# Patient Record
Sex: Male | Born: 1945 | Race: Black or African American | Hispanic: No | Marital: Single | Smoking: Never smoker
Health system: Southern US, Community
[De-identification: ages and names within clinical notes are randomized; demographics above are authoritative.]

## PROBLEM LIST (undated history)

## (undated) DIAGNOSIS — A0472 Enterocolitis due to Clostridium difficile, not specified as recurrent: Principal | ICD-10-CM

## (undated) DIAGNOSIS — Z992 Dependence on renal dialysis: Secondary | ICD-10-CM

## (undated) DIAGNOSIS — I441 Atrioventricular block, second degree: Secondary | ICD-10-CM

## (undated) DIAGNOSIS — I2510 Atherosclerotic heart disease of native coronary artery without angina pectoris: Secondary | ICD-10-CM

## (undated) DIAGNOSIS — M79606 Pain in leg, unspecified: Secondary | ICD-10-CM

## (undated) DIAGNOSIS — E11319 Type 2 diabetes mellitus with unspecified diabetic retinopathy without macular edema: Secondary | ICD-10-CM

## (undated) DIAGNOSIS — I442 Atrioventricular block, complete: Secondary | ICD-10-CM

## (undated) DIAGNOSIS — G8929 Other chronic pain: Secondary | ICD-10-CM

## (undated) DIAGNOSIS — I219 Acute myocardial infarction, unspecified: Secondary | ICD-10-CM

## (undated) DIAGNOSIS — M549 Dorsalgia, unspecified: Secondary | ICD-10-CM

## (undated) DIAGNOSIS — D696 Thrombocytopenia, unspecified: Secondary | ICD-10-CM

## (undated) DIAGNOSIS — K529 Noninfective gastroenteritis and colitis, unspecified: Secondary | ICD-10-CM

## (undated) DIAGNOSIS — I255 Ischemic cardiomyopathy: Secondary | ICD-10-CM

## (undated) DIAGNOSIS — E669 Obesity, unspecified: Secondary | ICD-10-CM

## (undated) DIAGNOSIS — I739 Peripheral vascular disease, unspecified: Secondary | ICD-10-CM

## (undated) DIAGNOSIS — N186 End stage renal disease: Secondary | ICD-10-CM

## (undated) DIAGNOSIS — I96 Gangrene, not elsewhere classified: Secondary | ICD-10-CM

## (undated) DIAGNOSIS — I10 Essential (primary) hypertension: Secondary | ICD-10-CM

## (undated) DIAGNOSIS — D649 Anemia, unspecified: Secondary | ICD-10-CM

## (undated) HISTORY — PX: ARTERIOVENOUS GRAFT PLACEMENT: SUR1029

## (undated) HISTORY — PX: EYE SURGERY: SHX253

## (undated) HISTORY — PX: PR VEIN BYPASS GRAFT,AORTO-FEM-POP: 35551

## (undated) HISTORY — DX: Obesity, unspecified: E66.9

## (undated) HISTORY — PX: AV FISTULA PLACEMENT: SHX1204

## (undated) HISTORY — DX: Enterocolitis due to Clostridium difficile, not specified as recurrent: A04.72

## (undated) HISTORY — DX: Peripheral vascular disease, unspecified: I73.9

## (undated) HISTORY — DX: Type 2 diabetes mellitus with unspecified diabetic retinopathy without macular edema: E11.319

## (undated) HISTORY — PX: ABOVE KNEE LEG AMPUTATION: SUR20

## (undated) HISTORY — PX: CATARACT EXTRACTION: SUR2

## (undated) HISTORY — DX: Essential (primary) hypertension: I10

---

## 2001-05-12 ENCOUNTER — Encounter: Payer: Self-pay | Admitting: Vascular Surgery

## 2001-05-14 ENCOUNTER — Encounter: Payer: Self-pay | Admitting: Vascular Surgery

## 2001-05-14 ENCOUNTER — Inpatient Hospital Stay (HOSPITAL_COMMUNITY): Admission: AD | Admit: 2001-05-14 | Discharge: 2001-06-08 | Payer: Self-pay | Admitting: Vascular Surgery

## 2001-05-14 ENCOUNTER — Encounter (INDEPENDENT_AMBULATORY_CARE_PROVIDER_SITE_OTHER): Payer: Self-pay | Admitting: *Deleted

## 2001-05-16 ENCOUNTER — Encounter: Payer: Self-pay | Admitting: Vascular Surgery

## 2003-05-29 ENCOUNTER — Encounter: Admission: RE | Admit: 2003-05-29 | Discharge: 2003-05-29 | Payer: Self-pay | Admitting: Vascular Surgery

## 2003-06-11 ENCOUNTER — Inpatient Hospital Stay (HOSPITAL_COMMUNITY): Admission: RE | Admit: 2003-06-11 | Discharge: 2003-06-17 | Payer: Self-pay | Admitting: Vascular Surgery

## 2003-06-13 ENCOUNTER — Encounter (INDEPENDENT_AMBULATORY_CARE_PROVIDER_SITE_OTHER): Payer: Self-pay | Admitting: Specialist

## 2005-03-31 ENCOUNTER — Ambulatory Visit: Payer: Self-pay | Admitting: Ophthalmology

## 2005-04-07 ENCOUNTER — Ambulatory Visit: Payer: Self-pay | Admitting: Ophthalmology

## 2006-03-23 ENCOUNTER — Ambulatory Visit: Payer: Self-pay | Admitting: Gastroenterology

## 2006-04-09 HISTORY — PX: COLONOSCOPY: SHX174

## 2006-05-05 ENCOUNTER — Ambulatory Visit: Payer: Self-pay | Admitting: Internal Medicine

## 2006-05-05 ENCOUNTER — Encounter (INDEPENDENT_AMBULATORY_CARE_PROVIDER_SITE_OTHER): Payer: Self-pay | Admitting: Specialist

## 2006-05-05 ENCOUNTER — Ambulatory Visit (HOSPITAL_COMMUNITY): Admission: RE | Admit: 2006-05-05 | Discharge: 2006-05-05 | Payer: Self-pay | Admitting: Internal Medicine

## 2006-10-05 ENCOUNTER — Encounter (HOSPITAL_COMMUNITY): Admission: RE | Admit: 2006-10-05 | Discharge: 2006-11-04 | Payer: Self-pay | Admitting: Nephrology

## 2006-10-05 ENCOUNTER — Ambulatory Visit (HOSPITAL_COMMUNITY): Payer: Self-pay | Admitting: Nephrology

## 2006-11-09 ENCOUNTER — Ambulatory Visit (HOSPITAL_COMMUNITY): Admission: RE | Admit: 2006-11-09 | Discharge: 2006-11-09 | Payer: Self-pay | Admitting: Vascular Surgery

## 2006-11-09 ENCOUNTER — Ambulatory Visit: Payer: Self-pay | Admitting: Vascular Surgery

## 2006-11-10 ENCOUNTER — Emergency Department (HOSPITAL_COMMUNITY): Admission: EM | Admit: 2006-11-10 | Discharge: 2006-11-10 | Payer: Self-pay | Admitting: Emergency Medicine

## 2006-12-31 ENCOUNTER — Emergency Department (HOSPITAL_COMMUNITY): Admission: EM | Admit: 2006-12-31 | Discharge: 2006-12-31 | Payer: Self-pay | Admitting: Emergency Medicine

## 2007-01-10 ENCOUNTER — Ambulatory Visit: Payer: Self-pay | Admitting: Vascular Surgery

## 2007-01-18 ENCOUNTER — Ambulatory Visit: Payer: Self-pay | Admitting: Vascular Surgery

## 2007-01-18 ENCOUNTER — Ambulatory Visit (HOSPITAL_COMMUNITY): Admission: RE | Admit: 2007-01-18 | Discharge: 2007-01-18 | Payer: Self-pay | Admitting: Vascular Surgery

## 2007-03-06 ENCOUNTER — Inpatient Hospital Stay (HOSPITAL_COMMUNITY): Admission: EM | Admit: 2007-03-06 | Discharge: 2007-03-10 | Payer: Self-pay | Admitting: Emergency Medicine

## 2007-03-09 ENCOUNTER — Other Ambulatory Visit: Payer: Self-pay | Admitting: Surgery

## 2007-03-09 ENCOUNTER — Ambulatory Visit: Payer: Self-pay | Admitting: Surgery

## 2007-03-21 ENCOUNTER — Ambulatory Visit (HOSPITAL_COMMUNITY): Admission: RE | Admit: 2007-03-21 | Discharge: 2007-03-21 | Payer: Self-pay | Admitting: Ophthalmology

## 2007-04-18 ENCOUNTER — Ambulatory Visit (HOSPITAL_COMMUNITY): Admission: RE | Admit: 2007-04-18 | Discharge: 2007-04-18 | Payer: Self-pay | Admitting: Ophthalmology

## 2007-05-15 ENCOUNTER — Emergency Department (HOSPITAL_COMMUNITY): Admission: EM | Admit: 2007-05-15 | Discharge: 2007-05-15 | Payer: Self-pay | Admitting: Emergency Medicine

## 2007-05-22 ENCOUNTER — Inpatient Hospital Stay (HOSPITAL_COMMUNITY): Admission: EM | Admit: 2007-05-22 | Discharge: 2007-05-30 | Payer: Self-pay | Admitting: Emergency Medicine

## 2007-05-25 ENCOUNTER — Ambulatory Visit: Payer: Self-pay | Admitting: Cardiology

## 2007-05-26 ENCOUNTER — Encounter (INDEPENDENT_AMBULATORY_CARE_PROVIDER_SITE_OTHER): Payer: Self-pay | Admitting: General Surgery

## 2007-05-30 ENCOUNTER — Inpatient Hospital Stay (HOSPITAL_COMMUNITY)
Admission: RE | Admit: 2007-05-30 | Discharge: 2007-06-09 | Payer: Self-pay | Admitting: Physical Medicine & Rehabilitation

## 2007-05-30 ENCOUNTER — Ambulatory Visit: Payer: Self-pay | Admitting: Physical Medicine & Rehabilitation

## 2007-06-30 ENCOUNTER — Emergency Department (HOSPITAL_COMMUNITY): Admission: EM | Admit: 2007-06-30 | Discharge: 2007-06-30 | Payer: Self-pay | Admitting: Emergency Medicine

## 2007-07-11 ENCOUNTER — Encounter
Admission: RE | Admit: 2007-07-11 | Discharge: 2007-07-12 | Payer: Self-pay | Admitting: Physical Medicine & Rehabilitation

## 2007-07-11 ENCOUNTER — Ambulatory Visit: Payer: Self-pay | Admitting: Physical Medicine & Rehabilitation

## 2007-07-12 ENCOUNTER — Encounter
Admission: RE | Admit: 2007-07-12 | Discharge: 2007-07-13 | Payer: Self-pay | Admitting: Physical Medicine & Rehabilitation

## 2007-07-19 ENCOUNTER — Ambulatory Visit (HOSPITAL_COMMUNITY): Admission: RE | Admit: 2007-07-19 | Discharge: 2007-07-19 | Payer: Self-pay | Admitting: Nephrology

## 2007-10-04 ENCOUNTER — Ambulatory Visit (HOSPITAL_COMMUNITY): Admission: RE | Admit: 2007-10-04 | Discharge: 2007-10-04 | Payer: Self-pay

## 2007-10-09 ENCOUNTER — Ambulatory Visit (HOSPITAL_COMMUNITY): Admission: RE | Admit: 2007-10-09 | Discharge: 2007-10-09 | Payer: Self-pay | Admitting: Nephrology

## 2007-11-17 ENCOUNTER — Encounter (HOSPITAL_COMMUNITY): Admission: RE | Admit: 2007-11-17 | Discharge: 2007-12-17 | Payer: Self-pay | Admitting: Internal Medicine

## 2007-12-20 ENCOUNTER — Encounter (HOSPITAL_COMMUNITY): Admission: RE | Admit: 2007-12-20 | Discharge: 2008-01-05 | Payer: Self-pay | Admitting: Internal Medicine

## 2008-07-01 ENCOUNTER — Emergency Department (HOSPITAL_COMMUNITY): Admission: EM | Admit: 2008-07-01 | Discharge: 2008-07-02 | Payer: Self-pay | Admitting: Emergency Medicine

## 2008-08-18 ENCOUNTER — Emergency Department (HOSPITAL_COMMUNITY): Admission: EM | Admit: 2008-08-18 | Discharge: 2008-08-18 | Payer: Self-pay | Admitting: Emergency Medicine

## 2008-08-23 ENCOUNTER — Ambulatory Visit (HOSPITAL_COMMUNITY): Admission: RE | Admit: 2008-08-23 | Discharge: 2008-08-23 | Payer: Self-pay | Admitting: Internal Medicine

## 2008-09-03 ENCOUNTER — Emergency Department (HOSPITAL_COMMUNITY): Admission: EM | Admit: 2008-09-03 | Discharge: 2008-09-03 | Payer: Self-pay | Admitting: Emergency Medicine

## 2008-10-09 ENCOUNTER — Encounter: Admission: RE | Admit: 2008-10-09 | Discharge: 2009-01-01 | Payer: Self-pay | Admitting: Internal Medicine

## 2009-11-17 ENCOUNTER — Encounter: Admission: RE | Admit: 2009-11-17 | Discharge: 2010-02-02 | Payer: Self-pay | Admitting: Internal Medicine

## 2010-02-08 ENCOUNTER — Emergency Department (HOSPITAL_COMMUNITY): Admission: EM | Admit: 2010-02-08 | Discharge: 2010-02-08 | Payer: Self-pay | Admitting: Emergency Medicine

## 2010-03-19 ENCOUNTER — Ambulatory Visit
Admission: RE | Admit: 2010-03-19 | Discharge: 2010-03-19 | Payer: Self-pay | Source: Home / Self Care | Admitting: Internal Medicine

## 2010-04-21 ENCOUNTER — Emergency Department (HOSPITAL_COMMUNITY)
Admission: EM | Admit: 2010-04-21 | Discharge: 2010-04-21 | Payer: Self-pay | Source: Home / Self Care | Admitting: Emergency Medicine

## 2010-07-20 LAB — BASIC METABOLIC PANEL
Calcium: 10 mg/dL (ref 8.4–10.5)
Creatinine, Ser: 7.49 mg/dL — ABNORMAL HIGH (ref 0.4–1.5)

## 2010-07-23 LAB — DIFFERENTIAL
Eosinophils Absolute: 0.1 10*3/uL (ref 0.0–0.7)
Lymphocytes Relative: 15 % (ref 12–46)
Lymphs Abs: 1.4 10*3/uL (ref 0.7–4.0)
Monocytes Relative: 7 % (ref 3–12)
Neutrophils Relative %: 77 % (ref 43–77)

## 2010-07-23 LAB — COMPREHENSIVE METABOLIC PANEL
Albumin: 3.3 g/dL — ABNORMAL LOW (ref 3.5–5.2)
Alkaline Phosphatase: 88 U/L (ref 39–117)
BUN: 45 mg/dL — ABNORMAL HIGH (ref 6–23)
Potassium: 5.2 mEq/L — ABNORMAL HIGH (ref 3.5–5.1)
Sodium: 135 mEq/L (ref 135–145)
Total Protein: 7.4 g/dL (ref 6.0–8.3)

## 2010-07-23 LAB — URINE CULTURE
Culture  Setup Time: 201110022030
Culture: NO GROWTH

## 2010-07-23 LAB — URINALYSIS, ROUTINE W REFLEX MICROSCOPIC
Bilirubin Urine: NEGATIVE
Glucose, UA: 250 mg/dL — AB
Protein, ur: 100 mg/dL — AB
Urobilinogen, UA: 0.2 mg/dL (ref 0.0–1.0)

## 2010-07-23 LAB — CBC
MCHC: 33.2 g/dL (ref 30.0–36.0)
RDW: 14.7 % (ref 11.5–15.5)

## 2010-07-23 LAB — GLUCOSE, CAPILLARY: Glucose-Capillary: 206 mg/dL — ABNORMAL HIGH (ref 70–99)

## 2010-07-23 LAB — URINE MICROSCOPIC-ADD ON

## 2010-08-19 LAB — CBC
HCT: 32.7 % — ABNORMAL LOW (ref 39.0–52.0)
Hemoglobin: 10.9 g/dL — ABNORMAL LOW (ref 13.0–17.0)
MCHC: 33.5 g/dL (ref 30.0–36.0)
Platelets: 117 10*3/uL — ABNORMAL LOW (ref 150–400)
RDW: 14.7 % (ref 11.5–15.5)

## 2010-08-19 LAB — COMPREHENSIVE METABOLIC PANEL
Albumin: 3.3 g/dL — ABNORMAL LOW (ref 3.5–5.2)
BUN: 48 mg/dL — ABNORMAL HIGH (ref 6–23)
Calcium: 9.5 mg/dL (ref 8.4–10.5)
Glucose, Bld: 136 mg/dL — ABNORMAL HIGH (ref 70–99)
Potassium: 4.4 mEq/L (ref 3.5–5.1)
Sodium: 142 mEq/L (ref 135–145)
Total Protein: 6.7 g/dL (ref 6.0–8.3)

## 2010-08-19 LAB — DIFFERENTIAL
Lymphocytes Relative: 12 % (ref 12–46)
Lymphs Abs: 1.1 10*3/uL (ref 0.7–4.0)
Monocytes Absolute: 0.4 10*3/uL (ref 0.1–1.0)
Monocytes Relative: 4 % (ref 3–12)
Neutro Abs: 8 10*3/uL — ABNORMAL HIGH (ref 1.7–7.7)
Neutrophils Relative %: 83 % — ABNORMAL HIGH (ref 43–77)

## 2010-08-19 LAB — URINALYSIS, ROUTINE W REFLEX MICROSCOPIC
Bilirubin Urine: NEGATIVE
Glucose, UA: 100 mg/dL — AB
Specific Gravity, Urine: 1.01 (ref 1.005–1.030)
Urobilinogen, UA: 0.2 mg/dL (ref 0.0–1.0)
pH: 6.5 (ref 5.0–8.0)

## 2010-08-19 LAB — URINE MICROSCOPIC-ADD ON

## 2010-09-22 NOTE — Op Note (Signed)
NAMESANDFORD, DIOP NO.:  [id number]   MEDICAL RECORD NO.:  [id number]          PATIENT TYPE:  INP   LOCATION:  A332                          FACILITY:  [facility]   PHYSICIAN:  Dalia Heading, M.D.  DATE OF BIRTH:  06-18-1945   DATE OF PROCEDURE:  05/26/2007  DATE OF DISCHARGE:                               OPERATIVE REPORT   PREOPERATIVE DIAGNOSIS:  Gangrene, right foot.   POSTOPERATIVE DIAGNOSIS:  Gangrene, right foot.   PROCEDURE:  Right above-the-knee amputation.   SURGEON:  Dalia Heading, MD   ANESTHESIA:  Spinal.   INDICATIONS:  The patient is a 65 year old black male with multiple  medical problems including peripheral vascular disease and end-stage  renal disease as well as diabetes mellitus, who presents with a  gangrenous right foot.  He has been on a long course of vancomycin in  the past.  He is blood culture-positive for MRSA.  The patient now comes  to the operating room for a right above-the-knee amputation.  The risks  and benefits of the procedure including bleeding, infection,  cardiopulmonary difficulties, and the possible blood transfusion, were  fully explained to the patient, who gave informed consent.   PROCEDURE NOTE:  The patient was placed in the supine position after  spinal anesthesia was administered.  The right lower extremity was  prepped and draped using the usual sterile technique with Betadine.  Surgical site confirmation was performed.   A fishmouth incision was made above the right knee.  This was taken down  to the femur.  The femur was divided using a power saw.  The deep  femoral artery and vein were identified and individually ligated using 0  silk suture ligatures.  The sciatic nerve was also found and was ligated  using an 0 silk ligature.  The posterior soft tissue was then divided  using Bovie electrocautery and the right lower extremity was removed  from the operative field and sent to pathology for further  examination.  Any bleeding was controlled using 2-0 Vicryl suture ligatures as well as  Bovie electrocautery.  The distal femur was rasped and then bone wax was  applied.  The soft tissue was placed over the distal femur using a  figure-of-eight 0 silk suture.  The anterior and posterior fascial edges  were reapproximated using 2-0 Vicryl interrupted sutures.  The  subcutaneous layer was irrigated with normal saline and the skin was  closed using staples.  Betadine ointment and a dry sterile dressing were  applied.   All tape and needle counts were correct at the end of the procedure.  The patient was transferred to PACU in stable condition.   COMPLICATIONS:  None.   SPECIMEN:  Right lower extremity.   BLOOD LOSS:  110 mL      Dalia Heading, M.D.  Electronically Signed     MAJ/MEDQ  D:  05/26/2007  T:  05/26/2007  Job:  [id number]   cc:   Tesfaye D. Felecia Shelling, MD  Fax: [phone]   Jorja Loa, M.D.  Fax: [phone]

## 2010-09-22 NOTE — Consult Note (Signed)
NAMEREGIS, HINTON NO.:  [id number]   MEDICAL RECORD NO.:  [id number]          PATIENT TYPE:  IPS   LOCATION:  4008                         FACILITY:  [facility]   PHYSICIAN:  Cecille Aver, M.D.DATE OF BIRTH:  February 08, 1946   DATE OF CONSULTATION:  DATE OF DISCHARGE:                                 CONSULTATION   REFERRING PHYSICIAN:  Ellwood Dense, MD   REASON FOR CONSULTATION:  Management of dialysis and other related  medical issues.   HISTORY OF PRESENT ILLNESS:  [name] is a 65 year old black male  with past medical history significant for longstanding diabetes  mellitus, as well as hypertension.  He has now progressed to end-stage  renal failure on dialysis, Tuesday, Thursday, Saturday, followed by Dr.  Kristian Covey.  The patient was initially admitted to [facility]  with nausea, vomiting and fever.  Blood cultures later grew out Staph  aureus, which was attributed to a foot ulcer.  The patient eventually  required AKA for the nonhealing foot ulcer, and is currently on  Nafcillin for an MSSA bacteremia.  He continued to undergo his chronic  hemodialysis on Tuesday, Thursday, Saturday; the last being today, which  is Tuesday, May 30, 2007.  He is transferred to [facility] for  rehabilitation after AKA.  He is essentially without complaints and  denies any difficulties with his dialysis of late.   PAST MEDICAL HISTORY:  1. End-stage renal disease on dialysis Tuesday, Thursday, Saturday via      an AV graft.  He reports he has been on dialysis for about 5-1/2      months.  There are no really any issues.  2. Diabetes mellitus which is longstanding and poorly controlled.  3. Hypertension which is also longstanding, but appears to be under      good control now.  4. Status post amputation as noted above for nonhealing foot ulcer.  5. Anemia.  6. Secondary hyperparathyroidism.   CURRENT MEDICATIONS:  1. Diltiazem 360 mg a day.  2.  Hydralazine 50 mg t.i.d.  3. Lantus 10 units at night.  4. NovoLog sliding scale.  5. Nafcillin 2 g q.6 h.  6. Sodium bicarb 650 b.i.d.  7. P.r.n. pain medications.   ALLERGIES:  NO KNOWN DRUG ALLERGIES.   SOCIAL HISTORY:  The patient previously lived in [place] by himself.  He denies any alcohol, tobacco, or drug use.  It is unclear if he will  be able to return home after this amputation.   FAMILY HISTORY:  Negative for renal disease.   REVIEW OF SYSTEMS:  Only positive for some weakness and some right stump  pain.  He denies any fevers, chills, night sweats, headache, shortness  of breath, dyspnea on exertion, lower extremity edema, nausea and  vomiting, diarrhea, constipation, skin lesions.  All other review of  systems is negative.   PHYSICAL EXAMINATION:  Temperature is 98.  Pulse of 89.  Blood pressure  114/69.  Oxygen saturation is 98% on room air.  In general, this is a well-developed black male who is alert and in no  acute distress.  HEENT:  The patient has poor dentition and possibly some kind of speech  impediment.  Mucous membranes are moist.  NECK:  There is no jugular venous distension or lymphadenopathy.  LUNGS:  Clear to auscultation bilaterally.  CARDIOVASCULAR:  Regular rate and rhythm.  ABDOMEN:  Positive bowel sounds, soft, nontender.  EXTREMITIES:  Reveal no clubbing or cyanosis, or edema on the left.  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  There is an AV graft in the  right upper arm with a good thrill and bruit.   There have been no recent labs.  Last BMP was done on May 27, 2007,  revealed a potassium level of 4.5 and CBC with a hemoglobin of 8.9.   ASSESSMENT:  A 65 year old black male with end-stage renal disease who  is status post recent above-knee amputation, who transfers here for  rehabilitation.   1. Status post above-knee amputation, transferred here for      rehabilitation.  This will be per the rehabilitation physicians.   2. End-stage renal disease on dialysis.  The patient will continue on      his Tuesday, Thursday, Saturday schedule.  Usually dialysis is      undertaken in the afternoon for the rehab patients so they do not      miss out on any of their exercises.  We will continue with the same      dry weight and orders, and change them as needed according to the      patient's needs.  3. Diabetes mellitus:  This will be managed by the rehabilitation      physicians.  Will check some frequent blood sugars and titrate      insulin accordingly.  4. Hypertension:  The patient does not have any current hypertension.      In dialysis today his blood pressure was higher.  We will titrate      dry weight and blood pressure medication as required.  5. Anemia:  The patient is only on 8800 of Epogen with dialysis.  His      hemoglobin has dropped since he has been in the hospital, likely      surgically related.  Will increase Epogen.  6. Secondary hyperparathyroidism.  Will continue Zemplar.  7. The patient is not currently on binders.  Will check phosphorus for      binder need and will restart Nephrovite.           ______________________________  Cecille Aver, M.D.     KAG/MEDQ  D:  05/30/2007  T:  05/30/2007  Job:  [id number]

## 2010-09-22 NOTE — Op Note (Signed)
Julian [name], Julian [name]             ACCOUNT NO.:  [id number]   MEDICAL RECORD NO.:  [id number]          PATIENT TYPE:  AMB   LOCATION:  SDS                          FACILITY:  [facility]   PHYSICIAN:  Charles E. Fields, MD  DATE OF BIRTH:  09/21/1945   DATE OF PROCEDURE:  11/09/2006  DATE OF DISCHARGE:  11/09/2006                               OPERATIVE REPORT   PROCEDURE:  1. Ultrasound of neck.  2. Right internal jugular vein Diatek catheter.   PREOPERATIVE DIAGNOSIS:  Renal failure.   POSTOPERATIVE DIAGNOSIS:  Renal failure.   ANESTHESIA:  Local with IV sedation.   OPERATIVE FINDINGS:  A 23-cm Diatek catheter, right internal jugular  vein.   OPERATIVE DETAILS:  After obtaining informed consent, the patient was  taken to the operating room.  The patient was placed in the supine  position on the operating table.  After adequate sedation, the patient's  neck was inspected with an ultrasound.  The right internal jugular vein  was found to have normal compressibility and respiratory variation.  Next, the patient's entire neck and chest were prepped and draped in  usual sterile fashion.  Local anesthesia was infiltrated over the right  internal jugular vein.  A small finder needle was used to localize the  right internal jugular vein.  A larger introducer needle was used to  cannulate the right internal jugular vein, and a 0.035 J-tip guide wire  threaded through the right internal jugular vein, down into the inferior  vena cava under fluoroscopic guidance.  Next, sequential 12, 14, 16-  [place] dilators with peel-away sheath were placed over the guidewire  into the right atrium.  The guidewire and dilator were removed, and a 23-  cm Diatek catheter was placed through the peel-away sheath down in the  right atrium.  Peel-away sheath was then removed.  The catheter was  tunneled subcutaneously, cut to length and the hub attached.  The  catheter was noted to flush and draw easily.   Catheter was inspected  under fluoroscopy and found to have no kinks throughout its course and  the tip in the right atrium.  The catheter was sutured to skin with  nylon sutures.  The neck insertion site was closed with a Vicryl stitch.  The patient tolerated the procedure well, and there were no  complications.  Instrument, sponge, and needle counts were correct at  end of the case.  The patient was taken to the recovery room in stable  condition.      Janetta Hora. Fields, MD  Electronically Signed     CEF/MEDQ  D:  11/09/2006  T:  11/09/2006  Job:  [id number]

## 2010-09-22 NOTE — Assessment & Plan Note (Signed)
HISTORY:  Mr. Giovanelli returns to the clinic today for follow-up  evaluation.  He is a 65 year old adult male with a history of end-stage  renal disease, secondary to diabetes mellitus, along with peripheral  vascular disease.  He has had a history of a right heel ulcer with  cellulitis and osteomyelitis.   The patient was admitted to [facility] on May 22, 2007,  with gangrenous changes of his right heel with __________ coccus.  He  was treated with IV vancomycin  for an attempted limb salvage.  There  was no improvement and he subsequently underwent a right below-the-knee  amputation on May 26, 2007, by Dr. Dalia Heading.  The patient was  on the rehabilitation unit from May 30, 2007, through June 09, 2007.   The patient was seen in the office today for the consideration of a  prosthesis.  Since being discharged from the hospital, the patient  apparently has had a revision to an above-the-knee amputation on the  right side.  I looked in the computer and found no medical records to  document that revision.  In any event, he has an above-the-knee  amputation on the right side at this time.  He lives in a single-level  home by himself.  He ambulates or hops on his left leg with a walker.  He is independent for bathing and dressing.  He does have hemodialysis  every Tuesday, Thursday and Saturday.   MEDICATIONS:  1. Calcium acetate 667 mg, two tab three times daily.  2. Fosrenol 500 mg, two tab three times daily.  3. Cardizem CD 360 mg daily.  4. Sensipar 30 mg daily.  5. Hydralazine 50 mg three times daily.  6. Sodium bicarbonate 650 mg twice daily.  7. Rena-Vite one tab daily.  8. Insulin, unknown dose daily.   REVIEW OF SYSTEMS:  Noncontributory.   PHYSICAL EXAMINATION:  GENERAL:  An ill-appearing, elderly adult male  seated in a manual wheelchair.  VITAL SIGNS:  Blood pressure 149/83, pulse 84, respirations 18, O2  saturation 98% on room air.   NEUROLOGIC:  He is able to stand with a  regular walker and hops on his left lower extremity.  He has good  balance overall.  He has slight hip flexion contracture on the right  lower extremity of 10 degrees at the hip.  He has strength of 5/5  throughout the bilateral upper and lower extremities.  Examination of  his above-the-knee amputation on the right side shows excellent healing,  with only two areas of slight indentation measuring approximately 1 mm x  1 mm each.  The patient apparently had some retained staples at one of  the sites, and that was removed recently.   IMPRESSION:  1. Status post right above-the-knee amputation, secondary to      peripheral vascular disease/diabetes mellitus.  2. End-stage renal disease.  3. Hypertension.  4. Diabetic retinopathy.  5. Type 2 diabetes mellitus.   PLAN:  In the office today we did set the patient up to get shrinkers  today at [employer].  He will be using the shrinkers daily for the next two  months and then see Kathlene November at [facility] for eventual casting of his above-  the-knee prosthesis.  Once he has the prosthesis in place, we will try  to set up either home health therapies for him up in [place] or  outpatient therapies if he has the transportation.  Unfortunately that  is a bit of an  issue at this point.  They are already taking him in for  dialysis three times per week.  Will try to set up whatever therapy we  can at that point.   FOLLOWUP:  I will plan to see him in followup on an as-needed basis.           ______________________________  Ellwood Dense, M.D.     DC/MedQ  D:  07/12/2007 11:27:20  T:  07/12/2007 12:24:00  Job #:  [id number]

## 2010-09-22 NOTE — Procedures (Signed)
NAMECAISON, HEARN NO.:  [id number]   MEDICAL RECORD NO.:  [id number]          PATIENT TYPE:  INP   LOCATION:  A332                          FACILITY:  [facility]   PHYSICIAN:  Gerrit Friends. Dietrich Pates, MD, FACCDATE OF BIRTH:  04-28-1946   DATE OF PROCEDURE:  05/25/2007  DATE OF DISCHARGE:  05/30/2007                                ECHOCARDIOGRAM   REFERRING PHYSICIAN:  Tesfaye D. Felecia Shelling, M.D.   CLINICAL DATA:  A 65 year old gentleman with sepsis.   M-mode:  Aorta 3.1, left atrium 4.4, septum 1.6, posterior wall 1.6, LV  diastole 3.9, LV systole 2.8.   1. Technically somewhat suboptimal but adequate echocardiographic      study.  2. Normal left atrium, right atrium, and right ventricle.  3. Normal aortic, tricuspid, mitral, and pulmonic valves.  4. Mild enlargement of the proximal pulmonary artery.  5. Normal left ventricular size; moderate concentric hypertrophy;      normal regional and global function.  6. Normal IVC.  7. Physiologic pericardial effusion.      Gerrit Friends. Dietrich Pates, MD, [facility]  Electronically Signed     RMR/MEDQ  D:  06/06/2007  T:  06/06/2007  Job:  [id number]

## 2010-09-22 NOTE — Op Note (Signed)
Julian [name], RION NO.:  [id number]   MEDICAL RECORD NO.:  [id number]          PATIENT TYPE:  INP   LOCATION:  A215                          FACILITY:  [facility]   PHYSICIAN:  Tilford Pillar, MD      DATE OF BIRTH:  1945/09/19   DATE OF PROCEDURE:  03/07/2007  DATE OF DISCHARGE:                               OPERATIVE REPORT   PREOPERATIVE DIAGNOSIS:  Suspected infected PermCath in right internal  jugular vein.   POSTOPERATIVE DIAGNOSIS:  Suspected infected PermCath in right internal  jugular vein.   PROCEDURE:  Removal of tunneled central venous catheter at bedside.   SURGEON:  Tilford Pillar, M.D.   ANESTHESIA:  1% lidocaine.   ESTIMATED BLOOD LOSS:  Minimal.   INDICATIONS FOR PROCEDURE:  The patient is a 65 year old male with a  history of end-stage renal disease.  He had a previously placed tunneled  PermCath into his right internal jugular vein.  This was tunneled over  the clavicle.  There was a palpable cuff within the deep subcuticular  tissue.  The patient had presented to [facility] with  recurrent fevers and chills with the suspicion of line infection.  He  had prior dialysis through a right AV fistula which he tolerated well.  At this point, it was discussed with the patient removal of the PermCath  at the bedside.  Questions and concerns were addressed, consented, and  the patient was prepared for the patient.   DESCRIPTION OF PROCEDURE:  The patient was transferred back to his bed.  He was placed in a supine position at which time the initial sutures  holding the PermCath were removed.  At this point, his right neck and  shoulder were prepped with Betadine solution.  The local anesthetic was  injected.  It was noted during the removal of the sutures, he did have  what appeared to be a keloid formation at the insertion site of the  catheter at the level of the skin.  This had a buried suture within it  and needed to be divided  in order to remove the suture.  Additionally,  at this point the drapes were brought to the field and using a Hemostat  to bluntly dissect along the tract of the catheter up to the cuff with  general retraction onto the catheter and holding pressure at the venous  insertion site in the neck, the catheter was gently pulled until the  cuff was freed from the subcutaneous tissue thus allowing the catheter  to be pulled with ease out of the tunnel.  The tips of the catheter were  clipped and placed into a sterile specimen container.  These were sent for cultures to the lab.  Additionally at this point  pressure was continued to be held along the internal jugular insertion  site for 10 minutes by myself at which point the hemostasis was noted to  be good.  The sterile dressing was placed.  The patient tolerated the  procedure well.      Tilford Pillar, MD  Electronically Signed     BZ/MEDQ  D:  03/08/2007  T:  03/08/2007  Job:  [id number]   cc:   Mila Homer. Sudie Bailey, M.D.  Fax: [phone]

## 2010-09-22 NOTE — Consult Note (Signed)
NAMECEPHUS, TUPY             ACCOUNT NO.:  [id number]   MEDICAL RECORD NO.:  [id number]          PATIENT TYPE:  INP   LOCATION:  A332                          FACILITY:  [facility]   PHYSICIAN:  Jorja Loa, M.D.DATE OF BIRTH:  January 07, 1946   DATE OF CONSULTATION:  DATE OF DISCHARGE:                                 CONSULTATION   REASON FOR CONSULTATION:  End-stage renal disease for hemodialysis.   HISTORY OF PRESENT ILLNESS:  This is a 65 year old African American with  past medical history of hypertension, diabetes, severe peripheral  vascular disease.  Presently admitted because of fever, chills, sweating  and osteomyelitis.  Presently he is feeling better.  He does not have  any nausea or vomiting but he complains of some weakness.  Since the  patient is due for dialysis today a consult is called.   PAST MEDICAL HISTORY:  As stated above along with history of diabetes,  history of hypertension, history of severe peripheral vascular disease,  history of CHF, progressive history of diabetic retinopathy, status post  laser treatment, history of end-stage renal disease on maintenance  hemodialysis Tuesday, Thursday and Saturday, history of recurrent  osteomyelitis.  He has multiple toe removals and he has been on  antibiotics without significant improvement.   CURRENT MEDICATIONS:  His medication at this moment consist of diltiazem  360 mg p.o. daily, hydralazine 50 mg p.o. t.i.d., Novolin insulin, Zosyn  2.5 mg every 12 hours and he is also on Zofran and has received  vancomycin 1 gram IV.   ALLERGIES:  NO KNOWN ALLERGY.   SOCIAL HISTORY:  No history of alcohol abuse.  No history of smoking.   REVIEW OF SYSTEMS:  GENERAL:  When he came here has some nausea or  vomiting.  Presently he denies.  Denies any shortness of breath,  dizziness or lightheadedness.  His appetite is __________ .  He has some  weakness.  Otherwise at this moment, no orthopnea, paroxysmal nocturnal  dyspnea and also no chills.  However, when he came, he was having fever  and chills.  VITAL SIGNS:  His temperature today is 100.2.  Pulse of 97, respiratory  rate is 20, blood pressure is 115/40.  CHEST:  Chest is clear to auscultation.  No rales or rhonchi.  No  egophony.  HEART:  His heart exam revealed regular rate and rhythm.  No murmur.  ABDOMEN:  Soft, positive bowel sounds.  EXTREMITIES:  He does not have any edema.   LABORATORY DATA:  His white blood cell count is 17.1, hemoglobin 10.5,  hematocrit 32.2.  Sodium 126, potassium 4.7, BUN 76, creatinine 7.7 and  albumin of 1.6.  Blood culture at this moment is pending.   ASSESSMENT:  1. End-stage status post hemodialysis.  On __________ BUN and      creatinine within acceptable range __________ .  The patient is due      for dialysis today.  2. Diabetes.  He is on insulin.  3. History of hypertension.  He is on antihypertensive medication.      Pressure seems to be controlled very well.  4.  History of cellulitis, osteomyelitis, nonhealing ulcer.  He has      been seen by a podiatrist as an outpatient.  Has also completed      previous antibiotics.  His wound has been dressed and changed.      However at this moment seems to be nonhealing ulcer.  Dr. Lovell Sheehan      __________ is at this moment on the case.  5. History of anemia.  He is on Epogen.  Hemoglobin and hematocrit      stable.  6. History of congestive heart failure.  No sign of fluid overload.   RECOMMENDATIONS:  We will make arrangement for him to get dialysis  today.  I will give him another dose of vancomycin post dialysis and  will follow the patient.      Jorja Loa, M.D.  Electronically Signed     BB/MEDQ  D:  05/23/2007  T:  05/23/2007  Job:  [id number]

## 2010-09-22 NOTE — Op Note (Signed)
NAMEHORACE, Julian [name] NO.:  [id number]   MEDICAL RECORD NO.:  [id number]          PATIENT TYPE:  AMB   LOCATION:  SDS                          FACILITY:  [facility]   PHYSICIAN:  Di Kindle. [name], M.D.DATE OF BIRTH:  [birth year]   DATE OF PROCEDURE:  01/18/2007  DATE OF DISCHARGE:  01/18/2007                               OPERATIVE REPORT   PREOPERATIVE DIAGNOSIS:  Chronic renal failure.   POSTOPERATIVE DIAGNOSIS:  Chronic renal failure.   PROCEDURE:  Placement of new right upper arm arteriovenous fistula.   SURGEON:  Di Kindle. [name], M.D.   ASSISTANT:  Emilio Aspen, RNFA   ANESTHESIA:  Local with sedation.   TECHNIQUE:  The patient was taken to the operating sedated by  anesthesia.  The right upper extremity was prepped and draped in usual  sterile fashion.  After the skin was infiltrated with 1% lidocaine, the  transverse incision was made just above the antecubital level.  Here the  cephalic vein was dissected free.  It was marginally sized.  I ligated  it distally and it did irrigate up reasonably well with heparinized  saline.  The brachial artery was dissected free beneath the fascia.  The  patient was then heparinized.  There was some calcium in the artery.  The artery was little bit small.  The brachial artery was then clamped  proximally and distally, a longitudinal arteriotomy was made.  The vein  was mobilized over and sewn end-to-side to the brachial artery using  continuous 6-0 Prolene suture.  At completion was a good thrill in the  fistula.  Hemostasis was obtained in the wound.  The wound was closed  with deep layer of 3-0 Vicryl.  The skin closed with 4-0 Vicryl.  Sterile dressing was applied.  The patient tolerated the procedure well,  was transferred to recovery room in satisfactory condition.  All needle  and sponge counts were correct.      Di Kindle. [name], M.D.  Electronically Signed     CSD/MEDQ  D:   01/18/2007  T:  01/19/2007  Job:  [id number]

## 2010-09-22 NOTE — H&P (Signed)
Julian [name], SEVEY NO.:  [id number]   MEDICAL RECORD NO.:  [id number]          PATIENT TYPE:  IPS   LOCATION:  4008                         FACILITY:  [facility]   PHYSICIAN:  Ranelle Oyster, M.D.DATE OF BIRTH:  Jun 24, 1945   DATE OF ADMISSION:  05/30/2007  DATE OF DISCHARGE:                              HISTORY & PHYSICAL   CHIEF COMPLAINT:  Left leg pain.   HISTORY OF PRESENT ILLNESS:  This is a pleasant 65 year old black male  with end-stage renal disease and severe peripheral vascular disease of  the right leg.  The patient had progressive cellulitis and  osteomyelitis.  He was admitted to [facility] on 1/12 with  gangrene in the right heel and fever.  Cultures were positive for  Staphylococcus aureus.  He was treated with IV vancomycin.  Ultimately,  the limb could not be salvaged, and he underwent a right above-knee  amputation on 1/16 by Dr. Franky Macho.  He has been switched over the  nafcillin for treatment of his staph bacteremia.  Lantus was added for  better blood sugar control.  The patient continues to struggle with  mobility and self-care and thus was admitted to the inpatient  rehabilitation unit today.   REVIEW OF SYSTEMS:  Notable for wound issues.  He has had some pain  still at the right thigh and wound site.  He denies fever or chills,  shortness of breath.  He has had an occasional cough.  He has been  sleeping fairly well.  Appetite has been fair.  His bowels have been  somewhat regular.  Full review of systems is in the written H&P.   PAST MEDICAL HISTORY:  Positive for:  1. Hypertension.  2. Diabetes.  3. End-stage renal disease secondary to diabetes.  4. History of AV graft to right upper extremity.  5. Peripheral vascular disease.  6. Diabetic retinopathy status post laser treatment within the last 4-      6 weeks.  7. History of recurrent osteomyelitis with prior amputations in the      left foot.  8. He has had  revascularization procedures also in the left leg.  9. Anemia of chronic disease.   FAMILY HISTORY:  Noncontributory.   SOCIAL HISTORY:  The patient lives alone.  He has a sister who lives  nearby as well as a niece who can help him, apparently.   FUNCTIONAL HISTORY:  The patient was independent with a cane prior to  arrival.  Currently, he is requiring moderate assistance for basic  mobility and self-care.   ALLERGIES:  None.   HOME MEDICATIONS:  1. Cardizem CD 360 mg daily.  2. Hydralazine 50 mg t.i.d.  3. Actos 30 mg daily.  4. Lasix 20 mg daily.  5. Sodium bicarb 650 mg b.i.d.   LABORATORY:  Hemoglobin 8.9, white count 10.1, platelets 304,000.  Sodium 128, potassium 4.5.  BUN and creatinine 41 and 7.4.   PHYSICAL EXAMINATION:  GENERAL:  The patient is pleasant, in no acute  distress.  He is alert and oriented x3.  VITAL SIGNS:  Blood pressure 114/69,  pulse 89, respiratory rate 20,  temperature 98.  HEENT:  Pupils are equal, round and reactive to light.  Ears, nose and  throat exam is notable for poor dentition.  Oral mucosa is pink and  moist.  NECK:  Supple without JVD or lymphadenopathy.  CHEST:  Clear to auscultation bilaterally without wheezes, rales or  rhonchi.  HEART:  Regular rate and rhythm without murmur, rub or gallop.  ABDOMEN:  Soft and nontender.  EXTREMITIES:  No clubbing or cyanosis.  Right upper extremity AV graft  appeared to be functioning.  Pulses were 2+ at the wrist.  Right above-  knee amputation site is clean and intact with no signs of drainage.  This area was appropriately tender and swollen today.  He did have some  active hip flexion and extension of 2/5 approximately.  Left upper  extremity use was generally 3+ to 4+/5.  Right upper extremity use was  5/5.  NEUROLOGIC:  On cranial nerve examination, cranial nerves II-XII were  intact with fair visual acuity.  Reflexes were 1+.  Sensation was  decreased distally in the limbs, lower and  upper.  Judgment,  orientation, memory and mood appeared to be age and socially  appropriate.   ASSESSMENT/PLAN:  1. Functional deficit secondary to gangrene and osteomyelitis in the      right foot requiring above-knee amputation.  The patient is      postoperative day number 4 today.  Begin comprehensive inpatient      rehab with PT to assess and treat for range of motion,      strengthening, transfers and pre-gait training as appropriate.      Goals will be mostly at the wheelchair level with PT.  OT will      assess and treat for range of motion, strengthening, ADLs and      equipment as appropriate.  Rehab nursing will following on a 24-      hour basis with bowel and bladder, skin, medication and safety      issues.  Rehab case manager/social worker for his psychosocial      needs and discharge planning.  Estimated length of stay is 7-10      days depending on supervision at home.  Goals ideally would be      supervision although he may need to be intermittently modified      independent.  2. Diabetes.  Continue Lantus insulin as advised.  We will his blood      glucose at a.c. and h.s.  Actos is also on board at 30 mg daily.  3. End-stage renal disease.  [facility] will assist      with dialysis.  4. Infectious disease.  Continue nafcillin 2 grams IV q.6 h. for 10      more days.  5. Blood pressure control.  Continue hydralazine and Cardizem.      Observe blood pressures routinely here in rehab.  6. Pain management.  Lortab p.r.n. for breakthrough pain symptoms.  7. Anemia.  Continue Epogen with dialysis.  8. Skin.  Continue local care to the right leg wound as well as      prophylaxis to the left heel.  PRAFO boot for left heel on loading.      He may be someone who would benefit from special diabetic shoes to      relieve contact on the affected limb.  He is high risk for further      vascular complications.  Ranelle Oyster, M.D.  Electronically  Signed    ZTS/MEDQ  D:  05/30/2007  T:  05/30/2007  Job:  [id number]

## 2010-09-22 NOTE — Group Therapy Note (Signed)
Julian [name], WILSON NO.:  [id number]   MEDICAL RECORD NO.:  [id number]          PATIENT TYPE:  INP   LOCATION:  A215                          FACILITY:  [facility]   PHYSICIAN:  Mila Homer. Sudie Bailey, M.D.DATE OF BIRTH:  1945/08/26   DATE OF PROCEDURE:  DATE OF DISCHARGE:                                 PROGRESS NOTE   SUBJECTIVE:  He really feels a lot better today.  Dr. Lovell Sheehan removed  his venous catheter yesterday.   OBJECTIVE:  Temperature is 99, pulse 88, respiratory rate 16, blood  pressure 125/68.  He is oriented and alert, in no acute distress, well-  developed and well-nourished.  LUNGS:  Clear throughout, moving air  well.  No intercostal retraction, no use of accessory muscles of  respiration.  HEART:  Regular rhythm with a rate of about 90.  ABDOMEN:  Benign without organomegaly or mass and there is no edema of the ankles.   Glucoses is 247 most recently and O2 SAT is 97%.   Both his blood cultures show gram-positive cocci in clusters but further  identification is still pending.   ASSESSMENT:  1. Septicemia.  2. Gram-positive cocci bacteremia  3. Chronic renal insufficiency.  4. Diabetes.   PLAN:  I discussed with Dr. Kristian Covey at length yesterday.  The patient  will be seen by Dr. [name] down in [place] to see if his graft is  mature enough to use it for dialysis.  Otherwise, continue the  vancomycin IV.      Mila Homer. Sudie Bailey, M.D.  Electronically Signed     SDK/MEDQ  D:  03/08/2007  T:  03/08/2007  Job:  [id number]

## 2010-09-22 NOTE — Discharge Summary (Signed)
NAMEMASSIMO, HARTLAND             ACCOUNT NO.:  [id number]   MEDICAL RECORD NO.:  [id number]          PATIENT TYPE:  INP   LOCATION:  A332                          FACILITY:  [facility]   PHYSICIAN:  Tesfaye D. Felecia Shelling, MD   DATE OF BIRTH:  07-17-1945   DATE OF ADMISSION:  05/22/2007  DATE OF DISCHARGE:  01/20/2009LH                               DISCHARGE SUMMARY   DISCHARGE DIAGNOSIS:  1. Staphylococcus aureus sepsis.  2. Right foot gangrene and osteomyelitis.  3. Status post right above-knee amputation.  4. Peripheral vascular disease and status post amputation of left four      toes.  5. End-stage renal disease on hemodialysis  6. Diabetes mellitus.  7. Anemia.   DISCHARGE MEDICATIONS:  1. Nafcillin 2 grams IV piggyback q.6 h. for 10 more days.  2. Hydralazine 50 mg t.i.d.  3. Cardizem CD 360 mg p.o. daily.  4. Sodium bicarbonate 650 mg b.i.d.  5. Accu-Chek with a sliding scale coverage.  6. Epogen 10,000 units during dialysis.  7. Lantus insulin 10 units subcu daily.  8. Lortab 5/500 1 tablet p.o. q.4 h. p.r.n. pain.  9. Actos 30 mg p.o. daily.   DISPOSITION:  The patient will be transferred to rehab center to  continue his treatment and physical therapy.   HOSPITAL COURSE:  This is a 65 year old male patient with a history of  multiple medical illnesses who was admitted on May 22, 2007 due to  nausea, vomiting, and fever..  The patient had a nonhealing wound on  left foot.  He had a yellowish discharge with offensive smelling.  The  patient was admitted as possible case of sepsis, and was started on IV  antibiotics.  His blood culture grew Staphylococcus aureus.  A bone scan  also showed osteomyelitis of the ankle and right foot.   The patient was evaluated by surgical team and was advised for  amputation.  The patient other underwent a right above-knee amputation  without any complication.  The patient is currently improving.  He is  receiving IV antibiotics.   However, the patient needs physical therapy  and occupational therapy for severe deconditioning.  The patient will be  discharged to rehab center to continue his current treatment and  physical therapy.      Tesfaye D. Felecia Shelling, MD  Electronically Signed     TDF/MEDQ  D:  05/30/2007  T:  05/30/2007  Job:  [id number]

## 2010-09-22 NOTE — Discharge Summary (Signed)
NAMEBRETTON, TANDY NO.:  [id number]   MEDICAL RECORD NO.:  [id number]          PATIENT TYPE:  IPS   LOCATION:  4008                         FACILITY:  [facility]   PHYSICIAN:  Ellwood Dense, M.D.   DATE OF BIRTH:  [birth year]   DATE OF ADMISSION:  05/30/2007  DATE OF DISCHARGE:  06/09/2007                               DISCHARGE SUMMARY   DISCHARGE DIAGNOSES:  1. Peripheral vascular disease with gangrenous changes and      osteomyelitis, right heel, requiring right below knee amputation.  2. End-stage renal disease.  3. Hypertension.  4. Diabetic retinopathy.  5. Diabetes mellitus type 2.   HISTORY OF PRESENT ILLNESS:  Mr. Cozort is a 65 year old male with  history of end-stage renal disease secondary to diabetes mellitus,  peripheral vascular disease with a right heel ulcer with history of  cellulitis and osteomyelitis.  The patient was admitted to [facility] on  January 12 with gangrenous changes, right heel, with nausea, vomiting  and fever.  Blood cultures positive for Staph.  The patient treated with  IV vancomycin with attempts at limb salvage.  There was no improvement.  He underwent right BKA  on January 16 by Dr. Franky Macho.  Postoperatively he was noted to have elevated blood sugars and Lantus  was added for better control.  Antibiotics changed to nafcillin for  treatment of Staph bacteremia.  The patient with impairments in mobility  and self-care and was admitted to rehab for further therapies.   PAST MEDICAL HISTORY:  See discharge diagnosis, plus history of anemia  of chronic disease, history of Staph sepsis in the past, recurrent  osteo, AV graft placement.   ALLERGIES:  NO KNOWN DRUG ALLERGIES.   FAMILY HISTORY:  Positive for asthma.   SOCIAL HISTORY:  The patient lives alone in an apartment, no steps at  entry.  Does not use any tobacco or alcohol.  Ambulated with a cane and  was driving prior to admission.  Daughter and sister to  assist post  discharge.   HOSPITAL COURSE:  Mr. Christipher Rieger was admitted to rehab on May 30, 2007, for inpatient therapies to consist of PT, OT daily.  Post  admission he was maintained on IV antibiotics.  Diabetes was monitored  on an a.c. and h.s. basis.  Blood sugars have ranged from the 90s to  130s with occasional high in the 180s.  Last weight is at 89 kilograms.  Blood pressure was monitored on a b.i.d. basis and has ranged from 120s  to 140s systolic, 60s to 16X diastolic.  The patient was maintained on a  renal diet with modifications during this stay.  Hemodialysis was  ongoing on Tuesday, Thursday, Saturday per protocol.   The patient's right BKA wound was monitored.  This was noted to be  healing well without any signs or symptoms of infection.  Staples  remained intact.  Stump shrinker was ordered on January 26.  Therapies  were initiated, and the patient was noted to be progressing along.  Currently the patient is able to ambulate up to 30 feet with supervision  with rolling walker.  He is able to perform wheelchair mobility greater  than 200 feet.  Family is able to perform car transfers with assist  supervision.  OT sessions have focused on balance at edge of bed.  They  have also worked on challenges regarding lateral lean and forward  leaning.  The patient is currently at supervision overall for BADLs, mod  independent for simple home management tasks at wheelchair level.  Family is to provide supervision assistance as needed post discharge.  Home health PT, OT, RN by [facility] has been set up.  On  June 09, 2007, the patient is discharged to home.   DISCHARGE MEDICATIONS:  1. Alprazolam 50 mg p.o. q.8 h.  2. Sodium bicarb 650 mg b.i.d.  3. Cardizem CD 360 mg a day.  4. Nephro-Vite 1 per day.  5. Sensipar 30 mg p.o. a.c. supper.  6. PhosLo 1334 mg t.i.d. with meals.  7. Fosrenol 500 mg 2 pills t.i.d. with meals.  8. Lantus insulin 20 units in  a.m., 14 units in p.m.  9. Vicodin 5/500 one p.o. q.8-12 h p.r.n. pain #45, Rx.   DIET:  AD __________ 1200 mL fluid restriction.   WOUND CARE:  Wash the area with antibacterial soap and water, pat dry.  Wear stump shrinker during the day.   ACTIVITIES:  Twenty-four-hour supervision assistance.  No strenuous  activity.  No alcohol, no smoking, no driving.   SPECIAL INSTRUCTIONS:  Should check CBGs at least b.i.d. basis.  [facility] to provide PT, OT, RN.  Follow up with Dr. Thomasena Edis at  prosthetic clinic March 4 at 10:40.  Follow up with Dr. __________  Saturday for dialysis.  Follow up with Dr. Lovell Sheehan for postop check.  Follow up with Dr. Felecia Shelling for routine management of diabetes and blood  pressure.      Greg Cutter, P.A.    ______________________________  Ellwood Dense, M.D.    PP/MEDQ  D:  06/09/2007  T:  06/10/2007  Job:  [id number]   cc:   Tesfaye D. Felecia Shelling, MD  Dr. Lovell Sheehan

## 2010-09-22 NOTE — H&P (Signed)
NAMEHENRRY, FEIL NO.:  [id number]   MEDICAL RECORD NO.:  [id number]          PATIENT TYPE:  INP   LOCATION:  A215                          FACILITY:  [facility]   PHYSICIAN:  Mila Homer. Sudie Bailey, M.D.DATE OF BIRTH:  1945-12-20   DATE OF ADMISSION:  03/06/2007  DATE OF DISCHARGE:  LH                              HISTORY & PHYSICAL   A 64 year old developed nausea and vomiting this morning.  His home  health aide checked him at home and found he was running a fever.  He  presented to the emergency room.   He has a long and complicated medical history.  Currently, he is on  hemodialysis three times a week.  He has had peripheral vascular  disease, with loss of his left second, fifth toes and his right fifth  toe.  He has osteomyelitis of the left second toe and is status post  left second toe amputation June 13, 2003.  He also had hypertension,  diabetes, as well as his peripheral vascular disease.  He had a left  open third, fourth, fifth toe amputation January 2003, and also a left  popliteal to dorsalis pedis artery bypass graft at that time.   PRIMARY MEDICAL DOCTOR:  Dr. Felecia Shelling.   CURRENT MEDICATIONS:  Include:  1. Cardizem CD 360 mg daily.  2. Hydralazine 50 mg t.i.d.  3. Actos 30 mg daily.  4. Sodium bicarbonate 650 mg b.i.d.  5. Furosemide 21 grams daily.  6. Aranesp 100 mcg subcu monthly.   He is essentially anuric.  He denies any fever and chills, any cough.   OBJECTIVE:  VITAL SIGNS:  Admission exam showed a pleasant middle-aged  man whose temperature is 97.3 p.o., blood pressure 143/79, pulse 106,  respiratory rate 20.  O2 sat was 98% room air.  Re-check temperature  rectally is 101.4 and a BP 104/65.  GENERAL:  He appeared to be oriented, alert.  Sentence structure was  intact.  Sensorium was normal.  SKIN:  Turgor was normal.  Mucous membranes moist, but he had  significant gum erosion both inferiorly and superiorly, with some  carious  teeth.  HEART:  Seemed to have a regular and rhythm rate of about 80.  LUNGS:  Clear throughout, moving air well, but decreased breath sounds.  ABDOMEN:  Soft without hepatosplenomegaly or mass.  EXTREMITIES:  Show he is missing toes 2 to 5 on the left and toes 5 on  the right.  The feet were somewhat warm.  Pulses difficult to palpate.  On the plantar surface of the right foot, he has an open 3 x 2-inch  ulceration with good granulation tissue at the base.   His white cell count today is 16,300, of which 88% neutrophils and 7  lymphs.  The H&H is 13.3/41.4, MCV of 84, platelet count 129,000.  His  BMP shows a sodium of 131, potassium 4.8, chloride 98, bicarb 22,  glucose 210, BUN 59, creatinine 10.16.  An estimated GFR is 6.   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  There is an old  healed distal fibular fracture, and the radiologist was unable to  exclude osteomyelitis at the base of the fourth and fifth metatarsal.   ADMISSION DIAGNOSES:  1. Rule out sepsis.  2. Type 2 diabetes.  3. Peripheral vascular disease status post 1 toe amputation of the      right and 4 toe amputations on the left.  4. Hypertension.  5. End-stage renal disease on hemodialysis 3 times a week  6. Chronic disease.   PLAN:  To start on vancomycin 1 gram IV, and then vancomycin IV after  each dialysis.  A blood culture is pending.  Will continue his Cardizem,  hydralazine, Actos, sodium bicarb, furosemide, Aranesp as above.  I am  consulting his nephrologist, Dr. Fausto Skillern.  He may need to have his  dialysis catheter removed, depending on the blood cultures.  Another  source for his infection also includes his teeth, all of which show  extensive gum loss.      Mila Homer. Sudie Bailey,  M.D.  Electronically Signed     SDK/MEDQ  D:  03/06/2007  T:  03/06/2007  Job:  [id number]

## 2010-09-22 NOTE — Group Therapy Note (Signed)
Julian [name], SKOWRON NO.:  [id number]   MEDICAL RECORD NO.:  [id number]          PATIENT TYPE:  INP   LOCATION:  A215                          FACILITY:  [facility]   PHYSICIAN:  Mila Homer. Sudie Bailey, M.D.DATE OF BIRTH:  November 02, 1945   DATE OF PROCEDURE:  DATE OF DISCHARGE:                                 PROGRESS NOTE   SUBJECTIVE:  Feels much better today.  No more nausea.  No fever.   OBJECTIVE:  Temperature 99.4, pulse 89, respiratory rate 20, blood  pressure 130/76.  He appears to be oriented and alert.  No acute distress this morning.  He is well-developed, well-nourished.  Lungs are clear throughout.  His heart is a regular rhythm rate of 90.  ABDOMEN:  Benign.  There is no edema of the ankles.   BMP showed a glucose 182, BUN 67, creatinine 10.76.  Glucoses have been  279 and 206 today.   Of note, both of his blood cultures growing out gram-positive cocci in  clusters.   ASSESSMENT:  1. Bacteremia probably secondary to staphylococcus aureus.  2. Type 2 diabetes.  3. Peripheral vascular disease status post amputation of the right      fifth toe and the left second and fifth toes.  4. Hypertension.  5. End-stage renal disease hemodialysis three times a week.  6. Anemia of chronic disease.   PLAN:  Continue the vancomycin.  Sensitivity pending.  Will consult Dr.  Kristian Covey, his nephrologist.      Mila Homer. Sudie Bailey, M.D.  Electronically Signed     SDK/MEDQ  D:  03/07/2007  T:  03/07/2007  Job:  [id number]

## 2010-09-22 NOTE — Discharge Summary (Signed)
NAMEARATH, KAIGLER NO.:  [id number]   MEDICAL RECORD NO.:  [id number]          PATIENT TYPE:  INP   LOCATION:  A215                          FACILITY:  [facility]   PHYSICIAN:  Mila Homer. Sudie Bailey, M.D.DATE OF BIRTH:  1945/09/25   DATE OF ADMISSION:  03/06/2007  DATE OF DISCHARGE:  10/31/2008LH                               DISCHARGE SUMMARY   A 65 year old was admitted the hospital with sepsis.  He had a benign 5-  day hospitalization extending from March 06, 2007 through March 10, 2007.  Vital signs remained stable.   LABORATORY DATA:  Admission white cell count 16,300 of which 88%  neutrophils, seven lymphs, H&H 13.3, 41.4, platelet count 129,000.  The  BMET showed a BUN 59, creatinine 10.16 and glucose was 210, a sodium  131.  Recheck BMET next day showed sodium 135, BUN 67, creatinine of  10.76.  Other BMETs checked were similar to these.   He had two blood cultures positive for staph aureus.  On 1 sensitivities  were done and the staph was sensitive to everything tested except for  levofloxacin and intermediately to moxifloxacin.  Culture of his  catheter tip also got greater than 100 colonies of  staph aureus.   He was admitted to the hospital with vitals every 4 hours, dialysis  three times a week and vancomycin as per pharmacy.  His saline lock.  He  was continued on Cardizem CD 360 mg daily, hydralazine 50 mg t.i.d.,  Actos 30 mg daily, furosemide 20 mg daily, Aranesp 100 mg subcutaneous  monthly and p.r.n. Tylenol.  He was put on Epogen 6600 units IV during  dialysis three times a week.   Dr. Lovell Sheehan, general surgeon, took out his catheter, which was out for  over a day before he had another Diatek catheter placed in the left  jugular done down in [place].  He did well on this regimen, did much  better, and was back to normal on the day of discharge.   FINAL DISCHARGE DIAGNOSES:  1. Staphylococcus aureus septicemia  2. End-stage renal  disease on dialysis  3. Type 2 diabetes.  4. Essential hypertension.  5. 2 x 3-inch grade 3 ulcer on the plantar surface of the right foot  6. Peripheral vascular disease status post one toe amputation the      right, amputation of 4 toes of the left foot.  7. Anemia of chronic disease.   When he returns home he will continue on Actos 30 mg daily, Cardizem 360  mg daily, hydralazine 50 mg t.i.d., renal vitamin tablet daily, Renagel  800 mg 5 tablets with each meal, two with snacks.  He will also need IV  antibiotics after each dialysis given his septicemia had only been  treated 5 days.  Follow-up for this will be through Dr. Kristian Covey.      Mila Homer. Sudie Bailey, M.D.  Electronically Signed     SDK/MEDQ  D:  03/10/2007  T:  03/10/2007  Job:  [id number]

## 2010-09-22 NOTE — H&P (Signed)
Julian [name], Julian [name]             ACCOUNT NO.:  [id number]   MEDICAL RECORD NO.:  [id number]          PATIENT TYPE:  INP   LOCATION:  A332                          FACILITY:  [facility]   PHYSICIAN:  Tesfaye D. Felecia Shelling, MD   DATE OF BIRTH:  Jun 25, 1945   DATE OF ADMISSION:  05/22/2007  DATE OF DISCHARGE:  LH                              HISTORY & PHYSICAL   CHIEF COMPLAINT:  Nausea, vomiting and fever.   HISTORY OF PRESENT ILLNESS:  This is a 65 year old male patient with  history of multiple medical illnesses who was brought to emergency room  with the above complaints.  The patient claims he has been in his usual  state of health until about two days ago when he started having  recurrent fever followed by nausea and vomiting.  The patient is a  dialysis patient.  However, he has not missed his dialysis.  He was  unable to take his medication and oral feeding due to the nausea and  vomiting.  The patient was evaluated in the emergency room, and he was  found to have a low-grade fever and leukocytosis.  The patient was  started on IV antibiotics and was admitted.   REVIEW OF SYSTEMS:  The patient has generalized weakness, headache and  recurrent fever.  No cough, chest pain, shortness of breath, abdominal  pain, dysuria, urgency or frequency of urination.   PAST MEDICAL HISTORY:  1. End-stage renal failure on hemodialysis.  2. Diabetes mellitus.  3. Chronic right foot ulcer.  4. Hypertension.  5. 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  6. Anemia.   CURRENT MEDICATIONS:  1. Cardizem CD 360 mg p.o. daily.  2. Hydralazine 50 mg t.i.d.  3. Actos 30 mg daily.  4. Sodium bicarbonate 650 b.i.d.  5. Lasix 20 mg daily.   SOCIAL HISTORY:  The patient has no history of alcohol, tobacco or  substance abuse.  The patient lives alone at home.   PHYSICAL EXAMINATION:  The patient is alert, awake, acutely sick looking  with vital signs:  Blood  pressure 106/69, pulse 88, respiratory rate 20,  temperature 100.5 degrees Fahrenheit.  HEENT:  Pupils are equal and reactive.  NECK:  Is supple.  CHEST:  Poor air entry, bilateral rhonchi.  CARDIOVASCULAR SYSTEM:  First and second heart sounds heard.  No murmur,  no gallop.  ABDOMEN:  Is soft and lax.  Bowel sounds positive.  No mass or  organomegaly.  EXTREMITIES:  The patient has infected large wound on his plantar side  of the right foot.  There is some ischemic and gangrenous change.   LABORATORY DATA:  Lipase 23.  CMP:  Sodium 128, potassium 4.5, chloride  94, carbon dioxide 23, glucose 224, BUN 38, creatinine 7.7 calcium 7.0.  CBC:  WBC 31.1, hemoglobin 10.5, hematocrit 33 and platelet 249.   ASSESSMENT:  This is 65 year old male patient with a known case of  multiple medical illnesses including end-stage renal failure and  diabetes mellitus who came due to nausea, vomiting and fever.  Patient  has infected right foot wound.  The patient could have sepsis secondary  to his wound.  However, it could be also simple gastroenteritis.   PLAN:  I will do septic workup and empirically start the patient on IV  antibiotics.  Will do nephrology consult for his hemodialysis.  Will do  wound care consult.  Will do surgical consult for evaluation of his  wound.  Will do bone scan.  Will continue the patient on his regular  medications.      Tesfaye D. Felecia Shelling, MD  Electronically Signed     TDF/MEDQ  D:  05/22/2007  T:  05/23/2007  Job:  [id number]

## 2010-09-22 NOTE — Procedures (Signed)
CEPHALIC VEIN MAPPING   INDICATION:  End-stage renal disease.   HISTORY:  End-stage renal disease.   EXAM:   The right cephalic vein is compressible.   Diameter measurements range from 0.32 to 0.33.   The left cephalic vein is compressible.   Diameter measurements range from 0.19 to 0.39.   See attached worksheet for all measurements.   IMPRESSION:  Patent right cephalic vein which is of acceptable diameter  for use as a dialysis access site.   ___________________________________________  Di Kindle. [name], M.D.   MG/MEDQ  D:  01/10/2007  T:  01/10/2007  Job:  [id number]

## 2010-09-22 NOTE — Consult Note (Signed)
NAMEJERMANIE, MINSHALL NO.:  [id number]   MEDICAL RECORD NO.:  [id number]          PATIENT TYPE:  INP   LOCATION:  A215                          FACILITY:  [facility]   PHYSICIAN:  Jorja Loa, M.D.DATE OF BIRTH:  04/08/1946   DATE OF CONSULTATION:  03/06/2007  DATE OF DISCHARGE:                                 CONSULTATION   REASON FOR CONSULT:  End-stage renal disease, for hemodialysis.   Mr. Bussiere is a 65 year old African American with past medical history  of diabetes, history of hypertension, and a history of peripheral  vascular disease, multiple amputation of his toes, presently was brought  because of history of nausea, vomiting, and he was found to have also a  fever, hence for possible infection.  Mr. Sees was started on  hemodialysis __________ and overall he was doing reasonably good but on  examination, he was found to have an ulcer of his left foot and with  some drainage.  Because of that, patient was referred to a podiatrist  where he has been followed since then.  Because of infection, patient  was put on oral antibiotics.  Presently, as stated above, came with  nausea, vomiting, and a fever for possible infection.  Presently, Mr.  Drew feels better.  He does not have any nausea, no vomiting, no  fever, chills, or sweating.   PAST MEDICAL HISTORY:  1. History of hypertension.  2. History of longstanding diabetes.  3. History of peripheral vascular disease.  4. History of CHF.  5. History of diabetic retinopathy status post laser treatment.  6. History of obesity.  7. History of also multiple osteomyelitis and surgery of multiple toes      at a different times.  Presently, he has an ulcer of his feet.   MEDICATIONS:  At this moment, consist of:  1. Tiazac 360 mg p.o. daily.  2. __________ 450 mg p.o. daily.  3. Actos 40 mg p.o. daily.  4. He has received vancomycin 1000 mg and also had some Tylenol.   ALLERGIES:  NO KNOWN DRUG  ALLERGY.   SOCIAL HISTORY:  No history of smoking.  No history of alcohol abuse.   REVIEW OF SYSTEMS:  He does not have any nausea or vomiting.  Appetite  is reasonable.  Denies any fever, chills, or sweating.  He denies also  any shortness of breath, dizziness, or lightheadedness.   EXAMINATION:  His temperature is 99.4.  Blood pressure of 130/76.  CHEST:  Clear to auscultation.  No rales.  No rhonchi.  No egophony.  HEART:  Revealed regular rate and rhythm.  No murmur.  ABDOMEN:  Soft.  Positive bowel sound.  EXTREMITIES:  No edema.   His white blood cell count is 16.3, hemoglobin 17.3, and hematocrit of  41.4 with platelets of 129.  His sodium is 135, potassium 4.7, BUN 67,  and creatinine of 10.6.  He has a blood culture which showed gram-  positive cocci.   ASSESSMENT:  1. Fever with a blood culture showing gram-positive cocci.  Source at      this moment is not clear  as patient has osteomyelitis for which he      has been on antibiotics and also he has the hemodialysis catheter      which has been used with maturing AV fistula.  Hence, the source      could be the wound and also probably the catheteralso at this      moment could be the possible source of infection.  Presently, he      has received a dose of vancomycin.  Organism identification still      pending.  2. End-stage status post hemodialysis on Saturday.  BUN and creatinine      within acceptable range.  Normal potassium and no sign of fluid      overload.  3. History of hypertension.  Blood pressure seems to be controlled      reasonably good.  4. History of diabetes.  He is on hypoglycemic agent.  Usually his      blood sugar fluctuates, possibly also because of his infection.  5. History of cellulitis and osteomyelitis, patient being followed by      podiatric physician.  6. History of anemia secondary to chronic renal failure.  He is on      Epogen.  7. History of diabetic retinopathy status post laser  treatment.  8. Severe peripheral vascular disease status post multiple amputation      of his toe.   RECOMMENDATIONS:  We will continue with dialysis, probably we will check  his fistula.  It seems to be started but probably not sure whether it  has matured but because of the concern for access infection at this  moment probably the best thing to do may be to take out his catheter and  if he needs another catheter, to replace that.  We will continue his  vancomycin and we will make arrangement for him to get dialysis today  and will follow his blood work.      Jorja Loa, M.D.  Electronically Signed     BB/MEDQ  D:  03/07/2007  T:  03/07/2007  Job:  [id number]

## 2010-09-22 NOTE — Op Note (Signed)
NAMEKOHLTON, GILPATRICK             ACCOUNT NO.:  [id number]   MEDICAL RECORD NO.:  [id number]          PATIENT TYPE:  AMB   LOCATION:  SDS                          FACILITY:  [facility]   PHYSICIAN:  Juleen China IV, MDDATE OF BIRTH:  [birth year]   DATE OF PROCEDURE:  03/09/2007  DATE OF DISCHARGE:                               OPERATIVE REPORT   PREOPERATIVE DIAGNOSIS:  End stage renal disease.   POSTOPERATIVE DIAGNOSIS:  End stage renal disease.   PROCEDURE PERFORMED:  Left internal jugular Diatek catheter.   Type of anesthesia is MAC.   Blood loss is minimal.   INDICATIONS:  This is a 65 year old gentleman with end stage renal  disease who had an infected right sided catheter.  He needs left new  access for dialysis.  Informed consent was obtained.   PROCEDURE:  The patient was taken to room 6.  He was placed supine on  the table.  The neck was prepped and draped in a standard sterile  fashion.  Lidocaine 1% was used for local anesthesia.  Using ultrasound,  the left internal jugular vein was evaluated and noted to be widely  patent.  Under ultrasound guidance, the left internal jugular vein was  accessed with an 18-gauge needle.  An 0.035 wire was then advanced into  the inferior vena cava under fluoroscopic visualization.  Next, a series  of dilators were used to dilate the tract.  The peel-away sheath was  then placed over the wire.  The introducer and wire were removed.  A 32-  cm catheter was then placed within the peel-away sheath which was  removed.  The tip of the catheter was at the cavoatrial junction.  Next,  a subcutaneous tunnel was created.  A stab incision was made for the  skin exit site.  The tunneler was then used to create the tunnel which  was then subsequently dilated.  The catheter was then brought through  the tract.  The cuff was positioned at the skin exit site.  Fluoroscopy  was used to confirm that the tip was at the cavoatrial junction.  There  were no kinks within the catheter.  Both ports flushed and aspirated  without difficulty.  The appropriate volumes of heparin were then  placed.  The catheter was sutured into position with a 3-0 nylon.  The  skin incision in the neck was closed with a 4-0 nylon.  Dermabond and  sterile dressings were placed.  The patient tolerated the procedure  well, was returned to the recovery room in stable condition.      Jorge Ny, MD  Electronically Signed     VWB/MEDQ  D:  03/09/2007  T:  03/09/2007  Job:  [id number]

## 2010-09-22 NOTE — Assessment & Plan Note (Signed)
OFFICE VISIT   Julian [name], ROTHE  DOB:  [birth year]                                       01/10/2007  CHART#:[id number]   I saw the patient in the office today to evaluate him for hemodialysis  access.  He currently has a functioning right IJ Diatek catheter.  He  dialyzes on Tuesday's, Thursday's, and Saturday's.  Of note, he has a  chronic wound on his right foot which is followed elsewhere.  We were  asked to evaluate him for access.   REVIEW OF SYSTEMS:  He has had no recent chest pain, chest pressure or  palpitations.   SOCIAL HISTORY:  He does not smoke cigarettes.   PHYSICAL EXAMINATION:  He has a palpable brachial and radial pulse in  both arms.  I do not see a usable forearm cephalic vein on either side.  He did undergo vein mapping in our office today and appears the most  reasonable vein for a fistula would be an upper arm fistula on the right  side.  I did look at his foot and he has a large open wound on his right  foot which would put him at increased risk for developing a graft  infection if a graft was placed.   All things considered, I think it is going to be a long time before the  wound on his foot is healed and I think it would be reasonable to place  an upper arm fistula on the right if the vein is reasonable.  If this is  not reasonable, I would be reluctant to place a graft because of the  risk of  graft infection given this open wound on the right foot.  His surgery  has been scheduled for September 10th.   Di Kindle. [name], M.D.  Electronically Signed   CSD/MEDQ  D:  01/10/2007  T:  01/11/2007  Job:  [id number]

## 2010-09-25 NOTE — Discharge Summary (Signed)
[place]  Patient:    Julian [name], Julian [name] Visit Number: [id number] MRN: [id number]          Service Type: DSU Location: *N Attending Physician:  Bennye Alm Dictated by:   Adair Patter, P.A.-C. Adm. Date:  05/15/01 Disc. Date: 06/08/01   CC:         [facility] Office  Kirstie Peri, M.D.   Discharge Summary  DATE OF BIRTH:  10-May-1946.  ADMITTING DIAGNOSES: 1. Peripheral vascular disease. 2. Gangrene of the left foot.  SECONDARY DIAGNOSES: 1. Type 2 diabetes mellitus. 2. Hypertension. 3. Obesity.  DISCHARGE DIAGNOSIS:  Peripheral vascular disease.  HOSPITAL PROCEDURES: 1. Left left lower extremity arteriogram. 2. Left popliteal dorsalis pedis bypass graft. 3. Left third, fourth, and fifth toe amputations.  HOSPITAL COURSE:  The patient was admitted to [facility] on May 15, 2001 secondary to gangrene of several toes on his left foot. Because of this, Dr. [name] performed a left lower extremity arteriogram revealing the patient could possibly have this condition recorrected by surgical revascularization and toe amputation.  On May 16, 2001, the patient underwent left popliteal dorsalis pedis bypass graft with nonreverse saphenous vein graft __________ arteriogram.  At the same time, the patient also had his left third, fourth, and fifth toes amputated.  Postoperatively, the patients vascular graft remained patent throughout his entire hospital course.  There was some difficulty in obtaining adequate control of the patients diabetes; however, this was achieved with glyburide 5 mg p.o. b.i.d., metformin 500 mg p.o. b.i.d., Actos 30 mg daily and Lantus Insulin 5 units daily.  Because of the patients open amputation, he underwent aggressive wound therapy with pulsatile lavage, VAC treatment and local wound care.  The patient also received a course of antibiotics Zosyn for 10 days and he received a 14-day course  of Cipro while in the hospital.  The patients wound began granulating slowly.  The patient eventually began ambulating on this wound.  It was felt on June 07, 2001 the patients foot was stable enough for him to be discharged.  He was subsequently discharged home in stable condition on June 08, 2001.  MEDICATIONS AT TIME OF DISCHARGE:  1. Tylox one to two tabs every four to six hours as needed for pain.  2. Norvasc 5 mg one tablet twice daily.  3. Vitamin C 1000 mg daily.  4. Multivitamin one daily.  5. Zinc 220 mg daily.  6. Cipro 500 mg one tablet three times daily for one week.  7. Glyburide 5 mg one tablet twice daily.  8. Glucophage 500 mg one tablet twice daily.  9. Actos 30 mg one daily. 10. Lantus Insulin 5 units subcutaneously every day.  ACTIVITY:  The patient is told to avoid driving, strenuous activity, lifting heavy objects.  He is told to continue to walk daily.  DISCHARGE DIET:  1800 calorie ADA diet.  WOUND CARE:  The patient will have visiting nurse come to change dressing on his foot twice daily with wet-to-dry dressings with normal saline, 4 x 4s, Kerlix and Ace bandage.  DISPOSITION:  Home.  FOLLOWUP:  The patient will see Dr. [name] in one week at the CVTS office on Wednesday, June 14, 2001 at 8:40 a.m. Dictated by:   Adair Patter, P.A.-C. Attending Physician:  Bennye Alm DD:  06/07/01 TD:  06/07/01 Job: [id number]

## 2010-09-25 NOTE — Op Note (Signed)
Julian [name], SIRES                       ACCOUNT NO.:  [id number]   MEDICAL RECORD NO.:  [id number]                   PATIENT TYPE:  INP   LOCATION:  3713                                 FACILITY:  [facility]   PHYSICIAN:  Di Kindle. [name], M.D.        DATE OF BIRTH:  1946/03/06   DATE OF PROCEDURE:  06/13/2003  DATE OF DISCHARGE:                                 OPERATIVE REPORT   PREOPERATIVE DIAGNOSIS:  Osteomyelitis of the left second toe.   POSTOPERATIVE DIAGNOSIS:  Osteomyelitis of the left second toe.   PROCEDURE:  Left second toe amputation.   SURGEON:  Di Kindle. [name], M.D.   ASSISTANT:  Nurse.   ANESTHESIA:  Digital block with sedation.   DESCRIPTION OF PROCEDURE:  The patient was taken to the operating room and  sedated by anesthesia. The left foot was prepped and draped in the usual  sterile fashion.  I performed a digital block of the left second toe using  1% lidocaine without epinephrine.  Once adequate anesthesia was obtained, an  elliptical incision was made encompassing the second toe.  The dissection  was carried down to the bone which was then divided and rongeured back to  healthy appearing bone. The hemostasis was obtained using electrocautery.  The wound was irrigated with copious amounts of saline and then the wound  was closed with interrupted 3-0 nylons. A sterile dressing was applied.  The  patient tolerated the procedure well and was transferred to the recovery  room in satisfactory condition.  All needle, sponge, and instrument counts  correct.                                               Di Kindle. [name], M.D.    CSD/MEDQ  D:  06/13/2003  T:  06/13/2003  Job:  [id number]

## 2010-09-25 NOTE — Op Note (Signed)
NAMEJAZMIN, Julian [name]             ACCOUNT NO.:  [id number]   MEDICAL RECORD NO.:  [id number]          PATIENT TYPE:  AMB   LOCATION:  DAY                           FACILITY:  [facility]   PHYSICIAN:  R. Roetta Sessions, M.D. DATE OF BIRTH:  04/09/1946   DATE OF PROCEDURE:  DATE OF DISCHARGE:                               OPERATIVE REPORT   DATE OF PROCEDURE:  May 05, 2006   PROCEDURE:  Colonoscopy, snare polypectomy.   INDICATIONS FOR PROCEDURE:  The patient is a 65 year old gentleman with  chronic renal insufficiency who presents with progressive anemia,  hemoccult positive stool.  He has no upper GI tract symptoms.  He has  had no hematochezia or melena.  He has never had his lower GI tract  imaged.  Colonoscopy is now being done.  This approach has been  discussed with the patient at length.  Potential risks, benefits and  alternatives have been reviewed and questions answered.  He is  agreeable.  Please see documentation in the medical record.   PROCEDURE NOTE:  O2 saturation, blood pressure, pulse, respirations were  monitored throughout the entire procedure.  Conscious sedation of Versed  4 mg IV and Demerol 100 mg IV in divided doses.   INSTRUMENT:  Pentax video chip system.   It was discussed with the patient that if colonoscopy is unrevealing, an  EGD will be performed.   FINDINGS:  Digital rectal exam revealed no abnormalities.  At endoscopy  prep was suboptimal.   The colonic mucosa was surveyed from the rectosigmoid junction through  the left transverse and right colon to the appendiceal orifice,  ileocecal valve and cecum.  These structures were well seen and  photographed for the record.  From this level the scope was slowly  withdrawn and previously seen mucosal surfaces were again seen.  In the  proximal ascending colon just distal to the ileocecal valve there was a  1.5 cm sessile polyp which was oozing.  It actually when just touched  with the scope oozed  somewhat briskly.  Please see photos.  There was  also a 0.5 cm polyp at the splenic flexure.  The remainder of the  colonic mucosa appeared normal.  The polyp in the proximal ascending  colon was lifted away from the colonic wall with 3 mL of 1:10,000  epinephrine injected at the base of the polyp.  It was resected when  passed with the snare cautery.  Good hemostasis was achieved.  The polyp  broke up and was recovered in piecemeal fashion through the scope.  The  second 0.5 cm polyp was resected with the hot snare and recovered  through the scope.  The remainder of the colonic mucosa appeared normal.  Scope was pulled down into the rectum where a thorough examination of  the rectal mucosa via retroflex view of the anal verge was undertaken.  The rectal mucosa appeared normal.   The patient tolerated the procedure well.  I suspect the ascending colon  polyp is a major contributing factor to the patient's anemia and  Hemoccult positive stool, therefore, EGD not done.  The patient  tolerated the procedure well.   ENDOSCOPY IMPRESSION:  1. Normal rectum.  2. Colonic polyps as described above with ascending colon polyp      actively oozing status post hot snare polypectomy as described      above.   RECOMMENDATIONS:  1. No aspirin or arthritis medications for 10 days.  2. CBC today to see where we stand.  3. Follow-up on path.  4. Further recommendations to follow.      Jonathon Bellows, M.D.  Electronically Signed     RMR/MEDQ  D:  05/05/2006  T:  05/05/2006  Job:  [id number]   cc:   Jorja Loa, M.D.  Fax: [phone]

## 2010-09-25 NOTE — Consult Note (Signed)
NAMEERHARDT, DADA NO.:  [id number]   MEDICAL RECORD NO.:  [id number]          PATIENT TYPE:  AMB   LOCATION:                                FACILITY:  [facility]   PHYSICIAN:  Kassie Mends, M.D.      DATE OF BIRTH:  01/23/1946   DATE OF CONSULTATION:  03/23/2006  DATE OF DISCHARGE:                                   CONSULTATION   REASON FOR CONSULTATION:  Blood in stool.   REQUESTING PHYSICIAN:  Jorja Loa, M.D.   HISTORY OF PRESENT ILLNESS:  Mr. Arp is a 65 year old gentleman with  history of renal insufficiency, hypertension, diabetes and peripheral  vascular disease who presents for further evaluation of declining  hemoglobin/hematocrit and heme positive stools.  Over the course of the last  six months, his hemoglobin has dropped from 12.2 to 9.4.  His hematocrit is  28.5.  MCV unknown.  He recently had one positive hemoccult card.  The  patient denies any melena or fresh blood per rectum.  He has never had a  colonoscopy.  Denies any constipation, diarrhea, abdominal pain, nausea,  vomiting, heartburn, dysphagia/odynophagia or weight loss.   CURRENT MEDICATIONS:  1. Lasix 20 mg daily.  2. Cardizem 360 mg daily.  3. Actos 30 mg daily.  4. Hydralazine one t.i.d.  5. Sodium bicarbonate b.i.d.   ALLERGIES:  No known drug allergies.   PAST MEDICAL HISTORY:  1. Chronic renal insufficiency.  2. Hypertension.  3. Diabetes mellitus.  4. Congestive heart failure.  5. Anemia.  6. Peripheral vascular disease with right foot ulcers.  7. Had bilateral eye surgery related to his diabetes.  8. Surgery to his right hand, some sort of injury.  9. Surgery on his left leg for PVD. Dorsalis pedis artery bypass in 2003.      He has lost four toes on his left foot and one toe on his right foot      for PVD.   FAMILY HISTORY:  Mother had heart disease.  Father had lung disease.  No  family history of colorectal cancer.   SOCIAL HISTORY:  He is single with  no children.  He is employed at [employer]  part time.  Nonsmoker, no alcohol use.   REVIEW OF SYSTEMS:  See HPI for GI and Constitutional.  CARDIOPULMONARY:  No  chest pain or shortness of breath.   PHYSICAL EXAMINATION:  VITAL SIGNS:  Weight 261, height 5 feet 10 inches,  temperature 97.9, blood pressure 162/98, pulse 70.  GENERAL:  Pleasant, well-nourished, well-developed gentleman in no acute  distress.  SKIN:  Warm and dry.  No jaundice.  HEENT:  Sclerae nonicteric.  Oropharyngeal mucosa moist and pink.  No  lymphadenopathy or thyromegaly.  CHEST:  Lungs clear to auscultation.  CARDIAC:  Regular rate and rhythm.  Normal S1, S2.  No murmurs, rubs or  gallops.  ABDOMEN:  Positive bowel sounds.  Soft, nondistended, nondistended.  No  organomegaly or masses.  No rebound tenderness or guarding.  No abdominal  bruits or hernias.  EXTREMITIES:  Pedal edema 1+ bilaterally.   LABORATORY  DATA:  As mentioned in history of present illness.  In addition,  his creatinine is 3.1, BUN 42, albumin 3.  Does have history of proteinuria  with greater than 4 g of protein per 24 hour urine.   IMPRESSION:  The patient is a 65 year old gentleman with long history of  diabetes, hypertension and chronic renal insufficiency who presents with  progressive anemia and heme positive stool.  Suspect progressive anemia  secondary to chronic GI bleeding.  We need to rule out possibility of  colorectal cancer, colonic polyps, AVM's.  If his colonoscopy is  unremarkable, he will need an EGD as well.   PLAN:  1. Colonoscopy with possible EGD in the near future.  2. Further recommendations following.      Tana Coast, P.A.      Kassie Mends, M.D.  Electronically Signed    LL/MEDQ  D:  03/23/2006  T:  03/23/2006  Job:  [id number]   cc:   Jorja Loa, M.D.  Fax: [phone]

## 2010-09-25 NOTE — Op Note (Signed)
[place]. [facility]  Patient:    EVERET, FLAGG Visit Number: [id number] MRN: [id number]          Service Type: MED Location: 3000 3005 01 Attending Physician:  Bennye Alm Dictated by:   Di Kindle. [name], M.D. Proc. Date: 05/15/01 Admit Date:  05/14/2001   CC:         Kirstie Peri, M.D.   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  OPERATION PERFORMED:  Left lower extremity arteriogram.  SURGEON:  Di Kindle. [name], M.D.  ASSISTANT:  Nurse.  ANESTHESIA:  Local with sedation.  DESCRIPTION OF PROCEDURE:  The patient was brought to the peripheral vascular lab at [facility]. [facility] and sedated with 1 mg of Versed and 50 mcg of fentanyl.  The left groin was prepped and draped in the usual sterile fashion.  After the skin was anesthetized with 1% lidocaine, the left common femoral artery was cannulated and a guide wire introduced into the infrarenal aorta under fluoroscopic control.  A 5 French sheath was passed over the wire and then the wire and dilator were removed.  The catheter was passed.  A flush arteriogram was then obtained.  Additional subtraction films were obtained of the left lower extremity.  FINDINGS:  The distal external iliac artery, common femoral artery and superficial femoral artery were all widely patent.  The deep femoral artery was patent.  The popliteal artery was patent.  There was occlusion of the tibial peroneal trunk.  The anterior tibial artery was patent but has severe diffuse disease throughout it.  There was reconstitution of the distal anterior tibial artery just above the ankle.  Of note, the posterior tibial artery and peroneal arteries were occluded.  There is a stenosis at the level of the ankle and then  filling of the dorsalis pedis artery in the foot.  The lateral projection demonstrates the dorsalis pedis artery is patent with some diffuse distal disease.  There is some reconstitution of a small plantar vessel.  FINDINGS:   Conclusion:  patent common femoral artery, superficial femoral artery and popliteal arteries with severe tibial occlusive disease and single vessel runoff via a diseased anterior tibial artery with reconstitution of a distal anterior tibial artery with a patent dorsalis pedis artery and severe digital disease. Dictated by:   Di Kindle [name], M.D. Attending Physician:  Bennye Alm DD:  05/15/01 TD:  05/15/01 Job: [id number]

## 2010-09-25 NOTE — H&P (Signed)
Julian [name], Julian [name] NO.:  [id number]   MEDICAL RECORD NO.:  [id number]                   PATIENT TYPE:  INP   LOCATION:                                       FACILITY:  [facility]   PHYSICIAN:  Di Kindle. [name], M.D.        DATE OF BIRTH:  27-Apr-1946   DATE OF ADMISSION:  06/11/2003  DATE OF DISCHARGE:                                HISTORY & PHYSICAL   REASON FOR ADMISSION:  Osteomyelitis of the left 2nd toe.   HISTORY:  This is a pleasant 65 year old gentleman who had undergone a  previous left popliteal-to-dorsalis-pedis-artery bypass graft with a non-  reversed translocated saphenous vein graft, in addition to left open  3rd/4th/5th toe amputations in January of 2003.  He did amazingly well after  this and ultimately healed these wounds.  I saw him last week in the office  with a wound on the left 2nd toe with drainage and a foul odor.  In  addition, there was a superficial wound on the left great toe.  He was  placed on Keflex and an x-ray was obtained.  He returned for an office visit  and although the toe looked slightly improved, x-ray clearly showed  osteomyelitis involving the middle and distal phalanges of the left 2nd toe.  No osteomyelitis was noted on the great toe.  He is admitted now for left  2nd toe amputation.  To improve his changes of healing this, he will need to  be admitted for some physical therapy and learn how to ambulate  weightbearing on the left foot probably with a Darco shoe.   PAST MEDICAL HISTORY:  His past medical history is significant for:  1. Peripheral vascular disease.  2. Type 2 diabetes, he is non-insulin-dependent.  3. Hypertension.  4. Obesity.  5. A history of pneumonia in the past.   He denies any history of hypercholesterolemia, history of previous  myocardial infarction or history of congestive heart failure.   FAMILY HISTORY:  There is no history of premature cardiovascular disease.  Mother had  high blood pressure and his father had diabetes.   SOCIAL HISTORY:  He does not use tobacco.  He is single.  He does not have  any children.  He does not use alcohol on a regular basis.   ALLERGIES:  He has no known drug allergies.   MEDICATIONS:  He is on:  1. Keflex 500 mg p.o. t.i.d.  2. Zestril 10 mg 1 p.o. daily.  3. Actos 30 mg 1 p.o. daily.  4. K-Dur 10 mEq 1 p.o. daily.  5. Hydralazine 25 mg 1 p.o. t.i.d.  6. Lasix 20 mg 1 p.o. daily.  7. Clonidine 0.1 mg p.o. b.i.d.  8. Glyburide 5 mg 1 p.o. b.i.d.   REVIEW OF SYSTEMS:  The patient has had no recent weight loss, weight gain  or fever.  CARDIAC:  He has had no chest pain, chest pressure, orthopnea,  dyspnea on exertion or palpitations.  PULMONARY:  He denies any history of  bronchitis, asthma or wheezing.  GI:  He has had no recent change in his  bowel habits and has no history of peptic ulcer disease.  GU:  He has had no  dysuria or frequency.  VASCULAR:  He has had no history of DVT or phlebitis.  He has had no recent pain.  NEUROLOGIC:  He has had no dizziness, blackouts,  headaches or seizures.  HEMATOLOGIC:  He has had no bleeding problems or  clotting disorders that he is aware of.  MUSCULOSKELETAL AND SKIN:  He has  had no arthritis, joint pain or rash.  PSYCHIATRIC:  He has had no history  of depression or nervousness.   PHYSICAL EXAMINATION:  VITAL SIGNS:  On physical examination, blood pressure  is 170/80, heart rate is 84.  NECK:  I do not detect any carotid bruits.  LUNGS:  Lungs are clear bilaterally to auscultation.  CARDIAC:  On cardiac exam, he has a regular rate and rhythm.  ABDOMEN:  Abdomen is soft and nontender.  EXTREMITIES:  He has palpable femoral pulses.  He has a palpable dorsalis  pedis on the left with a patent graft on the left.  On the right side, the  foot is warm and well-perfused without ischemic ulcers.  He has mild  bilateral lower extremity swelling.  He has a draining wound of the left  2nd  toe.   X-RAY FINDINGS:  X-ray confirms osteomyelitis of the middle and distal  phalanges of the 2nd toe.   IMPRESSION:  This patient presents with osteomyelitis of the left second toe  and is for left second toe amputation.  He will be admitted postoperatively  for some physical therapy and learn to walk partial weightbearing on the  left foot with a Darco shoe.  He should have adequate circulation to a heal  a toe amputation.                                                Di Kindle. [name], M.D.    CSD/MEDQ  D:  06/05/2003  T:  06/05/2003  Job:  [id number]

## 2010-09-25 NOTE — Discharge Summary (Signed)
Julian [name], Julian [name]                       ACCOUNT NO.:  [id number]   MEDICAL RECORD NO.:  [id number]                   PATIENT TYPE:  INP   LOCATION:  3713                                 FACILITY:  [facility]   PHYSICIAN:  Carolyn A. Thelma Barge, P.A.            DATE OF BIRTH:  06-Apr-1946   DATE OF ADMISSION:  06/11/2003  DATE OF DISCHARGE:  06/17/2003                                 DISCHARGE SUMMARY   ADMISSION DIAGNOSES:  1. Osteomyelitis of the left second toe.  2. Hyperkalemia.   DISCHARGE DIAGNOSES:  1. Osteomyelitis of the left second toe, status post left second toe     amputation on June 13, 2003.  2. Hyperkalemia.  3. Renal insufficiency.  4. Hypertension.  5. Diabetes mellitus.  6. Peripheral vascular disease.  7. Obesity.  8. History of pneumonia.  9. Status post left popliteal to dorsalis pedis artery bypass graft and a     left open third, fourth and fifth toe amputations in January  2003.   HOSPITAL PROCEDURE/MANAGEMENT:  1. Pre-admission prior to operating room for IV fluids and Kayexalate to     correct hyperkalemia and renal insufficiency.  2. Left second toe amputation completed by Dr. Di Kindle. [name] on     June 13, 2003.   CONSULTATIONS:  1. Renal service.  2. Physical therapy.   HISTORY OF PRESENT ILLNESS:  The patient is a pleasant 65 year old gentleman  who had undergone a previous left popliteal to dorsalis pedis bypass graft  in addition to a left open third, fourth and fifth toe amputation in January  2003.  The patient did amazingly well after his surgery and ultimately  healed these wounds.  Dr. [name] saw the patient the week prior to  presentation, in the office, with a wound on the left second toe with  drainage and foul odor.  In addition, there was a superficial wound on the  left great toe.  The patient was placed on Keflex, and an x-ray was  obtained.  The X-ray clearly showed osteomyelitis involving the middle and  distal  phalanges of the left second toe.  No osteomyelitis on the great toe.  The plan was for the patient to be admitted for a left second toe  amputation.  The risks, benefits and alternatives were discussed with the  patient prior to admission, and he was agreeable to surgery.   HOSPITAL COURSE:  The patient was admitted to [facility] on June 11, 2003, in anticipation of a left great toe  amputation.  Unfortunately, at the time of presentation, the patient was  severely hyperkalemic and showed evidence of renal insufficiency.  A renal  consultation was obtained, and the patient was treated medically for these  issues.  The surgery was held until his laboratory values had resolved.  The  patient was treated with Kayexalate and IV fluids, with a subsequent  improvement of his hyperkalemia  and renal insufficiency.  Prior to his surgery, his laboratory values were as follows:  Sodium 143,  potassium 4.5, chloride 115, CO2 of 22, BUN 34, creatinine 2.0, and glucose  of 73.  The patient was taken to the operating room on June 13, 2003, and  underwent a left second toe amputation.  He tolerated the procedure well.  He was transferred back to the floor unit for further management.  The  patient was fitted for a Darco shoe by the ortho-tech later that day.  He  continued to do well postoperatively, and was amputating without difficulty  with a walker and a Darco shoe for the next several postoperative days.  The  patient was evaluated by physical therapy on June 14, 2003, and it was  deemed that the patient had no outpatient physical therapy needs.  He was  ambulating independently, and was able to transfer without any difficulty.  The patient's wound was dressed appropriately daily by the nursing staff  with dry 4 x 4's, Kerlix and an Ace wrap.  The patient's pain was well-  controlled.  He remained afebrile.  His wound was stable.   DISPOSITION:  It was deemed  appropriate for discharge planning on June 16, 2003, or postoperative day number three.  The patient was improving with  ambulation with a walker and his Darco shoe.  He was afebrile, and his vital  signs were stable.  His wound was clean and healing and showing evidence of  healing.  Will continue as planned with discharge on Monday, June 17, 2003, pending a.m. rounds, and no change in the patient's clinical status.   DISCHARGE LABORATORY VALUES:  At the time of discharge planning are as  follows:  Sodium 140, potassium 4.2, chloride 113, CO2 of 24, glucose 144,  BUN 34, creatinine 2.0, calcium 8.5 completed on June 16, 2003.   DISCHARGE MEDICATIONS:  1. Actos 30 mg p.o. daily.  2. Hydralazine 25 mg t.i.d.  3. Clonidine 0.1 mg b.i.d.  4. Glyburide 5 mg b.i.d.  5. Keflex 500 mg t.i.d.  6. Tylox one to two tab q.4-6h. p.r.n.   NOTATION:  Please note that the patient's Lasix, K-Dur and Zestril are all  being held, due to hyperkalemia and renal insufficiency.   ALLERGIES:  No known drug allergies.   DISCHARGE INSTRUCTIONS:  1. ACTIVITY:  The patient should walk daily with the walker and a Darco     shoe, as instructed.  2. DIET:  The patient should follow a carbohydrate-modified, diabetic-     appropriate diet.  3. WOUND CARE:  The patient should complete dressing changes daily with dry     4 x 4's, Kerlix and 4 inch Ace bandages as instructed by the nursing     staff.  He should notify the CVTS Office if he has any increased     drainage, redness, or purulence from his wound.   FOLLOWUP:  1. With Dr. [name] within two weeks of discharge.  The CVTS Office will     call.  The patient will set the     appointment date and time.  2. Dr. Clelia Croft within four weeks of discharge.  The patient should call and     make an appointment with Dr. Alver Fisher office.  Carolyn A. Eustaquio Boyden.   CAF/MEDQ  D:  06/16/2003  T:  06/17/2003   Job:  [id number]   cc:   Dr. Clelia Croft

## 2010-09-25 NOTE — H&P (Signed)
[name]. [facility]  Patient:    Julian [name], Julian [name] Visit Number: [id number] MRN: [id number]          Service Type: MED Location: 3000 3005 01 Attending Physician:  Bennye Alm Dictated by:   Dominica Severin, P.A. Admit Date:  05/14/2001   CC:         CVTS office  Dr. Lambert Mody in [place], [place]   History and Physical  DATE OF BIRTH:  Dec 19, 1945  CHIEF COMPLAINT:  Peripheral vascular occlusive disease, left gangrenous fourth toe.  HISTORY OF PRESENT ILLNESS:  This is a 65 year old African-American male who had recently seen Dr. [name] on Thursday, May 11, 2001, after being referred by Dr. Lambert Mody.  The patient went to [facility] last week.  He was in the hospital being treated for an infection of the left foot.  He was placed on antibiotics.  He was discharged and referred by Dr. Lambert Mody for a vascular evaluation here in [place].  On the patients preoperative lab work, the patients BUN and creatinine was elevated.  The patient was to be admitted this evening for IV hydration and a plan to undergo an aortofemoral angiogram in the morning if the patients renal function is stable.  The patient states that he has been having these slow healing ulcers with gangrene for an unknown amount of time.  He does describe ischemic changes as well as claudication symptoms as well as peripheral edema and decreased temperature in his lower extremities.  The patient denies any buttock, hip, or thigh pain, calf pain, or foot pain.  He does state he occasionally gets rest pain and night pain associated with some numbness and tingling down his leg.  Denies any shortness of breath, dyspnea on exertion after walking, or any chest pain or palpitations.  PAST MEDICAL HISTORY: 1. Peripheral vascular occlusive disease. 2. Type 2 diabetes mellitus. 3. Hypertension. 4. Obesity. 5. History of pneumonia requiring hospitalization approximately three years    ago  at [facility]  PAST SURGICAL HISTORY: 1. Left eye surgery in 2000. 2. Right hand surgery with injuries to his first and second digits after    having a tree limb falling on him.  MEDICATIONS PRIOR TO ADMISSION: 1. Norvasc 5 mg twice a day. 2. Actos 45 mg once a day.  ALLERGIES:  No known drug allergies at this time.  REVIEW OF SYSTEMS:  Please see HPI and past medical history for significant positives.  Otherwise, the patient denies any history of diabetes, renal dysfunction, or asthma.  FAMILY HISTORY:  The patient states his mother is alive and well at 66.  His father is deceased from coronary artery disease at 86 years old.  Brothers and sisters are all alive and well, although he does not know their health history.  SOCIAL HISTORY:  The patient is single.  Does not have any children.  He has been disabled since 1987 secondary to his diabetes he states.  Denies any alcohol or tobacco use.  PHYSICAL EXAMINATION:  VITAL SIGNS:  Please see chart for details.  GENERAL:  This is a pleasant 65 year old African-American male in no acute distress.  He was alert and oriented x 3, sitting upright in the hospital bed at [facility].  HEENT:  Head is normocephalic and atraumatic.  Eyes - PERRLA/EOMI without cataracts, glaucoma, or macular degeneration.  NECK:  Supple without JVD or lymphadenopathy.  He does have a left bruit which is heard over his carotid.  It  is a blowing soft bruit.  CHEST:  Symmetrical on inspiration and expiration.  LUNGS:  Are without wheezes, rhonchi, rales, or lymphadenopathy.  HEART:  Regular rate and rhythm, again a questionable systolic ejection murmur radiating to the left carotid without any rubs or gallops.  ABDOMEN:  Soft and nontender with positive bowel sounds x 4 quadrants without masses or bruits.  GENITOURINARY:  Deferred.  RECTAL:  Deferred.  EXTREMITIES:  Without clubbing.  He does have some mild cyanosis in the top  of his left foot.  He does have bilateral lower extremity edema.  He has an ulceration on his right lower extremity and anterior part of his lower leg which is approximately 2.5 x 2.5 cm in diameter, possibly a venostasis ulcer is just right below the skin without any drainage.  The patients left foot has a dry gangrenous fourth toe with some purulent material under the dorsum of the foot under the skin.  Did not have any drainage or odor at this time. Temperature on his left foot is decreased.  He has 2+ carotid and femoral pulses bilaterally.  He has a 1+ popliteal pulse bilaterally.  He does not have any palpable dorsalis pedis and posterior tibial pulses bilaterally.  NEUROLOGIC:  The patient does not have any focal deficits.  He is alert and oriented x 3.  He has 2+ deep tendon reflexes.  He has a steady gait with 2+ muscle strength.  Of note, also on the patients extremities, he has multiple scars on his left lower extremity which the patient states are due to various scratches and injuries to his leg before, but he denies any history of ulcers prior to this date.  ASSESSMENT AND PLAN:  This is a 65 year old pleasant African-American male with peripheral vascular occlusive disease, questionable diabetic ulcers on his right lower extremity and gangrenous left four toe.  Dr. [name] has seen and evaluated this patient prior to his admission, and has explained the risks and benefits of the procedure, and the patient has agreed to continue. Dictated by:   Dominica Severin, P.A. Attending Physician:  Bennye Alm DD:  05/14/01 TD:  05/15/01 Job: [id number]

## 2010-09-25 NOTE — Op Note (Signed)
[place]. [facility]  Patient:    Julian [name], Julian [name] Visit Number: [id number] MRN: [id number]          Service Type: MED Location: [id number] Attending Physician:  Bennye Alm Dictated by:   Di Kindle. [name], M.D. Proc. Date: 05/16/01 Admit Date:  05/14/2001   CC:         Kirstie Peri, M.D. [address] 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  PROCEDURE: 1. Left popliteal to dorsalis pedis artery bypass with a non-reversed    translocated saphenous vein graft. 2. Intraoperative arteriogram. 3. Left open third, fourth, and fifth toe amputations.  SURGEON:  Di Kindle. [name], M.D.  ASSISTANT: 1. Larina Earthly, M.D. 2. [address] [address]  ANESTHESIA:  General.  INDICATIONS FOR PROCEDURE:  This is a 65 year old gentleman who presented to the office with a gangrenous wound of his left foot.  He was brought in and placed on intravenous antibiotics, and arteriogram was obtained which showed patent superficial femoral and popliteal arteries with severe tibial occlusive disease.  He reconstituted his dorsalis pedis artery in the foot, and it was felt that bypass to this vessel was his only chance for limb salvage.  The procedure and potential complications, including, but not limited to, bleeding, wound healing problems, graft thrombosis, and limb wash were discussed with the patient.  In addition, we discussed possibly removing the third and fifth toes in addition to the fourth toes, as these two appeared to be getting involved with the infection.  DESCRIPTION OF PROCEDURE:  The patient was taken to the operating room, and received a general anesthetic.  The entire left lower extremity was prepped and draped in the usual sterile fashion.  I initially made a longitudinal incision over the dorsalis pedis  artery after it was identified with the Doppler, and here the artery was dissected free.  It was small, but appeared to be patent, and did have Doppler flow within in.  For this reason, it was felt that this would be a reasonable outflow vessel.  We therefore proceeded with the planned bypass.  A separate longitudinal incision was made beneath the knee, and through this incision the saphenous vein was dissected out with branches divided between clips and 3-0 silk ties.  Two additional incisions were made further proximally in the left thigh medially, and enough vein was dissected out for the bypass.  Branches were divided between clips and 3-0 silk ties.  The vein was removed in its entirety, and gently extended up with heparinized saline.  It was stored in papaverine.  Through the incision below the knee, the below knee popliteal artery was exposed.  Next, I did create one incision at the level of the ankle to tunnel, and tunneled up to the below knee popliteal artery subfascially.  Next, the patient was heparinized.  The tourniquet was placed on the upper thigh.  The leg was exsanguinated with an Esmarch bandage.  A longitudinal arteriotomy was made, and the below knee popliteal artery, which was widely patent.  The vein graft was spatulated and sewn end-to-side to the below knee popliteal artery using continuous 6-0 Prolene sutures.  Next, the tourniquet was released, the vessels backbled and flushed appropriately, and the anastomosis completed.  Flow was reestablished to the leg.  The valves were then sharply excised with a retrograde Mills valvulotome.  The  graft was then marked to prevent twisting, and then brought through the previously grated tunnel after it was flushed and it had good flow through it.  The tourniquet had been released.  The graft was then tunneled to the incision over the dorsalis pedis artery.  The leg was then exsanguinated again with the Esmarch bandage, and the  tourniquet inflated to 250 mmHg. Under tourniquet control, a longitudinal arteriotomy was made in the dorsalis pedis artery.  The vein graft was cut to the appropriate length, spatulated, and sewn end-to-side to the dorsalis pedis artery using two continuous 6-0 Prolene sutures, parachuting both the heel and toe of the anastomosis.  Prior to completing anastomosis, the tourniquet was released, and the vessels backbled and flushed appropriately, and the anastomosis completed.  Flow was reestablished to the left foot.  There was an excellent pulse in the graft. Next, hemostasis was obtained, and the heparin was partially reversed with Protamine.  The two distal incisions were closed with interrupted 3-0 nylon vertical mattress sutures.  The remaining incisions were closed with a deep layer of 2-0 Vicryl, subcutaneous layer of 2-0 Vicryl, and the skin closed with staples.  Sterile dressings were applied on these wounds, and then the foot was re-prepped and draped.  The fourth toe was clearly gangrenous and black, and this was excised circumferentially, and then the bone divided and the toe removed.  I then debrided all devitalized tissue, and it was clear that the third and fifth toes were also involved.  The bone of the fifth toe was involved, and the necrotic tissue essentially surrounded the third toe. Therefore, these toes were also excised.  All devitalized tissue was debrided. Hemostasis was obtained of the wound.  The bone was rongeured back to healthy appearing bone.  Hemostasis was obtained.  The wound was irrigated with copious amounts of antibiotic solution.  The wound was then packed open.  All needle and sponge counts were correct.  The patient tolerated the procedure well, was transferred to the recovery room in satisfactory condition.  All needle and sponge counts were correct. Dictated by:   Di Kindle [name], M.D. Attending Physician:  Bennye Alm DD:   05/16/01 TD:  05/16/01 Job: [id number]

## 2010-11-12 ENCOUNTER — Ambulatory Visit (HOSPITAL_COMMUNITY): Payer: Medicare Other

## 2010-11-12 ENCOUNTER — Ambulatory Visit (HOSPITAL_COMMUNITY)
Admission: AD | Admit: 2010-11-12 | Discharge: 2010-11-12 | Disposition: A | Discharge status: Home / Self Care | Payer: Medicare Other | Source: Ambulatory Visit | Attending: Vascular Surgery | Admitting: Vascular Surgery

## 2010-11-12 DIAGNOSIS — N186 End stage renal disease: Secondary | ICD-10-CM

## 2010-11-12 DIAGNOSIS — T82898A Other specified complication of vascular prosthetic devices, implants and grafts, initial encounter: Secondary | ICD-10-CM

## 2010-11-12 DIAGNOSIS — S78119A Complete traumatic amputation at level between unspecified hip and knee, initial encounter: Secondary | ICD-10-CM | POA: Insufficient documentation

## 2010-11-12 DIAGNOSIS — Z01810 Encounter for preprocedural cardiovascular examination: Secondary | ICD-10-CM | POA: Insufficient documentation

## 2010-11-12 DIAGNOSIS — I120 Hypertensive chronic kidney disease with stage 5 chronic kidney disease or end stage renal disease: Secondary | ICD-10-CM | POA: Insufficient documentation

## 2010-11-12 DIAGNOSIS — Z79899 Other long term (current) drug therapy: Secondary | ICD-10-CM | POA: Insufficient documentation

## 2010-11-12 DIAGNOSIS — I739 Peripheral vascular disease, unspecified: Secondary | ICD-10-CM | POA: Insufficient documentation

## 2010-11-12 DIAGNOSIS — Z01818 Encounter for other preprocedural examination: Secondary | ICD-10-CM | POA: Insufficient documentation

## 2010-11-12 DIAGNOSIS — Z992 Dependence on renal dialysis: Secondary | ICD-10-CM | POA: Insufficient documentation

## 2010-11-12 DIAGNOSIS — Z01812 Encounter for preprocedural laboratory examination: Secondary | ICD-10-CM | POA: Insufficient documentation

## 2010-11-12 LAB — POCT I-STAT 4, (NA,K, GLUC, HGB,HCT)
Glucose, Bld: 94 mg/dL (ref 70–99)
HCT: 36 % — ABNORMAL LOW (ref 39.0–52.0)
Potassium: 6 mEq/L — ABNORMAL HIGH (ref 3.5–5.1)
Sodium: 140 mEq/L (ref 135–145)

## 2010-11-12 LAB — GLUCOSE, CAPILLARY: Glucose-Capillary: 105 mg/dL — ABNORMAL HIGH (ref 70–99)

## 2010-11-19 NOTE — Op Note (Signed)
Julian [name], Julian [name] NO.:  [id number]  MEDICAL RECORD NO.:  [id number]  LOCATION:  [facility]                         FACILITY:  [facility]  PHYSICIAN:  Fransisco Hertz, MD       DATE OF BIRTH:  25-Feb-1946  DATE OF PROCEDURE:  11/12/2010 DATE OF DISCHARGE:                              OPERATIVE REPORT   PROCEDURES: 1. Placement of a right internal jugular vein tunneled dialysis     catheter. 2. Cannulation of right internal jugular vein under ultrasound     guidance.  PREOPERATIVE DIAGNOSIS:  End-stage renal disease requiring hemodialysis.  POSTOPERATIVE DIAGNOSIS:  End-stage renal disease requiring hemodialysis.  SURGEON:  Arlys John L. Imogene Burn, MD  ANESTHESIA:  MAC and local.  FINDINGS:  Tips of the catheter in the right atrium.  SPECIMENS:  None.  ESTIMATED BLOOD LOSS:  Minimal.  INDICATIONS:  This is a 65 year old gentleman with end-stage renal disease, last dialyzed on Monday via a right upper arm access which is thrombosed at this point.  The patient presents for placement of a tunneled dialysis catheter.  The patient is aware of the risks of this procedure including bleeding, infection, possible central venous injury, and possible pneumothorax.  He is aware of these risks and agreed to proceed forward.  DESCRIPTION OF OPERATION:  After full informed written consent was obtained from the patient, he was brought back to the operating room and placed supine upon the operating table.  Prior to induction, he received IV antibiotics.  After obtaining adequate sedation, he was prepped and draped in standard fashion for a neck or chest tunneled dialysis catheter placement.  Under ultrasound guidance, we identified the right internal jugular vein that was cannulated with 18-gauge needle.  Wire passed down into the inferior vena cava.  The wire was clamped to the drapes.  I then injected a total about 30 mL of 1:1 mix of 0.5% Marcaine without epinephrine and 1%  lidocaine with epinephrine to obtain anesthesia at the neck cannulation site, the chest exit site, and the subcutaneous tunnel.  Then we made stab incisions at the exit site and the neck cannulation site and passed a metal tunneler from the chest to the neck and dilated this tract with a plastic dilator.  At this point, I unclamped the wire and attempted to pass the first dilator, however, due to the patient's excess adipose tissue in his neck the wire kept bending.  Subsequently, I felt that a stiff wire was going to be necessary.  A 4-French end-hole catheter was loaded over the wire and advanced down into the inferior vena cava.  The wire was exchanged for a Amplatz Super Stiff wire which was placed into the inferior vena cava under fluoroscopic guidance.  I then removed the catheter and then loaded a medium dilator over this wire, advanced without any difficulties into the superior vena cava, and then removed this and placed the dilator sheath over the wire.  Under fluoroscopic guidance, guided this into the superior vena cava.  The wire and dilator were removed.  Then obtained a 23-cm Diatek catheter that was loaded through the peel-away sheath down into the right atrium.  I then broke the sheath  and peeled away the sheath while holding the cuff of the catheter at the level of skin.  Then the back end of this catheter was connected to metal tunneler and passed in a retrograde fashion through the tunnel. This was pulled to the appropriate location with the cuff about a centimeter away from the exit site.  I then transected back in this catheter revealing the two lumens of this catheter.  Two ports were docked onto this and then the catheter hub screwed in place.  I then tested each port by aspirating and flushing.  There was no resistance noted.  I filled each port with heparinized saline and then the catheter was secured in place with 2 interrupted stitches of nylon tied to  the catheter.  The neck incision was repaired with a U stitch of 4-0 Monocryl and then Dermabond used to reinforce this closure.  On final inspection on fluoroscopy, the tip of the catheter were in the right atrium and there was no obvious kinks within this catheter.  The patient tolerated this procedure well and plan is to let him go home after possible dialysis.  COMPLICATIONS:  None.  CONDITION:  Stable.     Fransisco Hertz, MD     BLC/MEDQ  D:  11/12/2010  T:  11/13/2010  Job:  [id number]  Electronically Signed by [name] MD on 11/19/2010 10:13:42 AM

## 2010-12-01 NOTE — Progress Notes (Signed)
[facility] [place]  Established Dialysis Access  History of Present Illness  Julian [name] is a 65 y.o. male who presents for re-evaluation for permanent access.  The patient is right hand dominant.  Previous access procedures have been completed in the Right arm.  The patient's complication from previous access procedures include: thrombosis.  The patient denies any steal symptoms or any neurologic losses.  Past Medical History, Past Surgical History, Social History, Family History, Medications, Allergies, and Review of Systems are unchanged from previous visit.  Physical Examination  Filed Vitals:   12/04/10 1308  BP: 94/66  Pulse: 82  Temp: 97.9 F (36.6 C)    General: A&O x 3, WDWN  Pulmonary: Sym exp, good air movt, CTAB, no rales, rhonchi, & wheezing  Cardiac: RRR, Nl S1, S2, no Murmurs, rubs or gallops  Gastrointestinal: soft, NTND, -G/R, - HSM, - masses, - CVAT B   Musculoskeletal: M/S 5/5 throughout , Extremities without  ischemic changes, palpable thrombosed RUA AVG  Neurologic: CN 2-12 intact, Pain and light touch intact in extremities , Motor exam as listed above  Non-Invasive Vascular Imaging  BUE Vein Mapping  ordered  Medical Decision Making  Julian [name] is a 65 y.o. male who presents with ESRD requiring hemodialysis. His RUA AVG occluded recently and he needs evaluation for new permanent access.  BUE vein mapping has been ordered to help determine his next option.  Unfortunately, previous vein mapping did not include basilic vein so I do not know if he has a BVT option.  This study will be obtained in the next 2-4 weeks after which he will follow up with [place].  [name], MD [facility] [place] Office: [phone] Pager: [phone]

## 2010-12-04 ENCOUNTER — Ambulatory Visit (INDEPENDENT_AMBULATORY_CARE_PROVIDER_SITE_OTHER): Payer: Medicare Other | Admitting: Vascular Surgery

## 2010-12-04 ENCOUNTER — Encounter: Payer: Self-pay | Admitting: Vascular Surgery

## 2010-12-04 VITALS — BP 94/66 | HR 82 | Temp 97.9°F

## 2010-12-04 DIAGNOSIS — N186 End stage renal disease: Secondary | ICD-10-CM | POA: Insufficient documentation

## 2010-12-04 DIAGNOSIS — Z992 Dependence on renal dialysis: Secondary | ICD-10-CM | POA: Insufficient documentation

## 2010-12-04 NOTE — Progress Notes (Signed)
Postop IJ cath, Discuss possible revision of Right AVF.    Has HD on Monday, Wednesday, and Friday.

## 2010-12-16 NOTE — Progress Notes (Signed)
[facility] [place]  Established Dialysis Access  History of Present Illness  Julian [name] is a 65 y.o. male who presents for re-evaluation after BUE vein mapping.    Past Medical History, Past Surgical History, Social History, Family History, Medications, Allergies, and Review of Systems are unchanged from previous visit.  Physical Examination  Filed Vitals:   12/18/10 1520  BP: 128/76  Pulse: 80  Resp: 18    General: A&O x 3, WDWN  Pulmonary: Sym exp, good air movt, CTAB, no rales, rhonchi, & wheezing  Cardiac: RRR, Nl S1, S2, no Murmurs, rubs or gallops  Musculoskeletal: BUE M/S 5/5 throughout, Extremities without  ischemic changes   Neurologic: Pain and light touch intact in BUE, Motor exam as listed above  Non-Invasive Vascular Imaging  Vein Mapping  (Date: 12/18/10):   L arm: acceptable vein conduits include L basilic vein  Medical Decision Making  Julian [name] is a 65 y.o. male who presents with  ESRD requiring hemodialysis.   Based on vein mapping and examination, this patient's permanent access options include: L 1st BVT  I had an extensive discussion with this patient in regards to the nature of access surgery, including risk, benefits, and alternatives.    The patient is aware that the risks of access surgery include but are not limited to: bleeding, infection, steal syndrome, nerve damage, ischemic monomelic neuropathy, failure of access to mature, and possible need for additional access procedures in the future.  The patient has agreed to proceed with the above procedure which will be scheduled 21 AUG 12.  [name], MD [facility] [place] Office: [phone] Pager: [phone]

## 2010-12-18 ENCOUNTER — Encounter: Payer: Self-pay | Admitting: Vascular Surgery

## 2010-12-18 ENCOUNTER — Ambulatory Visit (INDEPENDENT_AMBULATORY_CARE_PROVIDER_SITE_OTHER): Payer: Medicaid Other | Admitting: Vascular Surgery

## 2010-12-18 ENCOUNTER — Encounter (INDEPENDENT_AMBULATORY_CARE_PROVIDER_SITE_OTHER): Payer: Medicaid Other

## 2010-12-18 VITALS — BP 128/76 | HR 80 | Resp 18 | Ht 70.0 in | Wt 220.0 lb

## 2010-12-18 DIAGNOSIS — Z01810 Encounter for preprocedural cardiovascular examination: Secondary | ICD-10-CM

## 2010-12-18 DIAGNOSIS — N186 End stage renal disease: Secondary | ICD-10-CM

## 2010-12-29 ENCOUNTER — Ambulatory Visit (HOSPITAL_COMMUNITY)
Admission: RE | Admit: 2010-12-29 | Discharge: 2010-12-29 | Disposition: A | Discharge status: Home / Self Care | Payer: Medicare Other | Source: Ambulatory Visit | Attending: Vascular Surgery | Admitting: Vascular Surgery

## 2010-12-29 DIAGNOSIS — N186 End stage renal disease: Secondary | ICD-10-CM

## 2010-12-29 DIAGNOSIS — I120 Hypertensive chronic kidney disease with stage 5 chronic kidney disease or end stage renal disease: Secondary | ICD-10-CM

## 2010-12-29 DIAGNOSIS — E119 Type 2 diabetes mellitus without complications: Secondary | ICD-10-CM | POA: Insufficient documentation

## 2010-12-29 DIAGNOSIS — Z992 Dependence on renal dialysis: Secondary | ICD-10-CM | POA: Insufficient documentation

## 2010-12-29 DIAGNOSIS — Z01812 Encounter for preprocedural laboratory examination: Secondary | ICD-10-CM | POA: Insufficient documentation

## 2010-12-29 LAB — POCT I-STAT 4, (NA,K, GLUC, HGB,HCT)
Glucose, Bld: 89 mg/dL (ref 70–99)
HCT: 35 % — ABNORMAL LOW (ref 39.0–52.0)
Hemoglobin: 11.9 g/dL — ABNORMAL LOW (ref 13.0–17.0)
Potassium: 4.6 mEq/L (ref 3.5–5.1)
Sodium: 138 mEq/L (ref 135–145)

## 2010-12-29 LAB — SURGICAL PCR SCREEN: Staphylococcus aureus: NEGATIVE

## 2010-12-29 LAB — GLUCOSE, CAPILLARY
Glucose-Capillary: 86 mg/dL (ref 70–99)
Glucose-Capillary: 87 mg/dL (ref 70–99)

## 2011-01-01 NOTE — Procedures (Unsigned)
CEPHALIC VEIN MAPPING  INDICATION:  Left cephalic and basilic vein mapping for AV fistula placement.  HISTORY: Failed right AVG, end-stage renal disease.  EXAM: The right basilic was not visualized.  The left cephalic vein is compressible with diameter measurements ranging from 0.33 to 0.11 cm.  The left basilic vein is compressible with diameter measurements ranging from 0.46 to 0.31 cm.  See attached worksheet for all measurements.  IMPRESSION:  Patent left cephalic and basilic veins with diameter measurements as described above.  ___________________________________________ Fransisco Hertz, MD  EM/MEDQ  D:  12/18/2010  T:  12/18/2010  Job:  [id number]

## 2011-01-05 NOTE — Op Note (Signed)
NAMECOYT, Julian [name] NO.:  [id number]  MEDICAL RECORD NO.:  [id number]  LOCATION:  [facility]                         FACILITY:  [facility]  PHYSICIAN:  Fransisco Hertz, MD       DATE OF BIRTH:  July 10, 1945  DATE OF PROCEDURE:  12/29/2010 DATE OF DISCHARGE:  12/29/2010                              OPERATIVE REPORT   PROCEDURE:  left first stage basilic vein transposition.  PREOPERATIVE DIAGNOSIS:  End-stage renal disease.  POSTOPERATIVE DIAGNOSIS:  End-stage renal disease.  SURGEON:  Arlys John L. Imogene Burn, MD.  ANESTHESIA:  Monitored anesthesia care and local.  FINDINGS:  Atherosclerotic brachial artery and sclerotic basilic vein. There was a dopplerable radial signal at the end of the case and a dopplerable venous outflow signal that is consistent with a widely patent  fistula.  Specimens were none.  Estimated blood loss minimal.  INDICATIONS:  This is a 65 year old gentleman with end-stage renal disease.  Multiple accesses have been completed which failed.  Most recently he came to the office and underwent vein mapping.  He was found to have a left basilic vein that was adequate size for use for a stage transposition.  He agreed to proceed forward this procedure.  He is aware of the risks of this procedure included bleeding, infection, steal syndrome, ischemic monomelic neuropathy, possible nerve damage, possible failure to mature and possible need for additional procedure.  He is aware of these risks and agreed to proceed forward.  DESCRIPTION OF THE OPERATION:  After full informed written consent was obtained from the patient, he was brought back to the operating room,and placed supine upon the operating room table.  Prior to induction, he received IV antibiotics.  After obtaining adequate sedation, he was prepped and draped in the standard fashion for left arm access procedure. Under SonoSite guidance, I previously marked out the brachial artery and the basilic vein.   Due to the distance between the two vessels, I felt that a transverse incision was going to be necessary to bridge the distance.  I anesthetized the antecubitum with about a total of 10 mL of one-to-one mixture of 0.5% Marcaine withou epinephrine with 1% lidocaine with epinephrine.  I then made a transverse incision near the antecubitum, then dissected down to the brachial artery.  There was a significant amount of fat that had to be mobilized before I could eventually get down to the brachial artery that was atherosclerotic.  It was dissected out proximally and distally.  Vessel loops were applied to gain control of this artery.  I then dissected medially toward the basilic vein. Eventually, I was able to dissect out the cubital branch going into the basilic vein.  The cubital branch was less than 2 mm and deemed not usable. I ligated this off and transected this branch.  I tied off the distal basilic  vein and transected the distal basilic vein.  I interrogated the proximal vein  with serial dilators up to a 4 mm dilator, but clearly there was evidence of either a sclerotic valve or a sclerotic vein segment.  But as the 4-mm dilator passed, I felt it was acceptable to make an attempt at the basilic vein  transposition on this patient.  I instilled heparinized saline into this vein and then placed the  brachial artery under tension proximally and distally and then made an arteriotomy  with an 11 blade and extended it with a Potts scissor for about a 4-mm arteriotomy. Note, this artery was atherosclerotic and calcified in some locations but I felt it was adequate for use for attempted fistula.  I then at this point sewed the vein to the artery in end-to-side configuration with a running stitch of 7-0 Prolene.  Prior to completing anastomosis, I allowed the artery proximally and distally to back bleed and the vein to back bleed. There was no clot in any location.  I completed anastomosis in  the usual fashion.  Then I released the clamps.  Immediately there was a pulse within the outflow.  By the end of the case, there was a faint thrill in the upper arm.  I then listened at the level of the risk for a radial artery.  There was good multiphasic signal at the wrist.  Also there was a multiphasic signal proximal anastomosis to distal anastomosis.  There are multiphasic signals but there was a water-hammer signal. However, clearly when I retested the anastomosis prior to completing it, there was a good backbleeding from the distal artery, so while the Doppler suggest there was some degree of stenosis distally.  It was not enough to immediately abandon this fistula.  In the venous outflow also the continuous Doppler signal was consistent with widely patent arteriovenous fistula.  There was a slight arterial component to it, so this access is a high risk for potentially thrombosing due to the presence of sclerosis in this fistula.  At this point I irrigated out the wound.  There was no more active bleeding.  I reapproximated the subcu tissue with 3-0 Vicryl.  The skin was then reapproximated with running subcuticular 4-0 Monocryl.  The skin was then cleaned, dried and reinforced with Dermabond.  The patient tolerated this procedure well.  COMPLICATIONS:  None.  CONDITION:  Stable.     Fransisco Hertz, MD     BLC/MEDQ  D:  12/29/2010  T:  12/29/2010  Job:  [id number]  Electronically Signed by [name] MD on 01/05/2011 09:01:47 PM

## 2011-01-25 ENCOUNTER — Encounter: Payer: Self-pay | Admitting: Vascular Surgery

## 2011-01-28 ENCOUNTER — Encounter: Payer: Self-pay | Admitting: Vascular Surgery

## 2011-01-28 LAB — CULTURE, BLOOD (ROUTINE X 2): Culture: NO GROWTH

## 2011-01-28 LAB — RENAL FUNCTION PANEL
Albumin: 1.5 — ABNORMAL LOW
BUN: 36 — ABNORMAL HIGH
BUN: 46 — ABNORMAL HIGH
CO2: 25
CO2: 25
Calcium: 7.8 — ABNORMAL LOW
Calcium: 8.1 — ABNORMAL LOW
Calcium: 8.3 — ABNORMAL LOW
Chloride: 92 — ABNORMAL LOW
Creatinine, Ser: 6.01 — ABNORMAL HIGH
Creatinine, Ser: 7.69 — ABNORMAL HIGH
GFR calc non Af Amer: 10 — ABNORMAL LOW
Glucose, Bld: 109 — ABNORMAL HIGH
Glucose, Bld: 132 — ABNORMAL HIGH
Glucose, Bld: 172 — ABNORMAL HIGH
Phosphorus: 2.5
Phosphorus: 7.7 — ABNORMAL HIGH
Sodium: 129 — ABNORMAL LOW

## 2011-01-28 LAB — BASIC METABOLIC PANEL
BUN: 39 — ABNORMAL HIGH
BUN: 41 — ABNORMAL HIGH
CO2: 27
CO2: 24
CO2: 25
Calcium: 8.2 — ABNORMAL LOW
Calcium: 8.7
Calcium: 8.5
Calcium: 8.6
Chloride: 92 — ABNORMAL LOW
Chloride: 93 — ABNORMAL LOW
Creatinine, Ser: 7.4 — ABNORMAL HIGH
Creatinine, Ser: 8.58 — ABNORMAL HIGH
GFR calc Af Amer: 29 — ABNORMAL LOW
GFR calc Af Amer: 11 — ABNORMAL LOW
GFR calc Af Amer: 8 — ABNORMAL LOW
GFR calc non Af Amer: 24 — ABNORMAL LOW
GFR calc non Af Amer: 7 — ABNORMAL LOW
GFR calc non Af Amer: 8 — ABNORMAL LOW
Glucose, Bld: 210 — ABNORMAL HIGH
Glucose, Bld: 265 — ABNORMAL HIGH
Glucose, Bld: 192 — ABNORMAL HIGH
Glucose, Bld: 242 — ABNORMAL HIGH
Glucose, Bld: 308 — ABNORMAL HIGH
Potassium: 4.3
Potassium: 4
Potassium: 4.5
Sodium: 129 — ABNORMAL LOW
Sodium: 129 — ABNORMAL LOW

## 2011-01-28 LAB — CBC
HCT: 33.2 — ABNORMAL LOW
HCT: 34.1 — ABNORMAL LOW
HCT: 38.3 — ABNORMAL LOW
HCT: 26 — ABNORMAL LOW
HCT: 28.2 — ABNORMAL LOW
HCT: 29.7 — ABNORMAL LOW
HCT: 31 — ABNORMAL LOW
Hemoglobin: 10.8 — ABNORMAL LOW
Hemoglobin: 8.2 — ABNORMAL LOW
Hemoglobin: 8.7 — ABNORMAL LOW
Hemoglobin: 9 — ABNORMAL LOW
Hemoglobin: 8.9 — ABNORMAL LOW
Hemoglobin: 9.6 — ABNORMAL LOW
MCHC: 31.4
MCHC: 31.6
MCHC: 31.6
MCHC: 31.7
MCHC: 32.4
MCHC: 30.8
MCHC: 31.1
MCHC: 31.4
MCHC: 31.8
MCV: 87.9
MCV: 89.4
MCV: 89.3
Platelets: 249
Platelets: 251
Platelets: 251
RBC: 3.83 — ABNORMAL LOW
RBC: 3.17 — ABNORMAL LOW
RBC: 3.49 — ABNORMAL LOW
RDW: 17.6 — ABNORMAL HIGH
RDW: 17.9 — ABNORMAL HIGH
RDW: 18.6 — ABNORMAL HIGH
RDW: 19 — ABNORMAL HIGH
RDW: 19.8 — ABNORMAL HIGH
RDW: 17.3 — ABNORMAL HIGH
RDW: 17.7 — ABNORMAL HIGH
RDW: 18.6 — ABNORMAL HIGH
WBC: 13.1 — ABNORMAL HIGH

## 2011-01-28 LAB — CROSSMATCH
ABO/RH(D): O POS
Antibody Screen: NEGATIVE

## 2011-01-28 LAB — GLUCOSE, RANDOM: Glucose, Bld: 357 — ABNORMAL HIGH

## 2011-01-28 LAB — DIFFERENTIAL
Basophils Absolute: 0
Basophils Absolute: 0
Basophils Absolute: 0.1
Basophils Absolute: 0
Basophils Absolute: 0
Basophils Absolute: 0.1
Basophils Relative: 0
Basophils Relative: 0
Basophils Relative: 0
Basophils Relative: 0
Basophils Relative: 0
Basophils Relative: 0
Eosinophils Absolute: 0
Eosinophils Absolute: 0.2
Eosinophils Relative: 0
Eosinophils Relative: 0
Eosinophils Relative: 1
Eosinophils Relative: 1
Eosinophils Relative: 2
Lymphocytes Relative: 9 — ABNORMAL LOW
Lymphocytes Relative: 9 — ABNORMAL LOW
Lymphocytes Relative: 9 — ABNORMAL LOW
Lymphs Abs: 1.5
Monocytes Absolute: 1
Monocytes Absolute: 1.3 — ABNORMAL HIGH
Monocytes Absolute: 0.7
Monocytes Absolute: 1
Monocytes Absolute: 1.1 — ABNORMAL HIGH
Monocytes Relative: 6
Monocytes Relative: 8
Monocytes Relative: 6
Monocytes Relative: 6
Neutro Abs: 10.8 — ABNORMAL HIGH
Neutro Abs: 17.7 — ABNORMAL HIGH
Neutro Abs: 7.5
Neutro Abs: 13.8 — ABNORMAL HIGH
Neutro Abs: 14.4 — ABNORMAL HIGH
Neutro Abs: 17.8 — ABNORMAL HIGH
Neutrophils Relative %: 91 — ABNORMAL HIGH

## 2011-01-28 LAB — COMPREHENSIVE METABOLIC PANEL
ALT: 15
AST: 19
Albumin: 1.6 — ABNORMAL LOW
Alkaline Phosphatase: 77
BUN: 38 — ABNORMAL HIGH
Chloride: 94 — ABNORMAL LOW
Potassium: 4.5
Sodium: 126 — ABNORMAL LOW
Total Bilirubin: 0.7

## 2011-01-28 LAB — WOUND CULTURE

## 2011-01-29 ENCOUNTER — Ambulatory Visit (INDEPENDENT_AMBULATORY_CARE_PROVIDER_SITE_OTHER): Payer: Medicare Other | Admitting: Vascular Surgery

## 2011-01-29 ENCOUNTER — Encounter: Payer: Self-pay | Admitting: Vascular Surgery

## 2011-01-29 VITALS — BP 172/82 | HR 66 | Resp 20 | Ht 70.0 in | Wt 220.0 lb

## 2011-01-29 DIAGNOSIS — N186 End stage renal disease: Secondary | ICD-10-CM

## 2011-01-29 NOTE — Progress Notes (Signed)
[facility] [place]  Postoperative Access Visit  History of Present Illness  Julian [name] is a 65 y.o. year old male who presents for postoperative follow-up for: L 1st stage BVT (Date: 12/29/10).  The patient's wounds are healed.  The patient notes no steal symptoms.  The patient is able to complete their activities of daily living.  The patient's current symptoms are: mild pain at the site of the [facility].  Physical Examination  Filed Vitals:   01/29/11 1132  BP: 172/82  Pulse: 66  Resp: 20   LUE: Incision is healed, skin feels warm, hand grip is 5/5, sensation in digits is intact, palpable faint thrill, bruit can be auscultated, on sonosite, fistula is 5 mm -7 mm except at the distal extend where it appears 3-4 mm, the fistula is quite deep > 1.5 cm at some locations  Medical Decision Making  Julian [name] is a 65 y.o. year old male who presents s/p successful left 1st stage BVT.  I would give the BVT another 2-3 weeks and have him come back for L access duplex to formally evaluate the BVT  Likely will schedule 2nd stage at that time with anticipated use of the fistula in 1.5 months  Thank you for allowing [place] to participate in this patient's care.  [name], MD [facility] [place] Office: [phone] Pager: [phone]

## 2011-02-15 LAB — HEMOGLOBIN AND HEMATOCRIT, BLOOD: HCT: 42.4

## 2011-02-15 LAB — BASIC METABOLIC PANEL
CO2: 27
GFR calc Af Amer: 13 — ABNORMAL LOW
GFR calc non Af Amer: 10 — ABNORMAL LOW
Glucose, Bld: 157 — ABNORMAL HIGH
Potassium: 4
Sodium: 136

## 2011-02-16 LAB — DIFFERENTIAL
Basophils Absolute: 0
Basophils Relative: 0
Eosinophils Absolute: 0.1 — ABNORMAL LOW
Eosinophils Relative: 1
Lymphocytes Relative: 18
Monocytes Absolute: 0.7

## 2011-02-16 LAB — COMPREHENSIVE METABOLIC PANEL
AST: 15
Albumin: 2.4 — ABNORMAL LOW
Alkaline Phosphatase: 63
CO2: 27
Chloride: 103
Creatinine, Ser: 5.99 — ABNORMAL HIGH
GFR calc Af Amer: 12 — ABNORMAL LOW
GFR calc non Af Amer: 10 — ABNORMAL LOW
Potassium: 4.1
Total Bilirubin: 0.5

## 2011-02-16 LAB — CBC
HCT: 37.3 — ABNORMAL LOW
MCV: 85.6
RBC: 4.36
WBC: 8.9

## 2011-02-17 LAB — BASIC METABOLIC PANEL
BUN: 76 — ABNORMAL HIGH
CO2: 22
CO2: 24
CO2: 24
CO2: 28
Calcium: 8.8
Calcium: 9.2
Chloride: 94 — ABNORMAL LOW
Chloride: 98
Creatinine, Ser: 10.76 — ABNORMAL HIGH
GFR calc Af Amer: 6 — ABNORMAL LOW
GFR calc Af Amer: 6 — ABNORMAL LOW
GFR calc non Af Amer: 5 — ABNORMAL LOW
Glucose, Bld: 182 — ABNORMAL HIGH
Glucose, Bld: 208 — ABNORMAL HIGH
Glucose, Bld: 210 — ABNORMAL HIGH
Glucose, Bld: 244 — ABNORMAL HIGH
Potassium: 4.2
Potassium: 4.4
Potassium: 4.8
Sodium: 131 — ABNORMAL LOW
Sodium: 135

## 2011-02-17 LAB — CULTURE, BLOOD (ROUTINE X 2)

## 2011-02-17 LAB — DIFFERENTIAL
Basophils Absolute: 0
Basophils Relative: 0
Eosinophils Relative: 0
Monocytes Absolute: 0.8 — ABNORMAL HIGH
Monocytes Relative: 5
Neutro Abs: 14.4 — ABNORMAL HIGH

## 2011-02-17 LAB — POCT I-STAT 4, (NA,K, GLUC, HGB,HCT)
Glucose, Bld: 244 — ABNORMAL HIGH
Operator id: 181601

## 2011-02-17 LAB — CBC
HCT: 41.4
Hemoglobin: 13.3
MCHC: 32
MCV: 84.2
RBC: 4.92
RDW: 17.9 — ABNORMAL HIGH

## 2011-02-17 LAB — VANCOMYCIN, TROUGH: Vancomycin Tr: 13.9

## 2011-02-17 LAB — CATH TIP CULTURE

## 2011-02-19 ENCOUNTER — Ambulatory Visit: Payer: Medicare Other | Admitting: Vascular Surgery

## 2011-02-19 ENCOUNTER — Other Ambulatory Visit: Payer: Medicare Other

## 2011-02-19 LAB — POCT I-STAT 4, (NA,K, GLUC, HGB,HCT)
Glucose, Bld: 171 — ABNORMAL HIGH
HCT: 49

## 2011-02-19 LAB — COMPREHENSIVE METABOLIC PANEL
ALT: 19
AST: 17
Albumin: 2.3 — ABNORMAL LOW
CO2: 30
Calcium: 8.4
Chloride: 100
GFR calc Af Amer: 16 — ABNORMAL LOW
GFR calc non Af Amer: 13 — ABNORMAL LOW
Sodium: 133 — ABNORMAL LOW

## 2011-02-19 LAB — DIFFERENTIAL
Eosinophils Absolute: 0.1
Eosinophils Relative: 2
Lymphs Abs: 1
Monocytes Absolute: 0.6

## 2011-02-19 LAB — CBC
RBC: 4.34
WBC: 7.1

## 2011-02-23 LAB — DIFFERENTIAL
Basophils Absolute: 0
Lymphocytes Relative: 10 — ABNORMAL LOW
Lymphs Abs: 0.7
Neutrophils Relative %: 80 — ABNORMAL HIGH

## 2011-02-23 LAB — COMPREHENSIVE METABOLIC PANEL
BUN: 68 — ABNORMAL HIGH
CO2: 20
Calcium: 8.2 — ABNORMAL LOW
Chloride: 109
Creatinine, Ser: 7.59 — ABNORMAL HIGH
GFR calc non Af Amer: 7 — ABNORMAL LOW
Glucose, Bld: 195 — ABNORMAL HIGH
Total Bilirubin: 0.3

## 2011-02-23 LAB — POCT I-STAT 4, (NA,K, GLUC, HGB,HCT)
HCT: 31 — ABNORMAL LOW
Hemoglobin: 10.5 — ABNORMAL LOW
Operator id: 181601

## 2011-02-23 LAB — URINALYSIS, ROUTINE W REFLEX MICROSCOPIC
Glucose, UA: 250 — AB
Ketones, ur: NEGATIVE
Leukocytes, UA: NEGATIVE
Nitrite: NEGATIVE
Protein, ur: >300 — AB
pH: 6

## 2011-02-23 LAB — CBC
HCT: 27.3 — ABNORMAL LOW
Hemoglobin: 8.9 — ABNORMAL LOW
MCHC: 32.8
MCV: 84.8
RBC: 3.23 — ABNORMAL LOW
WBC: 6.8

## 2011-02-23 LAB — CULTURE, BLOOD (ROUTINE X 2): Culture: NO GROWTH

## 2011-02-23 LAB — URINE MICROSCOPIC-ADD ON

## 2011-02-24 LAB — CROSSMATCH: Antibody Screen: NEGATIVE

## 2011-02-24 LAB — RENAL FUNCTION PANEL
Albumin: 2.2 — ABNORMAL LOW
BUN: 67 — ABNORMAL HIGH
Chloride: 111
Creatinine, Ser: 6.59 — ABNORMAL HIGH
Glucose, Bld: 104 — ABNORMAL HIGH
Potassium: 4.4

## 2011-02-24 LAB — HEPATITIS B SURFACE ANTIGEN: Hepatitis B Surface Ag: NEGATIVE

## 2011-02-24 LAB — HEMOGLOBIN AND HEMATOCRIT, BLOOD
HCT: 23.8 — ABNORMAL LOW
Hemoglobin: 7.8 — CL

## 2011-02-24 LAB — ABO/RH: ABO/RH(D): O POS

## 2011-03-11 ENCOUNTER — Encounter: Payer: Self-pay | Admitting: Vascular Surgery

## 2011-03-12 ENCOUNTER — Ambulatory Visit: Payer: Medicare Other | Admitting: Vascular Surgery

## 2011-03-12 ENCOUNTER — Other Ambulatory Visit (INDEPENDENT_AMBULATORY_CARE_PROVIDER_SITE_OTHER): Payer: Medicare Other | Admitting: *Deleted

## 2011-03-12 ENCOUNTER — Ambulatory Visit (INDEPENDENT_AMBULATORY_CARE_PROVIDER_SITE_OTHER): Payer: Medicare Other | Admitting: Vascular Surgery

## 2011-03-12 ENCOUNTER — Encounter: Payer: Self-pay | Admitting: Vascular Surgery

## 2011-03-12 ENCOUNTER — Other Ambulatory Visit: Payer: Medicare Other

## 2011-03-12 VITALS — BP 156/87 | HR 81 | Temp 98.1°F | Ht 70.0 in | Wt 220.0 lb

## 2011-03-12 DIAGNOSIS — N186 End stage renal disease: Secondary | ICD-10-CM

## 2011-03-12 DIAGNOSIS — T82898A Other specified complication of vascular prosthetic devices, implants and grafts, initial encounter: Secondary | ICD-10-CM

## 2011-03-12 NOTE — Progress Notes (Signed)
[facility] [place]  Established Dialysis Access  History of Present Illness  Julian [name] is a 65 y.o. male who presents for re-evaluation of his left 1st BVT.  The pt has had his RIJ [facility] exchanged since his last visit.  He continues to complain of some pain at the [facility] site.  Past Medical History, Past Surgical History, Social History, Family History, Medications, Allergies, and Review of Systems are unchanged from previous visit 01/29/11.  Physical Examination  Filed Vitals:   03/12/11 1129  BP: 156/87  Pulse: 81  Temp: 98.1 F (36.7 C)  TempSrc: Oral  Height: 5\' 10"  (1.778 m)  Weight: 220 lb (99.791 kg)  SpO2: 97%   LUE: Incision is healed, skin feels warm, hand grip is 5/5, sensation in digits is intact, palpable faint thrill, bruit can be auscultated  Non-Invasive Vascular Imaging  LUE access duplex   (Date: 03/12/11):   Distal basilic vein stenosis (PSV > 454 c/s) near valve  Arterial anastomosis: 554 c/s  With valve cusp/stenosis  Medical Decision Making  Julian [name] is a 65 y.o. male who presents with s/p L 1st stage BVT and ESRD requiring hemodialysis.   Based on vein mapping and examination, this patient's permanent access options include: L 2nd stage BVT vs L arm AVG  His basilic vein may not be adequate for use as a dialysis conduit so we may need to abandon the 1st stage BVT.  His right arm veins have already been used for access, so an AVG is his next option if a 2nd stage BVT cannot be completed.  I had an extensive discussion with this patient in regards to the nature of access surgery, including risk, benefits, and alternatives.    The patient is aware that the risks of access surgery include but are not limited to: bleeding, infection, steal syndrome, nerve damage, ischemic monomelic neuropathy, failure of access to mature, and possible need for additional access procedures in the future.  The patient has agreed to proceed  with the above procedure which will be scheduled 13 NOV 12.  [name], MD [facility] [place] Office: [phone] Pager: [phone]

## 2011-03-18 ENCOUNTER — Other Ambulatory Visit: Payer: Self-pay | Admitting: *Deleted

## 2011-03-18 MED ORDER — DEXTROSE 5 % IV SOLN: 1.5000 g | Freq: Once | INTRAVENOUS | Status: DC

## 2011-03-22 ENCOUNTER — Telehealth: Payer: Self-pay | Admitting: *Deleted

## 2011-03-22 NOTE — Procedures (Unsigned)
VASCULAR LAB EXAM  INDICATION:  Followup left basilic vein transposition  HISTORY: Left brachiobasilic fistula for dialysis access Diabetes: Cardiac: Hypertension:  EXAM:  Left brachiobasilic fistula is patent with elevated velocities at the anastomosis and an area of anatomic narrowing and elevated velocities in a segment 3 cm past the anastomosis.  Just beyond this is a vein valve.  The remainder of the fistula is unremarkable.  IMPRESSION: 1. Patent left brachiobasilic fistula with elevated velocities at the     anastomosis and an anatomically narrowed segment as described     above. 2. Please see attached worksheet for details.  ___________________________________________ Fransisco Hertz, MD  LT/MEDQ  D:  03/12/2011  T:  03/12/2011  Job:  [id number]

## 2011-03-22 NOTE — Telephone Encounter (Signed)
I had been trying to reach Julian [name] for days to reschedule his surgery from 03/23/11 to another day. When I talked with him on Friday he said he had to talk to someone at the dialysis center in [place] and would call me Monday. I called him since I did not here from him. He said that Tonya at the kidney center had scheduled him an appointment in [place] and so he needed to cancel this. I called Archie Patten [phone] at [facility] & she said Julian [name] had asked her to schedule him an appointment for a second opinion in [place]. She has him scheduled in December. I comfirmed this with another phone call to Julian [name] and told him I would put a note in the chart reflecting his desire to go to [facility] for another opinion.

## 2011-03-24 ENCOUNTER — Ambulatory Visit (HOSPITAL_COMMUNITY): Admission: RE | Admit: 2011-03-24 | Payer: Medicare Other | Source: Ambulatory Visit | Admitting: Vascular Surgery

## 2011-03-24 ENCOUNTER — Encounter (HOSPITAL_COMMUNITY): Admission: RE | Payer: Self-pay | Source: Ambulatory Visit

## 2011-03-24 SURGERY — ARTERIOVENOUS (AV) FISTULA CREATION
Anesthesia: Monitor Anesthesia Care | Site: Arm Upper | Laterality: Left

## 2011-04-06 ENCOUNTER — Ambulatory Visit (HOSPITAL_COMMUNITY)
Admission: RE | Admit: 2011-04-06 | Discharge: 2011-04-06 | Disposition: A | Discharge status: Home / Self Care | Payer: Medicare Other | Source: Ambulatory Visit | Attending: Internal Medicine | Admitting: Internal Medicine

## 2011-04-06 DIAGNOSIS — M6281 Muscle weakness (generalized): Secondary | ICD-10-CM | POA: Insufficient documentation

## 2011-04-06 DIAGNOSIS — S78119A Complete traumatic amputation at level between unspecified hip and knee, initial encounter: Secondary | ICD-10-CM | POA: Insufficient documentation

## 2011-04-06 DIAGNOSIS — R269 Unspecified abnormalities of gait and mobility: Secondary | ICD-10-CM | POA: Insufficient documentation

## 2011-04-06 DIAGNOSIS — R262 Difficulty in walking, not elsewhere classified: Secondary | ICD-10-CM | POA: Insufficient documentation

## 2011-04-06 DIAGNOSIS — IMO0001 Reserved for inherently not codable concepts without codable children: Secondary | ICD-10-CM | POA: Insufficient documentation

## 2011-04-06 NOTE — Patient Instructions (Addendum)
HEP

## 2011-04-06 NOTE — Progress Notes (Signed)
Physical Therapy Evaluation  Patient Details  Name: Julian [name] MRN: [id number] Date of Birth: 12/31/1945  Today's Date: 04/06/2011 Time: 4098-1191 Time Calculation (min): 48 min Visit#: 1  of 8   Re-eval: 05/06/11 Assessment Diagnosis: difficulty walking Surgical Date: 10/08/05 Next MD Visit: 05/12/2010 Prior Therapy: 2 yrs. ago.  Past Medical History:  Past Medical History  Diagnosis Date  . Chronic kidney disease   . Hypertension   . Diabetes mellitus   . Peripheral vascular disease   . Obesity    Past Surgical History:  Past Surgical History  Procedure Date  . Above knee leg amputation     Right AKA  . Eye surgery   . Av fistula placement   . Arteriovenous graft placement   . Pr vein bypass graft,aorto-fem-pop     Subjective Symptoms/Limitations Symptoms: Julian [name] states that he had his amputation about five years ago.  The patient states that he got a prothesis about one year ago.  He states that his old prothesis had a hook that he had to pull on and he never was able to get the leg on by himself.    He finally got the prosthetists  to change his leg approximately one month ago to a system that automatically locked.  The patient is now being referred for gait training to become I in ambulation.   How long can you sit comfortably?: no problem How long can you stand comfortably?: 3 seconds without holding onto the walker.  Up to ten minutes holding onto the walker. How long can you walk comfortably?: The patient states that he has not walked with his new prothesis yet. Pain Assessment Currently in Pain?: No/denies  Precautions/Restrictions  Precautions Precautions: Fall  Prior Functioning  Home Living Lives With: Alone Receives Help From: Personal care attendant Type of Home: Apartment Home Layout: One level Home Access: Level entry Prior Function Level of Independence: Independent with basic ADLs Driving: No  Objective: RLE Strength Right Hip  Flexion: 4/5 Right Hip Extension: 2/5 Right Hip ABduction: 3/5 Right Hip ADduction: 3-/5 LLE Strength Left Hip Flexion: 3+/5 Left Hip Extension: 2/5 Left Hip ABduction: 4/5 Left Hip ADduction: 3-/5 Left Knee Flexion: 5/5 Left Knee Extension: 5/5 Left Ankle Dorsiflexion: 4/5 Left Ankle Plantar Flexion: 3+/5 Left Ankle Inversion: 4/5 Left Ankle Eversion: 4/5  Pt is only able to stand without UE support for 3 seconds.  Pt is able to ambulate with rolling walker and supervision for 3ft.   Exercise/Treatments   Supine Bridges: 10 reps Straight Leg Raises: 10 reps Side lying Hip ABduction: 10 reps Hip ADduction: 10 reps    Physical Therapy Assessment and Plan PT Assessment and Plan Clinical Impression Statement: Pt with LE weakness, balance difficulty who will benefit from skilled PT to by I with ambulation with new prosthetic. Rehab Potential: Good Clinical Impairments Affecting Rehab Potential: decreased balance, decreased strength. PT Frequency: Min 2X/week PT Duration: 4 weeks PT Treatment/Interventions: DME instruction;Gait training;Therapeutic exercise;Balance training PT Plan: Pt is on dialysis.  See pt two times a week to improve balance and I in ambulation.  Begin gt training concentrating on step through pattern, Trunk rotation with cones with one hand support, standing with no hand support.  Sit to stand as an exercise next visitl    Goals Home Exercise Program Pt will Perform Home Exercise Program: Independently PT Short Term Goals Time to Complete Short Term Goals: 2 weeks PT Short Term Goal 1: Pt to be able to stand without  holding to walker for 10 seconds. PT Short Term Goal 2: Pt to be able to ambulate with walker for 100 ft without stopping PT Short Term Goal 3: Pt. to begin a step through gt pattern. PT Long Term Goals Time to Complete Long Term Goals: 4 weeks PT Long Term Goal 1: mm strength increased by one grade. PT Long Term Goal 2: Pt to be able to  stand without using his hands for 2 min and be able to use hands for functional activity.  ie reach to get a cup out of the cupboard. Long Term Goal 3: Pt to be able to come to sit to stand with ease. Long Term Goal 4: Pt able to come stand to sit with controlled descent PT Long Term Goal 5: Pt able to demonstrate a smooth step through gait pattern. Additional PT Long Term Goals?: Yes PT Long Term Goal 6: Pt to be able to ambulate for 15 to 20 minutes without stopping.  Problem List Patient Active Problem List  Diagnoses  . End-stage renal disease on hemodialysis  . End stage renal disease  . Difficulty in walking  . Abnormality of gait  . Bilateral leg weakness    PT - End of Session Equipment Utilized During Treatment: Gait belt Activity Tolerance: Patient tolerated treatment well General Behavior During Session: Julian [name] for tasks performed   Julian [name],CINDY 04/06/2011, 10:54 AM  Physician Documentation Your signature is required to indicate approval of the treatment plan as stated above.  Please sign and either send electronically or make a copy of this report for your files and return this physician signed original.   Please mark one 1.__approve of plan  2. ___approve of plan with the following conditions.   ______________________________                                                          _____________________ Physician Signature                                                                                                             Date

## 2011-04-08 ENCOUNTER — Ambulatory Visit (HOSPITAL_COMMUNITY): Payer: Medicare Other | Admitting: *Deleted

## 2011-04-13 ENCOUNTER — Ambulatory Visit (HOSPITAL_COMMUNITY)
Admission: RE | Admit: 2011-04-13 | Discharge: 2011-04-13 | Disposition: A | Discharge status: Home / Self Care | Payer: Medicare Other | Source: Ambulatory Visit | Attending: Internal Medicine | Admitting: Internal Medicine

## 2011-04-13 DIAGNOSIS — IMO0001 Reserved for inherently not codable concepts without codable children: Secondary | ICD-10-CM | POA: Insufficient documentation

## 2011-04-13 DIAGNOSIS — S78119A Complete traumatic amputation at level between unspecified hip and knee, initial encounter: Secondary | ICD-10-CM | POA: Insufficient documentation

## 2011-04-13 DIAGNOSIS — R269 Unspecified abnormalities of gait and mobility: Secondary | ICD-10-CM | POA: Insufficient documentation

## 2011-04-13 DIAGNOSIS — M6281 Muscle weakness (generalized): Secondary | ICD-10-CM | POA: Insufficient documentation

## 2011-04-13 NOTE — Progress Notes (Signed)
Physical Therapy Treatment Patient Details  Name: Julian [name] MRN: [id number] Date of Birth: 1946-02-02  Today's Date: 04/13/2011 Time: 7253-6644 Time Calculation (min): 56 min Visit#: 2  of 8   Re-eval: 05/06/11 Charges:  NMR 14', gait 25'    Subjective: Symptoms/Limitations Symptoms: Pt. reports no pain; states he's been working on his exercise and mobility a little at home. Pain Assessment Currently in Pain?: No/denies   Exercise/Treatments Standing Gait Training: With RW and prosthesis locked/unlocked 80 feet X 2  Other Standing Knee Exercises: rotation with cones 1RT with R UE, L UE for support Other Standing Knee Exercises: Balance without UE's 20"X3 Gait in parallel bars 3RT   Physical Therapy Assessment and Plan PT Assessment and Plan Clinical Impression Statement: VC's for posture and R LE advancement/stance.  Pt. with overall improved gait quality today.  Requires 1 HHA to stand without AD.  Unable to complete mat activities today due to focus on gait/balance. PT Plan: To progress balance and overall confidence.  Add prone hip extension next visit.     Problem List Patient Active Problem List  Diagnoses  . End-stage renal disease on hemodialysis  . End stage renal disease  . Difficulty in walking  . Abnormality of gait  . Bilateral leg weakness    PT - End of Session Equipment Utilized During Treatment: Gait belt;Right lower extremity prosthesis Activity Tolerance: Patient tolerated treatment well General Behavior During Session: [facility] for tasks performed Cognition: [facility] for tasks performed  Emeline Gins B 04/13/2011, 12:23 PM

## 2011-04-15 ENCOUNTER — Ambulatory Visit (HOSPITAL_COMMUNITY)
Admission: RE | Admit: 2011-04-15 | Discharge: 2011-04-15 | Disposition: A | Discharge status: Home / Self Care | Payer: Medicare Other | Source: Ambulatory Visit | Attending: Internal Medicine | Admitting: Internal Medicine

## 2011-04-15 NOTE — Progress Notes (Signed)
Physical Therapy Treatment Patient Details  Name: Julian [name] MRN: [id number] Date of Birth: November 17, 1945  Today's Date: 04/15/2011 Time: 4098-1191 Time Calculation (min): 44 min Visit#: 3  of 8   Re-eval: 05/06/11 Charges: Gait x 20' NMR x 10' Therex x 10'  Subjective: Symptoms/Limitations Symptoms: Pt reports he is pain free. He reprots that he walked with his aid at home last night for two hours on and off. Pain Assessment Currently in Pain?: No/denies   Exercise/Treatments Standing Gait Training: With RW and prosthesis locked/unlocked 160 feet X 2  Other Standing Knee Exercises: Balance without UE assist Prone  Hip Extension: 10 reps;Right;Left;AAROM  Physical Therapy Assessment and Plan PT Assessment and Plan Clinical Impression Statement: Pt requires vc's to increase stride length on L and to facilitate roll through on R foot. Pt unable to complete rotation with cones secondary to decreased balance. Began practicing standing balance in parallel bars with hand held assist as needed. Pt required multimodal cueing to stand tall and to maintain balance and avoid leaning. PT Plan: Continue to progress per PT POC. Continue to stretngthen hips.     Problem List Patient Active Problem List  Diagnoses  . End-stage renal disease on hemodialysis  . End stage renal disease  . Difficulty in walking  . Abnormality of gait  . Bilateral leg weakness    PT - End of Session Activity Tolerance: Patient tolerated treatment well General Behavior During Session: [facility] for tasks performed Cognition: [facility] for tasks performed  Antonieta Iba 04/15/2011, 10:30 AM

## 2011-04-20 ENCOUNTER — Ambulatory Visit (HOSPITAL_COMMUNITY)
Admission: RE | Admit: 2011-04-20 | Discharge: 2011-04-20 | Disposition: A | Discharge status: Home / Self Care | Payer: Medicare Other | Source: Ambulatory Visit | Attending: Internal Medicine | Admitting: Internal Medicine

## 2011-04-20 NOTE — Progress Notes (Signed)
Physical Therapy Treatment Patient Details  Name: Julian [name] MRN: [id number] Date of Birth: Oct 13, 1945  Today's Date: 04/20/2011 Time: 1478-2956 Time Calculation (min): 58 min Visit#: 4  of 8   Re-eval: 05/06/11 Charges:  therex 15',  gait 15', NMR 18'     Subjective: Symptoms/Limitations Symptoms: Pt. states he's been up with diarrhea most of the night.  States he thinks it may be something he ate, but not sure.  Pt. reports no pain today.  pt. questioning his phantom limb pain stating he has itching in his leg. Pain Assessment Currently in Pain?: No/denies  Precautions/Restrictions : Right AKA with fall precautions   Exercise/Treatments Standing Gait Training: With RW and prosthesis unlocked 80 feet X 2  Other Standing Knee Exercises: parallel bars 3RT working on L LE advancement past R Other Standing Knee Exercises: Balance without UE assist and cone rotation 2RT with 1 UE in parallell bars Supine Bridges: 2 sets;10 reps Sidelying Hip ABduction: 15 reps;Both Prone  Hip Extension: 15 reps;Both (requires AA secondary to weakness)   Physical Therapy Assessment and Plan PT Assessment and Plan Clinical Impression Statement: Pt. with significant hip extensor weakness; focused on stengthening; Had to provide assistance to perform prone hip extension and unable to clear the mat with bridges. PT Plan: Continue to progress balance and strength.    Problem List Patient Active Problem List  Diagnoses  . End-stage renal disease on hemodialysis  . End stage renal disease  . Difficulty in walking  . Abnormality of gait  . Bilateral leg weakness    PT - End of Session Equipment Utilized During Treatment: Gait belt;Right lower extremity prosthesis Activity Tolerance: Patient tolerated treatment well General Behavior During Session: [facility] for tasks performed Cognition: [facility] for tasks performed  Emeline Gins B 04/20/2011, 9:54 AM

## 2011-04-22 ENCOUNTER — Ambulatory Visit (HOSPITAL_COMMUNITY)
Admission: RE | Admit: 2011-04-22 | Discharge: 2011-04-22 | Disposition: A | Discharge status: Home / Self Care | Payer: Medicare Other | Source: Ambulatory Visit | Attending: Internal Medicine | Admitting: Internal Medicine

## 2011-04-22 DIAGNOSIS — R29898 Other symptoms and signs involving the musculoskeletal system: Secondary | ICD-10-CM

## 2011-04-22 DIAGNOSIS — R269 Unspecified abnormalities of gait and mobility: Secondary | ICD-10-CM

## 2011-04-22 DIAGNOSIS — R262 Difficulty in walking, not elsewhere classified: Secondary | ICD-10-CM

## 2011-04-22 NOTE — Progress Notes (Signed)
Physical Therapy Treatment Patient Details  Name: Julian [name] MRN: [id number] Date of Birth: Dec 13, 1945  Today's Date: 04/22/2011 Time: 0900-0952 Time Calculation (min): 52 min Visit#: 5  of 8   Re-eval: 05/06/11  Charge: gait 38 min therex 16'  Subjective: Symptoms/Limitations Symptoms: Pt reports no pain, states he has been walking around the house.  "Julian [name] is coming up this weekend from [place], we're going to work on walking." Pain Assessment Currently in Pain?: No/denies  Objective:   Mobility (including Balance) Transfers Sit to Stand: 5: Supervision Stand to Sit: 5: Supervision Stand to Sit Details: with no arm rests on chair, 1 UE assistance to stand. Ambulation/Gait Ambulation/Gait: Yes Ambulation/Gait Assistance: 5: Supervision Ambulation/Gait Assistance Details (indicate cue type and reason): VC-ing equal stride length, decrease step length with R LE, increase with L LE Ambulation Distance (Feet): 248 Feet Assistive device: Rolling walker Gait Pattern: Decreased step length - left;Trunk flexed;Decreased stance time - right Gait velocity: slow Stairs: No     Exercise/Treatments Standing Gait Training: With RW and prosthesis unlocked 248 ft with 1 rest break Other Standing Knee Exercises: parallel bars 3RT working on L LE advancement past R; retro gait 3 RT in parallel bars Other Standing Knee Exercises: Balance without UE assist max of 20" and cone rotation 2RT with 1 UE in parallell bars Supine Bridges: 20 reps Straight Leg Raises: 10 reps Sidelying Hip ABduction: 15 reps;Both Prone  Hip Extension: 20 reps  Physical Therapy Assessment and Plan PT Assessment and Plan Clinical Impression Statement: Session focused on overall LE strengthening and proper mechanics with gait.  Weakness continues with hip extension and bridges, required AA with hip extension and unable to clear the mat with bridges.  Added supine SLR with vc to control eccentric  control while descending R LE.  VC-ing required to decrease step length with R LE and increase L LE step length for more normalized gait.  Increased distance with improved endurance today.  Added retro gait in parallel bars to improve overall mobility with walking backwards to sit in chair.  Began sit to stand  with 1 UE A required to stand. PT Plan: Continue to progress balance and strength.     Problem List Patient Active Problem List  Diagnoses  . End-stage renal disease on hemodialysis  . End stage renal disease  . Difficulty in walking  . Abnormality of gait  . Bilateral leg weakness    PT - End of Session Activity Tolerance: Patient tolerated treatment well General Behavior During Session: [facility] for tasks performed Cognition: [facility] for tasks performed  Julian [name] 04/22/2011, 9:55 AM

## 2011-04-27 ENCOUNTER — Ambulatory Visit (HOSPITAL_COMMUNITY): Payer: Medicare Other | Admitting: Physical Therapy

## 2011-04-28 ENCOUNTER — Ambulatory Visit: Payer: Self-pay | Admitting: Vascular Surgery

## 2011-04-29 ENCOUNTER — Ambulatory Visit (HOSPITAL_COMMUNITY): Payer: Medicare Other | Admitting: Physical Therapy

## 2011-05-06 ENCOUNTER — Ambulatory Visit (HOSPITAL_COMMUNITY)
Admission: RE | Admit: 2011-05-06 | Discharge: 2011-05-06 | Disposition: A | Discharge status: Home / Self Care | Payer: Medicare Other | Source: Ambulatory Visit | Attending: Internal Medicine | Admitting: Internal Medicine

## 2011-05-06 DIAGNOSIS — R269 Unspecified abnormalities of gait and mobility: Secondary | ICD-10-CM

## 2011-05-06 DIAGNOSIS — R262 Difficulty in walking, not elsewhere classified: Secondary | ICD-10-CM

## 2011-05-06 DIAGNOSIS — R29898 Other symptoms and signs involving the musculoskeletal system: Secondary | ICD-10-CM

## 2011-05-06 NOTE — Progress Notes (Signed)
Physical Therapy Re-Evaluation  Patient Details  Name: Julian [name] MRN: [id number] Date of Birth: 02-12-1946  Today's Date: 05/06/2011 Time: 1003-1101 Time Calculation (min): 58 min Charges: 1 re-eval, 20 gait, 10 there ex, 13 there act Visit#: 6  of 16   Re-eval: 06/05/11  Past Medical History:  Past Medical History  Diagnosis Date  . Chronic kidney disease   . Hypertension   . Diabetes mellitus   . Peripheral vascular disease   . Obesity    Past Surgical History:  Past Surgical History  Procedure Date  . Above knee leg amputation     Right AKA  . Eye surgery   . Av fistula placement   . Arteriovenous graft placement   . Pr vein bypass graft,aorto-fem-pop    Subjective Symptoms/Limitations Symptoms: No reports of pain, has been walking with his brother at home.  Scared to practice by himself, fearful of falling.  No falls since he has gotten his prosthesis.  How long can you sit comfortably?: no problems sitting How long can you stand comfortably?: less than 30 seconds without holding the RW-posterior LOB, greater than 10 mins while holding the RW.   How long can you walk comfortably?: ~10 mins, seated rest break ~10 mins again- had an abnormal step-to gait pattern with prolonged heel strike, decreased right hip extnsion and flexed posture, though.   Pain Assessment Currently in Pain?: No/denies  Assessment ( )= last filed values RLE Strength Right Hip Flexion: 5/5 (4/5) Right Hip Extension: 4/5 (2/5) Right Hip ABduction: 3/5 (3/5) Right Hip ADduction: 5/5 (3-/5) LLE Strength Left Hip Flexion: 5/5 (3+/5) Left Hip Extension: 3/5 (2/5) Left Hip ABduction: 3/5 (4/5) Left Hip ADduction: 3-/5 (3-/5) Left Knee Flexion: 4/5 (5 /5 Left Knee Extension: 4/5 (5 /5) Left Ankle Dorsiflexion: 3+/5 (4 /5) Left Ankle Plantar Flexion: 4/5 (3+/5)  Exercise/Treatments Gait Training: stepping forward (ahead of right leg and backward (behind right leg) holding to RW to  start to work on step through gait pattern with increased right leg extension.  2 sets 15 reps   Physical Therapy Assessment and Plan  Clinical Impression Statement: Re-evaluation performed with increase in strength in some areas, but not all areas.  Continues to have difficulty standing wtihout upper extremity support (posterior LOB) and good posture.  Increaseing endurance and safety with gait, but need to work on more normal gait pattern with upright posture.  He has met 3/4 STGs and 1/6 LTGs.   PT Frequency: Min 2X/week PT Duration: 4 weeks PT Treatment/Interventions: DME instruction;Gait training;Functional mobility training;Therapeutic activities;Therapeutic exercise;Balance training;Neuromuscular re-education;Patient/family education PT Plan: Continue 2xs/w x 4 more weeks working on normalizing step through gait, balance with hands free standing with upright posture, and general leg strengthening.      Goals Home Exercise Program Pt will Perform Home Exercise Program: Independently PT Goal: Perform Home Exercise Program - Progress: Met PT Short Term Goals Time to Complete Short Term Goals: 2 weeks PT Short Term Goal 1: Pt to be able to stand without holding to walker for 10 seconds PT Short Term Goal 1 - Progress: Met PT Short Term Goal 2: Pt to be able to ambulate with walker for 100 ft without stopping  PT Short Term Goal 2 - Progress: Met PT Short Term Goal 3: Pt. to begin a step through gt pattern PT Short Term Goal 3 - Progress: Partly met PT Long Term Goals Time to Complete Long Term Goals: 4 weeks PT Long Term Goal 1: mm  strength increased by one grade. PT Long Term Goal 1 - Progress: Partly met PT Long Term Goal 2: Pt to be able to stand without using his hands for 2 min and be able to use hands for functional activity. ie reach to get a cup out of the cupboard PT Long Term Goal 2 - Progress: Not met Long Term Goal 3: Pt to be able to come to sit to stand with ease Long  Term Goal 3 Progress: Partly met Long Term Goal 4: Pt able to come stand to sit with controlled descent  Long Term Goal 4 Progress: Met PT Long Term Goal 5: Pt able to demonstrate a smooth step through gait pattern.  Long Term Goal 5 Progress: Not met Additional PT Long Term Goals?: Yes PT Long Term Goal 6: Pt to be able to ambulate for 15 to 20 minutes without stopping Long Term Goal 6 Progress: Progressing toward goal  Problem List Patient Active Problem List  Diagnoses  . End-stage renal disease on hemodialysis  . End stage renal disease  . Difficulty in walking  . Abnormality of gait  . Bilateral leg weakness   PT - End of Session Equipment Utilized During Treatment: Gait belt;Other (comment) (RW) Activity Tolerance: Patient tolerated treatment well;Patient limited by fatigue General Behavior During Session: [facility] for tasks performed Cognition: [facility] for tasks performed  Tyvon Eggenberger B. Brianda Beitler, PT, DPT  05/06/2011, 12:20 PM  Physician Documentation Your signature is required to indicate approval of the treatment plan as stated above.  Please sign and either send electronically or make a copy of this report for your files and return this physician signed original.   Please mark one 1.__approve of plan  2. ___approve of plan with the following conditions.   ______________________________                                                          _____________________ Physician Signature                                                                                                             Date

## 2011-05-08 ENCOUNTER — Ambulatory Visit (HOSPITAL_COMMUNITY)
Admission: RE | Admit: 2011-05-08 | Discharge: 2011-05-08 | Disposition: A | Discharge status: Home / Self Care | Payer: Medicare Other | Source: Ambulatory Visit | Attending: Internal Medicine | Admitting: Internal Medicine

## 2011-05-08 ENCOUNTER — Other Ambulatory Visit (HOSPITAL_COMMUNITY): Payer: Self-pay | Admitting: Internal Medicine

## 2011-05-08 DIAGNOSIS — R05 Cough: Secondary | ICD-10-CM | POA: Insufficient documentation

## 2011-05-08 DIAGNOSIS — J40 Bronchitis, not specified as acute or chronic: Secondary | ICD-10-CM | POA: Insufficient documentation

## 2011-05-08 DIAGNOSIS — R059 Cough, unspecified: Secondary | ICD-10-CM | POA: Insufficient documentation

## 2011-05-13 ENCOUNTER — Ambulatory Visit (HOSPITAL_COMMUNITY)
Admission: RE | Admit: 2011-05-13 | Discharge: 2011-05-13 | Disposition: A | Discharge status: Home / Self Care | Payer: Medicare Other | Source: Ambulatory Visit | Attending: Internal Medicine | Admitting: Internal Medicine

## 2011-05-13 DIAGNOSIS — R29898 Other symptoms and signs involving the musculoskeletal system: Secondary | ICD-10-CM

## 2011-05-13 DIAGNOSIS — S78119A Complete traumatic amputation at level between unspecified hip and knee, initial encounter: Secondary | ICD-10-CM | POA: Insufficient documentation

## 2011-05-13 DIAGNOSIS — IMO0001 Reserved for inherently not codable concepts without codable children: Secondary | ICD-10-CM | POA: Insufficient documentation

## 2011-05-13 DIAGNOSIS — M6281 Muscle weakness (generalized): Secondary | ICD-10-CM | POA: Insufficient documentation

## 2011-05-13 DIAGNOSIS — R269 Unspecified abnormalities of gait and mobility: Secondary | ICD-10-CM | POA: Insufficient documentation

## 2011-05-13 DIAGNOSIS — R262 Difficulty in walking, not elsewhere classified: Secondary | ICD-10-CM

## 2011-05-13 NOTE — Progress Notes (Signed)
Physical Therapy Treatment Patient Details  Name: Julian [name] MRN: [id number] Date of Birth: April 01, 1946  Today's Date: 05/13/2011 Time: 5621-3086 Time Calculation (min): 48 min Visit#: 7  of 16   Re-eval: 06/05/11  Charge: gait 30 min NMR 18 min  Subjective: Symptoms/Limitations Symptoms: No pain today, havent't been walking much around the house, don't have anyone there to walk with me. Pain Assessment Currently in Pain?: No/denies  Objective:  Exercise/Treatments Mobility/Balance  Bed Mobility Bed Mobility: Yes Rolling Right: 5: Supervision Rolling Left: 5: Supervision Right Sidelying to Sit: 4: Min assist Sit to Supine - Right: 6: Modified independent (Device/Increase time) Transfers Sit to Stand: 5: Supervision Ambulation/Gait Ambulation/Gait: Yes Ambulation/Gait Assistance: 5: Supervision Ambulation/Gait Assistance Details (indicate cue type and reason): VC-ing to equal stride, decreased step length with L LE, decreased stance phase on R Le Ambulation Distance (Feet): 300 Feet Assistive device: Rolling walker Gait Pattern: Decreased step length - left;Decreased stance time - right;Trunk flexed Gait velocity: slow Stairs: No     Standing Other Standing Knee Exercises: Balance without UE assist max of 41" max  Prone  Hip Extension: 20 reps    Physical Therapy Assessment and Plan PT Assessment and Plan Clinical Impression Statement: Session concentrated on gait mechanics with max cueing for equal stride length with multimodal cueing required to increase L LE step length and increase R LE stance phase., visual cueing placing each foot in next tile most effective.  Pt improving wtih balance, able to stand for 41" max with no HHA but did required vc-ing for posture. PT Plan: Continue with current POC for more normalized gait, balance and leg strengthening. Begin retro gait next session.    Goals    Problem List Patient Active Problem List  Diagnoses  .  End-stage renal disease on hemodialysis  . End stage renal disease  . Difficulty in walking  . Abnormality of gait  . Bilateral leg weakness    PT - End of Session Activity Tolerance: Patient tolerated treatment well General Behavior During Session: [facility] for tasks performed Cognition: [facility] for tasks performed  Juel Burrow 05/13/2011, 10:02 AM

## 2011-05-18 ENCOUNTER — Ambulatory Visit (HOSPITAL_COMMUNITY)
Admission: RE | Admit: 2011-05-18 | Discharge: 2011-05-18 | Disposition: A | Discharge status: Home / Self Care | Payer: Medicare Other | Source: Ambulatory Visit | Attending: Internal Medicine | Admitting: Internal Medicine

## 2011-05-18 DIAGNOSIS — R269 Unspecified abnormalities of gait and mobility: Secondary | ICD-10-CM

## 2011-05-18 DIAGNOSIS — R29898 Other symptoms and signs involving the musculoskeletal system: Secondary | ICD-10-CM

## 2011-05-18 DIAGNOSIS — R262 Difficulty in walking, not elsewhere classified: Secondary | ICD-10-CM

## 2011-05-18 NOTE — Progress Notes (Signed)
Physical Therapy Treatment Patient Details  Name: Julian [name] MRN: [id number] Date of Birth: 1945-06-03  Today's Date: 05/18/2011 Time: 9563-8756 Time Calculation (min): 66 min Visit#: 8  of 18   Re-eval: 06/05/11  Charge: gait 38 min NMR 8 min  Subjective: Symptoms/Limitations Symptoms: No pain today, trouble getting prosthetic to click, pt removed and tried to fit back on.  PT recommended pt seeing prostetic specialist Pain Assessment Currently in Pain?: No/denies  Objective:   Exercise/Treatments Mobility/Balance  Bed Mobility Bed Mobility: Yes Rolling Right: 5: Supervision Right Sidelying to Sit: 5: Supervision Sitting - Scoot to Edge of Bed: 5: Supervision Transfers Transfers: Yes Sit to Stand: 5: Supervision Stand to Sit: 5: Supervision Stand to Sit Details: cueing to reach for chair Ambulation/Gait Ambulation/Gait: Yes Ambulation/Gait Assistance: 5: Supervision Ambulation/Gait Assistance Details (indicate cue type and reason): VC-ing to equal stride, decreased step length with L LE, decreased stance phase on R LE Ambulation Distance (Feet): 350 Feet Assistive device: Rolling walker Gait Pattern: Decreased step length - left;Decreased stance time - right Gait velocity: increased cadence through session  Static Standing Balance Static Standing - Balance Support: No upper extremity supported Static Standing - Level of Assistance: 5: Stand by assistance Static Standing - Comment/# of Minutes: 46", 86", 70" 3 attempts  Standing Gait Training: gait training in hallway x 350 ft Other Standing Knee Exercises: Balance without UEA 3 attempts for 48", 86", 70"  Physical Therapy Assessment and Plan PT Assessment and Plan Clinical Impression Statement: Pt had difficulty getting prostetic clicked.  PT found supplier and gave number address for pt to contact to check leg sizing.  Pt wtih improving confidence with gait, able to ambulate in hallway with 2 rest breaks in  350 ft.  Balance imporving as well able to stand 86" max with no HHA with less cueing for posture.   PT Plan: Continue with current POC for normalized gait, balance and leg strengthening.  Begin retro gait next session.    Goals Home Exercise Program Pt will Perform Home Exercise Program: Independently PT Short Term Goals Time to Complete Short Term Goals: 2 weeks PT Short Term Goal 1: Pt to be able to stand without holding to walker for 10 seconds PT Short Term Goal 2: Pt to be able to ambulate with walker for 100 ft without stopping  PT Short Term Goal 3: Pt. to begin a step through gt pattern PT Long Term Goals Time to Complete Long Term Goals: 4 weeks PT Long Term Goal 1: mm strength increased by one grade. PT Long Term Goal 2: Pt to be able to stand without using his hands for 2 min and be able to use hands for functional activity. ie reach to get a cup out of the cupboard Long Term Goal 3: Pt to be able to come to sit to stand with ease Long Term Goal 4: Pt able to come stand to sit with controlled descent  PT Long Term Goal 5: Pt able to demonstrate a smooth step through gait pattern.  Additional PT Long Term Goals?: Yes PT Long Term Goal 6: Pt to be able to ambulate for 15 to 20 minutes without stopping  Problem List Patient Active Problem List  Diagnoses  . End-stage renal disease on hemodialysis  . End stage renal disease  . Difficulty in walking  . Abnormality of gait  . Bilateral leg weakness    PT - End of Session Equipment Utilized During Treatment: Gait belt;Other (comment) (Gait belt  and RW with ambulation) Activity Tolerance: Patient tolerated treatment well General Behavior During Session: [facility] for tasks performed Cognition: [facility] for tasks performed  Julian [name] 05/18/2011, 10:08 AM

## 2011-05-20 ENCOUNTER — Ambulatory Visit (HOSPITAL_COMMUNITY): Payer: Medicare Other

## 2011-05-25 ENCOUNTER — Ambulatory Visit (HOSPITAL_COMMUNITY): Payer: Medicare Other | Admitting: Physical Therapy

## 2011-05-25 ENCOUNTER — Telehealth (HOSPITAL_COMMUNITY): Payer: Self-pay

## 2011-05-27 ENCOUNTER — Ambulatory Visit (HOSPITAL_COMMUNITY): Payer: Medicare Other

## 2011-06-01 ENCOUNTER — Ambulatory Visit (HOSPITAL_COMMUNITY): Payer: Medicare Other | Admitting: *Deleted

## 2011-06-01 ENCOUNTER — Telehealth (HOSPITAL_COMMUNITY): Payer: Self-pay

## 2011-06-03 ENCOUNTER — Ambulatory Visit (HOSPITAL_COMMUNITY): Payer: Medicare Other

## 2011-06-03 ENCOUNTER — Telehealth (HOSPITAL_COMMUNITY): Payer: Self-pay

## 2011-06-08 ENCOUNTER — Ambulatory Visit (HOSPITAL_COMMUNITY): Payer: Medicare Other | Admitting: Physical Therapy

## 2011-06-20 ENCOUNTER — Encounter (HOSPITAL_COMMUNITY): Payer: Self-pay

## 2011-06-20 ENCOUNTER — Emergency Department (HOSPITAL_COMMUNITY)
Admission: EM | Admit: 2011-06-20 | Discharge: 2011-06-20 | Disposition: A | Discharge status: Home / Self Care | Payer: Medicare Other | Attending: Emergency Medicine | Admitting: Emergency Medicine

## 2011-06-20 ENCOUNTER — Emergency Department (HOSPITAL_COMMUNITY): Payer: Medicare Other

## 2011-06-20 DIAGNOSIS — E119 Type 2 diabetes mellitus without complications: Secondary | ICD-10-CM | POA: Insufficient documentation

## 2011-06-20 DIAGNOSIS — I129 Hypertensive chronic kidney disease with stage 1 through stage 4 chronic kidney disease, or unspecified chronic kidney disease: Secondary | ICD-10-CM | POA: Insufficient documentation

## 2011-06-20 DIAGNOSIS — S78119A Complete traumatic amputation at level between unspecified hip and knee, initial encounter: Secondary | ICD-10-CM | POA: Insufficient documentation

## 2011-06-20 DIAGNOSIS — I739 Peripheral vascular disease, unspecified: Secondary | ICD-10-CM | POA: Insufficient documentation

## 2011-06-20 DIAGNOSIS — M545 Low back pain, unspecified: Secondary | ICD-10-CM | POA: Insufficient documentation

## 2011-06-20 DIAGNOSIS — N189 Chronic kidney disease, unspecified: Secondary | ICD-10-CM | POA: Insufficient documentation

## 2011-06-20 DIAGNOSIS — M549 Dorsalgia, unspecified: Secondary | ICD-10-CM

## 2011-06-20 DIAGNOSIS — Z794 Long term (current) use of insulin: Secondary | ICD-10-CM | POA: Insufficient documentation

## 2011-06-20 DIAGNOSIS — Z992 Dependence on renal dialysis: Secondary | ICD-10-CM | POA: Insufficient documentation

## 2011-06-20 LAB — POCT I-STAT, CHEM 8
Chloride: 113 mEq/L — ABNORMAL HIGH (ref 96–112)
Glucose, Bld: 97 mg/dL (ref 70–99)
HCT: 36 % — ABNORMAL LOW (ref 39.0–52.0)
Hemoglobin: 12.2 g/dL — ABNORMAL LOW (ref 13.0–17.0)
Potassium: 4.7 mEq/L (ref 3.5–5.1)
Sodium: 141 mEq/L (ref 135–145)

## 2011-06-20 MED ORDER — HYDROCODONE-ACETAMINOPHEN 5-325 MG PO TABS: ORAL_TABLET | ORAL | Status: AC

## 2011-06-20 MED ORDER — MORPHINE SULFATE 4 MG/ML IJ SOLN
4.0000 mg | Freq: Once | INTRAMUSCULAR | Status: AC
  Administered 2011-06-20: 4 mg via INTRAMUSCULAR
  Filled 2011-06-20: qty 1

## 2011-06-20 NOTE — ED Notes (Signed)
Pt c/o lower back pain x 2 weeks. Denies injury.  Pt has dialysis cath in right chest and dialysis graft in left arm.  Positive thrill and bruit.  Pt r aka.

## 2011-06-20 NOTE — ED Notes (Signed)
Pt states that he is only able to produce urine in mornings.
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
## 2011-06-20 NOTE — ED Notes (Signed)
Was never able to void for urine sample.  Does state he feels much better than he did when he arrived here.  Ready to go home and his home health aid is coming to transport him home.

## 2011-06-28 ENCOUNTER — Ambulatory Visit: Payer: Self-pay | Admitting: Vascular Surgery

## 2011-06-29 NOTE — ED Provider Notes (Signed)
Medical screening examination/treatment/procedure(s) were conducted as a shared visit with non-physician practitioner(s) and myself.  I personally evaluated the patient during the encounter   Bellany Elbaum E Dio Giller, MD 06/29/11 1539 

## 2011-07-30 ENCOUNTER — Other Ambulatory Visit: Payer: Self-pay | Admitting: *Deleted

## 2011-07-30 ENCOUNTER — Ambulatory Visit (HOSPITAL_COMMUNITY): Payer: Medicare Other

## 2011-07-30 ENCOUNTER — Encounter (HOSPITAL_COMMUNITY): Payer: Self-pay | Admitting: Certified Registered"

## 2011-07-30 ENCOUNTER — Ambulatory Visit (HOSPITAL_COMMUNITY)
Admission: EM | Admit: 2011-07-30 | Discharge: 2011-07-30 | Disposition: A | Discharge status: Home / Self Care | Payer: Medicare Other | Attending: Vascular Surgery | Admitting: Vascular Surgery

## 2011-07-30 ENCOUNTER — Encounter (HOSPITAL_COMMUNITY): Payer: Self-pay | Admitting: *Deleted

## 2011-07-30 ENCOUNTER — Ambulatory Visit (HOSPITAL_COMMUNITY): Payer: Medicare Other | Admitting: Certified Registered"

## 2011-07-30 ENCOUNTER — Encounter (HOSPITAL_COMMUNITY): Admission: EM | Disposition: A | Discharge status: Home / Self Care | Payer: Self-pay | Source: Home / Self Care

## 2011-07-30 DIAGNOSIS — E669 Obesity, unspecified: Secondary | ICD-10-CM | POA: Insufficient documentation

## 2011-07-30 DIAGNOSIS — T82898A Other specified complication of vascular prosthetic devices, implants and grafts, initial encounter: Secondary | ICD-10-CM | POA: Insufficient documentation

## 2011-07-30 DIAGNOSIS — E119 Type 2 diabetes mellitus without complications: Secondary | ICD-10-CM | POA: Insufficient documentation

## 2011-07-30 DIAGNOSIS — Y849 Medical procedure, unspecified as the cause of abnormal reaction of the patient, or of later complication, without mention of misadventure at the time of the procedure: Secondary | ICD-10-CM | POA: Insufficient documentation

## 2011-07-30 DIAGNOSIS — I739 Peripheral vascular disease, unspecified: Secondary | ICD-10-CM | POA: Insufficient documentation

## 2011-07-30 DIAGNOSIS — I120 Hypertensive chronic kidney disease with stage 5 chronic kidney disease or end stage renal disease: Secondary | ICD-10-CM | POA: Insufficient documentation

## 2011-07-30 DIAGNOSIS — N186 End stage renal disease: Secondary | ICD-10-CM | POA: Insufficient documentation

## 2011-07-30 DIAGNOSIS — Z992 Dependence on renal dialysis: Secondary | ICD-10-CM | POA: Insufficient documentation

## 2011-07-30 HISTORY — PX: INSERTION OF DIALYSIS CATHETER: SHX1324

## 2011-07-30 LAB — DIFFERENTIAL
Basophils Relative: 0 % (ref 0–1)
Lymphocytes Relative: 18 % (ref 12–46)
Lymphs Abs: 1.6 10*3/uL (ref 0.7–4.0)
Monocytes Absolute: 0.6 10*3/uL (ref 0.1–1.0)
Monocytes Relative: 7 % (ref 3–12)
Neutro Abs: 6.5 10*3/uL (ref 1.7–7.7)
Neutrophils Relative %: 73 % (ref 43–77)

## 2011-07-30 LAB — BASIC METABOLIC PANEL
BUN: 43 mg/dL — ABNORMAL HIGH (ref 6–23)
CO2: 24 mEq/L (ref 19–32)
Chloride: 99 mEq/L (ref 96–112)
Creatinine, Ser: 10.04 mg/dL — ABNORMAL HIGH (ref 0.50–1.35)
GFR calc Af Amer: 6 mL/min — ABNORMAL LOW (ref >90)
Glucose, Bld: 116 mg/dL — ABNORMAL HIGH (ref 70–99)
Potassium: 4.9 mEq/L (ref 3.5–5.1)

## 2011-07-30 LAB — PROTIME-INR: INR: 1 (ref 0.00–1.49)

## 2011-07-30 LAB — CBC
HCT: 33.3 % — ABNORMAL LOW (ref 39.0–52.0)
Hemoglobin: 10.5 g/dL — ABNORMAL LOW (ref 13.0–17.0)
RBC: 3.34 MIL/uL — ABNORMAL LOW (ref 4.22–5.81)
WBC: 8.9 10*3/uL (ref 4.0–10.5)

## 2011-07-30 LAB — GLUCOSE, CAPILLARY: Glucose-Capillary: 82 mg/dL (ref 70–99)

## 2011-07-30 LAB — SURGICAL PCR SCREEN: Staphylococcus aureus: NEGATIVE

## 2011-07-30 SURGERY — INSERTION OF DIALYSIS CATHETER
Anesthesia: General | Site: Chest | Laterality: Right | Wound class: Clean

## 2011-07-30 MED ORDER — DEXTROSE 5 % IV SOLN
INTRAVENOUS | Status: AC
  Filled 2011-07-30: qty 50

## 2011-07-30 MED ORDER — SODIUM CHLORIDE 0.9 % IV SOLN: INTRAVENOUS | Status: DC

## 2011-07-30 MED ORDER — HEPARIN SODIUM (PORCINE) 1000 UNIT/ML IJ SOLN
INTRAMUSCULAR | Status: DC | PRN
  Administered 2011-07-30: 4.6 mL

## 2011-07-30 MED ORDER — MUPIROCIN 2 % EX OINT
TOPICAL_OINTMENT | CUTANEOUS | Status: AC
  Administered 2011-07-30: 1 via NASAL
  Filled 2011-07-30: qty 22

## 2011-07-30 MED ORDER — SODIUM CHLORIDE 0.9 % IR SOLN
Status: DC | PRN
  Administered 2011-07-30: 1000 mL

## 2011-07-30 MED ORDER — MORPHINE SULFATE 2 MG/ML IJ SOLN: 0.0500 mg/kg | INTRAMUSCULAR | Status: DC | PRN

## 2011-07-30 MED ORDER — HYDROMORPHONE HCL PF 1 MG/ML IJ SOLN: 0.2500 mg | INTRAMUSCULAR | Status: DC | PRN

## 2011-07-30 MED ORDER — SODIUM CHLORIDE 0.9 % IV SOLN
INTRAVENOUS | Status: DC
  Administered 2011-07-30: 13:00:00 via INTRAVENOUS

## 2011-07-30 MED ORDER — LIDOCAINE-EPINEPHRINE 0.5-1:200000 % IJ SOLN
INTRAMUSCULAR | Status: DC | PRN
  Administered 2011-07-30: 50 mL via INTRADERMAL

## 2011-07-30 MED ORDER — CEFUROXIME SODIUM 1.5 G IJ SOLR
INTRAMUSCULAR | Status: AC
  Filled 2011-07-30: qty 1.5

## 2011-07-30 MED ORDER — ONDANSETRON HCL 4 MG/2ML IJ SOLN: 4.0000 mg | Freq: Once | INTRAMUSCULAR | Status: DC | PRN

## 2011-07-30 MED ORDER — DEXTROSE 5 % IV SOLN
1.5000 g | INTRAVENOUS | Status: DC | PRN
  Administered 2011-07-30: 1.5 g via INTRAVENOUS

## 2011-07-30 SURGICAL SUPPLY — 41 items
BAG DECANTER FOR FLEXI CONT (MISCELLANEOUS) ×2 IMPLANT
CATH CANNON HEMO 15F 50CM (CATHETERS) IMPLANT
CATH CANNON HEMO 15FR 19 (HEMODIALYSIS SUPPLIES) IMPLANT
CATH CANNON HEMO 15FR 23CM (HEMODIALYSIS SUPPLIES) ×2 IMPLANT
CATH CANNON HEMO 15FR 31CM (HEMODIALYSIS SUPPLIES) IMPLANT
CATH CANNON HEMO 15FR 32CM (HEMODIALYSIS SUPPLIES) IMPLANT
CLOTH BEACON ORANGE TIMEOUT ST (SAFETY) ×2 IMPLANT
COVER PROBE W GEL 5X96 (DRAPES) IMPLANT
COVER SURGICAL LIGHT HANDLE (MISCELLANEOUS) ×2 IMPLANT
DRAPE C-ARM 42X72 X-RAY (DRAPES) ×2 IMPLANT
DRAPE CHEST BREAST 15X10 FENES (DRAPES) ×2 IMPLANT
GAUZE SPONGE 2X2 8PLY STRL LF (GAUZE/BANDAGES/DRESSINGS) ×1 IMPLANT
GAUZE SPONGE 4X4 16PLY XRAY LF (GAUZE/BANDAGES/DRESSINGS) ×2 IMPLANT
GLOVE BIO SURGEON STRL SZ 6.5 (GLOVE) ×2 IMPLANT
GLOVE BIO SURGEON STRL SZ7.5 (GLOVE) ×2 IMPLANT
GLOVE BIOGEL PI IND STRL 6.5 (GLOVE) ×3 IMPLANT
GLOVE BIOGEL PI IND STRL 7.5 (GLOVE) ×1 IMPLANT
GLOVE BIOGEL PI INDICATOR 6.5 (GLOVE) ×3
GLOVE BIOGEL PI INDICATOR 7.5 (GLOVE) ×1
GOWN STRL NON-REIN LRG LVL3 (GOWN DISPOSABLE) ×4 IMPLANT
KIT BASIN OR (CUSTOM PROCEDURE TRAY) ×2 IMPLANT
KIT ROOM TURNOVER OR (KITS) ×2 IMPLANT
NEEDLE 18GX1X1/2 (RX/OR ONLY) (NEEDLE) ×2 IMPLANT
NEEDLE 22X1 1/2 (OR ONLY) (NEEDLE) ×2 IMPLANT
NEEDLE HYPO 25GX1X1/2 BEV (NEEDLE) ×2 IMPLANT
NS IRRIG 1000ML POUR BTL (IV SOLUTION) ×2 IMPLANT
PACK SURGICAL SETUP 50X90 (CUSTOM PROCEDURE TRAY) ×2 IMPLANT
PAD ARMBOARD 7.5X6 YLW CONV (MISCELLANEOUS) ×4 IMPLANT
SOAP 2 % CHG 4 OZ (WOUND CARE) ×2 IMPLANT
SPONGE GAUZE 2X2 STER 10/PKG (GAUZE/BANDAGES/DRESSINGS) ×1
SUT ETHILON 3 0 PS 1 (SUTURE) ×2 IMPLANT
SUT VICRYL 4-0 PS2 18IN ABS (SUTURE) ×2 IMPLANT
SYR 20CC LL (SYRINGE) ×4 IMPLANT
SYR 30ML LL (SYRINGE) IMPLANT
SYR 5ML LL (SYRINGE) ×4 IMPLANT
SYR CONTROL 10ML LL (SYRINGE) ×2 IMPLANT
SYRINGE 10CC LL (SYRINGE) ×2 IMPLANT
TAPE CLOTH SURG 4X10 WHT LF (GAUZE/BANDAGES/DRESSINGS) ×2 IMPLANT
TOWEL OR 17X24 6PK STRL BLUE (TOWEL DISPOSABLE) ×2 IMPLANT
TOWEL OR 17X26 10 PK STRL BLUE (TOWEL DISPOSABLE) ×2 IMPLANT
WATER STERILE IRR 1000ML POUR (IV SOLUTION) ×2 IMPLANT

## 2011-07-30 NOTE — ED Notes (Signed)
Pt will be transported to short stay at [facility] when facility opens

## 2011-07-30 NOTE — Anesthesia Preprocedure Evaluation (Addendum)
Anesthesia Evaluation  Patient identified by MRN, date of birth, ID band Patient awake    Reviewed: Allergy & Precautions, H&P , NPO status   Airway Mallampati: II TM Distance: <3 FB Neck ROM: Full    Dental  (+) Poor Dentition and Missing   Pulmonary  breath sounds clear to auscultation        Cardiovascular hypertension, Pt. on medications Rhythm:Regular Rate:Normal     Neuro/Psych    GI/Hepatic   Endo/Other  Diabetes mellitus-, Poorly Controlled, Type 2, Insulin DependentMorbid obesity  Renal/GU      Musculoskeletal   Abdominal   Peds  Hematology   Anesthesia Other Findings   Reproductive/Obstetrics                          Anesthesia Physical Anesthesia Plan  ASA: III  Anesthesia Plan:    Post-op Pain Management:    Induction: Intravenous  Airway Management Planned: Simple Face Mask  Additional Equipment:   Intra-op Plan:   Post-operative Plan:   Informed Consent: I have reviewed the patients History and Physical, chart, labs and discussed the procedure including the risks, benefits and alternatives for the proposed anesthesia with the patient or authorized representative who has indicated his/her understanding and acceptance.   Dental advisory given  Plan Discussed with: Anesthesiologist, CRNA and Surgeon  Anesthesia Plan Comments:         Anesthesia Quick Evaluation

## 2011-07-30 NOTE — Progress Notes (Signed)
Pt. Reports that he doesn't have anyone to come & pick him up today

## 2011-07-30 NOTE — ED Notes (Signed)
Pt reports he was in the shower and had his dialysis catheter covered with a plastic bag, pt reports the tip of his cath got caught and "ripped" tip off, EMS reports large ammt of blood noted on scene, pt A&0x4

## 2011-07-30 NOTE — Progress Notes (Signed)
PCXR taken.

## 2011-07-30 NOTE — Transfer of Care (Signed)
Immediate Anesthesia Transfer of Care Note  Patient: Julian [name]  Procedure(s) Performed: Procedure(s) (LRB): INSERTION OF DIALYSIS CATHETER (Right)  Patient Location: PACU  Anesthesia Type: MAC  Level of Consciousness: awake, alert  and oriented  Airway & Oxygen Therapy: Patient Spontanous Breathing  Post-op Assessment: Report given to PACU RN  Post vital signs: Reviewed and stable  Complications: No apparent anesthesia complications

## 2011-07-30 NOTE — H&P (Signed)
Julian Lyons, MD Physician Signed  ED Provider Notes 07/30/2011 4:05 AM  History      CSN: [id number]   Arrival date & time 07/30/11  0350    None        Chief Complaint   Patient presents with   .  Bleeding/Bruising      (Consider location/radiation/quality/duration/timing/severity/associated sxs/prior treatment) HPI Comments: Patient is a dialysis patient who was showering this morning.  He had a bag covering the dialysis catheter in his right subclavian.  He was attempting to cut the bag off of it he had put there while showering.  He accidentally cut the line and bled.  911 was called and the bleeding was controlled with hemostat.  He is due to have dialysis this morning.  No other complaints.  Feels fine.     The history is provided by the patient.     Past Medical History   Diagnosis  Date   .  Chronic kidney disease     .  Hypertension     .  Diabetes mellitus     .  Peripheral vascular disease     .  Obesity     .  Renal disorder         Past Surgical History   Procedure  Date   .  Above knee leg amputation         Right AKA   .  Eye surgery     .  Av fistula placement     .  Arteriovenous graft placement     .  Pr vein bypass graft,aorto-fem-pop         Family History   Problem  Relation  Age of Onset   .  Hypertension  Mother     .  Diabetes  Father         History   Substance Use Topics   .  Smoking status:  Never Smoker    .  Smokeless tobacco:  Never Used   .  Alcohol Use:  No        Review of Systems  All other systems reviewed and are negative.    Allergies    Review of patient's allergies indicates no known allergies.    Home Medications       Current Outpatient Rx   Name  Route  Sig  Dispense  Refill   .  NEPHRO-VITE 0.8 MG PO TABS  Oral  Take 0.8 mg by mouth at bedtime.         Marland Kitchen  CINACALCET HCL 60 MG PO TABS  Oral  Take 120 mg by mouth daily. Takes 2 tablets at lunch       .  DILTIAZEM HCL ER COATED BEADS 180 MG PO CP24   Oral  Take 180 mg by mouth daily.         Marland Kitchen  HYDRALAZINE HCL 50 MG PO TABS  Oral  Take 50 mg by mouth 3 (three) times daily.         .  INSULIN GLARGINE 100 UNIT/ML [place] SOLN  Subcutaneous  Inject 14-20 Units into the skin 2 (two) times daily. Patient takes 20units in the morning and 14units at bedtime       .  LANTHANUM CARBONATE 1000 MG PO CHEW  Oral  Chew 1,000 mg by mouth 2 (two) times daily with a meal.         .  METHOCARBAMOL 750 MG PO TABS  Oral  Take 750 mg by mouth as needed.            BP 148/64  Pulse 93  Temp(Src) 98.3 F (36.8 C) (Oral)  Resp 18  SpO2 97%   Physical Exam  Nursing note and vitals reviewed. Constitutional: He is oriented to person, place, and time. He appears well-developed and well-nourished.  HENT:   Head: Normocephalic and atraumatic.  Neck: Normal range of motion. Neck supple.  Cardiovascular: Normal rate and regular rhythm.   Abdominal: Soft. Bowel sounds are normal. He exhibits no distension.  Musculoskeletal: Normal range of motion.  Neurological: He is alert and oriented to person, place, and time. Coordination normal.  Skin: Skin is warm and dry. He is not diaphoretic.     ED Course    Procedures (including critical care time)   Labs Reviewed   CBC   DIFFERENTIAL   BASIC METABOLIC PANEL    No results found.     No diagnosis found.        MDM    I spoke with Dr. Durwin Nora from vascular surgery who wants patient transferred to Short Stay at [facility].  Will make these arrangements and transfer there.     Addendum:  The patient has been re-examined and re-evaluated.  The patient's history and physical has been reviewed and is unchanged.    Julian [name] is a 66 y.o. male is being admitted with Complication of vascular access for dialysis [999.9, E876.9] bleeding. All the risks, benefits and other treatment options have been discussed with the patient. The patient has consented to proceed with Procedure(s): INSERTION OF DIALYSIS  CATHETER as a surgical intervention.Pt cut off hub of catheter.  Will replace Right HD cath.  Heily Carlucci 07/30/2011 1:17 PM Vascular and Vein Surgery            Julian Lyons, MD 07/30/11 [id number]

## 2011-07-30 NOTE — ED Notes (Signed)
Patient is upset at this time states he has been here way to long. Patient states if we were just going to get him in this room he would just call his caregiver and go home. Patient was told that phone calls were out to [facility] Short Stay and it might be awhile. Patient states that he is upset and if he is out to [facility] by 8am he was calling his caregiver and going home. RN Wilkie Aye made aware.

## 2011-07-30 NOTE — Preoperative (Signed)
Beta Blockers   Reason not to administer Beta Blockers:Not Applicable 

## 2011-07-30 NOTE — Anesthesia Postprocedure Evaluation (Signed)
  Anesthesia Post-op Note  Patient: Julian [name]  Procedure(s) Performed: Procedure(s) (LRB): INSERTION OF DIALYSIS CATHETER (Right)  Patient Location: PACU  Anesthesia Type: MAC  Level of Consciousness: awake, alert  and oriented  Airway and Oxygen Therapy: Patient Spontanous Breathing  Post-op Pain: none  Post-op Assessment: Post-op Vital signs reviewed  Post-op Vital Signs: Reviewed and stable  Complications: No apparent anesthesia complications

## 2011-07-30 NOTE — Op Note (Signed)
OPERATIVE REPORT  DATE OF SURGERY: 07/30/2011  PATIENT: Julian [name], 66 y.o. male MRN: [id number]  DOB: Mar 11, 1946  PRE-OPERATIVE DIAGNOSIS: End-stage renal disease with nonfunctioning catheter  POST-OPERATIVE DIAGNOSIS:  Same  PROCEDURE: Replacement of right IJ hemodialysis catheter  SURGEON:  Gretta Began, M.D.  PHYSICIAN ASSISTANT: None  ANESTHESIA:  1% lidocaine local  EBL: Minimal ml  Total I/O In: 50 [I.V.:50] Out: -   BLOOD ADMINISTERED: None  DRAINS: None  SPECIMEN: None  COUNTS CORRECT:  YES  PLAN OF CARE: PACU   PATIENT DISPOSITION:  PACU - hemodynamically stable  PROCEDURE DETAILS: The patient was taken to the operating room placed supine position there the right  Any sterile fashion. The patient had a indwelling hemodialysis catheter where he cut off one of the hubs. The neck and the catheter prepped in the sterile fashion. The left using local anesthesia an incision was made at the site into the jugular vein. The catheter was isolated this area and was occluded with a hemostat and was divided distally. A guidewire was passed through this catheter to the level of the right atrium as confirmed with fluoroscopy. A the catheter was removed and the dilator peel-away sheath was placed over the guidewire. The dilator and guidewire removed. A 23 cm he does Was passed through the peel-away sheath which was removed. The tips were placed the level of the distal right atrium. The catheters brought through a separate stab incision under tunnel leaving the cuff in the tunnel. The 2 lm ports were attached in both lumens flushed and aspirated easily and were locked with 1 cc of. The catheter was secured to skin 3-0 nylon stitch and the entry site was closed with 4 septic 0 Vicryl stitch. The remaining portion of the end indwelling catheter was removed by removing the cuff which is under the skin under local anesthetic. Sterile dressing was applied the patient was taken to the  recovery March 6 race pending this is been probably dictating an operative note on Teola Bradley intensification   Gretta Began, M.D. 07/30/2011 2:50 PM

## 2011-07-30 NOTE — ED Provider Notes (Signed)
History     CSN: [id number]  Arrival date & time 07/30/11  0350   None     Chief Complaint  Patient presents with  . Bleeding/Bruising    (Consider location/radiation/quality/duration/timing/severity/associated sxs/prior treatment) HPI Comments: Patient is a dialysis patient who was showering this morning.  He had a bag covering the dialysis catheter in his right subclavian.  He was attempting to cut the bag off of it he had put there while showering.  He accidentally cut the line and bled.  911 was called and the bleeding was controlled with hemostat.  He is due to have dialysis this morning.  No other complaints.  Feels fine.    The history is provided by the patient.    Past Medical History  Diagnosis Date  . Chronic kidney disease   . Hypertension   . Diabetes mellitus   . Peripheral vascular disease   . Obesity   . Renal disorder     Past Surgical History  Procedure Date  . Above knee leg amputation     Right AKA  . Eye surgery   . Av fistula placement   . Arteriovenous graft placement   . Pr vein bypass graft,aorto-fem-pop     Family History  Problem Relation Age of Onset  . Hypertension Mother   . Diabetes Father     History  Substance Use Topics  . Smoking status: Never Smoker   . Smokeless tobacco: Never Used  . Alcohol Use: No      Review of Systems  All other systems reviewed and are negative.    Allergies  Review of patient's allergies indicates no known allergies.  Home Medications   Current Outpatient Rx  Name Route Sig Dispense Refill  . NEPHRO-VITE 0.8 MG PO TABS Oral Take 0.8 mg by mouth at bedtime.      Marland Kitchen CINACALCET HCL 60 MG PO TABS Oral Take 120 mg by mouth daily. Takes 2 tablets at lunch    . DILTIAZEM HCL ER COATED BEADS 180 MG PO CP24 Oral Take 180 mg by mouth daily.      Marland Kitchen HYDRALAZINE HCL 50 MG PO TABS Oral Take 50 mg by mouth 3 (three) times daily.      . INSULIN GLARGINE 100 UNIT/ML [place] SOLN Subcutaneous Inject 14-20  Units into the skin 2 (two) times daily. Patient takes 20units in the morning and 14units at bedtime    . LANTHANUM CARBONATE 1000 MG PO CHEW Oral Chew 1,000 mg by mouth 2 (two) times daily with a meal.      . METHOCARBAMOL 750 MG PO TABS Oral Take 750 mg by mouth as needed.        BP 148/64  Pulse 93  Temp(Src) 98.3 F (36.8 C) (Oral)  Resp 18  SpO2 97%  Physical Exam  Nursing note and vitals reviewed. Constitutional: He is oriented to person, place, and time. He appears well-developed and well-nourished.  HENT:  Head: Normocephalic and atraumatic.  Neck: Normal range of motion. Neck supple.  Cardiovascular: Normal rate and regular rhythm.   Abdominal: Soft. Bowel sounds are normal. He exhibits no distension.  Musculoskeletal: Normal range of motion.  Neurological: He is alert and oriented to person, place, and time. Coordination normal.  Skin: Skin is warm and dry. He is not diaphoretic.    ED Course  Procedures (including critical care time)   Labs Reviewed  CBC  DIFFERENTIAL  BASIC METABOLIC PANEL   No results found.   No  diagnosis found.    MDM  I spoke with Dr. Durwin Nora from vascular surgery who wants patient transferred to Short Stay at [facility].  Will make these arrangements and transfer there.        Geoffery Lyons, MD 07/30/11 [phone]

## 2011-07-30 NOTE — Progress Notes (Signed)
Pt meeting discharge criteria from PACU; XRAY has been taken; is dressed and ready to go to short stay for discharge instructions to home.

## 2011-07-30 NOTE — Progress Notes (Signed)
Labs, ekg ,cxr in epic

## 2011-07-31 NOTE — Anesthesia Postprocedure Evaluation (Signed)
  Anesthesia Post-op Note  Patient: Julian [name]  Procedure(s) Performed: Procedure(s) (LRB): INSERTION OF DIALYSIS CATHETER (Right)  Patient Location: PACU  Anesthesia Type: MAC  Level of Consciousness: awake, alert  and oriented  Airway and Oxygen Therapy: Patient Spontanous Breathing  Post-op Pain: none  Post-op Assessment: Post-op Vital signs reviewed  Post-op Vital Signs: Reviewed  Complications: No apparent anesthesia complications

## 2011-08-02 ENCOUNTER — Encounter (HOSPITAL_COMMUNITY): Payer: Self-pay | Admitting: Vascular Surgery

## 2011-08-05 ENCOUNTER — Other Ambulatory Visit: Payer: Self-pay

## 2011-08-05 DIAGNOSIS — T82898A Other specified complication of vascular prosthetic devices, implants and grafts, initial encounter: Secondary | ICD-10-CM

## 2011-08-11 ENCOUNTER — Encounter: Payer: Self-pay | Admitting: Vascular Surgery

## 2011-08-12 ENCOUNTER — Ambulatory Visit (INDEPENDENT_AMBULATORY_CARE_PROVIDER_SITE_OTHER): Payer: PRIVATE HEALTH INSURANCE | Admitting: Vascular Surgery

## 2011-08-12 ENCOUNTER — Encounter: Payer: Self-pay | Admitting: Vascular Surgery

## 2011-08-12 ENCOUNTER — Ambulatory Visit (INDEPENDENT_AMBULATORY_CARE_PROVIDER_SITE_OTHER): Payer: Medicare Other | Admitting: Thoracic Diseases

## 2011-08-12 VITALS — BP 128/63 | HR 79 | Resp 16 | Ht 70.0 in | Wt 138.9 lb

## 2011-08-12 DIAGNOSIS — T82898A Other specified complication of vascular prosthetic devices, implants and grafts, initial encounter: Secondary | ICD-10-CM

## 2011-08-12 DIAGNOSIS — N186 End stage renal disease: Secondary | ICD-10-CM

## 2011-08-12 DIAGNOSIS — Z48812 Encounter for surgical aftercare following surgery on the circulatory system: Secondary | ICD-10-CM

## 2011-08-12 NOTE — Progress Notes (Signed)
Left brachiobasilic AV transposition fistula duplex performed @ [facility] 08/12/2011

## 2011-08-13 NOTE — Progress Notes (Signed)
VASCULAR AND VEIN SURGERY PROGRESS NOTE  POST-OP HEMODIALYSIS ACCESS  CC: Pt here for evaluation of 1st stage BVT by Dr Imogene Burn  HPI: Julian [name] is a 66 y.o. male who is S/P creation/revision of left upper extremity Hemodialysis access. The patient denies symptoms of numbness, tingling, weakness; denies pain in the operative limb.   Past Medical History  Diagnosis Date  . Chronic kidney disease   . Hypertension   . Diabetes mellitus   . Peripheral vascular disease   . Obesity   . Renal disorder    Past Surgical History  Procedure Date  . Above knee leg amputation     Right AKA  . Eye surgery   . Av fistula placement   . Arteriovenous graft placement   . Pr vein bypass graft,aorto-fem-pop   . Insertion of dialysis catheter 07/30/2011    Procedure: INSERTION OF DIALYSIS CATHETER;  Surgeon: Larina Earthly, MD;  Location: [facility] OR;  Service: Vascular;  Laterality: Right;  removal of right dialysis catheter and exchange/ insertion of new right dialysis catheter   History   Social History  . Marital Status: Single    Spouse Name: N/A    Number of Children: N/A  . Years of Education: N/A   Occupational History  . Not on file.   Social History Main Topics  . Smoking status: Never Smoker   . Smokeless tobacco: Never Used  . Alcohol Use: No  . Drug Use: No  . Sexually Active:    Other Topics Concern  . Not on file   Social History Narrative  . No narrative on file   Current Outpatient Prescriptions  Medication Sig Dispense Refill  . b complex-vitamin c-folic acid (NEPHRO-VITE) 0.8 MG TABS Take 0.8 mg by mouth at bedtime.        . cinacalcet (SENSIPAR) 60 MG tablet Take 120 mg by mouth daily. Takes 2 tablets at lunch      . diltiazem (CARDIZEM CD) 180 MG 24 hr capsule Take 180 mg by mouth daily.        . hydrALAZINE (APRESOLINE) 50 MG tablet Take 50 mg by mouth 3 (three) times daily.        . insulin glargine (LANTUS) 100 UNIT/ML injection Inject 10-20 Units into the  skin 2 (two) times daily. Patient takes 20units in the morning and 10 units at bedtime      . sevelamer (RENAGEL) 800 MG tablet Take 800 mg by mouth 2 (two) times daily with a meal.       Current Facility-Administered Medications  Medication Dose Route Frequency Provider Last Rate Last Dose  . cefUROXime (ZINACEF) 1.5 g in dextrose 5 % 50 mL IVPB  1.5 g Intravenous Once Fransisco Hertz, MD       ROS : reviewed with no changes from 07/30/11  Significant Diagnostic Studies: CBC Lab Results  Component Value Date   WBC 8.9 07/30/2011   HGB 10.5* 07/30/2011   HCT 33.3* 07/30/2011   MCV 99.7 07/30/2011   PLT 146* 07/30/2011    BMET    Component Value Date/Time   NA 137 07/30/2011 0406   K 4.9 07/30/2011 0406   CL 99 07/30/2011 0406   CO2 24 07/30/2011 0406   GLUCOSE 116* 07/30/2011 0406   BUN 43* 07/30/2011 0406   CREATININE 10.04* 07/30/2011 0406   CALCIUM 10.3 07/30/2011 0406   GFRNONAA 5* 07/30/2011 0406   GFRAA 6* 07/30/2011 0406    COAG Lab Results  Component  Value Date   INR 1.00 07/30/2011   No results found for this basename: PTT    Vital Signs  BP Readings from Last 3 Encounters:  08/12/11 128/63  07/30/11 177/89  07/30/11 177/89   Temp Readings from Last 3 Encounters:  07/30/11 97.6 F (36.4 C) Oral  07/30/11 97.6 F (36.4 C) Oral  06/20/11 97.7 F (36.5 C) Oral   SpO2 Readings from Last 3 Encounters:  07/30/11 100%  07/30/11 100%  06/20/11 97%   Pulse Readings from Last 3 Encounters:  08/12/11 79  07/30/11 91  07/30/11 91     Physical Examination WDWN male IN NAD HEENT WNL Lungs - Clear Heart RRR without Murmur Abd - soft NABS Extremities - warm 2+ radial pulses left upper Incision is healing well, skin color is normal , hand grip is 5/5, sensation in digits is intact;  There is a good thrill and good bruit in the Left BVT.  Vasc Lab: Size is > 7mm throughout  Assessment/Plan  Julian [name] is a 66 y.o. 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

## 2011-08-17 ENCOUNTER — Other Ambulatory Visit: Payer: Self-pay

## 2011-08-18 ENCOUNTER — Encounter (HOSPITAL_COMMUNITY): Payer: Self-pay | Admitting: Respiratory Therapy

## 2011-08-20 NOTE — Procedures (Unsigned)
VASCULAR LAB EXAM  INDICATION:  End-stage renal disease, nonmaturing arteriovenous fistula.  HISTORY: Diabetes: Cardiac: Hypertension:  EXAM:  Left brachial to basilic arteriovenous fistula duplex.  IMPRESSION: 1. Patent left brachiobasilic arteriovenous transposition fistula. 2. Two areas with prominent valve leaflets identified in the distal     upper arm venous outflow segment, with elevated velocities present     which may suggest stenosis. 3. A large branch present at the mid venous outflow segment measuring     0.26 cm in diameter and velocity as noted on the worksheet. 4. The vessel depths and diameters obtained of the venous outflow and     are as noted on the worksheet.  ___________________________________________ Janetta Hora. Fields, MD  SH/MEDQ  D:  08/12/2011  T:  08/12/2011  Job:  [id number]

## 2011-08-23 ENCOUNTER — Encounter (HOSPITAL_COMMUNITY): Payer: Self-pay | Admitting: *Deleted

## 2011-08-23 MED ORDER — CEFAZOLIN SODIUM 1-5 GM-% IV SOLN
1.0000 g | Freq: Once | INTRAVENOUS | Status: AC
  Administered 2011-08-24: 1 g via INTRAVENOUS
  Filled 2011-08-23: qty 50

## 2011-08-23 MED ORDER — SODIUM CHLORIDE 0.9 % IV SOLN: INTRAVENOUS | Status: DC

## 2011-08-24 ENCOUNTER — Ambulatory Visit (HOSPITAL_COMMUNITY)
Admission: RE | Admit: 2011-08-24 | Discharge: 2011-08-24 | Disposition: A | Discharge status: Home / Self Care | Payer: PRIVATE HEALTH INSURANCE | Source: Ambulatory Visit | Attending: Vascular Surgery | Admitting: Vascular Surgery

## 2011-08-24 ENCOUNTER — Encounter (HOSPITAL_COMMUNITY)
Admission: RE | Disposition: A | Discharge status: Home / Self Care | Payer: Self-pay | Source: Ambulatory Visit | Attending: Vascular Surgery

## 2011-08-24 ENCOUNTER — Encounter (HOSPITAL_COMMUNITY): Payer: Self-pay | Admitting: Anesthesiology

## 2011-08-24 ENCOUNTER — Encounter (HOSPITAL_COMMUNITY): Payer: Self-pay | Admitting: *Deleted

## 2011-08-24 ENCOUNTER — Ambulatory Visit (HOSPITAL_COMMUNITY): Payer: PRIVATE HEALTH INSURANCE | Admitting: Anesthesiology

## 2011-08-24 DIAGNOSIS — N186 End stage renal disease: Secondary | ICD-10-CM | POA: Insufficient documentation

## 2011-08-24 DIAGNOSIS — I120 Hypertensive chronic kidney disease with stage 5 chronic kidney disease or end stage renal disease: Secondary | ICD-10-CM | POA: Insufficient documentation

## 2011-08-24 DIAGNOSIS — E669 Obesity, unspecified: Secondary | ICD-10-CM | POA: Insufficient documentation

## 2011-08-24 DIAGNOSIS — E119 Type 2 diabetes mellitus without complications: Secondary | ICD-10-CM | POA: Insufficient documentation

## 2011-08-24 HISTORY — PX: OTHER SURGICAL HISTORY: SHX169

## 2011-08-24 LAB — GLUCOSE, CAPILLARY
Glucose-Capillary: 199 mg/dL — ABNORMAL HIGH (ref 70–99)
Glucose-Capillary: 130 mg/dL — ABNORMAL HIGH (ref 70–99)

## 2011-08-24 SURGERY — TRANSPOSITION, VEIN, BASILIC
Anesthesia: General | Site: Arm Upper | Laterality: Left | Wound class: Clean

## 2011-08-24 MED ORDER — SODIUM CHLORIDE 0.9 % IR SOLN
Status: DC | PRN
  Administered 2011-08-24: 08:00:00

## 2011-08-24 MED ORDER — THROMBIN 20000 UNITS EX KIT
PACK | CUTANEOUS | Status: DC | PRN
  Administered 2011-08-24: 10:00:00 via TOPICAL

## 2011-08-24 MED ORDER — 0.9 % SODIUM CHLORIDE (POUR BTL) OPTIME
TOPICAL | Status: DC | PRN
  Administered 2011-08-24: 1000 mL

## 2011-08-24 MED ORDER — DEXTROSE 5 % IV SOLN
INTRAVENOUS | Status: DC | PRN
  Administered 2011-08-24: 08:00:00 via INTRAVENOUS

## 2011-08-24 MED ORDER — ONDANSETRON HCL 4 MG/2ML IJ SOLN: 4.0000 mg | Freq: Once | INTRAMUSCULAR | Status: DC | PRN

## 2011-08-24 MED ORDER — MORPHINE SULFATE 2 MG/ML IJ SOLN: 0.0500 mg/kg | INTRAMUSCULAR | Status: DC | PRN

## 2011-08-24 MED ORDER — PHENYLEPHRINE HCL 10 MG/ML IJ SOLN
INTRAMUSCULAR | Status: DC | PRN
  Administered 2011-08-24: 80 ug via INTRAVENOUS
  Administered 2011-08-24: 40 ug via INTRAVENOUS
  Administered 2011-08-24: 80 ug via INTRAVENOUS
  Administered 2011-08-24: 40 ug via INTRAVENOUS

## 2011-08-24 MED ORDER — MIDAZOLAM HCL 5 MG/5ML IJ SOLN
INTRAMUSCULAR | Status: DC | PRN
  Administered 2011-08-24 (×2): 1 mg via INTRAVENOUS

## 2011-08-24 MED ORDER — HYDROMORPHONE HCL PF 1 MG/ML IJ SOLN: 0.2500 mg | INTRAMUSCULAR | Status: DC | PRN

## 2011-08-24 MED ORDER — PROPOFOL 10 MG/ML IV EMUL
INTRAVENOUS | Status: DC | PRN
  Administered 2011-08-24: 100 mg via INTRAVENOUS
  Administered 2011-08-24 (×2): 20 mg via INTRAVENOUS

## 2011-08-24 MED ORDER — LIDOCAINE HCL (CARDIAC) 20 MG/ML IV SOLN
INTRAVENOUS | Status: DC | PRN
  Administered 2011-08-24: 100 mg via INTRAVENOUS

## 2011-08-24 MED ORDER — SODIUM CHLORIDE 0.9 % IV SOLN
INTRAVENOUS | Status: DC | PRN
  Administered 2011-08-24 (×2): via INTRAVENOUS

## 2011-08-24 MED ORDER — ONDANSETRON HCL 4 MG/2ML IJ SOLN
INTRAMUSCULAR | Status: DC | PRN
  Administered 2011-08-24: 4 mg via INTRAVENOUS

## 2011-08-24 MED ORDER — FENTANYL CITRATE 0.05 MG/ML IJ SOLN
INTRAMUSCULAR | Status: DC | PRN
  Administered 2011-08-24: 25 ug via INTRAVENOUS
  Administered 2011-08-24 (×2): 50 ug via INTRAVENOUS

## 2011-08-24 SURGICAL SUPPLY — 41 items
CANISTER SUCTION 2500CC (MISCELLANEOUS) ×2 IMPLANT
CLIP TI MEDIUM 6 (CLIP) ×2 IMPLANT
CLIP TI WIDE RED SMALL 6 (CLIP) ×2 IMPLANT
CLOTH BEACON ORANGE TIMEOUT ST (SAFETY) ×2 IMPLANT
COVER PROBE W GEL 5X96 (DRAPES) ×2 IMPLANT
COVER SURGICAL LIGHT HANDLE (MISCELLANEOUS) ×4 IMPLANT
DECANTER SPIKE VIAL GLASS SM (MISCELLANEOUS) ×2 IMPLANT
DERMABOND ADVANCED (GAUZE/BANDAGES/DRESSINGS) ×1
DERMABOND ADVANCED .7 DNX12 (GAUZE/BANDAGES/DRESSINGS) ×1 IMPLANT
DRAIN PENROSE 1/2X12 LTX STRL (WOUND CARE) IMPLANT
ELECT REM PT RETURN 9FT ADLT (ELECTROSURGICAL) ×2
ELECTRODE REM PT RTRN 9FT ADLT (ELECTROSURGICAL) ×1 IMPLANT
GLOVE BIO SURGEON STRL SZ 6.5 (GLOVE) ×8 IMPLANT
GLOVE BIO SURGEON STRL SZ7 (GLOVE) ×4 IMPLANT
GLOVE BIOGEL PI IND STRL 6.5 (GLOVE) ×3 IMPLANT
GLOVE BIOGEL PI IND STRL 7.5 (GLOVE) ×1 IMPLANT
GLOVE BIOGEL PI INDICATOR 6.5 (GLOVE) ×3
GLOVE BIOGEL PI INDICATOR 7.5 (GLOVE) ×1
GLOVE SURG SS PI 6.5 STRL IVOR (GLOVE) ×4 IMPLANT
GLOVE SURG SS PI 7.0 STRL IVOR (GLOVE) ×2 IMPLANT
GOWN PREVENTION PLUS XLARGE (GOWN DISPOSABLE) ×4 IMPLANT
GOWN STRL NON-REIN LRG LVL3 (GOWN DISPOSABLE) ×10 IMPLANT
KIT BASIN OR (CUSTOM PROCEDURE TRAY) ×2 IMPLANT
KIT ROOM TURNOVER OR (KITS) ×2 IMPLANT
NS IRRIG 1000ML POUR BTL (IV SOLUTION) ×2 IMPLANT
PACK CV ACCESS (CUSTOM PROCEDURE TRAY) ×2 IMPLANT
PAD ARMBOARD 7.5X6 YLW CONV (MISCELLANEOUS) ×4 IMPLANT
SPONGE SURGIFOAM ABS GEL 100 (HEMOSTASIS) IMPLANT
SUT MNCRL AB 4-0 PS2 18 (SUTURE) ×4 IMPLANT
SUT PROLENE 6 0 BV (SUTURE) ×4 IMPLANT
SUT PROLENE 7 0 BV 1 (SUTURE) ×4 IMPLANT
SUT SILK 2 0 SH (SUTURE) ×6 IMPLANT
SUT SILK 3 0 (SUTURE) ×1
SUT SILK 3-0 18XBRD TIE 12 (SUTURE) ×1 IMPLANT
SUT VIC AB 3-0 SH 27 (SUTURE) ×2
SUT VIC AB 3-0 SH 27X BRD (SUTURE) ×2 IMPLANT
SUT VICRYL 4-0 PS2 18IN ABS (SUTURE) ×2 IMPLANT
TOWEL OR 17X24 6PK STRL BLUE (TOWEL DISPOSABLE) ×2 IMPLANT
TOWEL OR 17X26 10 PK STRL BLUE (TOWEL DISPOSABLE) ×2 IMPLANT
UNDERPAD 30X30 INCONTINENT (UNDERPADS AND DIAPERS) ×2 IMPLANT
WATER STERILE IRR 1000ML POUR (IV SOLUTION) ×2 IMPLANT

## 2011-08-24 NOTE — Transfer of Care (Signed)
Immediate Anesthesia Transfer of Care Note  Patient: Julian [name]  Procedure(s) Performed: Procedure(s) (LRB): BASCILIC VEIN TRANSPOSITION (Left)  Patient Location: PACU  Anesthesia Type: General  Level of Consciousness: awake, sedated and patient cooperative  Airway & Oxygen Therapy: Patient Spontanous Breathing and Patient connected to nasal cannula oxygen  Post-op Assessment: Report given to PACU RN and Post -op Vital signs reviewed and stable  Post vital signs: Reviewed and stable  Complications: No apparent anesthesia complications

## 2011-08-24 NOTE — Preoperative (Signed)
Beta Blockers   Reason not to administer Beta Blockers:Not Applicable 

## 2011-08-24 NOTE — Op Note (Signed)
OPERATIVE NOTE   PROCEDURE: 1. Left second stage basilic vein transposition (brachiobasilic arteriovenous fistula) placement 2. Arterial anastomosis revision  PRE-OPERATIVE DIAGNOSIS: end stage renal disease , successful first stage basilic vein transposition   POST-OPERATIVE DIAGNOSIS: same as above  SURGEON: [name], MD  ASSISTANT(S): Newton Pigg, PAC   ANESTHESIA: general  ESTIMATED BLOOD LOSS: 50 cc  FINDING(S): 1. Some evidence of phlebitis in basilic vein with associated peri-venous scarring 2. Poor blood flow in distal fistula 3. Diseased atherosclerotic brachial artery 4. Improved thrill in second stage basilic vein transposition  5. Dopplerable left radial signal  SPECIMEN(S):  none  INDICATIONS:   Julian [name] is a 66 y.o. male who  presents with end stage renal disease and s/p success left first stage basilic vein transposition.  The patient is scheduled for left second stage basilic vein transposition.  The patient is aware the risks include but are not limited to: bleeding, infection, steal syndrome, nerve damage, ischemic monomelic neuropathy, failure to mature, and need for additional procedures.  The patient is aware of the risks of the procedure and elects to proceed forward.  DESCRIPTION: After written full informed consent was obtained from the patient, the patient was taken back to the operating room. Prior to induction, the patient was given IV antibiotics. After obtaining adequate anesthesia, the patient was prepped and draped in the standard fashion for left arm access procedure.  I turned my attention first to identifying the patient's brachiobasilic arteriovenous fistula. Using SonoSite guidance, the location of this fistula was marked out on the skin. I made an longitudinal incision over the fistula from its arterial anastomosis up to its axillary extent. I carefully dissected the fistula away from its adjacent nerves. Note, there was evidence  of some phlebitis throughout with perivenous scarring through the course of this fistula.  Eventually the entirety of this fistula was mobilized. The basilic vein was 6-8 mm throughout. There was poor pulse in the fistula near the arterial anastomosis, so I decided to sacrifice the distal segment of the fistula and redo the arterial anastomosis.  I dissected out the brachial artery more proximally, and it appeared to be 3 mm in diameter and quite diseased as it was calcified. I marked the anterior surface of the basilic vein with a marker. I then tied off the distal basilic vein with one 2-0 silk tie and one medium titanium clip.  I then transected the basilic vein proximallly and injected heparinized saline into the vein and clamped it.  I tunneled in the subcutaneous tissue with a curved metal Gore tunneler above the level of the bicipital fascia. I tie the vein to the inner cannula with a 2-0 silk and passed the vein, taking care to maintain orientation. I released the vein sharply from the inner cannula and removed the metal tunnel. I inspected both end of the tunnel and felt I had to release some of the fascia on the proximal end to avoid choking off the vein. I clamped the brachial artery proximally and distally and made an arteriotomy with a 11-blade and extended it with a micro Potts scissor. The vein was sewn to the artery in an end to side configuration with a running stitch of 6-0 Prolene. Prior to completing this new anastomosis, I allowed the artery and vein to bleed. There was no clot visualized from either. I completed the anastomosis in the usual fashion. The deep subcutaneous tissue was inspected for bleeding. Bleeding was controlled with electrocautery and placement of  large pieces of thrombin and gelfoam. I washed out the surgical site after waiting a few minutes, and there was no further bleeding. The deep subcutaneous tissue was reapproximated with a running stitch of 3-0 Vicryl to eliminate  some of the dead space. The superficial subcutaneous tissue was then reapproximated along the incision line with a running stitch of 3-0 Vicryl. The skin was then reapproximated with a running stitch of 4-0 Monocryl.  There was a much stronger thrill at this point in the case with a dopplerable left radial signal that did not augment with venous outflow compression.  COMPLICATIONS: none  CONDITION: stable  [name], MD [facility] [place] Office: [phone] Pager: [phone]  08/24/2011, 11:00 AM

## 2011-08-24 NOTE — Anesthesia Procedure Notes (Signed)
Procedure Name: LMA Insertion Date/Time: 08/24/2011 8:15 AM Performed by: Darcey Nora B Pre-anesthesia Checklist: Patient identified, Emergency Drugs available, Suction available and Patient being monitored Oxygen Delivery Method: Circle system utilized Preoxygenation: Pre-oxygenation with 100% oxygen Intubation Type: IV induction Ventilation: Mask ventilation without difficulty LMA: LMA inserted LMA Size: 5.0 Dental Injury: Teeth and Oropharynx as per pre-operative assessment

## 2011-08-24 NOTE — Anesthesia Postprocedure Evaluation (Signed)
Anesthesia Post Note  Patient: Julian [name]  Procedure(s) Performed: Procedure(s) (LRB): BASCILIC VEIN TRANSPOSITION (Left)  Anesthesia type: general  Patient location: PACU  Post pain: Pain level controlled  Post assessment: Patient's Cardiovascular Status Stable  Last Vitals:  Filed Vitals:   08/24/11 1248  BP:   Pulse:   Temp: 36.4 C  Resp:     Post vital signs: Reviewed and stable  Level of consciousness: sedated  Complications: No apparent anesthesia complications

## 2011-08-24 NOTE — H&P (Signed)
[facility] [place]  Brief Access History and Physical  History of Present Illness  Julian [name] is a 66 y.o. male who presents with chief complaint: end stage renal disease.  The patient presents today for Left 2nd stage BVT.    Past Medical History  Diagnosis Date  . Chronic kidney disease   . Hypertension   . Diabetes mellitus   . Peripheral vascular disease   . Obesity   . Renal disorder     Past Surgical History  Procedure Date  . Above knee leg amputation     Right AKA  . Av fistula placement   . Arteriovenous graft placement   . Pr vein bypass graft,aorto-fem-pop   . Insertion of dialysis catheter 07/30/2011    Procedure: INSERTION OF DIALYSIS CATHETER;  Surgeon: Larina Earthly, MD;  Location: [facility] OR;  Service: Vascular;  Laterality: Right;  removal of right dialysis catheter and exchange/ insertion of new right dialysis catheter  . Eye surgery     History   Social History  . Marital Status: Single    Spouse Name: N/A    Number of Children: N/A  . Years of Education: N/A   Occupational History  . Not on file.   Social History Main Topics  . Smoking status: Never Smoker   . Smokeless tobacco: Never Used  . Alcohol Use: No  . Drug Use: No  . Sexually Active:    Other Topics Concern  . Not on file   Social History Narrative  . No narrative on file    Family History  Problem Relation Age of Onset  . Hypertension Mother   . Diabetes Father   . Anesthesia problems Neg Hx     No current facility-administered medications on file prior to encounter.   Current Outpatient Prescriptions on File Prior to Encounter  Medication Sig Dispense Refill  . b complex-vitamin c-folic acid (NEPHRO-VITE) 0.8 MG TABS Take 0.8 mg by mouth at bedtime.        . cinacalcet (SENSIPAR) 60 MG tablet Take 120 mg by mouth daily. Takes 2 tablets at lunch      . diltiazem (CARDIZEM CD) 180 MG 24 hr capsule Take 180 mg by mouth daily.        .  hydrALAZINE (APRESOLINE) 50 MG tablet Take 50 mg by mouth 3 (three) times daily.        . insulin glargine (LANTUS) 100 UNIT/ML injection Inject 10-20 Units into the skin 2 (two) times daily. Patient takes 20units in the morning and 10 units at bedtime      . sevelamer (RENAGEL) 800 MG tablet Take 800 mg by mouth 2 (two) times daily with a meal.        No Known Allergies  Review of Systems: Kidney Disease, As listed above, otherwise negative.  Physical Examination  Filed Vitals:   08/24/11 0645  BP: 119/71  Pulse: 61  Temp: 97.7 F (36.5 C)  TempSrc: Oral  Resp: 18  SpO2: 98%   There is no height or weight on file to calculate BMI.  General: A&O x 3, WDWN  Pulmonary: Sym exp, good air movt, CTAB, no rales, rhonchi, & wheezing  Cardiac: RRR, Nl S1, S2, no Murmurs, rubs or gallops  Gastrointestinal: soft, NTND, -G/R, - HSM, - masses, - CVAT B  Musculoskeletal: M/S 5/5 throughout , Extremities without ischemic changes , palpable thrill, + bruit  Laboratory See [facility]  Medical Decision Making  [name] is a 66 y.o. male who presents with: end stage renal disease .   The patient is scheduled for: L 2nd stage BVT  Risk, benefits, and alternatives to access surgery were discussed.  The patient is aware the risks include but are not limited to: bleeding, infection, steal syndrome, nerve damage, ischemic monomelic neuropathy, failure to mature, and need for additional procedures.  The patient is aware of the risks and agrees to proceed.  [name], MD [facility] [place] Office: [phone] Pager: [phone]  08/24/2011, 7:24 AM

## 2011-08-24 NOTE — Anesthesia Preprocedure Evaluation (Signed)
Anesthesia Evaluation  Patient identified by MRN, date of birth, ID band Patient awake    Reviewed: Allergy & Precautions, H&P , NPO status , Patient's Chart, lab work & pertinent test results  Airway       Dental   Pulmonary          Cardiovascular hypertension, Pt. on medications     Neuro/Psych    GI/Hepatic   Endo/Other  Diabetes mellitus-, Well Controlled, Type 2, Oral Hypoglycemic Agents  Renal/GU      Musculoskeletal   Abdominal   Peds  Hematology   Anesthesia Other Findings   Reproductive/Obstetrics                           Anesthesia Physical Anesthesia Plan  ASA: III  Anesthesia Plan: General   Post-op Pain Management:    Induction: Intravenous  Airway Management Planned: LMA  Additional Equipment:   Intra-op Plan:   Post-operative Plan: Extubation in OR  Informed Consent: I have reviewed the patients History and Physical, chart, labs and discussed the procedure including the risks, benefits and alternatives for the proposed anesthesia with the patient or authorized representative who has indicated his/her understanding and acceptance.     Plan Discussed with: CRNA and Surgeon  Anesthesia Plan Comments:         Anesthesia Quick Evaluation

## 2011-08-25 LAB — POCT I-STAT 4, (NA,K, GLUC, HGB,HCT)
HCT: 31 % — ABNORMAL LOW (ref 39.0–52.0)
Sodium: 140 mEq/L (ref 135–145)

## 2011-08-26 ENCOUNTER — Ambulatory Visit (INDEPENDENT_AMBULATORY_CARE_PROVIDER_SITE_OTHER): Payer: Medicare Other | Admitting: Gastroenterology

## 2011-08-26 ENCOUNTER — Ambulatory Visit: Payer: Medicare Other | Admitting: Gastroenterology

## 2011-08-26 ENCOUNTER — Encounter: Payer: Self-pay | Admitting: Gastroenterology

## 2011-08-26 VITALS — BP 157/83 | HR 86 | Temp 98.2°F | Ht 70.0 in

## 2011-08-26 DIAGNOSIS — R197 Diarrhea, unspecified: Secondary | ICD-10-CM

## 2011-08-26 DIAGNOSIS — D369 Benign neoplasm, unspecified site: Secondary | ICD-10-CM

## 2011-08-26 MED ORDER — DICYCLOMINE HCL 10 MG PO CAPS: 10.0000 mg | ORAL_CAPSULE | Freq: Three times a day (TID) | ORAL | Status: DC

## 2011-08-26 NOTE — Progress Notes (Signed)
Primary Care Physician:  Avon Gully, MD, MD Primary Gastroenterologist:  Dr. Jena Gauss   Chief Complaint  Patient presents with  . Diarrhea  . Abdominal Pain    HPI:   66 year old male presenting at request of Dr. Felecia Shelling secondary to diarrhea for the past 5-6 months. Last TCS by Dr. Jena Gauss in 2007 secondary to anemia. Tubulovillous adenoma and adenomatous polyps noted. Oozing polyp treated with hot snare polypectomy, likely source of anemia.  Reports postprandial loose stools, watery. Has not paid attention to any evidence of rectal bleeding. Denies abdominal pain, N/V. No GERD, dysphagia. Reports good appetite. Had been started on Lomotil by PCP with slight improvement, then returned back to diarrhea. Denies use of abx, changes in medications. Dialysis M, W, F.    Past Medical History  Diagnosis Date  . Chronic kidney disease   . Hypertension   . Diabetes mellitus   . Peripheral vascular disease   . Obesity   . Renal disorder     Past Surgical History  Procedure Date  . Above knee leg amputation     Right AKA  . Av fistula placement   . Arteriovenous graft placement   . Pr vein bypass graft,aorto-fem-pop   . Insertion of dialysis catheter 07/30/2011    Procedure: INSERTION OF DIALYSIS CATHETER;  Surgeon: Larina Earthly, MD;  Location: [facility] OR;  Service: Vascular;  Laterality: Right;  removal of right dialysis catheter and exchange/ insertion of new right dialysis catheter  . Eye surgery   . Colonoscopy Dec 2007    RMR: normal rectum, colonic polyps with ascending colon polyp actively oozing, s/p hot snare polypectomy: tubulovillous adenoma, adenomatous polyps    Current Outpatient Prescriptions  Medication Sig Dispense Refill  . b complex-vitamin c-folic acid (NEPHRO-VITE) 0.8 MG TABS Take 0.8 mg by mouth at bedtime.        . cinacalcet (SENSIPAR) 60 MG tablet Take 120 mg by mouth daily. Takes 2 tablets at lunch      . diltiazem (CARDIZEM CD) 180 MG 24 hr capsule Take 180 mg by  mouth daily.        . hydrALAZINE (APRESOLINE) 50 MG tablet Take 50 mg by mouth 3 (three) times daily.        . insulin glargine (LANTUS) 100 UNIT/ML injection Inject 10-20 Units into the skin 2 (two) times daily. Patient takes 20units in the morning and 10 units at bedtime      . sevelamer (RENAGEL) 800 MG tablet Take 800 mg by mouth 2 (two) times daily with a meal.      . dicyclomine (BENTYL) 10 MG capsule Take 1 capsule (10 mg total) by mouth 4 (four) times daily -  before meals and at bedtime.  120 capsule  1   Current Facility-Administered Medications  Medication Dose Route Frequency Provider Last Rate Last Dose  . cefUROXime (ZINACEF) 1.5 g in dextrose 5 % 50 mL IVPB  1.5 g Intravenous Once Fransisco Hertz, MD        Allergies as of 08/26/2011  . (No Known Allergies)    Family History  Problem Relation Age of Onset  . Hypertension Mother   . Diabetes Father   . Anesthesia problems Neg Hx   . Colon cancer Neg Hx     History   Social History  . Marital Status: Single    Spouse Name: N/A    Number of Children: N/A  . Years of Education: N/A   Occupational History  . Not on  file.   Social History Main Topics  . Smoking status: Never Smoker   . Smokeless tobacco: Never Used  . Alcohol Use: No  . Drug Use: No  . Sexually Active:    Other Topics Concern  . Not on file   Social History Narrative  . No narrative on file    Review of Systems: Gen: Denies any fever, chills, fatigue, weight loss, lack of appetite.  CV: Denies chest pain, heart palpitations, peripheral edema, syncope.  Resp: Denies shortness of breath at rest or with exertion. Denies wheezing or cough.  GI: Denies dysphagia or odynophagia. Denies jaundice, hematemesis, fecal incontinence. GU : Denies urinary burning, urinary frequency, urinary hesitancy MS: Denies joint pain, muscle weakness, cramps, or limitation of movement.  Derm: Denies rash, itching, dry skin Psych: Denies depression, anxiety,  memory loss, and confusion Heme: Denies bruising, bleeding, and enlarged lymph nodes.  Physical Exam: BP 157/83  Pulse 86  Temp(Src) 98.2 F (36.8 C) (Temporal)  Ht 5\' 10"  (1.778 m) General:   Alert and oriented. Pleasant and cooperative. Well-nourished and well-developed. Wheelchair-bound, right AKA.  Head:  Normocephalic and atraumatic. Eyes:  Without icterus, sclera clear and conjunctiva pink.  Ears:  Normal auditory acuity. Nose:  No deformity, discharge,  or lesions. Mouth:  Poor dentition.  Neck:  Supple, without mass or thyromegaly. Lungs:  Clear to auscultation bilaterally. No wheezes, rales, or rhonchi. No distress.  Heart:  S1, S2 present without murmurs appreciated.  Abdomen:  +BS, soft, non-tender and non-distended. No HSM noted. No guarding or rebound. No masses appreciated. Limited as pt is in wheelchair.  Rectal:  Deferred  Msk:  Right AKA Extremities:  Left lower extremity with 1 + edema Neurologic:  Alert and  oriented x4;  grossly normal neurologically. Skin:  Intact without significant lesions or rashes. Cervical Nodes:  No significant cervical adenopathy. Psych:  Alert and cooperative. Normal mood and affect.

## 2011-08-26 NOTE — Patient Instructions (Signed)
Stop taking Lomotil.  Please complete stool samples.   You may start taking Bentyl before meals and at bedtime to help with loose stools after eating. Monitor for dry mouth, constipation.   Follow a high fiber diet. Please see the handout provided.    How to Increase Fiber in the Meal Plan for Diabetes Increasing fiber in the diet has many benefits including lowering blood cholesterol, helping to control blood glucose (sugar), preventing constipation, and aiding in weight management by helping you feel full longer. Start adding fiber to your diet slowly. A gradual substitution of high fiber foods for low fiber foods will allow the digestive tract to adjust. Most men under 53 years of age should aim to eat 38 g of fiber a day. Women should aim for 25 g. Over 4 years of age, most men need 30 g of fiber and most women need 21 g. Below are some suggestions for increasing fiber.  Try whole-wheat bread instead of white bread. Look for words high on the list of ingredients such as whole wheat, whole rye, or whole oats.   Try baked potato with skin instead of mashed potatoes.   Try a fresh apple with skin instead of applesauce.   Try a fresh orange instead of orange juice.   Try popcorn instead of potato chips.   Try bran cereal instead of corn flakes.   Try kidney, whole pinto, or garbanzo beans instead of bread.   Try whole-grain crackers instead of saltine crackers.   Try whole-wheat pasta instead of regular varieties.   While on a high fiber diet, drink enough water and fluids to keep your urine clear or pale yellow.   Eat a variety of high fiber foods such as fruits, vegetables, whole grains, nuts, and seeds.   Try to increase your intake of fiber by eating high fiber foods instead of taking fiber pills or supplements that contain small amounts of fiber. There can be additional benefits for long-term health and blood glucose control with high fiber foods. Aim for 5 servings of fruit  and vegetables per day.  SOURCES OF FIBER The following list shows the average dietary fiber for types of food in the various food groups. Starch and Bread / Dietary Fiber (g)  Whole-grain breads, 1 slice / 2 g   Whole grain,  cup / 2 g   Whole-grain cereals,  cup / 3 g   Bran cereals, ? to  cup / 8 g   Starchy vegetables,  cup / 3 g   Legumes (beans, peas, lentils),  cup / 8 g   Oatmeal,  cup / 2 g   Whole-wheat pasta, ? cup / 2 g   Brown rice,  cup / 2 g   Barley,  cup / 3 g  Meat and Meat Substitutes / Dietary Fiber (g) This group averages 0 grams of fiber. Exceptions are:  Nuts, seeds, 1 oz or  cup / 3 g   Chunky peanut butter, 2 tbs / 3 g  Vegetables / Dietary Fiber (g)  Cooked vegetables,  to  cup / 2 to 3 g   Raw vegetables, 1 to 2 cups / 2 to 3 g  Fruit / Dietary Fiber (g)  Raw or cooked fruit,  cup or 1 small, fresh piece / 2 g  Milk / Dietary Fiber (g)  Milk, 1 cup or 8 oz / 0 g  Fats and Oils / Dietary Fiber (g)  Fats and oils, 1 tsp /  0 g  You can determine how much fiber you are eating by reading the Nutrition Facts panel on the labels of the foods you eat. FIBER IN SPECIFIC FOODS Cereals / Dietary Fiber (g)  All Bran, ? cup / 9 g   All Bran with Extra Fiber,  cup / 13 g   Bran Flakes,  cup / 4 g   Cheerios,  cup / 1.5 g   Corn Bran,  cup / 4 g   Corn Flakes,  cup / 0.75 g   Cracklin' Oat Bran,  cup / 4 g   Fiber One,  cup / 13 g   Grape Nuts, 3 tbs / 3 g   Grape Nuts Flakes,  cup / 3 g   Noodles,  cup, cooked / 0.5 g   Nutrigrain Wheat,  cup / 3.5 g   Oatmeal,  cup, cooked / 1.1 g   Pasta, white (macaroni, spaghetti),  cup, cooked / 0.5 g   Pasta, whole-wheat (macaroni, spaghetti),  cup, cooked / 2 g   [place],  cup, cooked / 3 g   Rice, wild, ? cup, cooked / 0.5 g   Rice, brown,  cup, cooked / 1 g   Rice, white,  cup, cooked / 0.2 g   Shredded Wheat, bite-sized,  cup / 2 g     Total,  cup / 1.75 g   Wheat Chex,  cup / 2.5 g   Wheatena,  cup, cooked / 4 g   Wheaties,  cup / 2.75 g  Bread, Starchy Vegetables, and Dried Peas and Beans / Dietary Fiber (g)  Bagel, whole / 0.6 g   Baked beans in tomato sauce,  cup, cooked / 3 g   Bran muffin, 1 small / 2.5 g   Bread, cracked wheat, 1 slice / 2.5 g   Bread, pumpernickel, 1 slice / 2.5 g   Bread, white, 1 slice / 0.4 g   Bread, whole-wheat, 1 slice / 1.4 g   Corn,  cup, canned / 2.9 g   Kidney beans, ? cup, cooked / 3.5 g   Lentils, ? cup, cooked / 3 g   Lima beans,  cup, cooked / 4 g   Navy beans, ? cup, cooked / 4 g   Peas,  cup, cooked / 4 g   Popcorn, 3 cups popped, unbuttered / 3.5 g   Potato, baked (with skin), 1 small / 4 g   Potato, baked (without skin), 1 small / 2 g   Ry-Krisp, 4 crackers / 3 g   Saltine crackers, 6 squares / 0 g   Split peas, ? cup, cooked / 2.5 g   Yams (sweet potato), ? cup / 1.7 g  Fruit / Dietary Fiber (g)  Apple, 1 small, fresh, with skin / 4 g   Apple juice,  cup / 0.4 g   Apricots, 4 medium, fresh / 4 g   Apricots, 7 halves, dried / 2 g   Banana,  medium / 1.2 g   Blueberries,  cup / 2 g   Cantaloupe, ? melon / 1.3 g   Cherries,  cup, canned / 1.4 g   Grapefruit,  medium / 1.6 g   Grapes, 15 small / 1.2 g   Grape juice, ? cup / 0.5 g   Orange, 1 medium, fresh / 2 g   Orange juice,  cup / 0.5 g   Peach, 1 medium,fresh, with skin / 2 g   Pear,  1 medium, fresh, with skin / 4 g   Pineapple, ? cup, canned / 0.7 g   Plums, 2 whole / 2 g   Prunes, 3 whole / 1.5 g   Raspberries, 1 cup / 6 g   Strawberries, 1  cup / 4 g   Watermelon, 1  cup / 0.5 g  Vegetables / Dietary Fiber (g)  Asparagus,  cup, cooked / 1 g   Beans, green and wax,  cup, cooked / 1.6 g   Beets,  cup, cooked / 1.8 g   Broccoli,  cup, cooked / 2.2 g   Brussels sprouts,  cup, cooked / 4 g   Cabbage,  cup, cooked / 2.5 g    Carrots,  cup, cooked / 2.3 g   Cauliflower,  cup, cooked / 1.1 g   Celery, 1 cup, raw / 1.5 g   Cucumber, 1 cup, raw / 0.8 g   Green pepper,  cup sliced, cooked / 1.5 g   Lettuce, 1 cup, sliced / 0.9 g   Mushrooms, 1 cup sliced, raw / 1.8 g   Onion, 1 cup sliced, raw / 1.6 g   Spinach,  cup, cooked / 2.4 g   Tomato, 1 medium, fresh / 1.5 g   Tomato juice,  cup / 0 g   Zucchini,  cup, cooked / 1.8 g  Document Released: 10/16/2001 Document Revised: 04/15/2011 Document Reviewed: 11/12/2008 [facility] Patient Information 2012 [place], [place].

## 2011-08-27 ENCOUNTER — Encounter: Payer: Self-pay | Admitting: Gastroenterology

## 2011-08-27 DIAGNOSIS — R197 Diarrhea, unspecified: Secondary | ICD-10-CM | POA: Insufficient documentation

## 2011-08-28 DIAGNOSIS — D369 Benign neoplasm, unspecified site: Secondary | ICD-10-CM | POA: Insufficient documentation

## 2011-08-28 NOTE — Assessment & Plan Note (Signed)
Obtain stool studies, f/u to set up TCS if negative.

## 2011-08-28 NOTE — Assessment & Plan Note (Signed)
66 year old male with 5-6 month hx of watery, postprandial diarrhea. No rectal bleeding or abdominal pain. Hx of tubulovillous adenoma, adenomatous polyps in 2007. No recent abx, change in meds, exposure to sick contacts. Will obtain stool studies, add Bentyl, stop Lomotil for now. Follow high fiber diet. Likely will need surveillance TCS after review of stool studies.

## 2011-08-30 NOTE — Progress Notes (Signed)
Faxed to PCP

## 2011-08-31 NOTE — Progress Notes (Signed)
Has not completed Cdiff, Stool culture, Giardia, and Lactoferrin. Please have pt complete this ASAP. Will ultimately need TCS.

## 2011-08-31 NOTE — Progress Notes (Signed)
Mailed letter to pt

## 2011-09-02 ENCOUNTER — Encounter (HOSPITAL_COMMUNITY): Payer: Self-pay

## 2011-09-02 ENCOUNTER — Emergency Department (HOSPITAL_COMMUNITY)
Admission: EM | Admit: 2011-09-02 | Discharge: 2011-09-02 | Disposition: A | Discharge status: Home / Self Care | Payer: PRIVATE HEALTH INSURANCE | Attending: Emergency Medicine | Admitting: Emergency Medicine

## 2011-09-02 DIAGNOSIS — E119 Type 2 diabetes mellitus without complications: Secondary | ICD-10-CM | POA: Insufficient documentation

## 2011-09-02 DIAGNOSIS — IMO0002 Reserved for concepts with insufficient information to code with codable children: Secondary | ICD-10-CM | POA: Insufficient documentation

## 2011-09-02 DIAGNOSIS — I129 Hypertensive chronic kidney disease with stage 1 through stage 4 chronic kidney disease, or unspecified chronic kidney disease: Secondary | ICD-10-CM | POA: Insufficient documentation

## 2011-09-02 DIAGNOSIS — S78119A Complete traumatic amputation at level between unspecified hip and knee, initial encounter: Secondary | ICD-10-CM | POA: Insufficient documentation

## 2011-09-02 DIAGNOSIS — I739 Peripheral vascular disease, unspecified: Secondary | ICD-10-CM | POA: Insufficient documentation

## 2011-09-02 DIAGNOSIS — L0390 Cellulitis, unspecified: Secondary | ICD-10-CM

## 2011-09-02 DIAGNOSIS — N189 Chronic kidney disease, unspecified: Secondary | ICD-10-CM | POA: Insufficient documentation

## 2011-09-02 MED ORDER — SULFAMETHOXAZOLE-TMP DS 800-160 MG PO TABS
1.0000 | ORAL_TABLET | Freq: Once | ORAL | Status: AC
  Administered 2011-09-02: 1 via ORAL
  Filled 2011-09-02: qty 1

## 2011-09-02 MED ORDER — SULFAMETHOXAZOLE-TRIMETHOPRIM 800-160 MG PO TABS: 1.0000 | ORAL_TABLET | Freq: Two times a day (BID) | ORAL | Status: DC

## 2011-09-02 NOTE — ED Notes (Signed)
EMS called for transport back home 

## 2011-09-02 NOTE — ED Provider Notes (Signed)
History    This chart was scribed for Donnetta Hutching, MD, MD by [name]. The patient was seen in room APA10 and the patient's care was started at 7:49AM.   CSN: [id number]  Arrival date & time 09/02/11  0711   None     Chief Complaint  Patient presents with  . Illegal value: [    ? fistula infection per pt    (Consider location/radiation/quality/duration/timing/severity/associated sxs/prior treatment) The history is provided by the patient.   Julian [name] is a 66 y.o. male who presents to the Emergency Department complaining of fistula drainage onset today. Pt reports that it is sore in his left arm Pt reports the fistula placed in left upper arm on August 24 2011 Dr. Catha Gosselin in [address]. Dialysis is on MWF. Pt has had an infected fistula in the past and it was moved to another location. Pt denies fever and any other symptoms. Symptoms have been constant since onset without radiation.   Past Medical History  Diagnosis Date  . Chronic kidney disease   . Hypertension   . Diabetes mellitus   . Peripheral vascular disease   . Obesity   . Renal disorder     Past Surgical History  Procedure Date  . Above knee leg amputation     Right AKA  . Av fistula placement   . Arteriovenous graft placement   . Pr vein bypass graft,aorto-fem-pop   . Insertion of dialysis catheter 07/30/2011    Procedure: INSERTION OF DIALYSIS CATHETER;  Surgeon: Larina Earthly, MD;  Location: [facility] OR;  Service: Vascular;  Laterality: Right;  removal of right dialysis catheter and exchange/ insertion of new right dialysis catheter  . Eye surgery   . Colonoscopy Dec 2007    RMR: normal rectum, colonic polyps with ascending colon polyp actively oozing, s/p hot snare polypectomy: tubulovillous adenoma, adenomatous polyps    Family History  Problem Relation Age of Onset  . Hypertension Mother   . Diabetes Father   . Anesthesia problems Neg Hx   . Colon cancer Neg Hx     History  Substance Use Topics  .  Smoking status: Never Smoker   . Smokeless tobacco: Never Used  . Alcohol Use: No      Review of Systems  All other systems reviewed and are negative.   10 Systems reviewed and all are negative for acute change except as noted in the HPI.   Allergies  Review of patient's allergies indicates no known allergies.  Home Medications   Current Outpatient Rx  Name Route Sig Dispense Refill  . NEPHRO-VITE 0.8 MG PO TABS Oral Take 0.8 mg by mouth at bedtime.      Marland Kitchen CINACALCET HCL 60 MG PO TABS Oral Take 120 mg by mouth daily. Takes 2 tablets at lunch    . DICYCLOMINE HCL 10 MG PO CAPS Oral Take 1 capsule (10 mg total) by mouth 4 (four) times daily -  before meals and at bedtime. 120 capsule 1  . DILTIAZEM HCL ER COATED BEADS 180 MG PO CP24 Oral Take 180 mg by mouth daily.      Marland Kitchen HYDRALAZINE HCL 50 MG PO TABS Oral Take 50 mg by mouth 3 (three) times daily.      . INSULIN GLARGINE 100 UNIT/ML  SOLN Subcutaneous Inject 10-20 Units into the skin 2 (two) times daily. Patient takes 20units in the morning and 10 units at bedtime    . SEVELAMER HCL 800 MG PO TABS  Oral Take 800 mg by mouth 2 (two) times daily with a meal.      BP 163/62  Pulse 80  Temp(Src) 97.7 F (36.5 C) (Oral)  Resp 18  Ht 5\' 10"  (1.778 m)  SpO2 99%  Physical Exam  Nursing note and vitals reviewed. Constitutional: He is oriented to person, place, and time. He appears well-developed and well-nourished. No distress.  HENT:  Head: Normocephalic and atraumatic.  Eyes: Conjunctivae are normal. Pupils are equal, round, and reactive to light.  Neck: Normal range of motion. Neck supple.  Cardiovascular: Normal rate, regular rhythm and normal heart sounds.   Pulmonary/Chest: Effort normal and breath sounds normal. No respiratory distress.  Abdominal: Soft. He exhibits no distension.  Musculoskeletal:       Medial aspect of left upper arm incision 12 cm draining light pink light brown fluid.   Neurological: He is alert  and oriented to person, place, and time.  Skin: Skin is warm and dry.  Psychiatric: He has a normal mood and affect. His behavior is normal.    ED Course  Procedures (including critical care time) DIAGNOSTIC STUDIES: Oxygen Saturation is 99% on room air, normal by my interpretation.    COORDINATION OF CARE: 7:55AM EDP discusses pt ED treatment with pt.    Labs Reviewed - No data to display No results found.   No diagnosis found.    MDM  I discussed patient's situation with a vascular surgeon who did the fistula procedure.  He recommended prescribing an antibiotic and followup in the office.    I personally performed the services described in this documentation, which was scribed in my presence. The recorded information has been reviewed and considered.         Donnetta Hutching, MD 09/02/11 1051

## 2011-09-02 NOTE — ED Notes (Signed)
Awaiting ems

## 2011-09-02 NOTE — Discharge Instructions (Signed)
Antibiotic for 10 days. Take next dose this evening.  Registered nurse will give you followup appointment made with Dr.Shinn.  Keep wound clean and dry.  Call the vascular surgeon if you have fever or chills

## 2011-09-02 NOTE — ED Notes (Signed)
Pt states he had fistula placed to left upper arm on April 16th. +bruit present and thrill felt. C/o drainage present to left arm. Left upper arm red with scant amount of serous sanguinous drainage on drsg. Denies fever or chills.

## 2011-09-06 ENCOUNTER — Encounter: Payer: Self-pay | Admitting: Vascular Surgery

## 2011-09-07 ENCOUNTER — Ambulatory Visit (INDEPENDENT_AMBULATORY_CARE_PROVIDER_SITE_OTHER): Payer: PRIVATE HEALTH INSURANCE | Admitting: Thoracic Diseases

## 2011-09-07 ENCOUNTER — Encounter: Payer: Self-pay | Admitting: Gastroenterology

## 2011-09-07 VITALS — BP 143/57 | HR 80 | Temp 98.2°F | Ht 70.0 in | Wt 138.0 lb

## 2011-09-07 DIAGNOSIS — N186 End stage renal disease: Secondary | ICD-10-CM

## 2011-09-07 DIAGNOSIS — T82898A Other specified complication of vascular prosthetic devices, implants and grafts, initial encounter: Secondary | ICD-10-CM

## 2011-09-07 NOTE — Progress Notes (Signed)
[facility] [place]  Postoperative Visit hemodialysis access   Date of Surgery: 08/24/11 Surgeon: Loraine Maple, MD Nephrologist: Dr Allena Katz  HPI: OBRYAN RADU is a 66 y.o. male who is 2 weeks S/P creation/revision of left upper extremity Hemodialysis access. Pt was seen in [facility] ER on 09/02/11 for swelling and drainage from left BVT wound. He denied fever or chills. He was started on Bactrim BID.The patient denies symptoms of numbness, tingling, weakness and denies pain in the operative limb. Patient is here for post -op evaluation to assess healing and maturation of left revision BVT .  Pt is on hemodialysis through  right IJ catheter on M,W,F.  Physical Examination  Filed Vitals:   09/07/11 1406  BP: 143/57  Pulse: 80  Temp: 98.2 F (36.8 C)    WDWN male in NAD.  left upper extremity Incision is healed Skin color is normal   Hand grip is 5/5 and sensation in digits is intact; There is a good thrill and good bruit in the BVT. The graft/fistula is easily palpable and of adequate size  Assessment/Plan ZIA KANNER is a 66 y.o. year old who is s/p creation/revision of left upper extremity Hemodialysis access. No signs of infection Pt to finish Bactrim Follow-up in 3 weeks with Dr. Imogene Burn  Clinic MD: Betti Cruz, MD

## 2011-09-07 NOTE — Progress Notes (Signed)
I called pt to follow-up on stool studies.  He did not perform as this had resolved. Taking Bentyl now. Due for surveillance TCS, hx of adenomatous polyps.  Seen 4/18, so we should be ok with setting up a procedure without need for return OV.  On dialysis M, W, F. Let's have TCS on a Tues or Thursday, this way he can do dialysis the next day.  1/2 dose Lantus the evening prior.  TCS with RMR

## 2011-09-09 NOTE — Progress Notes (Signed)
Tried to call pt at both #s- no answer and could not leave a message

## 2011-09-10 ENCOUNTER — Encounter (HOSPITAL_COMMUNITY): Payer: Self-pay | Admitting: *Deleted

## 2011-09-10 ENCOUNTER — Emergency Department (HOSPITAL_COMMUNITY)
Admission: EM | Admit: 2011-09-10 | Discharge: 2011-09-10 | Disposition: A | Discharge status: Home / Self Care | Payer: PRIVATE HEALTH INSURANCE | Attending: Emergency Medicine | Admitting: Emergency Medicine

## 2011-09-10 ENCOUNTER — Other Ambulatory Visit: Payer: Self-pay | Admitting: *Deleted

## 2011-09-10 DIAGNOSIS — R609 Edema, unspecified: Secondary | ICD-10-CM | POA: Insufficient documentation

## 2011-09-10 DIAGNOSIS — E119 Type 2 diabetes mellitus without complications: Secondary | ICD-10-CM | POA: Insufficient documentation

## 2011-09-10 DIAGNOSIS — IMO0001 Reserved for inherently not codable concepts without codable children: Secondary | ICD-10-CM

## 2011-09-10 DIAGNOSIS — T8131XA Disruption of external operation (surgical) wound, not elsewhere classified, initial encounter: Secondary | ICD-10-CM | POA: Insufficient documentation

## 2011-09-10 DIAGNOSIS — T82598A Other mechanical complication of other cardiac and vascular devices and implants, initial encounter: Secondary | ICD-10-CM | POA: Insufficient documentation

## 2011-09-10 DIAGNOSIS — Y841 Kidney dialysis as the cause of abnormal reaction of the patient, or of later complication, without mention of misadventure at the time of the procedure: Secondary | ICD-10-CM | POA: Insufficient documentation

## 2011-09-10 DIAGNOSIS — I120 Hypertensive chronic kidney disease with stage 5 chronic kidney disease or end stage renal disease: Secondary | ICD-10-CM | POA: Insufficient documentation

## 2011-09-10 DIAGNOSIS — Z992 Dependence on renal dialysis: Secondary | ICD-10-CM | POA: Insufficient documentation

## 2011-09-10 DIAGNOSIS — Z794 Long term (current) use of insulin: Secondary | ICD-10-CM | POA: Insufficient documentation

## 2011-09-10 DIAGNOSIS — T8130XA Disruption of wound, unspecified, initial encounter: Secondary | ICD-10-CM

## 2011-09-10 DIAGNOSIS — S78119A Complete traumatic amputation at level between unspecified hip and knee, initial encounter: Secondary | ICD-10-CM | POA: Insufficient documentation

## 2011-09-10 DIAGNOSIS — N186 End stage renal disease: Secondary | ICD-10-CM

## 2011-09-10 DIAGNOSIS — M79609 Pain in unspecified limb: Secondary | ICD-10-CM | POA: Insufficient documentation

## 2011-09-10 DIAGNOSIS — IMO0002 Reserved for concepts with insufficient information to code with codable children: Secondary | ICD-10-CM | POA: Insufficient documentation

## 2011-09-10 DIAGNOSIS — I739 Peripheral vascular disease, unspecified: Secondary | ICD-10-CM | POA: Insufficient documentation

## 2011-09-10 DIAGNOSIS — L0390 Cellulitis, unspecified: Secondary | ICD-10-CM

## 2011-09-10 DIAGNOSIS — Z79899 Other long term (current) drug therapy: Secondary | ICD-10-CM | POA: Insufficient documentation

## 2011-09-10 LAB — DIFFERENTIAL
Lymphocytes Relative: 17 % (ref 12–46)
Lymphs Abs: 1.4 10*3/uL (ref 0.7–4.0)
Monocytes Relative: 10 % (ref 3–12)
Neutro Abs: 5.2 10*3/uL (ref 1.7–7.7)
Neutrophils Relative %: 66 % (ref 43–77)

## 2011-09-10 LAB — BASIC METABOLIC PANEL
GFR calc Af Amer: 12 mL/min — ABNORMAL LOW (ref >90)
GFR calc non Af Amer: 11 mL/min — ABNORMAL LOW (ref >90)
Glucose, Bld: 138 mg/dL — ABNORMAL HIGH (ref 70–99)
Potassium: 5 mEq/L (ref 3.5–5.1)
Sodium: 137 mEq/L (ref 135–145)

## 2011-09-10 LAB — CBC
Hemoglobin: 10.7 g/dL — ABNORMAL LOW (ref 13.0–17.0)
Platelets: 166 10*3/uL (ref 150–400)
RBC: 3.4 MIL/uL — ABNORMAL LOW (ref 4.22–5.81)
WBC: 7.9 10*3/uL (ref 4.0–10.5)

## 2011-09-10 MED ORDER — DEXTROSE 5 % IV SOLN
1.5000 g | INTRAVENOUS | Status: AC
  Administered 2011-09-10: 1.5 g via INTRAVENOUS
  Filled 2011-09-10: qty 1.5

## 2011-09-10 NOTE — Consult Note (Signed)
[facility] [place] Postoperative Visit hemodialysis access   Date of Surgery: 08/24/11 Surgeon: Imogene Burn HD:  yes HD:  Days:  M/W/F   CC: opening and drainage from BVT surgery  HPI:  This is a 66 y.o. male who returns today s/p 2nd stage left BVT on 08/24/11.  He states that his incision opened up a couple of days ago and was draining.  He was at dialysis this am and states that his wound opened up more.  He denies any medical changes since his BVT on 08/24/11.  He has not had any fever or chills, SOB, N/V.  PHYSICAL EXAMINATION:  Filed Vitals:   09/10/11 1401  BP: 127/61  Pulse: 82  Temp: 98.1 F (36.7 C)  Resp: 18     Incision is dehisced and is ~ 1 cm deep but tracts upward; there is some erythema around the distal portion of the incision.  Serous drainage. Hand grip is equal bilaterally and sensation in digits is intact; There is  Thrill; there is bruit. The graft/fistula is easily palpable  CV:  RRR Lungs:  CTAB Abdomen:  Soft NT/ND  ASSESSMENT/PLAN:  Julian [name] is a 66 y.o. year old male who presents s/p left BVT 08/24/11. He has some dehiscence of his incision. -a Cx of the wound was done by the ER MD. -a wet to dry dressing is placed.  He will need to continue wet to dry dressings.  Will have our office set up home health for bid wet to dry dressing changes. -Will start on 2 weeks of Keflex 500 mg po tid.  -Will have pt come back to see Dr. Imogene Burn weekly for wound checks. -he knows to contact our office if he begins to have fever/chills or purulent drainage from his wound.  Doreatha Massed, PA-C [facility] [phone]  Addendum  I have independently interviewed and examined the patient, and I agree with the physician assistant's findings.  This patient has a seroma in his right arm that has spontaneous started draining.  There is no evidence the BVT is involved with this tract.  There is some erythema in the arm.  We  discussed our options for management and the patient agrees to attempt a trial of local wound care and antibiotics.  He will be following up with [place] in the office weekly for wound checks and home nursing is being set up for wound care to this arm.  I am not surprised that this complication developed due to the severe obesity present in this patient left arm and the relative poor perfusion of that fatty tissue.     [name], MD [facility] [place] Office: [phone] Pager: [phone]  09/10/2011, 3:03 PM

## 2011-09-10 NOTE — ED Notes (Signed)
Vascular consult at bedside

## 2011-09-10 NOTE — Discharge Instructions (Signed)
Take antibiotics as directed by Dr. Nicky Pugh PA-C. His office will be calling out to set up follow up appointment and dressing changes. If you have not heard from the office by Monday then call for appointment times. Monitor arm for increasing redness, swelling or drainage and monitor for fevers. Return to ER for changing or worsening of symptoms.   Cellulitis Cellulitis is an infection of the tissue under the skin. The infected area is usually red and tender. This is caused by germs. These germs enter the body through cuts or sores. This usually happens in the arms or lower legs. HOME CARE   Take your medicine as told. Finish it even if you start to feel better.   If the infection is on the arm or leg, keep it raised (elevated).   Use a warm cloth on the infected area several times a day.   See your doctor for a follow-up visit as told.  GET HELP RIGHT AWAY IF:   You are tired or confused.   You throw up (vomit).   You have watery poop (diarrhea).   You feel ill and have muscle aches.   You have a fever.  MAKE SURE YOU:   Understand these instructions.   Will watch your condition.   Will get help right away if you are not doing well or get worse.  Document Released: 10/13/2007 Document Revised: 04/15/2011 Document Reviewed: 03/28/2009 [facility] Patient Information 2012 [place], [place].

## 2011-09-10 NOTE — ED Notes (Signed)
Pt states fistula site began "leaking" yesterday. Began again this morning. Pt states he went to dialysis and wound became larger. App 3 cm opening to fistula site in left upper arm with small amt of purulent drainage. Pt reports fistula was placed 08/24/11

## 2011-09-10 NOTE — ED Notes (Signed)
Pt discharged with PTAR home. Pt denies further questions or needs after speaking with EDP. Pt in NAD at this time

## 2011-09-10 NOTE — ED Provider Notes (Signed)
History     CSN: [id number]  Arrival date & time 09/10/11  0941   First MD Initiated Contact with Patient 09/10/11 [phone]      Chief Complaint  Patient presents with  . Vascular Access Problem    dehisence of AV fistula site in left upper arm    (Consider location/radiation/quality/duration/timing/severity/associated sxs/prior treatment) HPI Patient with ESRD who had eft second stage basilic vein transposition (brachiobasilic arteriovenous fistula) placement and arterial anastomosis revision by Dr. Imogene Burn on 08/24/11 presents to ER complaining of "fistula opening up and draining." patient complains that he had some pain in his LUE near site of fistula yesterday and noticed that part of suture line had opened up and was draining "bloody liquid." patient states he bandaged it yesterday and drainage and pain resolved however he woke last night with sheet wet from drainage. Patient states he went on to dialysis today and received 3 hours of his normal 4.5 hour dialysis but they stopped it to send him to ER for further eluviation of draining dehisced wound. Patient denies currently pain. States he had dialysis from port today. He denies fevers, chills, CP, SOB, redness or swelling of LUE. Denies aggravating or alleviating factors.    Past Medical History  Diagnosis Date  . Chronic kidney disease   . Hypertension   . Diabetes mellitus   . Peripheral vascular disease   . Obesity   . Renal disorder     Past Surgical History  Procedure Date  . Above knee leg amputation     Right AKA  . Av fistula placement   . Arteriovenous graft placement   . Pr vein bypass graft,aorto-fem-pop   . Insertion of dialysis catheter 07/30/2011    Procedure: INSERTION OF DIALYSIS CATHETER;  Surgeon: Larina Earthly, MD;  Location: [facility] OR;  Service: Vascular;  Laterality: Right;  removal of right dialysis catheter and exchange/ insertion of new right dialysis catheter  . Eye surgery   . Colonoscopy Dec 2007    RMR:  normal rectum, colonic polyps with ascending colon polyp actively oozing, s/p hot snare polypectomy: tubulovillous adenoma, adenomatous polyps     Family History  Problem Relation Age of Onset  . Hypertension Mother   . Diabetes Father   . Anesthesia problems Neg Hx   . Colon cancer Neg Hx     History  Substance Use Topics  . Smoking status: Never Smoker   . Smokeless tobacco: Never Used  . Alcohol Use: No      Review of Systems  All other systems reviewed and are negative.    Allergies  Review of patient's allergies indicates no known allergies.  Home Medications   Current Outpatient Rx  Name Route Sig Dispense Refill  . NEPHRO-VITE 0.8 MG PO TABS Oral Take 0.8 mg by mouth at bedtime.      Marland Kitchen CINACALCET HCL 60 MG PO TABS Oral Take 120 mg by mouth daily. Takes 2 tablets at lunch    . DICYCLOMINE HCL 10 MG PO CAPS Oral Take 1 capsule (10 mg total) by mouth 4 (four) times daily -  before meals and at bedtime. 120 capsule 1  . DILTIAZEM HCL ER COATED BEADS 180 MG PO CP24 Oral Take 180 mg by mouth daily.      Marland Kitchen DIPHENOXYLATE-ATROPINE 2.5-0.025 MG PO TABS Oral Take 2 tablets by mouth 4 (four) times daily as needed. diarrhea    . HYDRALAZINE HCL 50 MG PO TABS Oral Take 50 mg by mouth 3 (  three) times daily.      Marland Kitchen HYDROCODONE-ACETAMINOPHEN 5-500 MG PO TABS  1 tablet as needed.     . INSULIN GLARGINE 100 UNIT/ML [place] SOLN Subcutaneous Inject 10-20 Units into the skin 2 (two) times daily. Patient takes 20units in the morning and 10 units at bedtime    . NAPROXEN 500 MG PO TABS Oral Take 500 mg by mouth 2 (two) times daily.     . OXYCODONE HCL 5 MG PO TABS Oral Take 5 mg by mouth every 4 (four) hours as needed. pain    . RENVELA 800 MG PO TABS      . SEVELAMER HCL 800 MG PO TABS Oral Take 800 mg by mouth 2 (two) times daily with a meal.    . SULFAMETHOXAZOLE-TMP DS 800-160 MG PO TABS Oral Take by mouth 2 (two) times daily. Pt states he does not know the strength    .  SULFAMETHOXAZOLE-TRIMETHOPRIM 800-160 MG PO TABS Oral Take 1 tablet by mouth 2 (two) times daily. 20 tablet 0    BP 132/50  Pulse 81  Temp(Src) 97.7 F (36.5 C) (Oral)  Resp 22  SpO2 98%  Physical Exam  Nursing note and vitals reviewed. Constitutional: He is oriented to person, place, and time. He appears well-developed and well-nourished. No distress.  HENT:  Head: Normocephalic and atraumatic.  Eyes: Conjunctivae are normal.  Neck: Normal range of motion. Neck supple.  Cardiovascular: Normal rate, regular rhythm, normal heart sounds and intact distal pulses.  Exam reveals no gallop and no friction rub.   No murmur heard. Pulmonary/Chest: Effort normal and breath sounds normal. No respiratory distress. He has no wheezes. He has no rales. He exhibits no tenderness.  Abdominal: Soft. Bowel sounds are normal. He exhibits no distension and no mass. There is no tenderness. There is no rebound and no guarding.  Musculoskeletal: Normal range of motion. He exhibits edema. He exhibits no tenderness.       Linear suture line of LUE, inner upper arm with a 1cm area of dehiscence of distal suture line with faint surrounding erythema and warm. serosanguinous discharge. No purulent drainage or TTP. palpable thrill of fistula.   Neurological: He is alert and oriented to person, place, and time.  Skin: Skin is warm and dry. No rash noted. He is not diaphoretic. No erythema.  Psychiatric: He has a normal mood and affect.    ED Course  Procedures (including critical care time)  Labs Reviewed  BASIC METABOLIC PANEL - Abnormal; Notable for the following:    Glucose, Bld 138 (*)    Creatinine, Ser 5.20 (*)    GFR calc non Af Amer 11 (*)    GFR calc Af Amer 12 (*)    All other components within normal limits  CBC - Abnormal; Notable for the following:    RBC 3.40 (*)    Hemoglobin 10.7 (*)    HCT 33.9 (*)    All other components within normal limits  DIFFERENTIAL - Abnormal; Notable for the  following:    Eosinophils Relative 6 (*)    All other components within normal limits  WOUND CULTURE   No results found.   1. End stage renal disease   2. Wound dehiscence   3. Cellulitis     vasc surgery PA [place] to bedside and discussed case with Dr. Imogene Burn. She is setting up wound care with their office and starting patient on Keflex.   MDM  Home health for dressing changes established  by vasc surg with close follow up with abx directed by them. Afebrile. NAD. Agreeable to plan.         Jenness Corner, [place] 09/10/11 1459

## 2011-09-10 NOTE — ED Notes (Signed)
Pt arrives via ems. Was receiving dialysis and site dehisced. Pt reports receiving 3 hours of dialysis prior to event. Pt is MWF dialysis center is [facility] in [place]. Pt reports fistula was placed 08/24/11. Pt alert and oriented in NAD

## 2011-09-10 NOTE — ED Notes (Signed)
Placed call to [facility] for transport back home 

## 2011-09-12 LAB — WOUND CULTURE

## 2011-09-13 ENCOUNTER — Telehealth: Payer: Self-pay | Admitting: Vascular Surgery

## 2011-09-13 NOTE — Telephone Encounter (Addendum)
Message copied by Rosalyn Charters on Mon Sep 13, 2011 10:13 AM ------      Message from: [place], [place] K      Created: Mon Sep 13, 2011  8:25 AM                   ----- Message -----         From: Dara Lords, PA         Sent: 09/10/2011   2:54 PM           To: Sharee Pimple, CMA            Pt needs to come see Dr. Imogene Burn next Friday for wound check and weekly after that.  He has wound dehiscence of his left BVT.  The pt has HD m/w/f, but can come after 11am.            Thanks!            Samantha  NO ANSWER NO ANSWERING MACHINE - NO VOICEMAIL ON MOBILE # 09-13-11 10:21AM BAR

## 2011-09-13 NOTE — ED Provider Notes (Signed)
Medical screening examination/treatment/procedure(s) were conducted as a shared visit with non-physician practitioner(s) and myself.  I personally evaluated the patient during the encounter Pt with wound separation over a AV graft put in on August 24, 2011.  Has slight yellow drainage.  Wound cultured.  [facility] PA's saw pt, advised him to take Keflex, have office followup.  Carleene Cooper III, MD 09/13/11 [phone]

## 2011-09-13 NOTE — ED Notes (Addendum)
+   Wound Patient treated with bactrim-sensitive to same-chart appended  Per protocol MD.

## 2011-09-14 ENCOUNTER — Telehealth: Payer: Self-pay | Admitting: Vascular Surgery

## 2011-09-14 NOTE — Telephone Encounter (Signed)
-----   Message from Sheilah Pigeon sent at 09/14/2011 11:56 AM -----  Patient Aunt, Perley Jain, call concerned about the home health nurse only coming once a day to change dressing on wound. Says the wound doesn't look good and wants to see if he can be admitted [facility].       Triage nurse called pt's aunt in response to above phone call.  Aunt stated pt. Has a home health nurse that changes bandage on left arm once/day.  Also, stated pt. Has a home health aide through another agency that provides assistance with personal care and shopping. Stated that pt. doesn't have anyone in family that will assist him with dressing changes.  Aunt voiced concern that wound care is only done once/day instead of twice /day, as was ordered.  Aunt questioned if pt. could be admitted to hosp. for the proper wound care.  Aunt voiced concern that wound will get infected.  Triage nurse called [facility]/spoke with "Lucky", the supervisor.  Stated that pt's home health aide is arranged through a private company, [employer], and the nurses aide is not authorized, in her job description, to provide wound care.  Lucky stated that pt's PCP has ordered to pack wound with Iodoform packing, cover with foam, and then gauze bandage over foam.  States this was recommended per their Wound Care/ Ostomy nurse, requiring only daily wound care.  Advised Lucky that pt. has f/u w/ Dr. Imogene Burn on 09/17/11, and will readdress pt's wound care at that appt.

## 2011-09-15 NOTE — ED Notes (Signed)
No significant bacterial growth on culture per [facility]

## 2011-09-16 ENCOUNTER — Encounter: Payer: Self-pay | Admitting: Vascular Surgery

## 2011-09-17 ENCOUNTER — Ambulatory Visit (INDEPENDENT_AMBULATORY_CARE_PROVIDER_SITE_OTHER): Payer: PRIVATE HEALTH INSURANCE | Admitting: Vascular Surgery

## 2011-09-17 ENCOUNTER — Encounter: Payer: Self-pay | Admitting: Vascular Surgery

## 2011-09-17 ENCOUNTER — Ambulatory Visit: Payer: PRIVATE HEALTH INSURANCE | Admitting: Vascular Surgery

## 2011-09-17 VITALS — BP 122/74 | HR 91 | Temp 97.9°F | Resp 16 | Ht 70.0 in | Wt 145.5 lb

## 2011-09-17 DIAGNOSIS — T82898A Other specified complication of vascular prosthetic devices, implants and grafts, initial encounter: Secondary | ICD-10-CM

## 2011-09-17 DIAGNOSIS — N186 End stage renal disease: Secondary | ICD-10-CM

## 2011-09-17 NOTE — Progress Notes (Signed)
[facility] [place]  Postoperative Access Visit  History of Present Illness  Julian [name] is a 66 y.o. 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  The patient's postop course has been complicated by spontaneous drainage of serous fluid from left arm.  He was seen in ER and found to have a spontaneously draininge seroma and placed on antibiotics.  He is getting home health wound care.  The patient notes no steal symptoms.  The patient is able to complete their activities of daily living.  The patient's current symptoms are: none.  Physical Examination  Filed Vitals:   09/17/11 1222  BP: 122/74  Pulse: 91  Temp: 97.9 F (36.6 C)  Resp: 16   LUE: Incision is healed except skin opening, subcutaneous cavity is smaller than previous, skin feels warm, hand grip is 5/5, sensation in digits is intact, palpable thrill, bruit can be auscultated but has a wheezing sound  Medical Decision Making  Julian [name] is a 66 y.o. 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   Given the obesity of this patient, I am not surprised that this patient had a wound complication.  The vein was harvested from nearly 2-3 inches of fat, which has poor vascularization, which equates to poor healing.   Continue wet-to-dry dressing to the left upper arm.  BVT cannot be used until the wound resolves.  Based on the sound of the bruit, I have concerns with the long term patency of this BVT.  This was already revised due to vein concerns.  The artery was diseased also, so if this BVT fails, ultimately, it's likely due to inflow problems so continued procedures in this left arm arm are likely to have similar poor patencies.  Thank you for allowing [place] to participate in this patient's care.  [name], MD [facility] [place] Office: [phone] Pager: [phone]

## 2011-09-24 ENCOUNTER — Ambulatory Visit: Payer: Medicare Other | Admitting: Vascular Surgery

## 2011-09-30 ENCOUNTER — Encounter: Payer: Self-pay | Admitting: Vascular Surgery

## 2011-10-01 ENCOUNTER — Encounter: Payer: Self-pay | Admitting: Vascular Surgery

## 2011-10-01 ENCOUNTER — Ambulatory Visit (INDEPENDENT_AMBULATORY_CARE_PROVIDER_SITE_OTHER): Payer: PRIVATE HEALTH INSURANCE | Admitting: Vascular Surgery

## 2011-10-01 VITALS — BP 121/56 | HR 71 | Resp 20 | Ht 70.0 in | Wt 254.2 lb

## 2011-10-01 DIAGNOSIS — N186 End stage renal disease: Secondary | ICD-10-CM

## 2011-10-01 NOTE — Progress Notes (Signed)
[facility] [place]   Postoperative Access Visit  History of Present Illness  Julian [name] is a 66 y.o. 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 The drainage from the left arm is improved.  The patient notes no fever or chills.   Physical Examination   LUE: Incision is healed except skin opening, subcutaneous cavity is significant smaller than previous on probing, skin feels warm, hand grip is 5/5, sensation in digits is intact, palpable thrill, bruit can be auscultated with improved character from previously  Medical Decision Making  Julian [name] is a 66 y.o. 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   Change dressing to calcium alginate rope daily I suspect this wound should be healed in the next 2-4 weeks. He will follow up for re-evaluation in 2 weeks. Thank you for allowing [place] to participate in this patient's care.  [name], MD [facility] [place] Office: [phone] Pager: [phone]  10/01/2011, 12:51 PM

## 2011-10-14 ENCOUNTER — Encounter: Payer: Self-pay | Admitting: Neurosurgery

## 2011-10-14 ENCOUNTER — Ambulatory Visit: Payer: PRIVATE HEALTH INSURANCE | Admitting: Family Medicine

## 2011-10-15 ENCOUNTER — Encounter: Payer: Self-pay | Admitting: Neurosurgery

## 2011-10-15 ENCOUNTER — Ambulatory Visit: Payer: PRIVATE HEALTH INSURANCE | Admitting: Vascular Surgery

## 2011-10-15 ENCOUNTER — Ambulatory Visit (INDEPENDENT_AMBULATORY_CARE_PROVIDER_SITE_OTHER): Payer: PRIVATE HEALTH INSURANCE | Admitting: Neurosurgery

## 2011-10-15 VITALS — BP 95/60 | HR 60 | Resp 14 | Ht 70.0 in | Wt 254.0 lb

## 2011-10-15 DIAGNOSIS — N186 End stage renal disease: Secondary | ICD-10-CM

## 2011-10-15 DIAGNOSIS — T82898A Other specified complication of vascular prosthetic devices, implants and grafts, initial encounter: Secondary | ICD-10-CM

## 2011-10-15 NOTE — Progress Notes (Signed)
Subjective:     Patient ID: Julian [name], male   DOB: 1946/04/17, 66 y.o.   MRN: [id number]  HPI: 66 year old male patient of Dr. Imogene Burn who is followed for status post left seconds date DVT in April 2013. Patient had a slight open area and incision line and is followed weekly for wound check. Patient reports no signs of steal in that left hand. There is no drainage, and the wound appears to be healing well.   Review of Systems: 12 point review of systems is notable for the difficulties described above otherwise unremarkable     Objective:   Physical Exam: Vital signs are stable patient is afebrile, wound is healing well there is a very small area that is beginning to scab over with only pinpoint drainage on the old bandage. There is no redness along the suture line and otherwise is well healed. Patient has a audible bruit or palpable thrill. Grip strength is 5 of 5 in the left hand.     Assessment:     Assessment as above    Plan:     The patient will return in one week for wound check, his questions were encouraged and answered, he is in agreement with this plan.     Lauree Chandler ANP  Clinic M.D.: Imogene Burn

## 2011-10-21 ENCOUNTER — Encounter: Payer: Self-pay | Admitting: Neurosurgery

## 2011-10-21 ENCOUNTER — Ambulatory Visit: Payer: PRIVATE HEALTH INSURANCE | Admitting: Family Medicine

## 2011-10-22 ENCOUNTER — Encounter: Payer: Self-pay | Admitting: Neurosurgery

## 2011-10-22 ENCOUNTER — Ambulatory Visit: Payer: PRIVATE HEALTH INSURANCE | Admitting: Vascular Surgery

## 2011-10-22 ENCOUNTER — Ambulatory Visit (INDEPENDENT_AMBULATORY_CARE_PROVIDER_SITE_OTHER): Payer: PRIVATE HEALTH INSURANCE | Admitting: Neurosurgery

## 2011-10-22 ENCOUNTER — Ambulatory Visit: Payer: PRIVATE HEALTH INSURANCE | Admitting: Neurosurgery

## 2011-10-22 VITALS — BP 124/80 | HR 84 | Temp 97.2°F | Resp 16 | Ht 70.0 in | Wt 254.0 lb

## 2011-10-22 DIAGNOSIS — N186 End stage renal disease: Secondary | ICD-10-CM

## 2011-10-22 NOTE — Progress Notes (Signed)
Subjective:     Patient ID: Julian [name], male   DOB: [birth year], 66 y.o.   MRN: [id number]  HPI: 66 year old male patient of Dr. Imogene Burn followed status post second stage BVT 08/24/2011. The patient seen for wound check along the suture line which is healed well. Patient has no complaints today.   Review of Systems: 12 point review of systems is notable for the difficulties described above otherwise unremarkable     Objective:   Physical Exam: Afebrile,VSS, Dr. Imogene Burn examined the wound as well as I, this is well-healed, there is no redness no drainage.     Assessment:     66 year old male patient with end-stage renal disease currently on hemodialysis with new left upper extremity access    Plan:     Per Dr. Imogene Burn, the patient may followup-year-old when necessary basis, the patient was given a note to give to hemodialysis allowing them to access the left upper extremity. The patient's questions were encouraged and answered.  Lauree Chandler ANP  Clinic M.D.: Imogene Burn

## 2011-10-28 ENCOUNTER — Ambulatory Visit (INDEPENDENT_AMBULATORY_CARE_PROVIDER_SITE_OTHER): Payer: PRIVATE HEALTH INSURANCE | Admitting: Family Medicine

## 2011-10-28 ENCOUNTER — Encounter: Payer: Self-pay | Admitting: Family Medicine

## 2011-10-28 VITALS — BP 172/80 | HR 82 | Resp 18 | Ht 70.0 in | Wt 269.1 lb

## 2011-10-28 DIAGNOSIS — I10 Essential (primary) hypertension: Secondary | ICD-10-CM

## 2011-10-28 DIAGNOSIS — R197 Diarrhea, unspecified: Secondary | ICD-10-CM

## 2011-10-28 DIAGNOSIS — E119 Type 2 diabetes mellitus without complications: Secondary | ICD-10-CM

## 2011-10-28 DIAGNOSIS — R262 Difficulty in walking, not elsewhere classified: Secondary | ICD-10-CM

## 2011-10-28 DIAGNOSIS — E669 Obesity, unspecified: Secondary | ICD-10-CM

## 2011-10-28 DIAGNOSIS — Z992 Dependence on renal dialysis: Secondary | ICD-10-CM

## 2011-10-28 DIAGNOSIS — N186 End stage renal disease: Secondary | ICD-10-CM

## 2011-10-28 DIAGNOSIS — E118 Type 2 diabetes mellitus with unspecified complications: Secondary | ICD-10-CM | POA: Insufficient documentation

## 2011-10-28 NOTE — Patient Instructions (Signed)
Continue your current medications I will get the records from your kidney doctor and Dr.Fanta Bring your meter next time to the visit F/U 3 months

## 2011-10-28 NOTE — Assessment & Plan Note (Signed)
Review colonoscopy chronic diarrhea on Lomotil and Bentyl as needed

## 2011-10-28 NOTE — Assessment & Plan Note (Signed)
Resides in wheelchair secondary to above the knee amputation on right side. He has motorized wheelchair

## 2011-10-28 NOTE — Assessment & Plan Note (Signed)
Continue Lantus will obtain recent labs and titrate insulin

## 2011-10-28 NOTE — Assessment & Plan Note (Signed)
I will get records from his nephrologist as well as his recent labs. We'll continue dialysis as previously scheduled

## 2011-10-28 NOTE — Progress Notes (Signed)
  Subjective:    Patient ID: Julian [name], male    DOB: May 20, 1945, 66 y.o.   MRN: [id number]  HPI Patient here to establish care. Previous primary care provider Dr. Felecia Shelling. Nephrologist Dr. Guy Sandifer Surgeon-Dr.Chen Medications and history reviewed. Patient was unable to give specific details. Chart use for history RA documented He has no concerns today Diabetes mellitus currently on Lantus 20 units in the morning 10 units at night his blood sugars run 95-135 fasting. 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 This was performed by Dr. Lovell Sheehan He had a recent seroma of his left upper extremity s/p vein harvest to graft. This was drained by Dr. Claudie Fisherman in [facility]. This is also the side of his AV graft. He does have a permanent catheter in as well ESRD MWF  Review of Systems  GEN- denies fatigue, fever, weight loss,weakness, recent illness HEENT- denies eye drainage, change in vision, nasal discharge, CVS- denies chest pain, palpitations RESP- denies SOB, cough, wheeze ABD- denies N/V, + Chronic diarrhea , abd pain GU- denies dysuria, hematuria, dribbling, incontinence MSK- + joint pain, muscle aches, injury Neuro- denies headache, dizziness, syncope, seizure activity      Objective:   Physical Exam GEN- NAD, alert and oriented x3, sitting in wheelchair , obese HEENT- PERRL, EOMI, non injected sclera, pink conjunctiva, MMM, oropharynx clear Neck- Supple, no thryomegaly CVS- RRR, no murmur RESP-CTAB ABD-NABS,soft,NT,ND  EXT- No edema- Left, Right AKA, Left arm- well healed scar at site of seroma Pulses- Radial, DP- 2+        Assessment & Plan:

## 2011-10-28 NOTE — Assessment & Plan Note (Signed)
Blood pressure elevated today are her first visit. He is on hydralazine and Cardizem however he did not have the Cardizem with him today. He will have dialysis tomorrow. I will review his records to see what  His trends are and how they're titrating his medication

## 2011-11-16 ENCOUNTER — Other Ambulatory Visit: Payer: Self-pay | Admitting: Family Medicine

## 2011-11-17 ENCOUNTER — Telehealth: Payer: Self-pay | Admitting: Family Medicine

## 2011-11-19 ENCOUNTER — Ambulatory Visit (INDEPENDENT_AMBULATORY_CARE_PROVIDER_SITE_OTHER): Payer: PRIVATE HEALTH INSURANCE | Admitting: Family Medicine

## 2011-11-19 ENCOUNTER — Encounter: Payer: Self-pay | Admitting: Family Medicine

## 2011-11-19 VITALS — BP 104/70 | HR 81 | Resp 16

## 2011-11-19 DIAGNOSIS — I10 Essential (primary) hypertension: Secondary | ICD-10-CM

## 2011-11-19 DIAGNOSIS — E119 Type 2 diabetes mellitus without complications: Secondary | ICD-10-CM

## 2011-11-19 DIAGNOSIS — R42 Dizziness and giddiness: Secondary | ICD-10-CM

## 2011-11-19 LAB — BASIC METABOLIC PANEL
BUN: 11 mg/dL (ref 6–23)
Chloride: 104 mEq/L (ref 96–112)
Glucose, Bld: 97 mg/dL (ref 70–99)
Potassium: 3.9 mEq/L (ref 3.5–5.3)
Sodium: 143 mEq/L (ref 135–145)

## 2011-11-19 LAB — CBC
HCT: 34.2 % — ABNORMAL LOW (ref 39.0–52.0)
MCH: 31.2 pg (ref 26.0–34.0)
MCV: 95.3 fL (ref 78.0–100.0)
Platelets: 129 10*3/uL — ABNORMAL LOW (ref 150–400)
RBC: 3.59 MIL/uL — ABNORMAL LOW (ref 4.22–5.81)
RDW: 14.5 % (ref 11.5–15.5)

## 2011-11-19 MED ORDER — DIPHENOXYLATE-ATROPINE 2.5-0.025 MG PO TABS: ORAL_TABLET | ORAL | Status: DC

## 2011-11-19 NOTE — Patient Instructions (Signed)
You need to eat something when you take your insulin in the morning Continue your blood pressure pills Get the labs done  Keep previous follow-up appointment

## 2011-11-21 ENCOUNTER — Encounter: Payer: Self-pay | Admitting: Family Medicine

## 2011-11-21 DIAGNOSIS — R42 Dizziness and giddiness: Secondary | ICD-10-CM | POA: Insufficient documentation

## 2011-11-21 NOTE — Assessment & Plan Note (Signed)
I think this is multifactorial from HD which is why they are not pulling much fluid with his low normotensive BP, the fact pt takes his BP meds afterwards and taking insulin without any food for many hours . Discussed small snack in AM before HD when he takes his Insulin Check labs for his potassium I attempted to call his nephrologist regarding this he was not at HD or in his office, will try again on Monday, pt stable today and asymptomatic

## 2011-11-21 NOTE — Progress Notes (Signed)
  Subjective:    Patient ID: Julian [name], male    DOB: Sep 27, 1945, 66 y.o.   MRN: [id number]  HPI Pt presnts with dizziness for the past few weeks, he states his blood pressure has been dropping during dialysis and often they have to cut the machine off, when he goes home if feels weak and dizzy for a few hours and has to lay down, previous he was able to stay up. He denies cough, CP, SOB. Denies recent illness or sick contacts. He states nurse was to tell his nephrologist but he has not heard anything. He takes his BP meds after dialysis, he has also been injecting his morning Lantus before HD but not eating because previously he would ne naseous and vomit causing him to be late for HD Today he has had HD but did not have dizziness  Review of Systems - per above   GEN- + fatigue, denies fever, weight loss,weakness, recent illness HEENT- denies eye drainage, change in vision, nasal discharge, CVS- denies chest pain, palpitations RESP- denies SOB, cough, wheeze ABD- denies N/V, + chronic diarrhea, abd pain GU- denies dysuria, hematuria, dribbling, incontinence MSK- + joint pain, muscle aches, injury Neuro- denies headache, +dizziness, denies syncope, seizure activity       Objective:   Physical Exam GEN- NAD, alert and oriented x3, sitting in wheelchair , obese, no ill appearing HEENT- PERRL, EOMI, non injected sclera, pink conjunctiva, MMM, oropharynx clear Neck- Supple, CVS- RRR, no murmur RESP-CTAB ABD-NABS,soft,NT,ND  EXT- trace edema- Left, Right AKA, Left arm- well healed scar at site of seroma Pulses- Radial, DP- 2+        Assessment & Plan:

## 2011-11-21 NOTE — Assessment & Plan Note (Signed)
Check A1C, CBG 102 in office, discussed importance of food with insulin

## 2011-11-21 NOTE — Assessment & Plan Note (Signed)
BP looks good today but he is s/p HD, on non HD days he is quite hypertensive, he may need to hold BP meds on days of HD

## 2011-11-23 NOTE — Telephone Encounter (Signed)
Med was resent in last week

## 2011-11-29 ENCOUNTER — Other Ambulatory Visit: Payer: Self-pay

## 2011-11-29 DIAGNOSIS — T82898A Other specified complication of vascular prosthetic devices, implants and grafts, initial encounter: Secondary | ICD-10-CM

## 2011-12-01 ENCOUNTER — Emergency Department (HOSPITAL_COMMUNITY)
Admission: EM | Admit: 2011-12-01 | Discharge: 2011-12-01 | Disposition: A | Discharge status: Home / Self Care | Payer: PRIVATE HEALTH INSURANCE | Attending: Emergency Medicine | Admitting: Emergency Medicine

## 2011-12-01 ENCOUNTER — Encounter (HOSPITAL_COMMUNITY): Payer: Self-pay | Admitting: *Deleted

## 2011-12-01 ENCOUNTER — Emergency Department (HOSPITAL_COMMUNITY): Payer: PRIVATE HEALTH INSURANCE

## 2011-12-01 DIAGNOSIS — M545 Low back pain, unspecified: Secondary | ICD-10-CM

## 2011-12-01 DIAGNOSIS — M47817 Spondylosis without myelopathy or radiculopathy, lumbosacral region: Secondary | ICD-10-CM | POA: Insufficient documentation

## 2011-12-01 DIAGNOSIS — S78119A Complete traumatic amputation at level between unspecified hip and knee, initial encounter: Secondary | ICD-10-CM | POA: Insufficient documentation

## 2011-12-01 DIAGNOSIS — E119 Type 2 diabetes mellitus without complications: Secondary | ICD-10-CM | POA: Insufficient documentation

## 2011-12-01 DIAGNOSIS — G8929 Other chronic pain: Secondary | ICD-10-CM | POA: Insufficient documentation

## 2011-12-01 DIAGNOSIS — Z992 Dependence on renal dialysis: Secondary | ICD-10-CM | POA: Insufficient documentation

## 2011-12-01 DIAGNOSIS — N186 End stage renal disease: Secondary | ICD-10-CM | POA: Insufficient documentation

## 2011-12-01 DIAGNOSIS — I120 Hypertensive chronic kidney disease with stage 5 chronic kidney disease or end stage renal disease: Secondary | ICD-10-CM | POA: Insufficient documentation

## 2011-12-01 HISTORY — DX: Dependence on renal dialysis: N18.6

## 2011-12-01 HISTORY — DX: Other chronic pain: G89.29

## 2011-12-01 HISTORY — DX: Dependence on renal dialysis: Z99.2

## 2011-12-01 HISTORY — DX: Dorsalgia, unspecified: M54.9

## 2011-12-01 MED ORDER — OXYCODONE-ACETAMINOPHEN 5-325 MG PO TABS
1.0000 | ORAL_TABLET | Freq: Once | ORAL | Status: AC
  Administered 2011-12-01: 1 via ORAL
  Filled 2011-12-01: qty 1

## 2011-12-01 MED ORDER — OXYCODONE-ACETAMINOPHEN 5-325 MG PO TABS: ORAL_TABLET | ORAL | Status: DC
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
## 2011-12-01 NOTE — ED Notes (Signed)
Rt back pain since Sunday.  Hurts to move and lie on rt side.  No injury.  No N/V

## 2011-12-06 ENCOUNTER — Encounter: Payer: Self-pay | Admitting: Vascular Surgery

## 2011-12-07 ENCOUNTER — Ambulatory Visit (INDEPENDENT_AMBULATORY_CARE_PROVIDER_SITE_OTHER): Payer: PRIVATE HEALTH INSURANCE | Admitting: Vascular Surgery

## 2011-12-07 ENCOUNTER — Encounter: Payer: Self-pay | Admitting: Vascular Surgery

## 2011-12-07 ENCOUNTER — Other Ambulatory Visit: Payer: Self-pay

## 2011-12-07 ENCOUNTER — Encounter (INDEPENDENT_AMBULATORY_CARE_PROVIDER_SITE_OTHER): Payer: PRIVATE HEALTH INSURANCE | Admitting: *Deleted

## 2011-12-07 VITALS — BP 133/81 | HR 77 | Resp 20 | Ht 70.0 in | Wt 254.0 lb

## 2011-12-07 DIAGNOSIS — T82898A Other specified complication of vascular prosthetic devices, implants and grafts, initial encounter: Secondary | ICD-10-CM

## 2011-12-07 DIAGNOSIS — N186 End stage renal disease: Secondary | ICD-10-CM | POA: Insufficient documentation

## 2011-12-07 DIAGNOSIS — T82598A Other mechanical complication of other cardiac and vascular devices and implants, initial encounter: Secondary | ICD-10-CM

## 2011-12-07 NOTE — Progress Notes (Signed)
Subjective:     Patient ID: Julian [name], male   DOB: 10/20/1945, 66 y.o.   MRN: [id number]  HPI this 66 year old male with end-stage renal disease has dialysis Monday Wednesday and Friday. He had left basilic vein transposition performed by Dr. Imogene Burn in April 2013. Attempts to use the fistula have been unsuccessful because of it being "to deep" and also clots were obtained when trying to utilize it. Today he is to be evaluated regarding the left basilic vein transposition. He denies pain in the left hand. He occasionally will have some mild tingling in the left hand.  Past Medical History  Diagnosis Date  . Hypertension   . Diabetes mellitus   . Peripheral vascular disease   . Obesity   . End stage renal disease     on Hemodialysis  . ESRD on hemodialysis   . Chronic back pain     History  Substance Use Topics  . Smoking status: Never Smoker   . Smokeless tobacco: Never Used  . Alcohol Use: No    Family History  Problem Relation Age of Onset  . Hypertension Mother   . Diabetes Father   . Anesthesia problems Neg Hx   . Colon cancer Neg Hx     No Known Allergies  Current outpatient prescriptions:cinacalcet (SENSIPAR) 60 MG tablet, Take 120 mg by mouth daily. Takes 2 tablets at lunch, Disp: , Rfl: ;  diltiazem (CARDIZEM CD) 180 MG 24 hr capsule, Take 180 mg by mouth daily.  , Disp: , Rfl: ;  diphenoxylate-atropine (LOMOTIL) 2.5-0.025 MG per tablet, TAKE ONE TABLET BY MOUTH 3 TIMES A DAY AS NEEDED FOR DIARRHEA., Disp: 60 tablet, Rfl: 0 hydrALAZINE (APRESOLINE) 50 MG tablet, Take 50 mg by mouth 3 (three) times daily.  , Disp: , Rfl: ;  insulin glargine (LANTUS) 100 UNIT/ML injection, Inject 10-20 Units into the skin 2 (two) times daily. Patient takes 20 units in the morning and 10 units at bedtime, Disp: , Rfl: ;  loratadine (CLARITIN) 10 MG tablet, Take 10 mg by mouth daily., Disp: , Rfl: ;  multivitamin (RENA-VIT) TABS tablet, Take 1 tablet by mouth daily., Disp: , Rfl:  oxyCODONE  (OXY IR/ROXICODONE) 5 MG immediate release tablet, Take 5 mg by mouth every 4 (four) hours as needed. pain, Disp: , Rfl: ;  oxyCODONE-acetaminophen (PERCOCET/ROXICET) 5-325 MG per tablet, 1 tabs PO q6h prn pain, Disp: 20 tablet, Rfl: 0;  sevelamer (RENAGEL) 800 MG tablet, Take 2,400-4,000 mg by mouth 3 (three) times daily with meals. Take 5 tabs (4000mg ) 3 times daily with meals and 3 tabs (2400mg ) as needed with snacks., Disp: , Rfl:   BP 133/81  Pulse 77  Resp 20  Ht 5\' 10"  (1.778 m)  Wt 254 lb (115.214 kg)  BMI 36.45 kg/m2  Body mass index is 36.45 kg/(m^2).           Review of Systems denies active chest pain, dyspnea on exertion. He is in wheelchair. Has a right above-knee dictation    Objective:   Physical Exam blood pressure 133 bradycardia 1 heart rate 77 respirations 20 General chronically ill-appearing male no apparent distress in a wheelchair with a right AKA Left upper extremity with palpable pulse and thrill over the left basilic vein transposition. Radial pulses 1-2+. The fistula is somewhat deep and medial in location   Today I ordered a fistulogram of the left upper extremity. The basilic vein transposition is widely patent. There is an area of what appears to  be a severe stenosis in the proximal portion of the fistula 907 cm/s. The fistula varies in depth from 0.66-1 cm.     Assessment:     Poorly functioning left basilic vein transposition performed by Dr. Standley Brooking in April 2013 possible stenosis in vein possibly too deep to access    Plan:     Plan a fistulogram left upper extremity basilic vein transposition by Dr. Johny Drilling on Thursday, August 1 Patient may need procedure to bring vein more superficially in the left upper extremity if stenotic lesion can be remedied

## 2011-12-08 MED ORDER — SODIUM CHLORIDE 0.9 % IJ SOLN: 3.0000 mL | INTRAMUSCULAR | Status: DC | PRN

## 2011-12-09 ENCOUNTER — Encounter (HOSPITAL_COMMUNITY)
Admission: RE | Disposition: A | Discharge status: Home / Self Care | Payer: Self-pay | Source: Ambulatory Visit | Attending: Vascular Surgery

## 2011-12-09 ENCOUNTER — Ambulatory Visit (HOSPITAL_COMMUNITY)
Admission: RE | Admit: 2011-12-09 | Discharge: 2011-12-09 | Disposition: A | Discharge status: Home / Self Care | Payer: PRIVATE HEALTH INSURANCE | Source: Ambulatory Visit | Attending: Vascular Surgery | Admitting: Vascular Surgery

## 2011-12-09 DIAGNOSIS — Y832 Surgical operation with anastomosis, bypass or graft as the cause of abnormal reaction of the patient, or of later complication, without mention of misadventure at the time of the procedure: Secondary | ICD-10-CM | POA: Insufficient documentation

## 2011-12-09 DIAGNOSIS — Z992 Dependence on renal dialysis: Secondary | ICD-10-CM | POA: Insufficient documentation

## 2011-12-09 DIAGNOSIS — I739 Peripheral vascular disease, unspecified: Secondary | ICD-10-CM | POA: Insufficient documentation

## 2011-12-09 DIAGNOSIS — E119 Type 2 diabetes mellitus without complications: Secondary | ICD-10-CM | POA: Insufficient documentation

## 2011-12-09 DIAGNOSIS — I120 Hypertensive chronic kidney disease with stage 5 chronic kidney disease or end stage renal disease: Secondary | ICD-10-CM | POA: Insufficient documentation

## 2011-12-09 DIAGNOSIS — T82898A Other specified complication of vascular prosthetic devices, implants and grafts, initial encounter: Secondary | ICD-10-CM

## 2011-12-09 DIAGNOSIS — E669 Obesity, unspecified: Secondary | ICD-10-CM | POA: Insufficient documentation

## 2011-12-09 DIAGNOSIS — IMO0002 Reserved for concepts with insufficient information to code with codable children: Secondary | ICD-10-CM | POA: Insufficient documentation

## 2011-12-09 DIAGNOSIS — N186 End stage renal disease: Secondary | ICD-10-CM | POA: Insufficient documentation

## 2011-12-09 DIAGNOSIS — T82598A Other mechanical complication of other cardiac and vascular devices and implants, initial encounter: Secondary | ICD-10-CM | POA: Insufficient documentation

## 2011-12-09 HISTORY — PX: SHUNTOGRAM: SHX5491

## 2011-12-09 LAB — GLUCOSE, CAPILLARY: Glucose-Capillary: 140 mg/dL — ABNORMAL HIGH (ref 70–99)

## 2011-12-09 LAB — POCT I-STAT, CHEM 8
BUN: 21 mg/dL (ref 6–23)
HCT: 30 % — ABNORMAL LOW (ref 39.0–52.0)
Sodium: 141 mEq/L (ref 135–145)
TCO2: 24 mmol/L (ref 0–100)

## 2011-12-09 SURGERY — ASSESSMENT, SHUNT FUNCTION, WITH CONTRAST RADIOGRAPHIC STUDY
Anesthesia: LOCAL

## 2011-12-09 MED ORDER — LIDOCAINE HCL (PF) 1 % IJ SOLN
INTRAMUSCULAR | Status: AC
  Filled 2011-12-09: qty 30

## 2011-12-09 MED ORDER — HEPARIN (PORCINE) IN NACL 2-0.9 UNIT/ML-% IJ SOLN
INTRAMUSCULAR | Status: AC
  Filled 2011-12-09: qty 1000

## 2011-12-09 NOTE — H&P (Signed)
[facility] [place]  Brief History and Physical  History of Present Illness  Julian [name] is a 66 y.o. male who presents with chief complaint: malfunctioning L BVT.  The patient presents today for L arm fistulogram, possible intervention.    Past Medical History  Diagnosis Date  . Hypertension   . Diabetes mellitus   . Peripheral vascular disease   . Obesity   . End stage renal disease     on Hemodialysis  . ESRD on hemodialysis   . Chronic back pain     Past Surgical History  Procedure Date  . Above knee leg amputation     Right AKA  . Arteriovenous graft placement   . Pr vein bypass graft,aorto-fem-pop   . Insertion of dialysis catheter 07/30/2011    Procedure: INSERTION OF DIALYSIS CATHETER;  Surgeon: Larina Earthly, MD;  Location: [facility] OR;  Service: Vascular;  Laterality: Right;  removal of right dialysis catheter and exchange/ insertion of new right dialysis catheter  . Eye surgery   . Colonoscopy Dec 2007    RMR: normal rectum, colonic polyps with ascending colon polyp actively oozing, s/p hot snare polypectomy: tubulovillous adenoma, adenomatous polyps   . Av fistula placement   . Bascilic vein transposition 08/24/2011    Left upper arm    History   Social History  . Marital Status: Single    Spouse Name: N/A    Number of Children: N/A  . Years of Education: N/A   Occupational History  . Not on file.   Social History Main Topics  . Smoking status: Never Smoker   . Smokeless tobacco: Never Used  . Alcohol Use: No  . Drug Use: No  . Sexually Active: Not on file   Other Topics Concern  . Not on file   Social History Narrative  . No narrative on file    Family History  Problem Relation Age of Onset  . Hypertension Mother   . Diabetes Father   . Anesthesia problems Neg Hx   . Colon cancer Neg Hx     No current facility-administered medications on file prior to encounter.   Current Outpatient Prescriptions on File Prior to  Encounter  Medication Sig Dispense Refill  . cinacalcet (SENSIPAR) 60 MG tablet Take 120 mg by mouth daily. Takes 2 tablets at lunch      . diltiazem (CARDIZEM CD) 180 MG 24 hr capsule Take 180 mg by mouth daily.        . diphenoxylate-atropine (LOMOTIL) 2.5-0.025 MG per tablet TAKE ONE TABLET BY MOUTH 3 TIMES A DAY AS NEEDED FOR DIARRHEA.  60 tablet  0  . hydrALAZINE (APRESOLINE) 50 MG tablet Take 50 mg by mouth 3 (three) times daily.        . insulin glargine (LANTUS) 100 UNIT/ML injection Inject 10-20 Units into the skin 2 (two) times daily. Patient takes 20 units in the morning and 10 units at bedtime      . loratadine (CLARITIN) 10 MG tablet Take 10 mg by mouth daily.      . multivitamin (RENA-VIT) TABS tablet Take 1 tablet by mouth daily.      Marland Kitchen oxyCODONE (OXY IR/ROXICODONE) 5 MG immediate release tablet Take 5 mg by mouth every 4 (four) hours as needed. pain      . oxyCODONE-acetaminophen (PERCOCET/ROXICET) 5-325 MG per tablet 1 tabs PO q6h prn pain  20 tablet  0  . sevelamer (RENAGEL) 800 MG tablet Take 2,400-4,000  mg by mouth 3 (three) times daily with meals. Take 5 tabs (4000mg ) 3 times daily with meals and 3 tabs (2400mg ) as needed with snacks.        No Known Allergies  Review of Systems: As listed above, otherwise negative.  Physical Examination  Filed Vitals:   12/09/11 0828  BP: 174/85  Pulse: 85  Temp: 98.2 F (36.8 C)  TempSrc: Oral  Resp: 18  Height: 5\' 10"  (1.778 m)  Weight: 254 lb (115.214 kg)  SpO2: 98%    General: A&O x 3, WDWN  Pulmonary: Sym exp, good air movt, CTAB, no rales, rhonchi, & wheezing  Cardiac: RRR, Nl S1, S2, no Murmurs, rubs or gallops  Gastrointestinal: soft, NTND, -G/R, - HSM, - masses, - CVAT B  Musculoskeletal: M/S 5/5 throughout , Extremities without ischemic changes , thrill and bruit in L arm  Laboratory See iStat  Medical Decision Making  Julian [name] is a 66 y.o. male who presents with: malfunctioning L BVT.   The  patient is scheduled for: LLE angiogram, possible intervention I discussed with the patient the nature of angiographic procedures, especially the limited patencies of any endovascular intervention.  The patient is aware of that the risks of an angiographic procedure include but are not limited to: bleeding, infection, access site complications, renal failure, embolization, rupture of vessel, dissection, possible need for emergent surgical intervention, possible need for surgical procedures to treat the patient's pathology, and stroke and death.    The patient is aware of the risks and agrees to proceed.  [name], MD [facility] [place] Office: [phone] Pager: [phone]  12/09/2011, 7:38 AM

## 2011-12-09 NOTE — Progress Notes (Addendum)
IVF stopped while in Cardiac cath lab holding area.   Blue port with 2cc blood return obtained, then flushed 10cc NS.   Dead end cap applied and both ports taped and clamped closed.   Gauze drsg D/I.  Instilled 2.4cc Heparin into blue port (1,000units/cc).

## 2011-12-09 NOTE — Op Note (Signed)
OPERATIVE NOTE   PROCEDURE: 1.  left basilic vein transposition cannulation under ultrasound guidance 2.  left arm fistulogram  PRE-OPERATIVE DIAGNOSIS: Malfunctioning left basilic vein transposition   POST-OPERATIVE DIAGNOSIS: same as above   SURGEON: [name], MD  ANESTHESIA: local  ESTIMATED BLOOD LOSS: 5 cc  FINDING(S): 1. Perianastomotic stenosis in vein 2. Severe scarring in tissue superficial to fistula.    SPECIMEN(S):  None  CONTRAST: 30 cc  INDICATIONS: Julian [name] is a 66 y.o. male who presents with malfunctioning left basilic vein transposition.  The patient is scheduled for left arm fistulogram.  The patient is aware the risks include but are not limited to: bleeding, infection, thrombosis of the cannulated access, and possible anaphylactic reaction to the contrast.  The patient is aware of the risks of the procedure and elects to proceed forward.  DESCRIPTION: After full informed written consent was obtained, the patient was brought back to the angiography suite and placed supine upon the angiography table.  The patient was connected to monitoring equipment.  The left arm was prepped and draped in the standard fashion for a percutaneous access intervention.  Under ultrasound guidance, the left basilic vein transposition was cannulated with a micropuncture needle.  The microwire was advanced into the fistula and the needle was exchanged for the a microsheath, which was lodged 2 cm into the access.  The wire was removed and the sheath was connected to the IV extension tubing.  Hand injections were completed to image the access from the antecubitum up to the level of axilla.  The central venous structures were also imaged by hand injections.  Based on the images, this patient will need: attempted at angioplasty of the fistula in the perianastomotic segment.  A 4-0 Monocryl purse-string suture was sewn around the sheath.  The sheath was removed while tying down the  suture.  Under ultrasound guidance, I attempted to cannulate the fistula along it more proximal segment.  At multiple sites, there was severe scarring which limited my ability to successfully access the fistula.  A couple of times the fistula was cannulated but I could not thread the wire.  In my opinion, if this fistula is this difficult to cannulate under ultrasound guidance, it is not going to be usable for hemodialysis.  I recommend conversion of this fistula to a graft.  A sterile bandage was applied to the puncture site.  A quarter sized hematoma developed since removing the previous sheath.  COMPLICATIONS: hematoma  CONDITION: stable  [name], MD [facility] [place] Office: [phone] Pager: [phone]  12/09/2011 12:45 PM

## 2011-12-10 ENCOUNTER — Other Ambulatory Visit: Payer: Self-pay | Admitting: *Deleted

## 2011-12-10 ENCOUNTER — Encounter (HOSPITAL_COMMUNITY): Payer: Self-pay | Admitting: Pharmacy Technician

## 2011-12-20 NOTE — Procedures (Unsigned)
VASCULAR LAB EXAM  INDICATION:  End-stage renal disease, difficulty cannulating.  HISTORY: Left brachiobasilic arteriovenous fistula. Right above knee amputation. Diabetes:  No Cardiac: Hypertension:  EXAM:  Duplex imaging of the left brachial to basilic arteriovenous fistula.  IMPRESSION: 1. Patent left brachiobasilic fistula. 2. 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 3. Increased velocity (907 cm/s) near the area of the valve leaflets. 4. A branch is noted in the mid to upper arm measuring approximately     0.19 cm.  ___________________________________________ Quita Skye. Hart Rochester, M.D.  SS/MEDQ  D:  12/07/2011  T:  12/07/2011  Job:  [id number]

## 2011-12-24 ENCOUNTER — Ambulatory Visit: Payer: PRIVATE HEALTH INSURANCE | Admitting: Vascular Surgery

## 2012-01-03 ENCOUNTER — Encounter (HOSPITAL_COMMUNITY): Payer: Self-pay | Admitting: *Deleted

## 2012-01-03 MED ORDER — DEXTROSE 5 % IV SOLN
1.5000 g | INTRAVENOUS | Status: AC
  Administered 2012-01-04: 1.5 g via INTRAVENOUS
  Filled 2012-01-03: qty 1.5

## 2012-01-03 NOTE — Progress Notes (Addendum)
Pt denies seeing a cardiologist or having a stress test or 2 D Echo.  Pt lives alone, I instructed him that he needs someone to stay with him the 1st 24 hours after surgery, pt said he will call his aide to stay with him.  Pt is not sure what agency aide is from.  Pt said that he would not take any medication in the am, "I just took my medications a little while ago after I came home from dialysis."

## 2012-01-04 ENCOUNTER — Ambulatory Visit (HOSPITAL_COMMUNITY)
Admission: RE | Admit: 2012-01-04 | Discharge: 2012-01-04 | Disposition: A | Discharge status: Home / Self Care | Payer: PRIVATE HEALTH INSURANCE | Source: Ambulatory Visit | Attending: Vascular Surgery | Admitting: Vascular Surgery

## 2012-01-04 ENCOUNTER — Encounter (HOSPITAL_COMMUNITY): Payer: Self-pay | Admitting: Anesthesiology

## 2012-01-04 ENCOUNTER — Telehealth: Payer: Self-pay | Admitting: Vascular Surgery

## 2012-01-04 ENCOUNTER — Ambulatory Visit (HOSPITAL_COMMUNITY): Payer: PRIVATE HEALTH INSURANCE | Admitting: Anesthesiology

## 2012-01-04 ENCOUNTER — Encounter (HOSPITAL_COMMUNITY)
Admission: RE | Disposition: A | Discharge status: Home / Self Care | Payer: Self-pay | Source: Ambulatory Visit | Attending: Vascular Surgery

## 2012-01-04 DIAGNOSIS — N186 End stage renal disease: Secondary | ICD-10-CM | POA: Insufficient documentation

## 2012-01-04 DIAGNOSIS — I120 Hypertensive chronic kidney disease with stage 5 chronic kidney disease or end stage renal disease: Secondary | ICD-10-CM | POA: Insufficient documentation

## 2012-01-04 DIAGNOSIS — Y832 Surgical operation with anastomosis, bypass or graft as the cause of abnormal reaction of the patient, or of later complication, without mention of misadventure at the time of the procedure: Secondary | ICD-10-CM | POA: Insufficient documentation

## 2012-01-04 DIAGNOSIS — E119 Type 2 diabetes mellitus without complications: Secondary | ICD-10-CM | POA: Insufficient documentation

## 2012-01-04 DIAGNOSIS — E669 Obesity, unspecified: Secondary | ICD-10-CM | POA: Insufficient documentation

## 2012-01-04 DIAGNOSIS — T82898A Other specified complication of vascular prosthetic devices, implants and grafts, initial encounter: Secondary | ICD-10-CM | POA: Insufficient documentation

## 2012-01-04 DIAGNOSIS — I871 Compression of vein: Secondary | ICD-10-CM | POA: Insufficient documentation

## 2012-01-04 DIAGNOSIS — Z992 Dependence on renal dialysis: Secondary | ICD-10-CM

## 2012-01-04 HISTORY — PX: AV FISTULA PLACEMENT: SHX1204

## 2012-01-04 LAB — GLUCOSE, CAPILLARY: Glucose-Capillary: 125 mg/dL — ABNORMAL HIGH (ref 70–99)

## 2012-01-04 LAB — POCT I-STAT 4, (NA,K, GLUC, HGB,HCT)
Glucose, Bld: 171 mg/dL — ABNORMAL HIGH (ref 70–99)
HCT: 34 % — ABNORMAL LOW (ref 39.0–52.0)
Hemoglobin: 11.6 g/dL — ABNORMAL LOW (ref 13.0–17.0)

## 2012-01-04 LAB — SURGICAL PCR SCREEN: Staphylococcus aureus: NEGATIVE

## 2012-01-04 LAB — PROTIME-INR: Prothrombin Time: 13.2 seconds (ref 11.6–15.2)

## 2012-01-04 SURGERY — INSERTION OF ARTERIOVENOUS (AV) GORE-TEX GRAFT ARM
Anesthesia: General | Site: Arm Upper | Laterality: Left | Wound class: Clean

## 2012-01-04 MED ORDER — PROTAMINE SULFATE 10 MG/ML IV SOLN
INTRAVENOUS | Status: DC | PRN
  Administered 2012-01-04: 15 mg via INTRAVENOUS

## 2012-01-04 MED ORDER — SODIUM CHLORIDE 0.9 % IV SOLN
INTRAVENOUS | Status: DC | PRN
  Administered 2012-01-04 (×2): via INTRAVENOUS

## 2012-01-04 MED ORDER — MUPIROCIN 2 % EX OINT: TOPICAL_OINTMENT | Freq: Two times a day (BID) | CUTANEOUS | Status: DC

## 2012-01-04 MED ORDER — THROMBIN 20000 UNITS EX SOLR
CUTANEOUS | Status: AC
  Filled 2012-01-04: qty 20000

## 2012-01-04 MED ORDER — THROMBIN 20000 UNITS EX SOLR
CUTANEOUS | Status: DC | PRN
  Administered 2012-01-04: 13:00:00 via TOPICAL

## 2012-01-04 MED ORDER — LIDOCAINE-EPINEPHRINE (PF) 1 %-1:200000 IJ SOLN
INTRAMUSCULAR | Status: AC
  Filled 2012-01-04: qty 10

## 2012-01-04 MED ORDER — SODIUM CHLORIDE 0.9 % IR SOLN
Status: DC | PRN
  Administered 2012-01-04: 12:00:00

## 2012-01-04 MED ORDER — OXYCODONE HCL 5 MG PO TABS: 5.0000 mg | ORAL_TABLET | ORAL | Status: AC | PRN

## 2012-01-04 MED ORDER — THROMBIN 20000 UNITS EX KIT: PACK | CUTANEOUS | Status: DC | PRN

## 2012-01-04 MED ORDER — BUPIVACAINE HCL (PF) 0.5 % IJ SOLN
INTRAMUSCULAR | Status: DC | PRN
  Administered 2012-01-04: 30 mL

## 2012-01-04 MED ORDER — SODIUM CHLORIDE 0.9 % IV SOLN
INTRAVENOUS | Status: DC
  Administered 2012-01-04: 10:00:00 via INTRAVENOUS

## 2012-01-04 MED ORDER — PHENYLEPHRINE HCL 10 MG/ML IJ SOLN
INTRAMUSCULAR | Status: DC | PRN
  Administered 2012-01-04: 40 ug via INTRAVENOUS
  Administered 2012-01-04 (×4): 80 ug via INTRAVENOUS

## 2012-01-04 MED ORDER — HYDROMORPHONE HCL PF 1 MG/ML IJ SOLN
0.2500 mg | INTRAMUSCULAR | Status: DC | PRN
  Administered 2012-01-04: 0.5 mg via INTRAVENOUS

## 2012-01-04 MED ORDER — PROPOFOL INFUSION 10 MG/ML OPTIME
INTRAVENOUS | Status: DC | PRN
  Administered 2012-01-04: 50 ug/kg/min via INTRAVENOUS

## 2012-01-04 MED ORDER — HEPARIN SODIUM (PORCINE) 1000 UNIT/ML IJ SOLN
INTRAMUSCULAR | Status: DC | PRN
  Administered 2012-01-04: 3000 [IU] via INTRAVENOUS

## 2012-01-04 MED ORDER — MIDAZOLAM HCL 5 MG/5ML IJ SOLN
INTRAMUSCULAR | Status: DC | PRN
  Administered 2012-01-04: 2 mg via INTRAVENOUS

## 2012-01-04 MED ORDER — HYDROMORPHONE HCL PF 1 MG/ML IJ SOLN
INTRAMUSCULAR | Status: AC
  Administered 2012-01-04: 0.5 mg via INTRAVENOUS
  Filled 2012-01-04: qty 1

## 2012-01-04 MED ORDER — LIDOCAINE-EPINEPHRINE (PF) 1 %-1:200000 IJ SOLN
INTRAMUSCULAR | Status: DC | PRN
  Administered 2012-01-04: 30 mL

## 2012-01-04 MED ORDER — FENTANYL CITRATE 0.05 MG/ML IJ SOLN
INTRAMUSCULAR | Status: DC | PRN
  Administered 2012-01-04: 50 ug via INTRAVENOUS

## 2012-01-04 MED ORDER — BUPIVACAINE HCL (PF) 0.5 % IJ SOLN
INTRAMUSCULAR | Status: AC
  Filled 2012-01-04: qty 30

## 2012-01-04 MED ORDER — MUPIROCIN 2 % EX OINT
TOPICAL_OINTMENT | CUTANEOUS | Status: AC
  Administered 2012-01-04: 1 via NASAL
  Filled 2012-01-04: qty 22

## 2012-01-04 MED ORDER — 0.9 % SODIUM CHLORIDE (POUR BTL) OPTIME
TOPICAL | Status: DC | PRN
  Administered 2012-01-04: 1000 mL

## 2012-01-04 SURGICAL SUPPLY — 34 items
CANISTER SUCTION 2500CC (MISCELLANEOUS) ×2 IMPLANT
CATH EMB 4FR 40CM (CATHETERS) ×2 IMPLANT
CLIP TI MEDIUM 6 (CLIP) ×2 IMPLANT
CLIP TI WIDE RED SMALL 6 (CLIP) ×2 IMPLANT
CLOTH BEACON ORANGE TIMEOUT ST (SAFETY) ×2 IMPLANT
COVER SURGICAL LIGHT HANDLE (MISCELLANEOUS) ×2 IMPLANT
DECANTER SPIKE VIAL GLASS SM (MISCELLANEOUS) ×2 IMPLANT
DERMABOND ADVANCED (GAUZE/BANDAGES/DRESSINGS) ×1
DERMABOND ADVANCED .7 DNX12 (GAUZE/BANDAGES/DRESSINGS) ×1 IMPLANT
ELECT REM PT RETURN 9FT ADLT (ELECTROSURGICAL) ×2
ELECTRODE REM PT RTRN 9FT ADLT (ELECTROSURGICAL) ×1 IMPLANT
GLOVE BIO SURGEON STRL SZ7 (GLOVE) ×2 IMPLANT
GLOVE BIOGEL PI IND STRL 7.5 (GLOVE) ×1 IMPLANT
GLOVE BIOGEL PI INDICATOR 7.5 (GLOVE) ×1
GOWN STRL NON-REIN LRG LVL3 (GOWN DISPOSABLE) ×4 IMPLANT
GRAFT GORETEX STRT 4-7X45 (Vascular Products) ×2 IMPLANT
KIT BASIN OR (CUSTOM PROCEDURE TRAY) ×2 IMPLANT
KIT ROOM TURNOVER OR (KITS) ×2 IMPLANT
NS IRRIG 1000ML POUR BTL (IV SOLUTION) ×2 IMPLANT
PACK CV ACCESS (CUSTOM PROCEDURE TRAY) ×2 IMPLANT
PAD ARMBOARD 7.5X6 YLW CONV (MISCELLANEOUS) ×4 IMPLANT
SPONGE SURGIFOAM ABS GEL 100 (HEMOSTASIS) IMPLANT
SUT MNCRL AB 4-0 PS2 18 (SUTURE) ×2 IMPLANT
SUT PROLENE 5 0 C 1 24 (SUTURE) ×4 IMPLANT
SUT PROLENE 6 0 BV (SUTURE) ×4 IMPLANT
SUT PROLENE 7 0 BV 1 (SUTURE) IMPLANT
SUT SILK 2 0 FS (SUTURE) ×2 IMPLANT
SUT SILK 2 0 SH CR/8 (SUTURE) ×2 IMPLANT
SUT VIC AB 3-0 SH 27 (SUTURE) ×2
SUT VIC AB 3-0 SH 27X BRD (SUTURE) ×2 IMPLANT
TOWEL OR 17X24 6PK STRL BLUE (TOWEL DISPOSABLE) ×2 IMPLANT
TOWEL OR 17X26 10 PK STRL BLUE (TOWEL DISPOSABLE) ×2 IMPLANT
UNDERPAD 30X30 INCONTINENT (UNDERPADS AND DIAPERS) ×2 IMPLANT
WATER STERILE IRR 1000ML POUR (IV SOLUTION) ×2 IMPLANT

## 2012-01-04 NOTE — Telephone Encounter (Signed)
Message copied by Rosalyn Charters on Tue Jan 04, 2012  4:54 PM ------      Message from: Melene Plan      Created: Tue Jan 04, 2012  2:18 PM                   ----- Message -----         From: Fransisco Hertz, MD         Sent: 01/04/2012   1:45 PM           To: Reuel Derby, Melene Plan, RN            Julian [name]      [id number]      [birth year]            PROCEDURE:        1. Left upper arm arteriovenous graft      2. Ligation of left basilic vein transposition             Follow-up: 4 wk

## 2012-01-04 NOTE — Transfer of Care (Signed)
Immediate Anesthesia Transfer of Care Note  Patient: Julian [name]  Procedure(s) Performed: Procedure(s) (LRB): INSERTION OF ARTERIOVENOUS (AV) GORE-TEX GRAFT ARM (Left)  Patient Location: PACU  Anesthesia Type: MAC  Level of Consciousness: awake, alert , oriented and patient cooperative  Airway & Oxygen Therapy: Patient Spontanous Breathing and Patient connected to nasal cannula oxygen  Post-op Assessment: Report given to PACU RN, Post -op Vital signs reviewed and stable and Patient moving all extremities  Post vital signs: Reviewed and stable  Complications: No apparent anesthesia complications

## 2012-01-04 NOTE — Anesthesia Postprocedure Evaluation (Signed)
  Anesthesia Post-op Note  Patient: Julian [name]  Procedure(s) Performed: Procedure(s) (LRB): INSERTION OF ARTERIOVENOUS (AV) GORE-TEX GRAFT ARM (Left)  Patient Location: PACU  Anesthesia Type: General  Level of Consciousness: awake  Airway and Oxygen Therapy: Patient Spontanous Breathing  Post-op Pain: mild  Post-op Assessment: Post-op Vital signs reviewed  Post-op Vital Signs: Reviewed  Complications: No apparent anesthesia complications

## 2012-01-04 NOTE — Op Note (Addendum)
OPERATIVE NOTE   PROCEDURE:  1. Left upper arm arteriovenous graft 2. Ligation of left basilic vein transposition   PRE-OPERATIVE DIAGNOSIS: failing left basilic vein transposition   POST-OPERATIVE DIAGNOSIS: same as above   SURGEON: [name], MD  ASSISTANT(S): Lianne Cure, PAC   ANESTHESIA: general  ESTIMATED BLOOD LOSS: 100 cc  FINDING(S): 1. Calcific atherosclerotic brachial artery 2. Patent basilic vein in upper arm  SPECIMEN(S):  none  INDICATIONS:   [name] is a 66 y.o. male who presents with malfunctioning basilic vein transposition.  During a fistulogram, I determined there was significant venous stenosis in the distal basilic vein.  Unfortunately, I had extreme difficulty cannulating the mid-segment of this basilic vein transposition, even with use of ultrasound guidance.  I did not think this basilic vein transposition was salvagable and this patient would be better served with a left upper arm graft.  Risk, benefits, and alternatives to access surgery were discussed.  The patient is aware the risks include but are not limited to: bleeding, infection, steal syndrome, nerve damage, ischemic monomelic neuropathy, failure to mature, and need for additional procedures.  The patient is aware of the risks and elects to proceed forward.  DESCRIPTION: After full informed written consent was obtained from the patient, the patient was brought back to the operating room and placed supine upon the operating table.  The patient was given IV antibiotics prior to proceeding.  After obtaining adequate sedation, the patient was prepped and draped in standard fashion for a left arm access procedure.  I turned my attention first to the antecubitum.  Under ultrasound guidance, I identified the location of the brachial artery and marked it on the skin.  I then examined the bicipital groove and identified the proximal basilic vein and marked it on the skin.  I injected a mixture of 1%  lidocaine with epinephrine and 0.5% Marcaine without epinephrine at this location and at the high bicipital groove to obtain some anesthesia.  In total, I used 60 mL of this mixture to obtain anesthesia at the axillary incision and also this high bicipital incision and also for the tunnel route.  I made an incision over the brachial artery, and dissected down through the subcutaneous tissue to the fascia carefully and was able to dissect out the brachial artery.  The artery was about 3 mm externally and extremely calcified and disease.  I dissected the artery more distally and found a segment less calcified.   It was controlled proximally and distally with vessel loops.  In this process, I encountered the anastomosis of the basilic vein transposition.  This was ligated and transected to facilitate the new anastomosis.  I then turned my attention to the high bicipital groove.  I made an incision at the previously anesthetized site, dissected down through the subcutaneous tissue and fascia until I reached the proximal basilic vein.  Externally, it appeared to be 6 mm in diameter.  I then dissected this vein proximally and distal.  I tied off the basilic vein distally in this exposure.  I then transected this vein and passed a 4 Fogarty proximally.  I extracted no clot, though there were a few areas of resistance.  Reasonable backbleeding was obtained.  I injected heparinized saline and then clamped the vein.  I then I injected the mixture of local along the marked route for a subcutaneous tunnel.  I took a Company secretary and dissected from the antecubital up to the high bicipital incision.  Then I delivered the 4 x 7-mm stretch Gore-Tex graft, through this metal tunneler and then pulled out the metal tunneler leaving the graft in place.  The 4-mm end was left on the antecubital side and the 7 mm toward the axillary.  I then gave the patient 3000 units of heparin to gain some anticoagulation.  After waiting 3  minutes, I placed the brachial artery under tension proximally and distally with vessel loops, made an arteriotomy and extended it with a Potts scissor.  I sewed the 4-mm end of the graft to this arteriotomy with a running stitch of 6-0 Prolene.  At this point, then I completed the anastomosis in the usual fashion.  I released the vessel loops on the inflow and allowed the artery to decompress through the graft. There was good pulsatile bleeding through this graft.  I clamped the graft near its arterial anastomosis and sucked out all the blood in the graft and loaded the graft with heparinized saline.  At this point, I pulled the graft to appropriate length and reset my exposure of the high brachial vein.  I tied off the high brachial vein distally with a 2-0 silk and then transected it.  There was good venous backbleeding from the vein.  Then, I injected some heparinized saline into this vein and then clamped it.  I then spatulated the vein to facilitate an end-to- end anastomosis.  I also spatulated the graft to facilitate an end-to-end anastomosis.  In the process of spatulating, I cut the graft to appropriate length for this anastomosis.  This graft was sewn to the vein in an end-to-end configuration with a 6-0 Prolene.  Prior to completing this anastomosis, I allowed the vein to back bleed and then I also allowed the artery to bleed in an antegrade fashion.  I completed this anastomosis in the usual fashion, and then irrigated out the high bicipital exposure and then placed thrombin and Gelfoam.  I then turned my attention back to the antecubitum.  The distal radial pulse was dopplerablee.  Using a Doppler and biphasic signal, the brachial artery proximally and distally had multiphasic waveforms.  The venous outflow had a flow signature consistent with widely patent arteriovenous graft.  At this point, I washed out the antecubital incision.  There was no more active bleeding.  The subcutaneous tissue was  reapproximated with a running stitch of 3-0 Vicryl.  The skin was then reapproximated with a running subcuticular 4-0 Monocryl.  The skin was then cleaned, dried, and Dermabond used to reinforce the skin closure.  We then turned our attention to the high bicipital exposure.  I removed all the thrombin and Gelfoam and washed out the wound.  There was no more active bleeding.  The subcutaneous tissue was repaired with running stitch of 3-0 Vicryl.  The skin was then reapproximated with running subcuticular 4-0 Monocryl.  The skin was then cleaned, dried, and then the skin closure was reinforced with Dermabond.    COMPLICATIONS: none  CONDITION: stable   Nilda Simmer, MD 01/04/2012 1:35 PM

## 2012-01-04 NOTE — Progress Notes (Signed)
WENT OVER DISCHARGE INSTRUCTIONS WITH PATIENT AND SENT HIM HOME WITH PAIN RX (ROXICODONE).  PATIENT WILL HAVE AIDE WITH HIM THIS EVENING.

## 2012-01-04 NOTE — H&P (Signed)
[facility]  Brief History and Physical  History of Present Illness  Julian [name] is a 66 y.o. male who presents with chief complaint: malfunctioning L BVT.  The patient presents today for placement of LUA AVG.    Past Medical History  Diagnosis Date  . Hypertension   . Diabetes mellitus   . Peripheral vascular disease   . Obesity   . End stage renal disease     on Hemodialysis  . ESRD on hemodialysis   . Chronic back pain     Past Surgical History  Procedure Date  . Above knee leg amputation     Right AKA  . Arteriovenous graft placement   . Pr vein bypass graft,aorto-fem-pop   . Insertion of dialysis catheter 07/30/2011    Procedure: INSERTION OF DIALYSIS CATHETER;  Surgeon: Larina Earthly, MD;  Location: [facility] OR;  Service: Vascular;  Laterality: Right;  removal of right dialysis catheter and exchange/ insertion of new right dialysis catheter  . Colonoscopy Dec 2007    RMR: normal rectum, colonic polyps with ascending colon polyp actively oozing, s/p hot snare polypectomy: tubulovillous adenoma, adenomatous polyps   . Av fistula placement   . Bascilic vein transposition 08/24/2011    Left upper arm  . Eye surgery     'not sure what kind'    History   Social History  . Marital Status: Single    Spouse Name: N/A    Number of Children: N/A  . Years of Education: N/A   Occupational History  . Not on file.   Social History Main Topics  . Smoking status: Never Smoker   . Smokeless tobacco: Never Used  . Alcohol Use: No  . Drug Use: No  . Sexually Active: Not on file   Other Topics Concern  . Not on file   Social History Narrative  . No narrative on file    Family History  Problem Relation Age of Onset  . Hypertension Mother   . Diabetes Father   . Anesthesia problems Neg Hx   . Colon cancer Neg Hx     No current facility-administered medications on file prior to encounter.   Current Outpatient Prescriptions on File  Prior to Encounter  Medication Sig Dispense Refill  . cinacalcet (SENSIPAR) 60 MG tablet Take 120 mg by mouth daily. Takes 2 tablets at lunch      . diltiazem (CARDIZEM CD) 180 MG 24 hr capsule Take 180 mg by mouth daily.       . hydrALAZINE (APRESOLINE) 50 MG tablet Take 50 mg by mouth 3 (three) times daily.       . insulin glargine (LANTUS) 100 UNIT/ML injection Inject 10-20 Units into the skin 2 (two) times daily. Patient takes 20 units in the morning and 10 units at bedtime      . loratadine (CLARITIN) 10 MG tablet Take 10 mg by mouth daily.      . multivitamin (RENA-VIT) TABS tablet Take 1 tablet by mouth daily.      Marland Kitchen oxyCODONE (OXY IR/ROXICODONE) 5 MG immediate release tablet Take 5 mg by mouth every 4 (four) hours as needed. pain      . sevelamer (RENAGEL) 800 MG tablet Take 2,400-4,000 mg by mouth 2 (two) times daily with a meal. Take 5 tabs (4000mg ) 2 times daily with meals and 3 tabs (2400mg ) as needed with snacks.        No Known Allergies  Review of Systems:  As listed above, otherwise negative.  Physical Examination  Filed Vitals:   01/04/12 0727 01/04/12 0739  BP: 177/64   Pulse: 83   Temp: 98 F (36.7 C)   TempSrc: Oral   Resp: 18   Weight:  251 lb 12.3 oz (114.2 kg)  SpO2: 99%     General: A&O x 3, WDWN  Pulmonary: Sym exp, good air movt, CTAB, no rales, rhonchi, & wheezing  Cardiac: RRR, Nl S1, S2, no Murmurs, rubs or gallops  Gastrointestinal: soft, NTND, -G/R, - HSM, - masses, - CVAT B  Musculoskeletal: M/S 5/5 throughout , Extremities without ischemic changes , weak thrill and bruit in LUA  Laboratory See iStat  Medical Decision Making  Julian [name] is a 66 y.o. male who presents with: malfunctioning BVT.   The patient is scheduled for: LUA AVG placement.  Even the difficult cannulating the fistula under ultrasound guidance during his previous fistulogram, I don't think this BVT will ever be useful to the patient, so I had recommended proceed  with LUA AVG placement.  Risk, benefits, and alternatives to access surgery were discussed.  The patient is aware the risks include but are not limited to: bleeding, infection, steal syndrome, nerve damage, ischemic monomelic neuropathy, failure to mature, and need for additional procedures.  The patient is aware of the risks and agrees to proceed.  [name], MD [facility] [place] Office: [phone] Pager: [phone]  01/04/2012, 8:12 AM

## 2012-01-04 NOTE — Progress Notes (Signed)
Call to Dr. Randa Evens, reviewed pt. Social situation that he wants to leave the HOSP. Today /w [facility] transport & have an AID meet him at his home. Dr. Randa Evens relinquishes decision on this matter to Dr. Imogene Burn. Call to Dr. Imogene Burn, reported same & he approves for the pt. To leave today with Transportation system, without family accompanying him.

## 2012-01-04 NOTE — Anesthesia Preprocedure Evaluation (Signed)
Anesthesia Evaluation  Patient identified by MRN, date of birth, ID band  Reviewed: Allergy & Precautions, H&P , NPO status , Patient's Chart, lab work & pertinent test results  Airway Mallampati: II      Dental   Pulmonary neg pulmonary ROS,          Cardiovascular hypertension, Pt. on medications     Neuro/Psych negative neurological ROS  negative psych ROS   GI/Hepatic negative GI ROS, Neg liver ROS,   Endo/Other  Type 2  Renal/GU CRFRenal disease     Musculoskeletal   Abdominal   Peds  Hematology   Anesthesia Other Findings   Reproductive/Obstetrics                           Anesthesia Physical Anesthesia Plan  ASA: III  Anesthesia Plan: General   Post-op Pain Management:    Induction: Intravenous  Airway Management Planned: LMA  Additional Equipment:   Intra-op Plan:   Post-operative Plan: Extubation in OR  Informed Consent: I have reviewed the patients History and Physical, chart, labs and discussed the procedure including the risks, benefits and alternatives for the proposed anesthesia with the patient or authorized representative who has indicated his/her understanding and acceptance.     Plan Discussed with: CRNA, Anesthesiologist and Surgeon  Anesthesia Plan Comments:         Anesthesia Quick Evaluation

## 2012-01-05 ENCOUNTER — Telehealth: Payer: Self-pay | Admitting: Vascular Surgery

## 2012-01-05 ENCOUNTER — Encounter (HOSPITAL_COMMUNITY): Payer: Self-pay | Admitting: Vascular Surgery

## 2012-01-05 NOTE — Telephone Encounter (Addendum)
Message copied by Rosalyn Charters on Wed Jan 05, 2012  9:23 AM ------      Message from: Melene Plan      Created: Tue Jan 04, 2012  2:18 PM                   ----- Message -----         From: Fransisco Hertz, MD         Sent: 01/04/2012   1:45 PM           To: Reuel Derby, Melene Plan, RN            ZACHAREY JENSEN      [id number]      12-28-1945            PROCEDURE:        1. Left upper arm arteriovenous graft      2. Ligation of left basilic vein transposition             Follow-up: 4 wk            notified patient of fu appt. on 01-28-12 at 11:15 with dr. Imogene Burn

## 2012-01-11 ENCOUNTER — Telehealth: Payer: Self-pay | Admitting: Family Medicine

## 2012-01-11 NOTE — Telephone Encounter (Signed)
Can he have a refill on the lomotil? Tried to call pt but no answer

## 2012-01-11 NOTE — Telephone Encounter (Signed)
Refill lomotil x 1 and let him know please

## 2012-01-12 ENCOUNTER — Telehealth: Payer: Self-pay | Admitting: Family Medicine

## 2012-01-12 NOTE — Telephone Encounter (Signed)
Called in to pharmacy to [facility]

## 2012-01-12 NOTE — Telephone Encounter (Signed)
Refilled

## 2012-01-20 ENCOUNTER — Ambulatory Visit: Payer: PRIVATE HEALTH INSURANCE | Admitting: Vascular Surgery

## 2012-01-21 ENCOUNTER — Emergency Department (HOSPITAL_COMMUNITY)
Admission: EM | Admit: 2012-01-21 | Discharge: 2012-01-22 | Disposition: A | Discharge status: Home / Self Care | Payer: PRIVATE HEALTH INSURANCE | Attending: Emergency Medicine | Admitting: Emergency Medicine

## 2012-01-21 ENCOUNTER — Encounter (HOSPITAL_COMMUNITY): Payer: Self-pay | Admitting: Emergency Medicine

## 2012-01-21 ENCOUNTER — Emergency Department (HOSPITAL_COMMUNITY): Payer: PRIVATE HEALTH INSURANCE

## 2012-01-21 DIAGNOSIS — G8929 Other chronic pain: Secondary | ICD-10-CM | POA: Insufficient documentation

## 2012-01-21 DIAGNOSIS — N186 End stage renal disease: Secondary | ICD-10-CM | POA: Insufficient documentation

## 2012-01-21 DIAGNOSIS — Z8249 Family history of ischemic heart disease and other diseases of the circulatory system: Secondary | ICD-10-CM | POA: Insufficient documentation

## 2012-01-21 DIAGNOSIS — Z8489 Family history of other specified conditions: Secondary | ICD-10-CM | POA: Insufficient documentation

## 2012-01-21 DIAGNOSIS — E119 Type 2 diabetes mellitus without complications: Secondary | ICD-10-CM | POA: Insufficient documentation

## 2012-01-21 DIAGNOSIS — E669 Obesity, unspecified: Secondary | ICD-10-CM | POA: Insufficient documentation

## 2012-01-21 DIAGNOSIS — M549 Dorsalgia, unspecified: Secondary | ICD-10-CM | POA: Insufficient documentation

## 2012-01-21 DIAGNOSIS — H81399 Other peripheral vertigo, unspecified ear: Secondary | ICD-10-CM

## 2012-01-21 DIAGNOSIS — Z800 Family history of malignant neoplasm of digestive organs: Secondary | ICD-10-CM | POA: Insufficient documentation

## 2012-01-21 DIAGNOSIS — Z833 Family history of diabetes mellitus: Secondary | ICD-10-CM | POA: Insufficient documentation

## 2012-01-21 DIAGNOSIS — I120 Hypertensive chronic kidney disease with stage 5 chronic kidney disease or end stage renal disease: Secondary | ICD-10-CM | POA: Insufficient documentation

## 2012-01-21 DIAGNOSIS — Z808 Family history of malignant neoplasm of other organs or systems: Secondary | ICD-10-CM | POA: Insufficient documentation

## 2012-01-21 LAB — CBC
HCT: 31.2 % — ABNORMAL LOW (ref 39.0–52.0)
RDW: 14.4 % (ref 11.5–15.5)
WBC: 7.7 10*3/uL (ref 4.0–10.5)

## 2012-01-21 LAB — BASIC METABOLIC PANEL
BUN: 14 mg/dL (ref 6–23)
Chloride: 101 mEq/L (ref 96–112)
GFR calc Af Amer: 14 mL/min — ABNORMAL LOW (ref >90)
Potassium: 3.6 mEq/L (ref 3.5–5.1)

## 2012-01-21 NOTE — ED Notes (Signed)
Cannot do vital signs with patient standing due to he is wheelchair bound.  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

## 2012-01-21 NOTE — ED Notes (Signed)
Patient complaining of dizziness and vertigo symptoms for approximately two weeks. States had dialysis today at 0620.

## 2012-01-21 NOTE — ED Provider Notes (Signed)
History   This chart was scribed for Tobin Chad, MD by Toya Smothers. The patient was seen in room APA15/APA15. Patient's care was started at 1909.  CSN: [id number]  Arrival date & time 01/21/12  1909   First MD Initiated Contact with Patient 01/21/12 1936      Chief Complaint  Patient presents with  . Dizziness   The history is provided by the patient. No language interpreter was used.    Julian [name] is a 65 y.o. male with a h/o diabetes, hypertension who presents to the Emergency Department complaining of 14 hours of  sudden onset moderate dizziness after undergoing dialysis. Pt reports that he was feeling well until after the completion of dialysis. 2 hours later he became nauseated and vomited once. Pt endorses associated light-headedness - described as "the world was moving and a ringing in my ears." Symptoms are new, unlike any before. Pt denies chest pain, SOB, tachycardia, fever, cough, abdominal pain, and diarrhea.  He also denies unilateral weakness.   Past Medical History  Diagnosis Date  . Hypertension   . Diabetes mellitus   . Peripheral vascular disease   . Obesity   . End stage renal disease     on Hemodialysis  . ESRD on hemodialysis   . Chronic back pain     Past Surgical History  Procedure Date  . Above knee leg amputation     Right AKA  . Arteriovenous graft placement   . Pr vein bypass graft,aorto-fem-pop   . Insertion of dialysis catheter 07/30/2011    Procedure: INSERTION OF DIALYSIS CATHETER;  Surgeon: Larina Earthly, MD;  Location: [facility] OR;  Service: Vascular;  Laterality: Right;  removal of right dialysis catheter and exchange/ insertion of new right dialysis catheter  . Colonoscopy Dec 2007    RMR: normal rectum, colonic polyps with ascending colon polyp actively oozing, s/p hot snare polypectomy: tubulovillous adenoma, adenomatous polyps   . Av fistula placement   . Bascilic vein transposition 08/24/2011    Left upper arm  . Eye surgery    'not sure what kind'  . Av fistula placement 01/04/2012    Procedure: INSERTION OF ARTERIOVENOUS (AV) GORE-TEX GRAFT ARM;  Surgeon: Fransisco Hertz, MD;  Location: [facility] OR;  Service: Vascular;  Laterality: Left;  4 x 7mm stretch graft implanted in left upper arm    Family History  Problem Relation Age of Onset  . Hypertension Mother   . Diabetes Father   . Anesthesia problems Neg Hx   . Colon cancer Neg Hx     History  Substance Use Topics  . Smoking status: Never Smoker   . Smokeless tobacco: Never Used  . Alcohol Use: No      Review of Systems  Constitutional: Negative for fever, chills, activity change, appetite change and fatigue.  HENT: Positive for tinnitus. Negative for hearing loss, ear pain, congestion, rhinorrhea, drooling, trouble swallowing and neck pain.   Eyes: Negative.  Negative for pain and visual disturbance.  Respiratory: Negative for apnea, cough, chest tightness and shortness of breath.   Cardiovascular: Negative for chest pain, palpitations and leg swelling.  Gastrointestinal: Positive for vomiting. Negative for nausea, abdominal pain and diarrhea.  Genitourinary: Negative.  Negative for dysuria.  Musculoskeletal: Negative for myalgias and back pain.  Skin: Negative for rash and wound.  Neurological: Positive for dizziness and light-headedness. Negative for tremors, syncope, facial asymmetry, speech difficulty, weakness, numbness and headaches.  Hematological: Negative.   Psychiatric/Behavioral: Negative.  Allergies  Review of patient's allergies indicates no known allergies.  Home Medications   Current Outpatient Rx  Name Route Sig Dispense Refill  . CINACALCET HCL 60 MG PO TABS Oral Take 120 mg by mouth daily. Takes 2 tablets at lunch    . DILTIAZEM HCL ER COATED BEADS 180 MG PO CP24 Oral Take 180 mg by mouth daily.     Marland Kitchen DIPHENOXYLATE-ATROPINE 2.5-0.025 MG PO TABS Oral Take 2 tablets by mouth daily as needed. FOR DIARRHEA.    Marland Kitchen HYDRALAZINE HCL 50  MG PO TABS Oral Take 50 mg by mouth 3 (three) times daily.     . INSULIN GLARGINE 100 UNIT/ML [place] SOLN Subcutaneous Inject 10-20 Units into the skin 2 (two) times daily. Patient takes 20 units in the morning and 10 units at bedtime    . LORATADINE 10 MG PO TABS Oral Take 10 mg by mouth daily.    Marland Kitchen RENA-VITE PO TABS Oral Take 1 tablet by mouth daily.    . OXYCODONE HCL 5 MG PO TABS Oral Take 5 mg by mouth every 4 (four) hours as needed. pain    . SEVELAMER HCL 800 MG PO TABS Oral Take 2,400-4,000 mg by mouth 2 (two) times daily with a meal. Take 5 tabs (4000mg ) 2 times daily with meals and 3 tabs (2400mg ) as needed with snacks.      BP 147/76  Pulse 82  Temp 98.1 F (36.7 C) (Oral)  Resp 14  Ht 5\' 10"  (1.778 m)  Wt 251 lb (113.853 kg)  BMI 36.01 kg/m2  SpO2 97%  Physical Exam  Constitutional: He is oriented to person, place, and time. He appears well-developed and well-nourished. No distress.  HENT:  Head: Normocephalic and atraumatic.  Right Ear: External ear normal.  Left Ear: External ear normal.  Nose: Nose normal.  Mouth/Throat: Oropharynx is clear and moist. No oropharyngeal exudate.       Poor dentition.  Tongue is midline. Throat is clear. No breves.  Eyes: Conjunctivae normal and EOM are normal. Pupils are equal, round, and reactive to light. Right eye exhibits no discharge. Left eye exhibits no discharge. No scleral icterus.  Neck: Normal range of motion. Neck supple. No JVD present. Carotid bruit is not present. Erythema present. No tracheal deviation present. No thyromegaly present.  Cardiovascular: Normal rate, regular rhythm, normal heart sounds and intact distal pulses.  Exam reveals no gallop and no friction rub.   No murmur heard. Pulmonary/Chest: Effort normal and breath sounds normal. No stridor. No respiratory distress. He has no wheezes. He has no rales. He exhibits no tenderness.  Abdominal: Soft. Bowel sounds are normal. He exhibits no distension and no mass.  There is no tenderness. There is no rebound and no guarding.       Obese, protuberant  Musculoskeletal: Normal range of motion. He exhibits no edema and no tenderness.       Intact symmetric strength.  + rt AKA.  Lymphadenopathy:    He has no cervical adenopathy.  Neurological: He is alert and oriented to person, place, and time. He has normal strength. He displays no atrophy and no tremor. No cranial nerve deficit or sensory deficit. He exhibits normal muscle tone. He displays no seizure activity. Coordination normal. GCS eye subscore is 4. GCS verbal subscore is 5. GCS motor subscore is 6.       No pronator drift. Normal finger to nose bilaterally.   Skin: Skin is warm and dry. No rash noted. He  is not diaphoretic. No erythema. No pallor.  Psychiatric: He has a normal mood and affect. His behavior is normal.    ED Course  Procedures (including critical care time) DIAGNOSTIC STUDIES: Oxygen Saturation is 98% on room air, normal by my interpretation.    COORDINATION OF CARE: 19:57- Evaluated Pt. Pt is awake and alert.    Labs Reviewed  CBC - Abnormal; Notable for the following:    RBC 3.21 (*)     Hemoglobin 10.1 (*)     HCT 31.2 (*)     Platelets 144 (*)     All other components within normal limits  BASIC METABOLIC PANEL - Abnormal; Notable for the following:    Glucose, Bld 170 (*)     Creatinine, Ser 4.76 (*)     GFR calc non Af Amer 12 (*)     GFR calc Af Amer 14 (*)     All other components within normal limits   Ct Head Wo Contrast  01/21/2012  *RADIOLOGY REPORT*  Clinical Data: 2-week history of dizziness.  History of hypertension and diabetes.  CT HEAD WITHOUT CONTRAST  Technique:  Contiguous axial images were obtained from the base of the skull through the vertex without contrast.  Comparison: None.  Findings: Encephalomalacia involving the right occipital lobe. Mild to moderate cortical and cerebellar atrophy. Ventricular system normal in size and appearance for age.   Physiologic calcifications in the basal ganglia.  No mass lesion.  No midline shift.  No acute hemorrhage or hematoma.  No extra-axial fluid collections.  No evidence of acute infarction.  No skull fracture or other focal osseous abnormality involving the skull.  Mucous retention cyst or polyp in the left maxillary sinus and mild mucosal thickening in the right maxillary sinus. Remaining visualized paranasal sinuses, both mastoid air cells, and both middle ear cavities well-aerated.  Extensive bilateral carotid siphon vertebral artery atherosclerosis.  IMPRESSION:  1.  No acute intracranial abnormality. 2.  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 3.  Mild to moderate cortical and cerebellar atrophy. 4.  Chronic bilateral maxillary sinusitis.   Original Report Authenticated By: Arnell Sieving, M.D.      No diagnosis found.   Date: 01/21/2012  Rate: 81 bpm  Rhythm: normal sinus rhythm  QRS Axis: normal  Intervals: normal  ST/T Wave abnormalities: nonspecific ST changes  Conduction Disutrbances:none  Narrative Interpretation:   Old EKG Reviewed: unchanged      MDM  Pt presents for evaluation of lightheadedness which is not currently occurring.  It has been a sporadic issue over the last 2 weeks.  He describes a feeling as if the room is spinning or a sensation of mvmnt associated with ringing in the ears.  Note stable VS, NAD.  Pt has no evidence of CVA on exam.  Plan ER obs, CT of head, basic labs, and orthostatic VS.  Pt has risk factors for CVA and chronic anemia as well as electrolyte disturbances.  He received dialysis this morning.  2130.  Pt stable, NAD.  No clinical evidence of CVA.  Neg orthostatic VS.  S/s have not occurred while here in ER.  Plan D/C home.  He has an appt with his PMD on 9/16.  By hx, his s/s appear consistent with peripheral vertigo.   I personally performed the services described in this documentation, which was scribed in my presence. The  recorded information has been reviewed and considered.       Tobin Chad, MD  01/22/12 0036 

## 2012-01-21 NOTE — ED Notes (Signed)
Patient placed on cardiac monitor.

## 2012-01-22 MED ORDER — MECLIZINE HCL 25 MG PO TABS: 25.0000 mg | ORAL_TABLET | Freq: Three times a day (TID) | ORAL | Status: AC | PRN

## 2012-01-24 ENCOUNTER — Ambulatory Visit (INDEPENDENT_AMBULATORY_CARE_PROVIDER_SITE_OTHER): Payer: PRIVATE HEALTH INSURANCE | Admitting: Family Medicine

## 2012-01-24 ENCOUNTER — Encounter: Payer: Self-pay | Admitting: Family Medicine

## 2012-01-24 VITALS — BP 150/84 | HR 78 | Resp 16 | Ht 70.0 in

## 2012-01-24 DIAGNOSIS — M549 Dorsalgia, unspecified: Secondary | ICD-10-CM

## 2012-01-24 DIAGNOSIS — R42 Dizziness and giddiness: Secondary | ICD-10-CM

## 2012-01-24 DIAGNOSIS — E119 Type 2 diabetes mellitus without complications: Secondary | ICD-10-CM
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
## 2012-01-24 NOTE — Patient Instructions (Signed)
Take your blood sugar every morning and in the evening If you feel sick check your blood sugar Take the dizzy medication as needed Call if you do not improve  Cancel appt for the 19th F/U 3 months

## 2012-01-25 ENCOUNTER — Telehealth: Payer: Self-pay | Admitting: Family Medicine

## 2012-01-26 ENCOUNTER — Telehealth: Payer: Self-pay

## 2012-01-26 DIAGNOSIS — R42 Dizziness and giddiness: Secondary | ICD-10-CM | POA: Insufficient documentation

## 2012-01-26 DIAGNOSIS — M549 Dorsalgia, unspecified: Secondary | ICD-10-CM | POA: Insufficient documentation

## 2012-01-26 MED ORDER — TRAMADOL HCL 50 MG PO TABS: 50.0000 mg | ORAL_TABLET | Freq: Two times a day (BID) | ORAL | Status: DC | PRN

## 2012-01-26 NOTE — Assessment & Plan Note (Signed)
Pt called after he was seen stating that he had low back pain, he is wheelchair bound with AKA. Will treat with ultram

## 2012-01-26 NOTE — Telephone Encounter (Signed)
Will send 1 prescription for tramadol for pain

## 2012-01-26 NOTE — Telephone Encounter (Signed)
Ultram sent

## 2012-01-26 NOTE — Telephone Encounter (Signed)
Patient aware.

## 2012-01-26 NOTE — Assessment & Plan Note (Signed)
He has been eating three meals a day which has improved symptoms prior to dialysis, no change to meds

## 2012-01-26 NOTE — Telephone Encounter (Signed)
Called back stating med wasn't sent in. Do you want me to send it for you? Just let me know strength/directions

## 2012-01-26 NOTE — Telephone Encounter (Signed)
Aware. 

## 2012-01-26 NOTE — Assessment & Plan Note (Signed)
Positional vertigo, improved with meclizine

## 2012-01-27 ENCOUNTER — Encounter: Payer: Self-pay | Admitting: Vascular Surgery

## 2012-01-27 ENCOUNTER — Telehealth: Payer: Self-pay | Admitting: Family Medicine

## 2012-01-27 ENCOUNTER — Other Ambulatory Visit: Payer: Self-pay

## 2012-01-27 ENCOUNTER — Ambulatory Visit: Payer: PRIVATE HEALTH INSURANCE | Admitting: Family Medicine

## 2012-01-27 MED ORDER — HYDROCODONE-ACETAMINOPHEN 5-500 MG PO TABS: 1.0000 | ORAL_TABLET | ORAL | Status: DC | PRN

## 2012-01-27 NOTE — Telephone Encounter (Signed)
Please advise 

## 2012-01-27 NOTE — Telephone Encounter (Signed)
I spoke with pt, he has spondylosis noted on xray he has been evaluated in past for chronic back pain, narcotic database was negative, though his pharmacy was able to tell me he has had vicodin filled by ER multiple times this year, his previous PCP none since 2012. Note his nurse Aid " Ms. Ewing??" called and was very rude to my nursing staff, demanding he get oxycodone and tell nurse  "she needs to get it together" and " dont make her come up here". I called the cell phone number given by Tommy for his nurse [phone] and left a voice mail, tommy actually could not spell the  Aide's name and does not know what company she works for. With her threatening tone she will not be allowed on our premises.  Note she has never come to appt with tommy in this office  Ultram did not help pt, will given 30 tablets of vicodin,

## 2012-01-28 ENCOUNTER — Ambulatory Visit: Payer: PRIVATE HEALTH INSURANCE | Admitting: Vascular Surgery

## 2012-02-07 ENCOUNTER — Telehealth: Payer: Self-pay | Admitting: Family Medicine

## 2012-02-07 MED ORDER — DICYCLOMINE HCL 10 MG PO CAPS: 10.0000 mg | ORAL_CAPSULE | Freq: Three times a day (TID) | ORAL | Status: AC

## 2012-02-07 NOTE — Telephone Encounter (Signed)
Im assuming that he is talking about lomotil.

## 2012-02-07 NOTE — Telephone Encounter (Signed)
He can stop the lomitil and go back Bentyl, medication sent in. If this does not improve he needs to call and make appointment with Dr. Luvenia Starch office as they have seen him for this complaint

## 2012-02-08 ENCOUNTER — Telehealth: Payer: Self-pay | Admitting: Family Medicine

## 2012-02-08 DIAGNOSIS — M79606 Pain in leg, unspecified: Secondary | ICD-10-CM

## 2012-02-08 HISTORY — DX: Pain in leg, unspecified: M79.606

## 2012-02-10 NOTE — Telephone Encounter (Signed)
PA has been started and they are faxing PA form over

## 2012-02-11 ENCOUNTER — Telehealth: Payer: Self-pay | Admitting: Family Medicine

## 2012-02-11 NOTE — Telephone Encounter (Signed)
Spoke with patient and let him know that we are waiting for approval from the insurance.

## 2012-02-11 NOTE — Telephone Encounter (Signed)
Patient aware.  He knows that we are waiting on approval from insurance company.

## 2012-02-14 ENCOUNTER — Telehealth: Payer: Self-pay | Admitting: Family Medicine

## 2012-02-14 ENCOUNTER — Other Ambulatory Visit: Payer: Self-pay | Admitting: Family Medicine

## 2012-02-14 NOTE — Telephone Encounter (Signed)
Spoke with patient and let him know that we are waiting for response from insurance company.  Also let him know that he should scheduled appointment with GI.

## 2012-02-14 NOTE — Telephone Encounter (Signed)
Additional information faxed to insurance company this am.

## 2012-02-17 ENCOUNTER — Emergency Department (HOSPITAL_COMMUNITY): Payer: PRIVATE HEALTH INSURANCE

## 2012-02-17 ENCOUNTER — Encounter: Payer: Self-pay | Admitting: Vascular Surgery

## 2012-02-17 ENCOUNTER — Emergency Department (HOSPITAL_COMMUNITY)
Admission: EM | Admit: 2012-02-17 | Discharge: 2012-02-17 | Disposition: A | Discharge status: Home / Self Care | Payer: PRIVATE HEALTH INSURANCE | Attending: Emergency Medicine | Admitting: Emergency Medicine

## 2012-02-17 ENCOUNTER — Other Ambulatory Visit: Payer: Self-pay | Admitting: Family Medicine

## 2012-02-17 ENCOUNTER — Encounter (HOSPITAL_COMMUNITY): Payer: Self-pay | Admitting: *Deleted

## 2012-02-17 DIAGNOSIS — E119 Type 2 diabetes mellitus without complications: Secondary | ICD-10-CM | POA: Insufficient documentation

## 2012-02-17 DIAGNOSIS — I739 Peripheral vascular disease, unspecified: Secondary | ICD-10-CM | POA: Insufficient documentation

## 2012-02-17 DIAGNOSIS — E669 Obesity, unspecified: Secondary | ICD-10-CM | POA: Insufficient documentation

## 2012-02-17 DIAGNOSIS — M549 Dorsalgia, unspecified: Secondary | ICD-10-CM | POA: Insufficient documentation

## 2012-02-17 DIAGNOSIS — G8929 Other chronic pain: Secondary | ICD-10-CM | POA: Insufficient documentation

## 2012-02-17 DIAGNOSIS — I120 Hypertensive chronic kidney disease with stage 5 chronic kidney disease or end stage renal disease: Secondary | ICD-10-CM | POA: Insufficient documentation

## 2012-02-17 DIAGNOSIS — N186 End stage renal disease: Secondary | ICD-10-CM | POA: Insufficient documentation

## 2012-02-17 DIAGNOSIS — H109 Unspecified conjunctivitis: Secondary | ICD-10-CM

## 2012-02-17 MED ORDER — FLUORESCEIN SODIUM 1 MG OP STRP
1.0000 | ORAL_STRIP | Freq: Once | OPHTHALMIC | Status: AC
  Administered 2012-02-17: 1 via OPHTHALMIC
  Filled 2012-02-17: qty 1

## 2012-02-17 MED ORDER — ERYTHROMYCIN 5 MG/GM OP OINT
TOPICAL_OINTMENT | Freq: Once | OPHTHALMIC | Status: AC
  Administered 2012-02-17: 22:00:00 via OPHTHALMIC
  Filled 2012-02-17: qty 3.5

## 2012-02-17 MED ORDER — OXYCODONE-ACETAMINOPHEN 5-325 MG PO TABS: ORAL_TABLET | ORAL | Status: DC

## 2012-02-17 NOTE — ED Notes (Signed)
Chronic back and side pain which has exacerbated over last 2 days.  Takes hydrocodone at home but ran out couple weeks ago.

## 2012-02-17 NOTE — ED Notes (Signed)
C/O lower right lateral back pain.  Patient also has reddened R eye w/swelling and slight drainage.  Pt. Reports it was crusted shut and it itches and burns.

## 2012-02-17 NOTE — ED Notes (Signed)
Patient with no complaints at this time. Respirations even and unlabored. Skin warm/dry. Discharge instructions reviewed with patient at this time. Patient given opportunity to voice concerns/ask questions. Patient discharged at this time and left Emergency Department with steady gait.   
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
## 2012-02-18 ENCOUNTER — Ambulatory Visit (INDEPENDENT_AMBULATORY_CARE_PROVIDER_SITE_OTHER): Payer: PRIVATE HEALTH INSURANCE | Admitting: Vascular Surgery

## 2012-02-18 ENCOUNTER — Encounter: Payer: Self-pay | Admitting: Vascular Surgery

## 2012-02-18 VITALS — BP 137/73 | HR 84 | Ht 70.0 in | Wt 180.0 lb

## 2012-02-18 DIAGNOSIS — N186 End stage renal disease: Secondary | ICD-10-CM

## 2012-02-18 NOTE — Progress Notes (Signed)
[facility] [place]  Postoperative Access Visit  History of Present Illness  Julian [name] is a 66 y.o. year old male who presents for postoperative follow-up for: LUA AVG (Date: 01/04/12).  The patient's wounds are healed.  The patient notes no steal symptoms.  The patient is able to complete their activities of daily living.  The patient's current symptoms are: none.  Physical Examination  Filed Vitals:   02/18/12 1533  BP: 137/73  Pulse: 84   LUE: Incision is healed, skin feels warm, hand grip is 5/5, sensation in digits is intact, palpable thrill, bruit can  be auscultated   Medical Decision Making  Julian [name] is a 66 y.o. year old male who presents s/p LUA AVG.  The patient's access is ready for use.  The patient's tunneled dialysis catheter can be removed after two successful cannulations and completed dialysis treatments.  Thank you for allowing [place] to participate in this patient's care.  [name], MD [facility] [place] Office: [phone] Pager: [phone]

## 2012-02-22 ENCOUNTER — Encounter: Payer: Self-pay | Admitting: Family Medicine

## 2012-02-22 ENCOUNTER — Ambulatory Visit: Payer: PRIVATE HEALTH INSURANCE | Admitting: Gastroenterology

## 2012-02-22 ENCOUNTER — Telehealth: Payer: Self-pay | Admitting: Gastroenterology

## 2012-02-22 ENCOUNTER — Ambulatory Visit (INDEPENDENT_AMBULATORY_CARE_PROVIDER_SITE_OTHER): Payer: PRIVATE HEALTH INSURANCE | Admitting: Family Medicine

## 2012-02-22 VITALS — BP 160/84 | HR 89 | Resp 15

## 2012-02-22 DIAGNOSIS — I10 Essential (primary) hypertension: Secondary | ICD-10-CM

## 2012-02-22 DIAGNOSIS — M549 Dorsalgia, unspecified: Secondary | ICD-10-CM

## 2012-02-22 DIAGNOSIS — J069 Acute upper respiratory infection, unspecified: Secondary | ICD-10-CM

## 2012-02-22 DIAGNOSIS — H109 Unspecified conjunctivitis: Secondary | ICD-10-CM

## 2012-02-22 DIAGNOSIS — R197 Diarrhea, unspecified: Secondary | ICD-10-CM

## 2012-02-22 MED ORDER — HYDROCODONE-ACETAMINOPHEN 10-500 MG PO TABS: 1.0000 | ORAL_TABLET | Freq: Two times a day (BID) | ORAL | Status: DC | PRN

## 2012-02-22 NOTE — Patient Instructions (Addendum)
You have been placed on a pain contract New pain medicine Vicodin 10mg  twice a day as needed Stop the eye drops on Thursday  Complete antibiotics  Keep Previous f/u appt

## 2012-02-22 NOTE — Telephone Encounter (Signed)
noted 

## 2012-02-22 NOTE — Telephone Encounter (Signed)
Pt was a no show

## 2012-02-23 ENCOUNTER — Encounter: Payer: Self-pay | Admitting: Family Medicine

## 2012-02-23 DIAGNOSIS — H109 Unspecified conjunctivitis: Secondary | ICD-10-CM | POA: Insufficient documentation

## 2012-02-23 DIAGNOSIS — J069 Acute upper respiratory infection, unspecified: Secondary | ICD-10-CM | POA: Insufficient documentation

## 2012-02-23 NOTE — Assessment & Plan Note (Signed)
Due for HD, elevated today

## 2012-02-23 NOTE — Assessment & Plan Note (Signed)
Previous diarrhea, now resolved, has appt with GI, using bentyl

## 2012-02-23 NOTE — Assessment & Plan Note (Signed)
Not ill appearing, lung exam clear, complete last day of antibiotics

## 2012-02-23 NOTE — Assessment & Plan Note (Signed)
Now resolved, advised pt 2 more days of drops then stop
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
## 2012-02-28 ENCOUNTER — Encounter (HOSPITAL_COMMUNITY): Payer: Self-pay | Admitting: *Deleted

## 2012-02-28 ENCOUNTER — Emergency Department (HOSPITAL_COMMUNITY)
Admission: EM | Admit: 2012-02-28 | Discharge: 2012-02-28 | Disposition: A | Discharge status: Home / Self Care | Payer: PRIVATE HEALTH INSURANCE | Attending: Emergency Medicine | Admitting: Emergency Medicine

## 2012-02-28 DIAGNOSIS — I120 Hypertensive chronic kidney disease with stage 5 chronic kidney disease or end stage renal disease: Secondary | ICD-10-CM | POA: Insufficient documentation

## 2012-02-28 DIAGNOSIS — Z79899 Other long term (current) drug therapy: Secondary | ICD-10-CM | POA: Insufficient documentation

## 2012-02-28 DIAGNOSIS — Z794 Long term (current) use of insulin: Secondary | ICD-10-CM | POA: Insufficient documentation

## 2012-02-28 DIAGNOSIS — M545 Low back pain, unspecified: Secondary | ICD-10-CM | POA: Insufficient documentation

## 2012-02-28 DIAGNOSIS — M549 Dorsalgia, unspecified: Secondary | ICD-10-CM

## 2012-02-28 DIAGNOSIS — I739 Peripheral vascular disease, unspecified: Secondary | ICD-10-CM | POA: Insufficient documentation

## 2012-02-28 DIAGNOSIS — E119 Type 2 diabetes mellitus without complications: Secondary | ICD-10-CM | POA: Insufficient documentation

## 2012-02-28 DIAGNOSIS — Z992 Dependence on renal dialysis: Secondary | ICD-10-CM | POA: Insufficient documentation

## 2012-02-28 DIAGNOSIS — S78119A Complete traumatic amputation at level between unspecified hip and knee, initial encounter: Secondary | ICD-10-CM | POA: Insufficient documentation

## 2012-02-28 DIAGNOSIS — E669 Obesity, unspecified: Secondary | ICD-10-CM | POA: Insufficient documentation

## 2012-02-28 MED ORDER — OXYCODONE-ACETAMINOPHEN 5-325 MG PO TABS: 1.0000 | ORAL_TABLET | Freq: Four times a day (QID) | ORAL | Status: DC | PRN
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
## 2012-03-31 ENCOUNTER — Ambulatory Visit (INDEPENDENT_AMBULATORY_CARE_PROVIDER_SITE_OTHER): Payer: PRIVATE HEALTH INSURANCE | Admitting: Family Medicine

## 2012-03-31 ENCOUNTER — Encounter: Payer: Self-pay | Admitting: Family Medicine

## 2012-03-31 VITALS — BP 150/80 | HR 81 | Resp 15

## 2012-03-31 DIAGNOSIS — M25519 Pain in unspecified shoulder: Secondary | ICD-10-CM

## 2012-03-31 DIAGNOSIS — M549 Dorsalgia, unspecified: Secondary | ICD-10-CM

## 2012-03-31 NOTE — Patient Instructions (Signed)
MRI to be done of back You are on pain contract  Do not go to the ER for your regular back pain  Get your pain pills from the pharmacy  F/U 3 months  Remove December appointment

## 2012-04-02 DIAGNOSIS — M25519 Pain in unspecified shoulder: Secondary | ICD-10-CM | POA: Insufficient documentation

## 2012-04-02 NOTE — Assessment & Plan Note (Signed)
No acute injury, no swelling graft functioned well this morning. Will monitor for now
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
## 2012-04-02 NOTE — Progress Notes (Signed)
  Subjective:    Patient ID: Julian [name], male    DOB: 25-Aug-1945, 66 y.o.   MRN: [id number]  HPI  Chronic back pain has been back and forth to ER asking for pain meds, pain radiates down left leg, denies phatom limb pain, in wheelchair most of day  Right arm pain near site of graft for past 2 days, no swelling, no bruising   Review of Systems  GEN- denies fatigue, fever, weight loss,weakness, recent illness CVS- denies chest pain, palpitations RESP- denies SOB, cough, wheeze MSK- + joint pain, muscle aches, injury       Objective:   Physical Exam GEN-NAD,alert and oriented, in wheelchair Back- TTP lumbar spine, no spasm, +slr on left side, unable to evaluate ROM Right AKA Shoulder- equal ROM, mild TTP near graft site, +bruit, +thrill       Assessment & Plan:

## 2012-04-03 ENCOUNTER — Telehealth: Payer: Self-pay | Admitting: Family Medicine

## 2012-04-03 NOTE — Telephone Encounter (Signed)
precert # [id number]

## 2012-04-11 ENCOUNTER — Ambulatory Visit (HOSPITAL_COMMUNITY)
Admission: RE | Admit: 2012-04-11 | Discharge: 2012-04-11 | Disposition: A | Discharge status: Home / Self Care | Payer: PRIVATE HEALTH INSURANCE | Source: Ambulatory Visit | Attending: Family Medicine | Admitting: Family Medicine

## 2012-04-11 DIAGNOSIS — M479 Spondylosis, unspecified: Secondary | ICD-10-CM | POA: Insufficient documentation

## 2012-04-11 DIAGNOSIS — E119 Type 2 diabetes mellitus without complications: Secondary | ICD-10-CM | POA: Insufficient documentation

## 2012-04-11 DIAGNOSIS — M549 Dorsalgia, unspecified: Secondary | ICD-10-CM

## 2012-04-17 ENCOUNTER — Other Ambulatory Visit: Payer: Self-pay | Admitting: Family Medicine

## 2012-04-17 ENCOUNTER — Other Ambulatory Visit: Payer: Self-pay

## 2012-04-17 MED ORDER — INSULIN GLARGINE 100 UNIT/ML ~~LOC~~ SOLN: 10.0000 [IU] | Freq: Two times a day (BID) | SUBCUTANEOUS | Status: DC

## 2012-04-25 ENCOUNTER — Ambulatory Visit: Payer: PRIVATE HEALTH INSURANCE | Admitting: Family Medicine

## 2012-04-28 ENCOUNTER — Other Ambulatory Visit: Payer: Self-pay | Admitting: Family Medicine

## 2012-05-01 ENCOUNTER — Other Ambulatory Visit: Payer: Self-pay

## 2012-05-02 ENCOUNTER — Other Ambulatory Visit: Payer: Self-pay

## 2012-05-02 MED ORDER — HYDROCODONE-ACETAMINOPHEN 10-325 MG PO TABS: 1.0000 | ORAL_TABLET | Freq: Two times a day (BID) | ORAL | Status: DC

## 2012-05-25 ENCOUNTER — Telehealth: Payer: Self-pay | Admitting: Family Medicine

## 2012-05-25 ENCOUNTER — Other Ambulatory Visit: Payer: Self-pay

## 2012-05-25 MED ORDER — HYDROCODONE-ACETAMINOPHEN 10-325 MG PO TABS: 1.0000 | ORAL_TABLET | Freq: Two times a day (BID) | ORAL | Status: DC

## 2012-05-25 NOTE — Telephone Encounter (Signed)
Okay to fill? 

## 2012-05-25 NOTE — Telephone Encounter (Signed)
Med refilled.

## 2012-05-25 NOTE — Telephone Encounter (Signed)
Patient last filled on 12/20.  If it is ok for him to have today will you add "Ok to fill" on the rx.  He states that he is having increased leg pain.

## 2012-05-30 ENCOUNTER — Telehealth: Payer: Self-pay | Admitting: Family Medicine

## 2012-05-30 NOTE — Telephone Encounter (Signed)
Please call pt and triage, to see what he needs

## 2012-05-31 NOTE — Telephone Encounter (Signed)
Called patient no answer. No option to leave message

## 2012-06-01 ENCOUNTER — Ambulatory Visit: Payer: PRIVATE HEALTH INSURANCE | Admitting: Family Medicine

## 2012-06-20 ENCOUNTER — Encounter: Payer: Self-pay | Admitting: Family Medicine

## 2012-06-20 ENCOUNTER — Ambulatory Visit (INDEPENDENT_AMBULATORY_CARE_PROVIDER_SITE_OTHER): Payer: PRIVATE HEALTH INSURANCE | Admitting: Family Medicine

## 2012-06-20 VITALS — BP 186/84 | HR 97 | Resp 18 | Ht 70.0 in | Wt 257.0 lb

## 2012-06-20 DIAGNOSIS — N186 End stage renal disease: Secondary | ICD-10-CM

## 2012-06-20 DIAGNOSIS — Z125 Encounter for screening for malignant neoplasm of prostate: Secondary | ICD-10-CM

## 2012-06-20 DIAGNOSIS — Z992 Dependence on renal dialysis: Secondary | ICD-10-CM

## 2012-06-20 DIAGNOSIS — H547 Unspecified visual loss: Secondary | ICD-10-CM

## 2012-06-20 DIAGNOSIS — Z0000 Encounter for general adult medical examination without abnormal findings: Secondary | ICD-10-CM

## 2012-06-20 DIAGNOSIS — Z1211 Encounter for screening for malignant neoplasm of colon: Secondary | ICD-10-CM

## 2012-06-20 DIAGNOSIS — Z89619 Acquired absence of unspecified leg above knee: Secondary | ICD-10-CM | POA: Insufficient documentation

## 2012-06-20 DIAGNOSIS — S78119A Complete traumatic amputation at level between unspecified hip and knee, initial encounter: Secondary | ICD-10-CM

## 2012-06-20 DIAGNOSIS — E119 Type 2 diabetes mellitus without complications: Secondary | ICD-10-CM

## 2012-06-20 DIAGNOSIS — I10 Essential (primary) hypertension: Secondary | ICD-10-CM

## 2012-06-20 LAB — BASIC METABOLIC PANEL
BUN: 24 mg/dL — ABNORMAL HIGH (ref 6–23)
Chloride: 103 mEq/L (ref 96–112)
Potassium: 4.7 mEq/L (ref 3.5–5.3)
Sodium: 141 mEq/L (ref 135–145)

## 2012-06-20 LAB — CBC
HCT: 36.1 % — ABNORMAL LOW (ref 39.0–52.0)
Hemoglobin: 11.7 g/dL — ABNORMAL LOW (ref 13.0–17.0)
MCH: 29.9 pg (ref 26.0–34.0)
MCV: 92.3 fL (ref 78.0–100.0)
RBC: 3.91 MIL/uL — ABNORMAL LOW (ref 4.22–5.81)

## 2012-06-20 LAB — LIPID PANEL
HDL: 38 mg/dL — ABNORMAL LOW (ref >39)
LDL Cholesterol: 79 mg/dL (ref 0–99)
Total CHOL/HDL Ratio: 3.4 Ratio
Triglycerides: 68 mg/dL (ref <150)
VLDL: 14 mg/dL (ref 0–40)

## 2012-06-20 LAB — PSA, MEDICARE: PSA: 1.34 ng/mL (ref <=4.00)

## 2012-06-20 NOTE — Assessment & Plan Note (Signed)
No meds taken today, defer to renal, he also has HD in morning

## 2012-06-20 NOTE — Assessment & Plan Note (Signed)
Followed by [facility], recent visit, obtain note

## 2012-06-20 NOTE — Assessment & Plan Note (Signed)
unchanged

## 2012-06-20 NOTE — Patient Instructions (Signed)
Get the labs done today Continue your medications Take your blood pressure medication today F/U 3 months

## 2012-06-20 NOTE — Assessment & Plan Note (Signed)
He continues to benefit from use of his motorized wheelchair he is due for a new padding For his chair I will send this in

## 2012-06-20 NOTE — Assessment & Plan Note (Signed)
lantus 10 units, he has had a few episodes of hypoglycemia, but no cbg taken, he had symptoms suggesting so, check A1C today, we may be able to stop the lantus all together

## 2012-06-20 NOTE — Addendum Note (Signed)
Addended by: Kandis Fantasia B on: 06/20/2012 04:55 PM   Modules accepted: Orders

## 2012-06-20 NOTE — Progress Notes (Signed)
Subjective:    Patient ID: Julian [name], male    DOB: Jan 02, 1946, 67 y.o.   MRN: [id number]  HPI   Subjective:   Patient presents for Medicare Annual/Subsequent preventive examination.   - no specific concerns, on hemodialysis  Review Past Medical/Family/Social: - revewiewed   Risk Factors  Current exercise habits: None- missing left lower limb Dietary issues discussed:   Cardiac risk factors: Obesity (BMI >= 30 kg/m2).   Depression Screen  (Note: if answer to either of the following is "Yes", a more complete depression screening is indicated)  Over the past two weeks, have you felt down, depressed or hopeless? No Over the past two weeks, have you felt little interest or pleasure in doing things? No Have you lost interest or pleasure in daily life? No Do you often feel hopeless? No Do you cry easily over simple problems? No   Activities of Daily Living  In your present state of health, do you have any difficulty performing the following activities?:  Driving? N/A  Managing money? No  Feeding yourself? No  Getting from bed to chair? No  Climbing a flight of stairs? n/a  Preparing food and eating?: No  Bathing or showering? No  Getting dressed: No  Getting to the toilet? No  Using the toilet:No  Moving around from place to place: No  In the past year have you fallen or had a near fall?:No  Are you sexually active? No  Do you have more than one partner? No   Hearing Difficulties: No  Do you often ask people to speak up or repeat themselves? No  Do you experience ringing or noises in your ears? No Do you have difficulty understanding soft or whispered voices? No  Do you feel that you have a problem with memory? No Do you often misplace items? No  Do you feel safe at home? Yes  Cognitive Testing  Alert? Yes Normal Appearance?Yes  Oriented to person? Yes Place? Yes  Time? Yes  Recall of three objects? Yes  Can perform simple calculations? Yes  Displays  appropriate judgment?Yes  Can read the correct time from a watch face?Yes   List the Names of Other Physician/Practitioners you currently use:  [facility]-  [facility]   Indicate any recent Medical Services you may have received from other than [facility] providers in the past year (date may be approximate).   Screening Tests / Date Colonoscopy- UTD                     Zostavax - pending Influenza Vaccine - UTD Tetanus/tdap- insurance not covered    Assessment:    Annual wellness medicare exam   Plan:    During the course of the visit the patient was educated and counseled about appropriate screening and preventive services including:   Colorectal cancer screening  Shingles vaccine. Prescription given to that she can get the vaccine at the pharmacy or Medicare part D.  Screen  Negative for depression.  Diet review for nutrition referral? Yes ____ Not Indicated __x__  Patient Instructions (the written plan) was given to the patient.  Medicare Attestation  I have personally reviewed:  The patient's medical and social history  Their use of alcohol, tobacco or illicit drugs  Their current medications and supplements  The patient's functional ability including ADLs,fall risks, home safety risks, cognitive, and hearing and visual impairment  Diet and physical activities  Evidence for depression or mood disorders  The patient's weight, height, BMI, and visual acuity have been recorded in the chart. I have made referrals, counseling, and provided education to the patient based on review of the above and I have provided the patient with a written personalized care plan for preventive services.        Review of Systems   GEN- denies fatigue, fever, weight loss,weakness, recent illness HEENT- denies eye drainage, change in vision, nasal discharge, CVS- denies chest pain, palpitations RESP- denies SOB, cough, wheeze ABD- denies N/V, change in stools,  abd pain GU- denies dysuria, hematuria, dribbling, incontinence MSK- + joint pain, muscle aches, injury Neuro- denies headache, dizziness, syncope, seizure activity      Objective:   Physical Exam  GEN- NAD, alert and oriented x3, sitting in wheelchair , obese, HEENT- PERRL, EOMI, non injected sclera,MMM, oropharynx clear,  CVS- RRR, no murmur RESP-CTAB ABD-NABS,soft,NT,ND  Rectum- normal external appearance, no skin tags, normal tone, soft brown stool in vault, difficult to feel prostate, smooth EXT-venous stasis changes   FOBT-Negative     Assessment & Plan:    Fasting labs to be done, PSA to be checked

## 2012-06-26 ENCOUNTER — Emergency Department (HOSPITAL_COMMUNITY)
Admission: EM | Admit: 2012-06-26 | Discharge: 2012-06-26 | Disposition: A | Discharge status: Home / Self Care | Payer: PRIVATE HEALTH INSURANCE | Attending: Emergency Medicine | Admitting: Emergency Medicine

## 2012-06-26 ENCOUNTER — Encounter (HOSPITAL_COMMUNITY): Payer: Self-pay | Admitting: *Deleted

## 2012-06-26 ENCOUNTER — Emergency Department (HOSPITAL_COMMUNITY): Payer: PRIVATE HEALTH INSURANCE

## 2012-06-26 DIAGNOSIS — Z79899 Other long term (current) drug therapy: Secondary | ICD-10-CM | POA: Insufficient documentation

## 2012-06-26 DIAGNOSIS — I120 Hypertensive chronic kidney disease with stage 5 chronic kidney disease or end stage renal disease: Secondary | ICD-10-CM | POA: Insufficient documentation

## 2012-06-26 DIAGNOSIS — E669 Obesity, unspecified: Secondary | ICD-10-CM | POA: Insufficient documentation

## 2012-06-26 DIAGNOSIS — E119 Type 2 diabetes mellitus without complications: Secondary | ICD-10-CM | POA: Insufficient documentation

## 2012-06-26 DIAGNOSIS — G8929 Other chronic pain: Secondary | ICD-10-CM | POA: Insufficient documentation

## 2012-06-26 DIAGNOSIS — N186 End stage renal disease: Secondary | ICD-10-CM | POA: Insufficient documentation

## 2012-06-26 DIAGNOSIS — R197 Diarrhea, unspecified: Secondary | ICD-10-CM | POA: Insufficient documentation

## 2012-06-26 DIAGNOSIS — Z992 Dependence on renal dialysis: Secondary | ICD-10-CM | POA: Insufficient documentation

## 2012-06-26 DIAGNOSIS — K529 Noninfective gastroenteritis and colitis, unspecified: Secondary | ICD-10-CM

## 2012-06-26 DIAGNOSIS — I739 Peripheral vascular disease, unspecified: Secondary | ICD-10-CM | POA: Insufficient documentation

## 2012-06-26 DIAGNOSIS — M549 Dorsalgia, unspecified: Secondary | ICD-10-CM | POA: Insufficient documentation

## 2012-06-26 DIAGNOSIS — Z794 Long term (current) use of insulin: Secondary | ICD-10-CM | POA: Insufficient documentation

## 2012-06-26 HISTORY — DX: Noninfective gastroenteritis and colitis, unspecified: K52.9

## 2012-06-26 LAB — COMPREHENSIVE METABOLIC PANEL
AST: 10 U/L (ref 0–37)
Albumin: 3.2 g/dL — ABNORMAL LOW (ref 3.5–5.2)
Chloride: 98 mEq/L (ref 96–112)
Creatinine, Ser: 4.41 mg/dL — ABNORMAL HIGH (ref 0.50–1.35)
Potassium: 3.6 mEq/L (ref 3.5–5.1)
Total Bilirubin: 0.2 mg/dL — ABNORMAL LOW (ref 0.3–1.2)
Total Protein: 7.1 g/dL (ref 6.0–8.3)

## 2012-06-26 LAB — CBC WITH DIFFERENTIAL/PLATELET
Basophils Absolute: 0 10*3/uL (ref 0.0–0.1)
Basophils Relative: 0 % (ref 0–1)
Eosinophils Absolute: 0.1 10*3/uL (ref 0.0–0.7)
MCHC: 32.1 g/dL (ref 30.0–36.0)
Monocytes Absolute: 0.5 10*3/uL (ref 0.1–1.0)
Neutro Abs: 3.6 10*3/uL (ref 1.7–7.7)
Neutrophils Relative %: 66 % (ref 43–77)
RDW: 15.1 % (ref 11.5–15.5)

## 2012-06-26 MED ORDER — IOHEXOL 300 MG/ML  SOLN
50.0000 mL | INTRAMUSCULAR | Status: AC
  Administered 2012-06-26: 50 mL via ORAL

## 2012-06-26 MED ORDER — IOHEXOL 300 MG/ML  SOLN: 50.0000 mL | Freq: Once | INTRAMUSCULAR | Status: DC | PRN

## 2012-06-26 NOTE — ED Notes (Addendum)
Diarrhea for 2-3 months,   Taking lomotil and no better.  Pt uses a w/c,  Had dialysis today.
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
## 2012-06-27 ENCOUNTER — Telehealth: Payer: Self-pay | Admitting: Family Medicine

## 2012-06-27 NOTE — Telephone Encounter (Signed)
noted 

## 2012-06-27 NOTE — Telephone Encounter (Signed)
Patient states the he went to the ED and that he has an appointment with GI in 3 weeks.  Also advised patient to speak with Dr. Kristian Covey at next dialysis tx.

## 2012-06-28 ENCOUNTER — Encounter: Payer: Self-pay | Admitting: Gastroenterology

## 2012-06-28 ENCOUNTER — Ambulatory Visit (INDEPENDENT_AMBULATORY_CARE_PROVIDER_SITE_OTHER): Payer: Medicaid Other | Admitting: Gastroenterology

## 2012-06-28 VITALS — BP 153/84 | HR 82 | Temp 97.4°F | Ht 65.0 in | Wt 253.8 lb

## 2012-06-28 DIAGNOSIS — R197 Diarrhea, unspecified: Secondary | ICD-10-CM

## 2012-06-28 MED ORDER — PEG 3350-KCL-NA BICARB-NACL 420 G PO SOLR: 4000.0000 mL | ORAL | Status: DC

## 2012-06-28 NOTE — Patient Instructions (Addendum)
   We have scheduled you for a colonoscopy with Dr. Jena Gauss in the near future.   DIABETES MEDICATION INSTRUCTIONS BEFORE PROCEDURE: Take only 5 units of Lantus the night before the procedure.

## 2012-06-28 NOTE — Progress Notes (Signed)
Referring Provider: Salley Scarlet, MD Primary Care Physician:  Milinda Antis, MD Primary Gastroenterologist: Dr. Jena Gauss   Chief Complaint  Patient presents with  . Diarrhea    HPI:   [name] is a 67 year old male who presents today secondary to chronic diarrhea. Symptoms present for at least 1 year. I had actually seen him last year secondary to the same, and he was to be scheduled for a colonoscopy. However, this was not completed due to complications from basilic vein transposition (brachiobasilic arteriovenous fistula) placement. Last TCS by Dr. Jena Gauss in 2007 secondary to anemia. Tubulovillous adenoma and adenomatous polyps noted. Oozing polyp treated with hot snare polypectomy, likely source of anemia. Somewhat overdue for surveillance now. Returns today stating bowel movements are loose, half-way watery, postprandial loose stools. No rectal bleeding. No abdominal pain. No N/V. No reflux. No dysphagia. Notes even healthy foods cause diarrhea.     Past Medical History  Diagnosis Date  . Hypertension   . Diabetes mellitus   . Peripheral vascular disease   . Obesity   . Chronic back pain   . End stage renal disease     on Hemodialysis  . ESRD on hemodialysis   . Chronic diarrhea     Past Surgical History  Procedure Laterality Date  . Above knee leg amputation      Right AKA  . Arteriovenous graft placement    . Pr vein bypass graft,aorto-fem-pop    . Insertion of dialysis catheter  07/30/2011    Procedure: INSERTION OF DIALYSIS CATHETER;  Surgeon: Larina Earthly, MD;  Location: [facility] OR;  Service: Vascular;  Laterality: Right;  removal of right dialysis catheter and exchange/ insertion of new right dialysis catheter  . Colonoscopy  Dec 2007    RMR: normal rectum, colonic polyps with ascending colon polyp actively oozing, s/p hot snare polypectomy: tubulovillous adenoma, adenomatous polyps   . Av fistula placement    . Bascilic vein transposition  08/24/2011    Left upper  arm  . Eye surgery      'not sure what kind'  . Av fistula placement  01/04/2012    Procedure: INSERTION OF ARTERIOVENOUS (AV) GORE-TEX GRAFT ARM;  Surgeon: Fransisco Hertz, MD;  Location: [facility] OR;  Service: Vascular;  Laterality: Left;  4 x 7mm stretch graft implanted in left upper arm    Current Outpatient Prescriptions  Medication Sig Dispense Refill  . cinacalcet (SENSIPAR) 60 MG tablet Take 120 mg by mouth daily. Takes 2 tablets at lunch      . diltiazem (CARDIZEM CD) 180 MG 24 hr capsule Take 180 mg by mouth daily.       . diphenoxylate-atropine (LOMOTIL) 2.5-0.025 MG per tablet Take 1 tablet by mouth 3 (three) times daily as needed. For Diarrhea      . hydrALAZINE (APRESOLINE) 50 MG tablet Take 50 mg by mouth 3 (three) times daily.       Marland Kitchen HYDROcodone-acetaminophen (NORCO) 10-325 MG per tablet Take 1 tablet by mouth 2 (two) times daily.  60 tablet  1  . insulin glargine (LANTUS) 100 UNIT/ML injection Inject 10-20 Units into the skin 2 (two) times daily. 20 units in the am and 10 units at bedtime      . loratadine (CLARITIN) 10 MG tablet Take 10 mg by mouth daily.      . multivitamin (RENA-VIT) TABS tablet Take 1 tablet by mouth daily.      . sevelamer (RENAGEL) 800 MG tablet Take 2,400-4,000 mg by  mouth 2 (two) times daily with a meal. Take 5 tabs (4000mg ) 2 times daily with meals and 3 tabs (2400mg ) as needed with snacks.       No current facility-administered medications for this visit.    Allergies as of 06/28/2012  . (No Known Allergies)    Family History  Problem Relation Age of Onset  . Hypertension Mother   . Diabetes Father   . Anesthesia problems Neg Hx   . Colon cancer Neg Hx     History   Social History  . Marital Status: Single    Spouse Name: N/A    Number of Children: N/A  . Years of Education: N/A   Social History Main Topics  . Smoking status: Never Smoker   . Smokeless tobacco: Never Used  . Alcohol Use: No  . Drug Use: No  . Sexually Active: No    Other Topics Concern  . None   Social History Narrative  . None    Review of Systems: Gen: Denies fever, chills, anorexia. Denies fatigue, weakness, weight loss.  CV: Denies chest pain, palpitations, syncope, peripheral edema, and claudication. Resp: Denies dyspnea at rest, cough, wheezing, coughing up blood, and pleurisy. GI: SEE HPI Derm: Denies rash, itching, dry skin Psych: Denies depression, anxiety, memory loss, confusion. No homicidal or suicidal ideation.  Heme: Denies bruising, bleeding, and enlarged lymph nodes.  Physical Exam: BP 153/84  Pulse 82  Temp(Src) 97.4 F (36.3 C) (Oral)  Ht 5\' 5"  (1.651 m)  Wt 253 lb 12.8 oz (115.123 kg)  BMI 42.23 kg/m2 General:   Alert and oriented. No distress noted. Pleasant and cooperative.  Head:  Normocephalic and atraumatic. Eyes:  Conjuctiva clear without scleral icterus. Mouth:  Poor dentition Neck:  Supple, without mass or thyromegaly. Heart:  S1, S2 present without murmurs, rubs, or gallops. Regular rate and rhythm. Abdomen:  +BS, soft, non-tender and non-distended. No rebound or guarding. No HSM or masses noted. Extremities:  Right BKA Neurologic:  Alert and  oriented x4;  grossly normal neurologically. Skin:  Intact without significant lesions or rashes. Psych:  Alert and cooperative. Normal mood and affect.

## 2012-06-29 NOTE — Assessment & Plan Note (Signed)
67 year old male with chronic diarrhea at least one year in duration. HE IS REFUSING STOOL STUDIES. 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 However, he does have a history of adenomatous polyps, with last TCS by Dr. Jena Gauss in 2007. He is somewhat overdue for surveillance. Noting postprandial loose stools, non-bloody. No abdominal pain, N/V. No upper GI symptoms. He has been prescribed Lomotil with slight improvement by his PCP.  Ideally, we would obtain stool studies. However, as noted, he is refusing this. Doubt infectious process. Could be dealing with microscopic colitis, diabetic enteropathy, doubt malignancy although not excluded.   Proceed with TCS with Dr. Jena Gauss in near future: the risks, benefits, and alternatives have been discussed with the patient in detail. The patient states understanding and desires to proceed.

## 2012-06-29 NOTE — Progress Notes (Signed)
Faxed to PCP

## 2012-07-04 ENCOUNTER — Ambulatory Visit: Payer: PRIVATE HEALTH INSURANCE | Admitting: Family Medicine

## 2012-07-08 DIAGNOSIS — A0472 Enterocolitis due to Clostridium difficile, not specified as recurrent: Secondary | ICD-10-CM

## 2012-07-08 HISTORY — DX: Enterocolitis due to Clostridium difficile, not specified as recurrent: A04.72

## 2012-07-13 ENCOUNTER — Telehealth: Payer: Self-pay

## 2012-07-13 ENCOUNTER — Ambulatory Visit: Payer: PRIVATE HEALTH INSURANCE | Admitting: Urgent Care

## 2012-07-13 ENCOUNTER — Ambulatory Visit (HOSPITAL_COMMUNITY)
Admission: RE | Admit: 2012-07-13 | Discharge: 2012-07-13 | Disposition: A | Discharge status: Home / Self Care | Payer: PRIVATE HEALTH INSURANCE | Source: Ambulatory Visit | Attending: Internal Medicine | Admitting: Internal Medicine

## 2012-07-13 ENCOUNTER — Encounter (HOSPITAL_COMMUNITY): Payer: Self-pay | Admitting: *Deleted

## 2012-07-13 ENCOUNTER — Encounter (HOSPITAL_COMMUNITY)
Admission: RE | Disposition: A | Discharge status: Home / Self Care | Payer: Self-pay | Source: Ambulatory Visit | Attending: Internal Medicine

## 2012-07-13 DIAGNOSIS — D126 Benign neoplasm of colon, unspecified: Secondary | ICD-10-CM | POA: Insufficient documentation

## 2012-07-13 DIAGNOSIS — E119 Type 2 diabetes mellitus without complications: Secondary | ICD-10-CM | POA: Insufficient documentation

## 2012-07-13 DIAGNOSIS — R197 Diarrhea, unspecified: Secondary | ICD-10-CM

## 2012-07-13 DIAGNOSIS — Z992 Dependence on renal dialysis: Secondary | ICD-10-CM | POA: Insufficient documentation

## 2012-07-13 DIAGNOSIS — Z860100 Personal history of colon polyps, unspecified: Secondary | ICD-10-CM | POA: Insufficient documentation

## 2012-07-13 DIAGNOSIS — Z01812 Encounter for preprocedural laboratory examination: Secondary | ICD-10-CM | POA: Insufficient documentation

## 2012-07-13 DIAGNOSIS — N186 End stage renal disease: Secondary | ICD-10-CM | POA: Insufficient documentation

## 2012-07-13 HISTORY — PX: COLONOSCOPY: SHX5424

## 2012-07-13 LAB — GLUCOSE, CAPILLARY: Glucose-Capillary: 85 mg/dL (ref 70–99)

## 2012-07-13 SURGERY — COLONOSCOPY
Anesthesia: Moderate Sedation

## 2012-07-13 MED ORDER — STERILE WATER FOR IRRIGATION IR SOLN
Status: DC | PRN
  Administered 2012-07-13: 11:00:00

## 2012-07-13 MED ORDER — ONDANSETRON HCL 4 MG/2ML IJ SOLN
INTRAMUSCULAR | Status: AC
  Filled 2012-07-13: qty 2

## 2012-07-13 MED ORDER — MIDAZOLAM HCL 5 MG/5ML IJ SOLN
INTRAMUSCULAR | Status: AC
  Filled 2012-07-13: qty 10

## 2012-07-13 MED ORDER — METRONIDAZOLE 500 MG PO TABS: 500.0000 mg | ORAL_TABLET | Freq: Three times a day (TID) | ORAL | Status: DC

## 2012-07-13 MED ORDER — MEPERIDINE HCL 100 MG/ML IJ SOLN
INTRAMUSCULAR | Status: DC | PRN
  Administered 2012-07-13: 25 mg via INTRAVENOUS

## 2012-07-13 MED ORDER — MIDAZOLAM HCL 5 MG/5ML IJ SOLN
INTRAMUSCULAR | Status: DC | PRN
  Administered 2012-07-13: 2 mg via INTRAVENOUS

## 2012-07-13 MED ORDER — SODIUM CHLORIDE 0.9 % IV SOLN
INTRAVENOUS | Status: DC
  Administered 2012-07-13: 500 mL via INTRAVENOUS

## 2012-07-13 MED ORDER — SODIUM CHLORIDE 0.45 % IV SOLN: INTRAVENOUS | Status: DC

## 2012-07-13 MED ORDER — ONDANSETRON HCL 4 MG/2ML IJ SOLN
INTRAMUSCULAR | Status: DC | PRN
  Administered 2012-07-13: 4 mg via INTRAVENOUS

## 2012-07-13 MED ORDER — MEPERIDINE HCL 100 MG/ML IJ SOLN
INTRAMUSCULAR | Status: AC
  Filled 2012-07-13: qty 1

## 2012-07-13 NOTE — Op Note (Signed)
[facility] [address] [place], [address]   COLONOSCOPY PROCEDURE REPORT  PATIENT: Julian [name], Julian [name]  MR#:         [id number] BIRTHDATE: May 06, 1946 , 66  yrs. old GENDER: Male ENDOSCOPIST: R.  Roetta Sessions, MD FACP FACG REFERRED BY:  Milinda Antis, M.D. PROCEDURE DATE:  07/13/2012 PROCEDURE:     Ileocolonoscopy with snare polypectomy  INDICATIONS: Chronic diarrhea; history of tubulovillous colonic adenoma  INFORMED CONSENT:  The risks, benefits, alternatives and imponderables including but not limited to bleeding, perforation as well as the possibility of a missed lesion have been reviewed.  The potential for biopsy, lesion removal, etc. have also been discussed.  Questions have been answered.  All parties agreeable. Please see the history and physical in the medical record for more information.  MEDICATIONS: Versed 2 mg IV and Demerol 25 mg IV. Zofran 4 mg IV  DESCRIPTION OF PROCEDURE:  After a digital rectal exam was performed, the Pentax Colonoscope [id number]  colonoscope was advanced from the anus through the rectum and colon to the area of the cecum, ileocecal valve and appendiceal orifice.  The cecum was deeply intubated.  These structures were well-seen and photographed for the record.  From the level of the cecum and ileocecal valve, the scope was slowly and cautiously withdrawn.  The mucosal surfaces were carefully surveyed utilizing scope tip deflection to facilitate fold flattening as needed.  The scope was pulled down into the rectum where a thorough examination including retroflexion was performed.    FINDINGS:  Adequate preparation. Normal rectum. Long and tortuous colon.  (1) 9 mm pedunculated polyp at the hepatic flexure. The patient also had (1) 5 mm polyp at the splenic flexure. The remainder of the colonic mucosa appeared normal. The distal 10 cm of terminal ileal mucosa appeared normal.  THERAPEUTIC / DIAGNOSTIC MANEUVERS  PERFORMED:  The above-mentioned polyps were hot snare removed. Segmental  biopsies of the ascending and Sigmoid segments taken for histologic study. Also, a stool specimen was collected for microbiology lab.  COMPLICATIONS: None  CECAL WITHDRAWAL TIME:  21 minutes  IMPRESSION:  Colonic polyps-removed as described above. Status post signal biopsy and stool sampling  RECOMMENDATIONS: Followup on pending studies.   _______________________________ eSigned:  R. Roetta Sessions, MD FACP [facility] 07/13/2012 11:17 AM   CC:    PATIENT NAME:  Julian [name], Julian [name] MR#: [id number]

## 2012-07-13 NOTE — Telephone Encounter (Signed)
Message copied by Myra Rude on Thu Jul 13, 2012  4:47 PM ------      Message from: Corbin Ade      Created: Thu Jul 13, 2012  4:33 PM       Patient has C. Difficile based on stool PCR today. Please call in a prescription for Flagyl 500 mg 3 times a day x14 days. No refills. Please let patient know. ------

## 2012-07-13 NOTE — OR Nursing (Signed)
Dr. Jena Gauss notified of blood sugar 85. Patient states, it normally runs around 4 and he feels "fine." Took Lantus 20 units yesterday morning and no diabetes medicine since then. No orders received. Will continue to monitor.

## 2012-07-13 NOTE — Interval H&P Note (Signed)
History and Physical Interval Note:  07/13/2012 10:27 AM  Julian [name]  has presented today for surgery, with the diagnosis of DIARRHEA  The various methods of treatment have been discussed with the patient and family. After consideration of risks, benefits and other options for treatment, the patient has consented to  Procedure(s) with comments: COLONOSCOPY (N/A) - 10:00 as a surgical intervention .  The patient's history has been reviewed, patient examined, no change in status, stable for surgery.  I have reviewed the patient's chart and labs.  Questions were answered to the patient's satisfaction.     Eula Listen  Colonoscopy per plan.The risks, benefits, limitations, alternatives and imponderables have been reviewed with the patient. Questions have been answered. All parties are agreeable.

## 2012-07-13 NOTE — Telephone Encounter (Signed)
Pt aware, rx sent to [facility].

## 2012-07-13 NOTE — H&P (View-Only) (Signed)
Referring Provider: [place], Kawanta F, MD Primary Care Physician:  Osterdock, KAWANTA, MD Primary Gastroenterologist: Dr. Rourk   Chief Complaint  Patient presents with  . Diarrhea    HPI:   Julian [name] is a 66-year-old male who presents today secondary to chronic diarrhea. Symptoms present for at least 1 year. I had actually seen him last year secondary to the same, and he was to be scheduled for a colonoscopy. However, this was not completed due to complications from basilic vein transposition (brachiobasilic arteriovenous fistula) placement. Last TCS by Dr. Rourk in 2007 secondary to anemia. Tubulovillous adenoma and adenomatous polyps noted. Oozing polyp treated with hot snare polypectomy, likely source of anemia. Somewhat overdue for surveillance now. Returns today stating bowel movements are loose, half-way watery, postprandial loose stools. No rectal bleeding. No abdominal pain. No N/V. No reflux. No dysphagia. Notes even healthy foods cause diarrhea.     Past Medical History  Diagnosis Date  . Hypertension   . Diabetes mellitus   . Peripheral vascular disease   . Obesity   . Chronic back pain   . End stage renal disease     on Hemodialysis  . ESRD on hemodialysis   . Chronic diarrhea     Past Surgical History  Procedure Laterality Date  . Above knee leg amputation      Right AKA  . Arteriovenous graft placement    . Pr vein bypass graft,aorto-fem-pop    . Insertion of dialysis catheter  07/30/2011    Procedure: INSERTION OF DIALYSIS CATHETER;  Surgeon: Todd F Early, MD;  Location: [facility] OR;  Service: Vascular;  Laterality: Right;  removal of right dialysis catheter and exchange/ insertion of new right dialysis catheter  . Colonoscopy  Dec 2007    RMR: normal rectum, colonic polyps with ascending colon polyp actively oozing, s/p hot snare polypectomy: tubulovillous adenoma, adenomatous polyps   . Av fistula placement    . Bascilic vein transposition  08/24/2011    Left upper  arm  . Eye surgery      'not sure what kind'  . Av fistula placement  01/04/2012    Procedure: INSERTION OF ARTERIOVENOUS (AV) GORE-TEX GRAFT ARM;  Surgeon: Brian L Chen, MD;  Location: [facility] OR;  Service: Vascular;  Laterality: Left;  4 x 7mm stretch graft implanted in left upper arm    Current Outpatient Prescriptions  Medication Sig Dispense Refill  . cinacalcet (SENSIPAR) 60 MG tablet Take 120 mg by mouth daily. Takes 2 tablets at lunch      . diltiazem (CARDIZEM CD) 180 MG 24 hr capsule Take 180 mg by mouth daily.       . diphenoxylate-atropine (LOMOTIL) 2.5-0.025 MG per tablet Take 1 tablet by mouth 3 (three) times daily as needed. For Diarrhea      . hydrALAZINE (APRESOLINE) 50 MG tablet Take 50 mg by mouth 3 (three) times daily.       . HYDROcodone-acetaminophen (NORCO) 10-325 MG per tablet Take 1 tablet by mouth 2 (two) times daily.  60 tablet  1  . insulin glargine (LANTUS) 100 UNIT/ML injection Inject 10-20 Units into the skin 2 (two) times daily. 20 units in the am and 10 units at bedtime      . loratadine (CLARITIN) 10 MG tablet Take 10 mg by mouth daily.      . multivitamin (RENA-VIT) TABS tablet Take 1 tablet by mouth daily.      . sevelamer (RENAGEL) 800 MG tablet Take 2,400-4,000 mg by   mouth 2 (two) times daily with a meal. Take 5 tabs (4000mg) 2 times daily with meals and 3 tabs (2400mg) as needed with snacks.       No current facility-administered medications for this visit.    Allergies as of 06/28/2012  . (No Known Allergies)    Family History  Problem Relation Age of Onset  . Hypertension Mother   . Diabetes Father   . Anesthesia problems Neg Hx   . Colon cancer Neg Hx     History   Social History  . Marital Status: Single    Spouse Name: N/A    Number of Children: N/A  . Years of Education: N/A   Social History Main Topics  . Smoking status: Never Smoker   . Smokeless tobacco: Never Used  . Alcohol Use: No  . Drug Use: No  . Sexually Active: No    Other Topics Concern  . None   Social History Narrative  . None    Review of Systems: Gen: Denies fever, chills, anorexia. Denies fatigue, weakness, weight loss.  CV: Denies chest pain, palpitations, syncope, peripheral edema, and claudication. Resp: Denies dyspnea at rest, cough, wheezing, coughing up blood, and pleurisy. GI: SEE HPI Derm: Denies rash, itching, dry skin Psych: Denies depression, anxiety, memory loss, confusion. No homicidal or suicidal ideation.  Heme: Denies bruising, bleeding, and enlarged lymph nodes.  Physical Exam: BP 153/84  Pulse 82  Temp(Src) 97.4 F (36.3 C) (Oral)  Ht 5' 5" (1.651 m)  Wt 253 lb 12.8 oz (115.123 kg)  BMI 42.23 kg/m2 General:   Alert and oriented. No distress noted. Pleasant and cooperative.  Head:  Normocephalic and atraumatic. Eyes:  Conjuctiva clear without scleral icterus. Mouth:  Poor dentition Neck:  Supple, without mass or thyromegaly. Heart:  S1, S2 present without murmurs, rubs, or gallops. Regular rate and rhythm. Abdomen:  +BS, soft, non-tender and non-distended. No rebound or guarding. No HSM or masses noted. Extremities:  Right BKA Neurologic:  Alert and  oriented x4;  grossly normal neurologically. Skin:  Intact without significant lesions or rashes. Psych:  Alert and cooperative. Normal mood and affect.  

## 2012-07-14 LAB — OVA AND PARASITE EXAMINATION: Ova and parasites: NONE SEEN

## 2012-07-14 LAB — FECAL LACTOFERRIN, QUANT

## 2012-07-14 LAB — GIARDIA/CRYPTOSPORIDIUM SCREEN(EIA): Giardia Screen - EIA: NEGATIVE

## 2012-07-16 ENCOUNTER — Encounter: Payer: Self-pay | Admitting: Internal Medicine

## 2012-07-17 ENCOUNTER — Encounter: Payer: Self-pay | Admitting: *Deleted

## 2012-07-17 LAB — STOOL CULTURE

## 2012-07-18 ENCOUNTER — Encounter (HOSPITAL_COMMUNITY): Payer: Self-pay | Admitting: Internal Medicine

## 2012-07-21 ENCOUNTER — Encounter (HOSPITAL_COMMUNITY): Payer: Self-pay | Admitting: *Deleted

## 2012-07-21 ENCOUNTER — Emergency Department (HOSPITAL_COMMUNITY)
Admission: EM | Admit: 2012-07-21 | Discharge: 2012-07-21 | Disposition: A | Discharge status: Home / Self Care | Payer: PRIVATE HEALTH INSURANCE | Attending: Emergency Medicine | Admitting: Emergency Medicine

## 2012-07-21 DIAGNOSIS — G8929 Other chronic pain: Secondary | ICD-10-CM | POA: Insufficient documentation

## 2012-07-21 DIAGNOSIS — E669 Obesity, unspecified: Secondary | ICD-10-CM | POA: Insufficient documentation

## 2012-07-21 DIAGNOSIS — E119 Type 2 diabetes mellitus without complications: Secondary | ICD-10-CM | POA: Insufficient documentation

## 2012-07-21 DIAGNOSIS — I739 Peripheral vascular disease, unspecified: Secondary | ICD-10-CM | POA: Insufficient documentation

## 2012-07-21 DIAGNOSIS — R34 Anuria and oliguria: Secondary | ICD-10-CM

## 2012-07-21 DIAGNOSIS — I120 Hypertensive chronic kidney disease with stage 5 chronic kidney disease or end stage renal disease: Secondary | ICD-10-CM | POA: Insufficient documentation

## 2012-07-21 DIAGNOSIS — Z79899 Other long term (current) drug therapy: Secondary | ICD-10-CM | POA: Insufficient documentation

## 2012-07-21 DIAGNOSIS — N186 End stage renal disease: Secondary | ICD-10-CM | POA: Insufficient documentation

## 2012-07-21 DIAGNOSIS — Z992 Dependence on renal dialysis: Secondary | ICD-10-CM | POA: Insufficient documentation

## 2012-07-21 NOTE — ED Provider Notes (Signed)
History     CSN: [id number]  Arrival date & time 07/21/12  5621   First MD Initiated Contact with Patient 07/21/12 0522      Chief Complaint  Patient presents with  . Dysuria     The history is provided by the patient.   the patient is a dialysis patient who dialyzes Monday Wednesdays and Fridays.  He dialyzed on Wednesday and is scheduled for dialysis later this morning.  He states yesterday he had a very small amount of discomfort with urination and feels as though he's not making as much urine as he used to.  He's unable urinate for [place] at the bedside.  He doesn't feel any abdominal discomfort at this time.  No fevers or chills.  No nausea or vomiting.  He has not missed any dialysis appointments.  His dialysis is in 2 hours.  He has no other complaints.  Past Medical History  Diagnosis Date  . Hypertension   . Diabetes mellitus   . Peripheral vascular disease   . Obesity   . Chronic back pain   . End stage renal disease     on Hemodialysis  . ESRD on hemodialysis   . Chronic diarrhea     Past Surgical History  Procedure Laterality Date  . Above knee leg amputation      Right AKA  . Arteriovenous graft placement    . Pr vein bypass graft,aorto-fem-pop    . Insertion of dialysis catheter  07/30/2011    Procedure: INSERTION OF DIALYSIS CATHETER;  Surgeon: Larina Earthly, MD;  Location: [facility] OR;  Service: Vascular;  Laterality: Right;  removal of right dialysis catheter and exchange/ insertion of new right dialysis catheter  . Colonoscopy  Dec 2007    RMR: normal rectum, colonic polyps with ascending colon polyp actively oozing, s/p hot snare polypectomy: tubulovillous adenoma, adenomatous polyps   . Av fistula placement    . Bascilic vein transposition  08/24/2011    Left upper arm  . Eye surgery      'not sure what kind'  . Av fistula placement  01/04/2012    Procedure: INSERTION OF ARTERIOVENOUS (AV) GORE-TEX GRAFT ARM;  Surgeon: Fransisco Hertz, MD;  Location: [facility] OR;  Service:  Vascular;  Laterality: Left;  4 x 7mm stretch graft implanted in left upper arm  . Colonoscopy N/A 07/13/2012    Procedure: COLONOSCOPY;  Surgeon: Corbin Ade, MD;  Location: [facility];  Service: Endoscopy;  Laterality: N/A;  10:00    Family History  Problem Relation Age of Onset  . Hypertension Mother   . Diabetes Father   . Anesthesia problems Neg Hx   . Colon cancer Neg Hx     History  Substance Use Topics  . Smoking status: Never Smoker   . Smokeless tobacco: Never Used  . Alcohol Use: No      Review of Systems  Genitourinary: Positive for dysuria.  All other systems reviewed and are negative.    Allergies  Review of patient's allergies indicates no known allergies.  Home Medications   Current Outpatient Rx  Name  Route  Sig  Dispense  Refill  . cinacalcet (SENSIPAR) 60 MG tablet   Oral   Take 120 mg by mouth daily. Takes 2 tablets at lunch         . diltiazem (CARDIZEM CD) 180 MG 24 hr capsule   Oral   Take 180 mg by mouth daily.          Marland Kitchen  diphenoxylate-atropine (LOMOTIL) 2.5-0.025 MG per tablet   Oral   Take 1 tablet by mouth 3 (three) times daily as needed. For Diarrhea         . hydrALAZINE (APRESOLINE) 50 MG tablet   Oral   Take 50 mg by mouth 3 (three) times daily.          Marland Kitchen HYDROcodone-acetaminophen (NORCO) 10-325 MG per tablet   Oral   Take 1 tablet by mouth 2 (two) times daily.   60 tablet   1     OK TO FILL TODAY   . insulin glargine (LANTUS) 100 UNIT/ML injection   Subcutaneous   Inject 10-20 Units into the skin 2 (two) times daily. 20 units in the am and 10 units at bedtime         . loratadine (CLARITIN) 10 MG tablet   Oral   Take 10 mg by mouth daily.         . metroNIDAZOLE (FLAGYL) 500 MG tablet   Oral   Take 1 tablet (500 mg total) by mouth 3 (three) times daily.   42 tablet   0   . multivitamin (RENA-VIT) TABS tablet   Oral   Take 1 tablet by mouth daily.         . polyethylene glycol-electrolytes  (TRILYTE) 420 G solution   Oral   Take 4,000 mLs by mouth as directed.   4000 mL   0   . sevelamer (RENAGEL) 800 MG tablet   Oral   Take 2,400-4,000 mg by mouth 2 (two) times daily with a meal. Take 5 tabs (4000mg ) 2 times daily with meals and 3 tabs (2400mg ) as needed with snacks.           BP 129/57  Pulse 79  Temp(Src) 98.2 F (36.8 C) (Oral)  Ht 5\' 10"  (1.778 m)  Wt 255 lb (115.667 kg)  BMI 36.59 kg/m2  SpO2 97%  Physical Exam  Nursing note and vitals reviewed. Constitutional: He is oriented to person, place, and time. He appears well-developed and well-nourished.  HENT:  Head: Normocephalic and atraumatic.  Eyes: EOM are normal.  Neck: Normal range of motion.  Cardiovascular: Normal rate, regular rhythm, normal heart sounds and intact distal pulses.   Pulmonary/Chest: Effort normal and breath sounds normal. No respiratory distress.  Abdominal: Soft. He exhibits no distension. There is no tenderness.  Genitourinary: Testes normal and penis normal. Right testis shows no swelling and no tenderness. Left testis shows no swelling and no tenderness. No phimosis, penile erythema or penile tenderness. No discharge found.  Musculoskeletal: Normal range of motion.  Neurological: He is alert and oriented to person, place, and time.  Skin: Skin is warm and dry.  Psychiatric: He has a normal mood and affect. Judgment normal.    ED Course  Procedures (including critical care time)  Labs Reviewed  URINALYSIS, ROUTINE W REFLEX MICROSCOPIC   No results found.   1. Anuria and oliguria       MDM  Patient be taken to dialysis this time.  He'll followup with his nephrologist regarding this.  The patient makes only a very small amount urine at baseline.  He's unable urinate for [place] now.  Bedside ultrasound demonstrates no distended bladder.  At this time with normal vital signs I feel that the patient is safe for close PCP followup    Lyanne Co, MD 07/21/12 [phone]

## 2012-07-21 NOTE — ED Notes (Signed)
Pt states pain w/ urination that started yesterday. This morning pt states he was also hurting down there.

## 2012-07-21 NOTE — ED Notes (Signed)
Pt being transported back home so he can get his motorized wheelchair & then calling [facility] to carry to dialysis.

## 2012-07-21 NOTE — ED Notes (Addendum)
Pt states he has dialysis [facility]. Pt states he does not make much urine. Pt given a urinal to try & get a urine sample

## 2012-07-25 ENCOUNTER — Ambulatory Visit: Payer: PRIVATE HEALTH INSURANCE | Admitting: Family Medicine

## 2012-07-26 ENCOUNTER — Telehealth: Payer: Self-pay

## 2012-07-26 ENCOUNTER — Other Ambulatory Visit: Payer: Self-pay

## 2012-07-26 MED ORDER — HYDROCODONE-ACETAMINOPHEN 10-325 MG PO TABS: 1.0000 | ORAL_TABLET | Freq: Two times a day (BID) | ORAL | Status: DC

## 2012-07-26 NOTE — Telephone Encounter (Signed)
Advise urine specimen collection, nurse to check CCUA in office and follow through from that. Also needs to sched appt with his PCP next week for f/u

## 2012-07-26 NOTE — Telephone Encounter (Signed)
Will get his aid to bring him by tomorrow am to leave a urine specimen.

## 2012-07-26 NOTE — Telephone Encounter (Signed)
C/o dysuria. Went to ED 3/14 but unable to void so ED sent him home and he was told to follow up with pcp. Still having dysuria and wants to come in. Want to see him for OV or see if aid can collect a urine sample to bring in? Please advise and I will call him back [phone]

## 2012-07-27 ENCOUNTER — Other Ambulatory Visit: Payer: Self-pay | Admitting: Family Medicine

## 2012-07-27 ENCOUNTER — Telehealth (INDEPENDENT_AMBULATORY_CARE_PROVIDER_SITE_OTHER): Payer: PRIVATE HEALTH INSURANCE

## 2012-07-27 DIAGNOSIS — N390 Urinary tract infection, site not specified: Secondary | ICD-10-CM

## 2012-07-27 LAB — POCT URINALYSIS DIPSTICK
Ketones, UA: NEGATIVE
Nitrite, UA: NEGATIVE
Protein, UA: >300
pH, UA: 7.5

## 2012-07-27 MED ORDER — CIPROFLOXACIN HCL 500 MG PO TABS: 500.0000 mg | ORAL_TABLET | Freq: Two times a day (BID) | ORAL | Status: AC

## 2012-07-27 NOTE — Telephone Encounter (Signed)
Since symptomatic and UA abn, 5 day course of ciprio sent in pls make pt and aid aware. PCP will also be notified on her return Ensure c/s followed up pls

## 2012-07-27 NOTE — Telephone Encounter (Signed)
Patient aware.

## 2012-07-27 NOTE — Telephone Encounter (Signed)
Patients aid brought by urine specimen. Will send for culture. Pt c/o still of dysuria and frequency. Will send results to Dr for treatment recommendations

## 2012-07-29 LAB — URINE CULTURE: Colony Count: >=100000

## 2012-08-01 ENCOUNTER — Encounter: Payer: Self-pay | Admitting: Family Medicine

## 2012-08-01 ENCOUNTER — Ambulatory Visit (INDEPENDENT_AMBULATORY_CARE_PROVIDER_SITE_OTHER): Payer: PRIVATE HEALTH INSURANCE | Admitting: Family Medicine

## 2012-08-01 VITALS — BP 142/84 | HR 80 | Resp 18 | Ht 70.0 in

## 2012-08-01 DIAGNOSIS — E669 Obesity, unspecified: Secondary | ICD-10-CM

## 2012-08-01 DIAGNOSIS — E119 Type 2 diabetes mellitus without complications: Secondary | ICD-10-CM

## 2012-08-01 DIAGNOSIS — I10 Essential (primary) hypertension: Secondary | ICD-10-CM

## 2012-08-01 DIAGNOSIS — N390 Urinary tract infection, site not specified: Secondary | ICD-10-CM

## 2012-08-01 NOTE — Patient Instructions (Addendum)
Decrease lantus to 20 units in morning  Continue current medications F/U 3 months

## 2012-08-02 ENCOUNTER — Telehealth: Payer: Self-pay | Admitting: Family Medicine

## 2012-08-02 ENCOUNTER — Encounter: Payer: Self-pay | Admitting: Family Medicine

## 2012-08-02 DIAGNOSIS — N390 Urinary tract infection, site not specified: Secondary | ICD-10-CM | POA: Insufficient documentation

## 2012-08-02 NOTE — Telephone Encounter (Signed)
Patient aware that diabetics should not drink soda.

## 2012-08-02 NOTE — Assessment & Plan Note (Signed)
Multiple colonies noted, I will not give any further treatment he does not have a lot of output at baseline due to ESRD

## 2012-08-02 NOTE — Progress Notes (Signed)
  Subjective:    Patient ID: Julian [name], male    DOB: [birth year], 67 y.o.   MRN: [id number]  HPI  Pt here to f/u chronic medical problems. No particular concerns, seen in ED due to difficulty urinating and dysuria. Brought sample to our Office, started on antibiotics, has completed, culture came back with multiple colonies, symptoms now resolved, urinating as usual. Taking Lantus 20 units AM 5 units in PM, states his sugar will drop low at night and early morning, eats a snack. No CBG > 200 during day  Review of Systems  GEN- denies fatigue, fever, weight loss,weakness, recent illness HEENT- denies eye drainage, change in vision, nasal discharge, CVS- denies chest pain, palpitations RESP- denies SOB, cough, wheeze ABD- denies N/V, change in stools, abd pain GU- denies dysuria, hematuria, dribbling, incontinence MSK- denies joint pain, muscle aches, injury Neuro- denies headache, dizziness, syncope, seizure activity      Objective:   Physical Exam GEN- NAD, alert and oriented x3 HEENT- PERRL, EOMI, non injected sclera, pink conjunctiva, MMM, oropharynx clear Neck- Supple,  CVS- RRR, no murmur RESP-CTAB ABD-NABS,soft,NT,ND EXT- LLE + edema, Left Foot- only Great Toe left, no open lesions Pulses- Radial 2+, DP- 1+        Assessment & Plan:

## 2012-08-02 NOTE — Assessment & Plan Note (Signed)
There is a lot of confusion regarding his insulin intake, after his last A1C I had planned to stop lantus, thinking he was only injecting 10 units, instead he had decreased his bedtime dose to 5 units. We will d/c the bedtime dose all together, he will continue 20 units in morning for now, he is to call for CBG >200 or hypoglycemia

## 2012-08-02 NOTE — Assessment & Plan Note (Signed)
BP looks good  

## 2012-08-02 NOTE — Assessment & Plan Note (Signed)
Unchanged, wheelchair bound

## 2012-08-31 ENCOUNTER — Encounter: Payer: Self-pay | Admitting: Family Medicine

## 2012-09-05 ENCOUNTER — Encounter: Payer: Self-pay | Admitting: *Deleted

## 2012-09-19 ENCOUNTER — Ambulatory Visit (INDEPENDENT_AMBULATORY_CARE_PROVIDER_SITE_OTHER): Payer: PRIVATE HEALTH INSURANCE | Admitting: Family Medicine

## 2012-09-19 ENCOUNTER — Encounter: Payer: Self-pay | Admitting: Family Medicine

## 2012-09-19 VITALS — BP 138/82 | HR 77 | Resp 16

## 2012-09-19 DIAGNOSIS — I10 Essential (primary) hypertension: Secondary | ICD-10-CM

## 2012-09-19 DIAGNOSIS — M549 Dorsalgia, unspecified: Secondary | ICD-10-CM

## 2012-09-19 DIAGNOSIS — N186 End stage renal disease: Secondary | ICD-10-CM

## 2012-09-19 DIAGNOSIS — Z992 Dependence on renal dialysis: Secondary | ICD-10-CM

## 2012-09-19 DIAGNOSIS — E119 Type 2 diabetes mellitus without complications: Secondary | ICD-10-CM

## 2012-09-19 LAB — HEMOGLOBIN A1C: Hgb A1c MFr Bld: 5.6 % (ref <5.7)

## 2012-09-19 MED ORDER — HYDROCODONE-ACETAMINOPHEN 10-325 MG PO TABS: 1.0000 | ORAL_TABLET | Freq: Two times a day (BID) | ORAL | Status: DC

## 2012-09-19 NOTE — Patient Instructions (Addendum)
Hydrocodone 10mg  is your pain pill, take twice a day Continue your insulin Get the A1C today F/U 3 months - [employer]

## 2012-09-20 NOTE — Progress Notes (Signed)
  Subjective:    Patient ID: Julian [name], male    DOB: 04/01/1946, 67 y.o.   MRN: [id number]  HPI  Pt here to f/u chronic medical problems. He is using pain medication as prescribed, states he gets pain when transferring from chair to toilet. His Aide wrote down he was on oxycodone 10mg  and to switch him back to this. He has not had any ER visits recently for pain. DM- still gets a few low sugars at night, taking 20 units lantus in morning  Review of Systems  GEN- denies fatigue, fever, weight loss,weakness, recent illness HEENT- denies eye drainage, change in vision, nasal discharge, CVS- denies chest pain, palpitations RESP- denies SOB, cough, wheeze ABD- denies N/V, change in stools, abd pain GU- denies dysuria, hematuria, dribbling, incontinence MSK- + joint pain, muscle aches, injury Neuro- denies headache, dizziness, syncope, seizure activity      Objective:   Physical Exam GEN- NAD, alert and oriented x3, wheelchair HEENT- PERRL, EOMI, non injected sclera, pink conjunctiva, MMM, oropharynx clear  CVS- RRR, no murmur RESP-CTAB ABD-NABS,soft,NT,ND EXT- LLE + edema, Left Foot- great toe only, Right BKA Pulses- Radial 2+, DP- 1+         Assessment & Plan:

## 2012-09-20 NOTE — Assessment & Plan Note (Addendum)
A1C checked, will decrease to 15 units, overcorrected Seen by eye doctor

## 2012-09-20 NOTE — Assessment & Plan Note (Signed)
Chronic pain due to mobility difficulties I do not see any reason to increase him to oxycodone at this time He has been doing fairly well on hydrocodone

## 2012-09-20 NOTE — Assessment & Plan Note (Signed)
Unchanged, doing well

## 2012-09-20 NOTE — Assessment & Plan Note (Signed)
Well controlled 

## 2012-10-02 ENCOUNTER — Emergency Department (HOSPITAL_COMMUNITY)
Admission: EM | Admit: 2012-10-02 | Discharge: 2012-10-02 | Disposition: A | Discharge status: Home / Self Care | Payer: PRIVATE HEALTH INSURANCE | Attending: Emergency Medicine | Admitting: Emergency Medicine

## 2012-10-02 ENCOUNTER — Encounter (HOSPITAL_COMMUNITY): Payer: Self-pay | Admitting: *Deleted

## 2012-10-02 DIAGNOSIS — S78119A Complete traumatic amputation at level between unspecified hip and knee, initial encounter: Secondary | ICD-10-CM | POA: Insufficient documentation

## 2012-10-02 DIAGNOSIS — X503XXA Overexertion from repetitive movements, initial encounter: Secondary | ICD-10-CM | POA: Insufficient documentation

## 2012-10-02 DIAGNOSIS — E119 Type 2 diabetes mellitus without complications: Secondary | ICD-10-CM | POA: Insufficient documentation

## 2012-10-02 DIAGNOSIS — IMO0002 Reserved for concepts with insufficient information to code with codable children: Secondary | ICD-10-CM | POA: Insufficient documentation

## 2012-10-02 DIAGNOSIS — Y9389 Activity, other specified: Secondary | ICD-10-CM | POA: Insufficient documentation

## 2012-10-02 DIAGNOSIS — Z992 Dependence on renal dialysis: Secondary | ICD-10-CM | POA: Insufficient documentation

## 2012-10-02 DIAGNOSIS — N186 End stage renal disease: Secondary | ICD-10-CM | POA: Insufficient documentation

## 2012-10-02 DIAGNOSIS — I120 Hypertensive chronic kidney disease with stage 5 chronic kidney disease or end stage renal disease: Secondary | ICD-10-CM | POA: Insufficient documentation

## 2012-10-02 DIAGNOSIS — Z79899 Other long term (current) drug therapy: Secondary | ICD-10-CM | POA: Insufficient documentation

## 2012-10-02 DIAGNOSIS — Z993 Dependence on wheelchair: Secondary | ICD-10-CM | POA: Insufficient documentation

## 2012-10-02 DIAGNOSIS — G8929 Other chronic pain: Secondary | ICD-10-CM | POA: Insufficient documentation

## 2012-10-02 DIAGNOSIS — T148XXA Other injury of unspecified body region, initial encounter: Secondary | ICD-10-CM

## 2012-10-02 DIAGNOSIS — Y929 Unspecified place or not applicable: Secondary | ICD-10-CM | POA: Insufficient documentation

## 2012-10-02 DIAGNOSIS — Z8679 Personal history of other diseases of the circulatory system: Secondary | ICD-10-CM | POA: Insufficient documentation

## 2012-10-02 DIAGNOSIS — E669 Obesity, unspecified: Secondary | ICD-10-CM | POA: Insufficient documentation

## 2012-10-02 DIAGNOSIS — M549 Dorsalgia, unspecified: Secondary | ICD-10-CM | POA: Insufficient documentation

## 2012-10-02 DIAGNOSIS — Z794 Long term (current) use of insulin: Secondary | ICD-10-CM | POA: Insufficient documentation

## 2012-10-02 MED ORDER — IBUPROFEN 400 MG PO TABS: 400.0000 mg | ORAL_TABLET | Freq: Four times a day (QID) | ORAL | Status: DC | PRN

## 2012-10-02 MED ORDER — METHOCARBAMOL 500 MG PO TABS: 500.0000 mg | ORAL_TABLET | Freq: Two times a day (BID) | ORAL | Status: DC

## 2012-10-02 MED ORDER — METHOCARBAMOL 500 MG PO TABS
500.0000 mg | ORAL_TABLET | Freq: Once | ORAL | Status: AC
  Administered 2012-10-02: 500 mg via ORAL
  Filled 2012-10-02: qty 1

## 2012-10-02 MED ORDER — KETOROLAC TROMETHAMINE 60 MG/2ML IM SOLN
60.0000 mg | Freq: Once | INTRAMUSCULAR | Status: AC
  Administered 2012-10-02: 60 mg via INTRAMUSCULAR
  Filled 2012-10-02: qty 2

## 2012-10-02 NOTE — ED Provider Notes (Signed)
History  This chart was scribed for Julian Racer, MD by Bennett Scrape, ED Scribe. This patient was seen in room APA15/[facility]15 and the patient's care was started at 5:01 PM.  CSN: [id number]  Arrival date & time 10/02/12  1513   First MD Initiated Contact with Patient 10/02/12 1701      Chief Complaint  Patient presents with  . Leg Pain     Patient is a 67 y.o. male presenting with leg pain. The history is provided by the patient. No language interpreter was used.  Leg Pain Location:  Leg Time since incident:  2 days Injury: no   Leg location:  L upper leg Pain details:    Quality:  Aching   Radiates to:  L leg   Severity:  Mild   Onset quality:  Gradual   Duration:  2 days   Timing:  Constant   Progression:  Unchanged Chronicity:  New Dislocation: no   Relieved by:  Rest Worsened by:  Bearing weight Ineffective treatments:  None tried Associated symptoms: no back pain, no fever, no numbness and no swelling     HPI Comments: Julian [name] is a 67 y.o. male who presents to the Emergency Department complaining of 2 days of gradual onset, gradually worsening, constant left leg pain, worse in the thigh, after his push power wheelchair got stuck and he had to push himself home. He reports that the pain did not start until the day after the incident and is worse with ambulation. Pt denies taking OTC medications at home to improve symptoms. He denies fevers, numbness and weakness as associated symptoms. He has a h/o HTN, DM and chronic pain. Pt denies smoking and alcohol use.   Past Medical History  Diagnosis Date  . Hypertension   . Diabetes mellitus   . Peripheral vascular disease   . Obesity   . Chronic back pain   . End stage renal disease     on Hemodialysis  . ESRD on hemodialysis   . Chronic diarrhea     Past Surgical History  Procedure Laterality Date  . Above knee leg amputation      Right AKA  . Arteriovenous graft placement    . Pr vein bypass  graft,aorto-fem-pop    . Insertion of dialysis catheter  07/30/2011    Procedure: INSERTION OF DIALYSIS CATHETER;  Surgeon: Larina Earthly, MD;  Location: [facility] OR;  Service: Vascular;  Laterality: Right;  removal of right dialysis catheter and exchange/ insertion of new right dialysis catheter  . Colonoscopy  Dec 2007    RMR: normal rectum, colonic polyps with ascending colon polyp actively oozing, s/p hot snare polypectomy: tubulovillous adenoma, adenomatous polyps   . Av fistula placement    . Bascilic vein transposition  08/24/2011    Left upper arm  . Eye surgery      'not sure what kind'  . Av fistula placement  01/04/2012    Procedure: INSERTION OF ARTERIOVENOUS (AV) GORE-TEX GRAFT ARM;  Surgeon: Fransisco Hertz, MD;  Location: [facility] OR;  Service: Vascular;  Laterality: Left;  4 x 7mm stretch graft implanted in left upper arm  . Colonoscopy N/A 07/13/2012    Procedure: COLONOSCOPY;  Surgeon: Corbin Ade, MD;  Location: [facility];  Service: Endoscopy;  Laterality: N/A;  10:00    Family History  Problem Relation Age of Onset  . Hypertension Mother   . Diabetes Father   . Anesthesia problems Neg Hx   .  Colon cancer Neg Hx     History  Substance Use Topics  . Smoking status: Never Smoker   . Smokeless tobacco: Never Used  . Alcohol Use: No      Review of Systems  Constitutional: Negative for fever and chills.  Musculoskeletal: Positive for myalgias. Negative for back pain.  Neurological: Negative for weakness and numbness.  All other systems reviewed and are negative.    Allergies  Review of patient's allergies indicates no known allergies.  Home Medications   Current Outpatient Rx  Name  Route  Sig  Dispense  Refill  . cinacalcet (SENSIPAR) 60 MG tablet   Oral   Take 120 mg by mouth daily. Takes 2 tablets at lunch         . diltiazem (CARDIZEM CD) 180 MG 24 hr capsule   Oral   Take 180 mg by mouth daily.          . diphenoxylate-atropine (LOMOTIL) 2.5-0.025 MG  per tablet   Oral   Take 1 tablet by mouth 3 (three) times daily as needed. For Diarrhea         . hydrALAZINE (APRESOLINE) 50 MG tablet   Oral   Take 50 mg by mouth 3 (three) times daily.          Marland Kitchen HYDROcodone-acetaminophen (NORCO) 10-325 MG per tablet   Oral   Take 1 tablet by mouth 2 (two) times daily.   60 tablet   1     OK TO FILL TODAY   . insulin glargine (LANTUS) 100 UNIT/ML injection   Subcutaneous   Inject 15 Units into the skin daily.          Marland Kitchen loratadine (CLARITIN) 10 MG tablet   Oral   Take 10 mg by mouth daily.         . multivitamin (RENA-VIT) TABS tablet   Oral   Take 1 tablet by mouth daily.         . sevelamer (RENAGEL) 800 MG tablet   Oral   Take 2,400-4,000 mg by mouth 2 (two) times daily with a meal. Take 5 tabs (4000mg ) 2 times daily with meals and 3 tabs (2400mg ) as needed with snacks.           Triage Vitals: BP 125/87  Pulse 80  Temp(Src) 97.6 F (36.4 C) (Oral)  Resp 18  Ht 5\' 10"  (1.778 m)  SpO2 99%  Physical Exam  Nursing note and vitals reviewed. Constitutional: He is oriented to person, place, and time. He appears well-developed and well-nourished. No distress.  HENT:  Head: Normocephalic and atraumatic.  Eyes: EOM are normal.  Neck: Neck supple. No tracheal deviation present.  Cardiovascular: Normal rate and regular rhythm.   Pulmonary/Chest: Effort normal and breath sounds normal. No respiratory distress.  Musculoskeletal: Normal range of motion. He exhibits no edema.  Tenderness to palpation over the left quadriceps, no swelling, good distal pulses, right AKA  Neurological: He is alert and oriented to person, place, and time.  5/5 motor in the left leg  Skin: Skin is warm and dry.  Psychiatric: He has a normal mood and affect. His behavior is normal.    ED Course  Procedures (including critical care time)  Medications  ketorolac (TORADOL) injection 60 mg (not administered)  methocarbamol (ROBAXIN) tablet  500 mg (not administered)    DIAGNOSTIC STUDIES: Oxygen Saturation is 99% on room air, normal by my interpretation.    COORDINATION OF CARE: 5:02 PM-Advised pt that his  symptoms are most likely a muscle strain. Discussed treatment plan which includes pain medication in the ED and prescriptions of ibuprofen and robaxin with pt at bedside and pt agreed to plan.     1. Muscle strain       MDM  I personally performed the services described in this documentation, which was scribed in my presence. The recorded information has been reviewed and is accurate.     Julian Racer, MD 10/02/12 Ebony Cargo

## 2012-10-02 NOTE — ED Notes (Signed)
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

## 2012-10-02 NOTE — ED Notes (Signed)
Pt presents with phantom leg pains in rt lower leg and  Left thigh pain after pushing his power scooter when battery was depleted. Pt denies injury or fall . No swelling or bruising noted.

## 2012-10-20 ENCOUNTER — Ambulatory Visit: Payer: Self-pay | Admitting: Family Medicine

## 2012-10-27 ENCOUNTER — Encounter (HOSPITAL_COMMUNITY): Payer: Self-pay

## 2012-10-27 ENCOUNTER — Other Ambulatory Visit (HOSPITAL_COMMUNITY): Payer: Self-pay | Admitting: Nephrology

## 2012-10-27 ENCOUNTER — Ambulatory Visit (HOSPITAL_COMMUNITY)
Admission: RE | Admit: 2012-10-27 | Discharge: 2012-10-27 | Disposition: A | Discharge status: Home / Self Care | Payer: PRIVATE HEALTH INSURANCE | Source: Ambulatory Visit | Attending: Nephrology | Admitting: Nephrology

## 2012-10-27 DIAGNOSIS — Z992 Dependence on renal dialysis: Secondary | ICD-10-CM | POA: Insufficient documentation

## 2012-10-27 DIAGNOSIS — E119 Type 2 diabetes mellitus without complications: Secondary | ICD-10-CM | POA: Insufficient documentation

## 2012-10-27 DIAGNOSIS — N186 End stage renal disease: Secondary | ICD-10-CM | POA: Insufficient documentation

## 2012-10-27 DIAGNOSIS — G8929 Other chronic pain: Secondary | ICD-10-CM | POA: Insufficient documentation

## 2012-10-27 DIAGNOSIS — IMO0002 Reserved for concepts with insufficient information to code with codable children: Secondary | ICD-10-CM | POA: Insufficient documentation

## 2012-10-27 DIAGNOSIS — Y841 Kidney dialysis as the cause of abnormal reaction of the patient, or of later complication, without mention of misadventure at the time of the procedure: Secondary | ICD-10-CM | POA: Insufficient documentation

## 2012-10-27 DIAGNOSIS — T82898A Other specified complication of vascular prosthetic devices, implants and grafts, initial encounter: Secondary | ICD-10-CM | POA: Insufficient documentation

## 2012-10-27 DIAGNOSIS — R197 Diarrhea, unspecified: Secondary | ICD-10-CM | POA: Insufficient documentation

## 2012-10-27 MED ORDER — HEPARIN SODIUM (PORCINE) 1000 UNIT/ML IJ SOLN
INTRAMUSCULAR | Status: AC
  Filled 2012-10-27: qty 1

## 2012-10-27 MED ORDER — HEPARIN SODIUM (PORCINE) 1000 UNIT/ML IJ SOLN
INTRAMUSCULAR | Status: DC | PRN
  Administered 2012-10-27: 3000 [IU] via INTRAVENOUS

## 2012-10-27 MED ORDER — ALTEPLASE 100 MG IV SOLR
2.0000 mg | Freq: Once | INTRAVENOUS | Status: AC
  Administered 2012-10-27: 2 mg
  Filled 2012-10-27: qty 2

## 2012-10-27 MED ORDER — IOHEXOL 300 MG/ML  SOLN
100.0000 mL | Freq: Once | INTRAMUSCULAR | Status: AC | PRN
  Administered 2012-10-27: 50 mL via INTRAVENOUS

## 2012-10-27 NOTE — ED Notes (Signed)
Pt not receiving sedation as he is riding public transportation

## 2012-10-27 NOTE — H&P (Signed)
Agree 

## 2012-10-27 NOTE — H&P (Signed)
Julian [name] is an 67 y.o. male.   Chief Complaint: left arm dialysis fistula clotted Last use 6/18- good flow Clotted 6/20 Left arm fistula placed 12/2011- Dr Imogene Burn Has never had intervention Scheduled for thrombolysis and possible angioplasty/stent Possible dialysis catheter placement if needed HPI: ESRD; HTN; DM  Past Medical History  Diagnosis Date  . Hypertension   . Diabetes mellitus   . Peripheral vascular disease   . Obesity   . Chronic back pain   . End stage renal disease     on Hemodialysis  . ESRD on hemodialysis   . Chronic diarrhea     Past Surgical History  Procedure Laterality Date  . Above knee leg amputation      Right AKA  . Arteriovenous graft placement    . Pr vein bypass graft,aorto-fem-pop    . Insertion of dialysis catheter  07/30/2011    Procedure: INSERTION OF DIALYSIS CATHETER;  Surgeon: Larina Earthly, MD;  Location: [facility] OR;  Service: Vascular;  Laterality: Right;  removal of right dialysis catheter and exchange/ insertion of new right dialysis catheter  . Colonoscopy  Dec 2007    RMR: normal rectum, colonic polyps with ascending colon polyp actively oozing, s/p hot snare polypectomy: tubulovillous adenoma, adenomatous polyps   . Av fistula placement    . Bascilic vein transposition  08/24/2011    Left upper arm  . Eye surgery      'not sure what kind'  . Av fistula placement  01/04/2012    Procedure: INSERTION OF ARTERIOVENOUS (AV) GORE-TEX GRAFT ARM;  Surgeon: Fransisco Hertz, MD;  Location: [facility] OR;  Service: Vascular;  Laterality: Left;  4 x 7mm stretch graft implanted in left upper arm  . Colonoscopy N/A 07/13/2012    Procedure: COLONOSCOPY;  Surgeon: Corbin Ade, MD;  Location: [facility];  Service: Endoscopy;  Laterality: N/A;  10:00    Family History  Problem Relation Age of Onset  . Hypertension Mother   . Diabetes Father   . Anesthesia problems Neg Hx   . Colon cancer Neg Hx    Social History:  reports that he has never smoked. He  has never used smokeless tobacco. He reports that he does not drink alcohol or use illicit drugs.  Allergies: No Known Allergies   (Not in a hospital admission)  Results for orders placed during the hospital encounter of 10/27/12 (from the past 48 hour(s))  GLUCOSE, CAPILLARY     Status: None   Collection Time    10/27/12  1:12 PM      Result Value Range   Glucose-Capillary 88  70 - 99 mg/dL   No results found.  Review of Systems  Constitutional: Negative for fever.  Respiratory: Negative for shortness of breath.   Cardiovascular: Negative for chest pain.  Gastrointestinal: Negative for nausea, vomiting and abdominal pain.  Neurological: Negative for dizziness and headaches.    Blood pressure 186/76, pulse 86, resp. rate 16, SpO2 99.00%. Physical Exam  Constitutional: He is oriented to person, place, and time. He appears well-nourished.  Cardiovascular: Normal rate, regular rhythm and normal heart sounds.   No murmur heard. Respiratory: He has wheezes.  GI: Soft. There is no tenderness.  Musculoskeletal: Normal range of motion.  Rt leg aka  Neurological: He is alert and oriented to person, place, and time.  Skin: Skin is warm and dry.  Psychiatric: He has a normal mood and affect. His behavior is normal. Judgment and thought content  normal.     Assessment/Plan Left arm dialysis fistula clotted Scheduled for thrombolysis and poss pta/stent. Possible dialysis catheter if needed Pt aware of procedure benefits and risks and agreeable to proceed Consent signed and in chart  Massa Pe A 10/27/2012, 1:36 PM

## 2012-10-27 NOTE — Procedures (Signed)
Procedure:  Declot and angioplasty of left arm dialysis shunt Findings:  Tight stenosis of venous anastamosis.  Graft widely patent after thrombectomy and angioplasty.

## 2012-10-31 ENCOUNTER — Ambulatory Visit (INDEPENDENT_AMBULATORY_CARE_PROVIDER_SITE_OTHER): Payer: PRIVATE HEALTH INSURANCE | Admitting: Family Medicine

## 2012-10-31 ENCOUNTER — Ambulatory Visit: Payer: PRIVATE HEALTH INSURANCE | Admitting: Family Medicine

## 2012-10-31 VITALS — BP 134/82 | HR 78 | Temp 97.3°F | Resp 20

## 2012-10-31 DIAGNOSIS — E119 Type 2 diabetes mellitus without complications: Secondary | ICD-10-CM

## 2012-10-31 DIAGNOSIS — M549 Dorsalgia, unspecified: Secondary | ICD-10-CM

## 2012-10-31 MED ORDER — OXYCODONE-ACETAMINOPHEN 5-325 MG PO TABS: 1.0000 | ORAL_TABLET | Freq: Two times a day (BID) | ORAL | Status: DC | PRN

## 2012-10-31 NOTE — Progress Notes (Signed)
  Subjective:    Patient ID: Julian [name], male    DOB: 1946-03-12, 67 y.o.   MRN: [id number]  HPI Patient here for interim visit for diabetes mellitus. He was evaluated in emergency room after his wheelchair broke down and he had to manually wall himself with his left leg as he has a right BKA. He was evaluated in the ER secondary to severe pain in his leg after which this was pronounced is a muscle strain it has improved but he occasionally gets some twinges of pain when he transfers. He states his pain medication does not last time and does not seem to be strong enough for his back or leg pain he's even try taking 3 a day with no relief.  Diabetes mellitus he is currently injecting 15 units of Lantus his blood sugars never go above 88 he checks them fasting. He still gets jittery and sweaty at times. His A1c was down to 5.6% on our last check  Review of Systems - per above  GEN- denies fatigue, fever, weight loss,weakness, recent illness CVS- denies chest pain, palpitations RESP- denies SOB, cough, wheeze MSK- + joint pain, muscle aches, injury Neuro- denies headache, dizziness, syncope, seizure activity       Objective:   Physical Exam GEN- NAD, alert and oriented x3, wheelchair CVS- RRR, no murmur RESP-CTAB EXT- LLE + edema,  Right BKA Pulses- Radial 2+, DP- 1+        Assessment & Plan:

## 2012-10-31 NOTE — Assessment & Plan Note (Signed)
Chronic back pain, worsened by recent events with his wheelchair After discussion with patient about his use and control I will change him to percocet, he will need to come monthly to pick up prescription

## 2012-10-31 NOTE — Patient Instructions (Addendum)
Stop the insulin  Continue your other medications Call if your blood sugars are >150  Pain medication changed to percocet- take twice a day F/U 3 months

## 2012-10-31 NOTE — Assessment & Plan Note (Signed)
Will d/c lantus, A1C 5.6% and monitor

## 2012-11-06 ENCOUNTER — Encounter (HOSPITAL_COMMUNITY): Payer: Self-pay

## 2012-11-06 ENCOUNTER — Telehealth (HOSPITAL_COMMUNITY): Payer: Self-pay | Admitting: *Deleted

## 2012-11-06 ENCOUNTER — Other Ambulatory Visit (HOSPITAL_COMMUNITY): Payer: Self-pay | Admitting: Nephrology

## 2012-11-06 ENCOUNTER — Ambulatory Visit (HOSPITAL_COMMUNITY)
Admission: RE | Admit: 2012-11-06 | Discharge: 2012-11-06 | Disposition: A | Discharge status: Home / Self Care | Payer: PRIVATE HEALTH INSURANCE | Source: Ambulatory Visit | Attending: Nephrology | Admitting: Nephrology

## 2012-11-06 DIAGNOSIS — E119 Type 2 diabetes mellitus without complications: Secondary | ICD-10-CM | POA: Insufficient documentation

## 2012-11-06 DIAGNOSIS — N186 End stage renal disease: Secondary | ICD-10-CM

## 2012-11-06 DIAGNOSIS — Z992 Dependence on renal dialysis: Secondary | ICD-10-CM | POA: Insufficient documentation

## 2012-11-06 DIAGNOSIS — I871 Compression of vein: Secondary | ICD-10-CM | POA: Insufficient documentation

## 2012-11-06 DIAGNOSIS — T82898A Other specified complication of vascular prosthetic devices, implants and grafts, initial encounter: Secondary | ICD-10-CM | POA: Insufficient documentation

## 2012-11-06 DIAGNOSIS — I739 Peripheral vascular disease, unspecified: Secondary | ICD-10-CM | POA: Insufficient documentation

## 2012-11-06 DIAGNOSIS — S78119A Complete traumatic amputation at level between unspecified hip and knee, initial encounter: Secondary | ICD-10-CM | POA: Insufficient documentation

## 2012-11-06 DIAGNOSIS — E669 Obesity, unspecified: Secondary | ICD-10-CM | POA: Insufficient documentation

## 2012-11-06 DIAGNOSIS — I120 Hypertensive chronic kidney disease with stage 5 chronic kidney disease or end stage renal disease: Secondary | ICD-10-CM | POA: Insufficient documentation

## 2012-11-06 DIAGNOSIS — Y832 Surgical operation with anastomosis, bypass or graft as the cause of abnormal reaction of the patient, or of later complication, without mention of misadventure at the time of the procedure: Secondary | ICD-10-CM | POA: Insufficient documentation

## 2012-11-06 MED ORDER — HEPARIN SODIUM (PORCINE) 1000 UNIT/ML IJ SOLN
INTRAMUSCULAR | Status: AC
  Administered 2012-11-06: 3000 [IU]
  Filled 2012-11-06: qty 1

## 2012-11-06 MED ORDER — ALTEPLASE 100 MG IV SOLR
4.0000 mg | Freq: Once | INTRAVENOUS | Status: AC
  Administered 2012-11-06: 4 mg
  Filled 2012-11-06: qty 4

## 2012-11-06 MED ORDER — IOHEXOL 300 MG/ML  SOLN
100.0000 mL | Freq: Once | INTRAMUSCULAR | Status: AC | PRN
  Administered 2012-11-06: 40 mL via INTRAVENOUS

## 2012-11-06 NOTE — H&P (Signed)
Julian [name] is an 67 y.o. male.   Chief Complaint: Clotted Left upper arm dialysis graft Last use 11/03/12- good Last intervention: 10/27/12- good result- declot Clotted this am Scheduled now for left arm dialysis graft thrombolysis and possible angioplasty/stent placement Possible dialysis catheter placement if needed HPI: HTN; ESRD; DM; PVD  Past Medical History  Diagnosis Date  . Hypertension   . Diabetes mellitus   . Peripheral vascular disease   . Obesity   . Chronic back pain   . End stage renal disease     on Hemodialysis  . ESRD on hemodialysis   . Chronic diarrhea     Past Surgical History  Procedure Laterality Date  . Above knee leg amputation      Right AKA  . Arteriovenous graft placement    . Pr vein bypass graft,aorto-fem-pop    . Insertion of dialysis catheter  07/30/2011    Procedure: INSERTION OF DIALYSIS CATHETER;  Surgeon: Larina Earthly, MD;  Location: [facility] OR;  Service: Vascular;  Laterality: Right;  removal of right dialysis catheter and exchange/ insertion of new right dialysis catheter  . Colonoscopy  Dec 2007    RMR: normal rectum, colonic polyps with ascending colon polyp actively oozing, s/p hot snare polypectomy: tubulovillous adenoma, adenomatous polyps   . Av fistula placement    . Bascilic vein transposition  08/24/2011    Left upper arm  . Eye surgery      'not sure what kind'  . Av fistula placement  01/04/2012    Procedure: INSERTION OF ARTERIOVENOUS (AV) GORE-TEX GRAFT ARM;  Surgeon: Fransisco Hertz, MD;  Location: [facility] OR;  Service: Vascular;  Laterality: Left;  4 x 7mm stretch graft implanted in left upper arm  . Colonoscopy N/A 07/13/2012    Procedure: COLONOSCOPY;  Surgeon: Corbin Ade, MD;  Location: [facility];  Service: Endoscopy;  Laterality: N/A;  10:00    Family History  Problem Relation Age of Onset  . Hypertension Mother   . Diabetes Father   . Anesthesia problems Neg Hx   . Colon cancer Neg Hx    Social History:  reports  that he has never smoked. He has never used smokeless tobacco. He reports that he does not drink alcohol or use illicit drugs.  Allergies: No Known Allergies   (Not in a hospital admission)  No results found for this or any previous visit (from the past 48 hour(s)). No results found.  Review of Systems  Constitutional: Negative for fever.  Respiratory: Negative for shortness of breath.   Cardiovascular: Negative for chest pain.  Gastrointestinal: Negative for nausea, vomiting and abdominal pain.  Neurological: Negative for headaches.    Blood pressure 170/88, pulse 73, temperature 97.6 F (36.4 C), resp. rate 20, SpO2 98.00%. Physical Exam  Constitutional: He is oriented to person, place, and time. He appears well-nourished.  Cardiovascular: Normal rate, regular rhythm and normal heart sounds.   No murmur heard. Respiratory: Effort normal and breath sounds normal. He has no wheezes.  GI: Soft. Bowel sounds are normal. There is no tenderness.  Musculoskeletal:  R AKA  Neurological: He is alert and oriented to person, place, and time.  Skin: Skin is warm and dry.  Psychiatric: He has a normal mood and affect. His behavior is normal.     Assessment/Plan Clotted left upper arm dialysis graft  Scheduled for thrombolysis and possible pta/stent placement Poss dialysis catheter placement if needed Consent signed and in chart  [facility]  A 11/06/2012, 2:50 PM

## 2012-11-06 NOTE — Procedures (Signed)
Technically successful declot of left upper arm dialysis graft.  No immediate post procedural complications.  

## 2012-11-06 NOTE — ED Notes (Signed)
Pt not receiving sedation

## 2012-11-16 ENCOUNTER — Other Ambulatory Visit: Payer: Self-pay

## 2012-11-17 ENCOUNTER — Ambulatory Visit (HOSPITAL_COMMUNITY)
Admission: RE | Admit: 2012-11-17 | Discharge: 2012-11-17 | Disposition: A | Discharge status: Home / Self Care | Payer: PRIVATE HEALTH INSURANCE | Source: Ambulatory Visit | Attending: Nephrology | Admitting: Nephrology

## 2012-11-17 ENCOUNTER — Encounter (HOSPITAL_COMMUNITY): Payer: Self-pay

## 2012-11-17 ENCOUNTER — Other Ambulatory Visit (HOSPITAL_COMMUNITY): Payer: Self-pay | Admitting: Nephrology

## 2012-11-17 VITALS — BP 158/74 | HR 73 | Resp 16

## 2012-11-17 DIAGNOSIS — R197 Diarrhea, unspecified: Secondary | ICD-10-CM | POA: Insufficient documentation

## 2012-11-17 DIAGNOSIS — N186 End stage renal disease: Secondary | ICD-10-CM

## 2012-11-17 DIAGNOSIS — E119 Type 2 diabetes mellitus without complications: Secondary | ICD-10-CM | POA: Insufficient documentation

## 2012-11-17 DIAGNOSIS — Y832 Surgical operation with anastomosis, bypass or graft as the cause of abnormal reaction of the patient, or of later complication, without mention of misadventure at the time of the procedure: Secondary | ICD-10-CM | POA: Insufficient documentation

## 2012-11-17 DIAGNOSIS — T82898A Other specified complication of vascular prosthetic devices, implants and grafts, initial encounter: Secondary | ICD-10-CM | POA: Insufficient documentation

## 2012-11-17 DIAGNOSIS — M549 Dorsalgia, unspecified: Secondary | ICD-10-CM | POA: Insufficient documentation

## 2012-11-17 DIAGNOSIS — I871 Compression of vein: Secondary | ICD-10-CM | POA: Insufficient documentation

## 2012-11-17 DIAGNOSIS — I120 Hypertensive chronic kidney disease with stage 5 chronic kidney disease or end stage renal disease: Secondary | ICD-10-CM | POA: Insufficient documentation

## 2012-11-17 DIAGNOSIS — Z951 Presence of aortocoronary bypass graft: Secondary | ICD-10-CM | POA: Insufficient documentation

## 2012-11-17 DIAGNOSIS — G8929 Other chronic pain: Secondary | ICD-10-CM | POA: Insufficient documentation

## 2012-11-17 DIAGNOSIS — S78119A Complete traumatic amputation at level between unspecified hip and knee, initial encounter: Secondary | ICD-10-CM | POA: Insufficient documentation

## 2012-11-17 DIAGNOSIS — I739 Peripheral vascular disease, unspecified: Secondary | ICD-10-CM | POA: Insufficient documentation

## 2012-11-17 DIAGNOSIS — I82609 Acute embolism and thrombosis of unspecified veins of unspecified upper extremity: Secondary | ICD-10-CM | POA: Insufficient documentation

## 2012-11-17 DIAGNOSIS — E669 Obesity, unspecified: Secondary | ICD-10-CM | POA: Insufficient documentation

## 2012-11-17 MED ORDER — IOHEXOL 300 MG/ML  SOLN
100.0000 mL | Freq: Once | INTRAMUSCULAR | Status: AC | PRN
  Administered 2012-11-17: 50 mL via INTRAVENOUS

## 2012-11-17 MED ORDER — HEPARIN SODIUM (PORCINE) 1000 UNIT/ML IJ SOLN
INTRAMUSCULAR | Status: AC
  Filled 2012-11-17: qty 1

## 2012-11-17 MED ORDER — ALTEPLASE 100 MG IV SOLR
2.0000 mg | Freq: Once | INTRAVENOUS | Status: AC
  Administered 2012-11-17: 2 mg
  Filled 2012-11-17: qty 2

## 2012-11-17 MED ORDER — HEPARIN SODIUM (PORCINE) 1000 UNIT/ML IJ SOLN
INTRAMUSCULAR | Status: DC | PRN
  Administered 2012-11-17: 3000 [IU] via INTRAVENOUS

## 2012-11-17 NOTE — Procedures (Signed)
Successful LUE AVG DECLOT W/ PTA NO COMP STABLE READY FOR USE

## 2012-11-17 NOTE — H&P (Signed)
Julian [name] is an 67 y.o. male.   Chief Complaint: Left upper arm dialysis graft clotted Last use: 7/9 Clotted 7/11 Last thrombolysis 11/06/12: successful Scheduled now for thrombolysis and possible angioplasty/stent. Possible dialysis catheter if needed HPI: ESRD; HTN; DM; PVD  Past Medical History  Diagnosis Date  . Hypertension   . Diabetes mellitus   . Peripheral vascular disease   . Obesity   . Chronic back pain   . End stage renal disease     on Hemodialysis  . ESRD on hemodialysis   . Chronic diarrhea     Past Surgical History  Procedure Laterality Date  . Above knee leg amputation      Right AKA  . Arteriovenous graft placement    . Pr vein bypass graft,aorto-fem-pop    . Insertion of dialysis catheter  07/30/2011    Procedure: INSERTION OF DIALYSIS CATHETER;  Surgeon: Larina Earthly, MD;  Location: [facility] OR;  Service: Vascular;  Laterality: Right;  removal of right dialysis catheter and exchange/ insertion of new right dialysis catheter  . Colonoscopy  Dec 2007    RMR: normal rectum, colonic polyps with ascending colon polyp actively oozing, s/p hot snare polypectomy: tubulovillous adenoma, adenomatous polyps   . Av fistula placement    . Bascilic vein transposition  08/24/2011    Left upper arm  . Eye surgery      'not sure what kind'  . Av fistula placement  01/04/2012    Procedure: INSERTION OF ARTERIOVENOUS (AV) GORE-TEX GRAFT ARM;  Surgeon: Fransisco Hertz, MD;  Location: [facility] OR;  Service: Vascular;  Laterality: Left;  4 x 7mm stretch graft implanted in left upper arm  . Colonoscopy N/A 07/13/2012    Procedure: COLONOSCOPY;  Surgeon: Corbin Ade, MD;  [facility];  Service: Endoscopy;  Laterality: N/A;  10:00    Family History  Problem Relation Age of Onset  . Hypertension Mother   . Diabetes Father   . Anesthesia problems Neg Hx   . Colon cancer Neg Hx    Social History:  reports that he has never smoked. He has never used smokeless tobacco. He  reports that he does not drink alcohol or use illicit drugs.  Allergies: No Known Allergies   (Not in a hospital admission)  No results found for this or any previous visit (from the past 48 hour(s)). No results found.  Review of Systems  Constitutional: Negative for fever.  Eyes: Negative for double vision.  Respiratory: Negative for shortness of breath.   Cardiovascular: Negative for chest pain.  Gastrointestinal: Negative for nausea, vomiting and abdominal pain.  Neurological: Positive for weakness. Negative for headaches.    Blood pressure 182/82, pulse 75, SpO2 100.00%. Physical Exam  Constitutional: He is oriented to person, place, and time.  Cardiovascular: Normal rate, regular rhythm and normal heart sounds.   No murmur heard. Respiratory: Effort normal and breath sounds normal. He has no wheezes.  GI: Soft. Bowel sounds are normal. There is no tenderness.  Musculoskeletal: Normal range of motion.  R AKA; uses wc  Neurological: He is alert and oriented to person, place, and time.  Psychiatric: He has a normal mood and affect. His behavior is normal.     Assessment/Plan Clotted Left upper arm dialysis graft Scheduled for thrombolysis and poss pta/stent Poss dialysis cath if needed Pt aware of procedure benefits and risks and agreeable to proceed Consent signed and in chart  Makita Blow A 11/17/2012, 11:44 AM

## 2012-11-17 NOTE — ED Notes (Signed)
Escorted to front entrance via pts own w/c

## 2012-11-24 ENCOUNTER — Other Ambulatory Visit (HOSPITAL_COMMUNITY): Payer: Self-pay | Admitting: Nephrology

## 2012-11-24 ENCOUNTER — Encounter (HOSPITAL_COMMUNITY): Payer: Self-pay

## 2012-11-24 ENCOUNTER — Ambulatory Visit (HOSPITAL_COMMUNITY)
Admission: RE | Admit: 2012-11-24 | Discharge: 2012-11-24 | Disposition: A | Discharge status: Home / Self Care | Payer: PRIVATE HEALTH INSURANCE | Source: Ambulatory Visit | Attending: Nephrology | Admitting: Nephrology

## 2012-11-24 DIAGNOSIS — G8929 Other chronic pain: Secondary | ICD-10-CM | POA: Insufficient documentation

## 2012-11-24 DIAGNOSIS — Y832 Surgical operation with anastomosis, bypass or graft as the cause of abnormal reaction of the patient, or of later complication, without mention of misadventure at the time of the procedure: Secondary | ICD-10-CM | POA: Insufficient documentation

## 2012-11-24 DIAGNOSIS — Z992 Dependence on renal dialysis: Secondary | ICD-10-CM

## 2012-11-24 DIAGNOSIS — T82898A Other specified complication of vascular prosthetic devices, implants and grafts, initial encounter: Secondary | ICD-10-CM | POA: Insufficient documentation

## 2012-11-24 DIAGNOSIS — I120 Hypertensive chronic kidney disease with stage 5 chronic kidney disease or end stage renal disease: Secondary | ICD-10-CM | POA: Insufficient documentation

## 2012-11-24 DIAGNOSIS — E669 Obesity, unspecified: Secondary | ICD-10-CM | POA: Insufficient documentation

## 2012-11-24 DIAGNOSIS — I739 Peripheral vascular disease, unspecified: Secondary | ICD-10-CM | POA: Insufficient documentation

## 2012-11-24 DIAGNOSIS — N186 End stage renal disease: Secondary | ICD-10-CM | POA: Insufficient documentation

## 2012-11-24 DIAGNOSIS — R197 Diarrhea, unspecified: Secondary | ICD-10-CM | POA: Insufficient documentation

## 2012-11-24 DIAGNOSIS — E119 Type 2 diabetes mellitus without complications: Secondary | ICD-10-CM | POA: Insufficient documentation

## 2012-11-24 DIAGNOSIS — M549 Dorsalgia, unspecified: Secondary | ICD-10-CM | POA: Insufficient documentation

## 2012-11-24 DIAGNOSIS — S78119A Complete traumatic amputation at level between unspecified hip and knee, initial encounter: Secondary | ICD-10-CM | POA: Insufficient documentation

## 2012-11-24 LAB — POTASSIUM: Potassium: 4.9 mEq/L (ref 3.5–5.1)

## 2012-11-24 MED ORDER — CEFAZOLIN SODIUM-DEXTROSE 2-3 GM-% IV SOLR
INTRAVENOUS | Status: AC
  Administered 2012-11-24: 2000 mg via INTRAVENOUS
  Filled 2012-11-24: qty 50

## 2012-11-24 MED ORDER — HEPARIN SODIUM (PORCINE) 1000 UNIT/ML IJ SOLN
INTRAMUSCULAR | Status: AC
  Administered 2012-11-24: 14:00:00
  Filled 2012-11-24: qty 1

## 2012-11-24 NOTE — Procedures (Signed)
Interventional Radiology Procedure Note  Procedure: Placement of a right IJ approach tunneled HD catheter.  Tip in the mid RA and ready for use. Complications: None Recommendations: - Routine line care  Signed,  Sterling Big, MD Vascular & Interventional Radiologist [facility]

## 2012-11-24 NOTE — Progress Notes (Signed)
Dressing over HD catheter is clean, dry and intact.  Pt discharged home

## 2012-11-24 NOTE — H&P (Signed)
Julian [name] is an 67 y.o. male.   Chief Complaint: recurrent clotting Left upper extremity dialysis graft Thrombolysis: 7/11; 6/30; 6;20 Clotted now Scheduled for tunneled dialysis catheter per Renal MD HPI: ESRD; HTN; DM; PVD  Past Medical History  Diagnosis Date  . Hypertension   . Diabetes mellitus   . Peripheral vascular disease   . Obesity   . Chronic back pain   . End stage renal disease     on Hemodialysis  . ESRD on hemodialysis   . Chronic diarrhea     Past Surgical History  Procedure Laterality Date  . Above knee leg amputation      Right AKA  . Arteriovenous graft placement    . Pr vein bypass graft,aorto-fem-pop    . Insertion of dialysis catheter  07/30/2011    Procedure: INSERTION OF DIALYSIS CATHETER;  Surgeon: Larina Earthly, MD;  Location: [facility] OR;  Service: Vascular;  Laterality: Right;  removal of right dialysis catheter and exchange/ insertion of new right dialysis catheter  . Colonoscopy  Dec 2007    RMR: normal rectum, colonic polyps with ascending colon polyp actively oozing, s/p hot snare polypectomy: tubulovillous adenoma, adenomatous polyps   . Av fistula placement    . Bascilic vein transposition  08/24/2011    Left upper arm  . Eye surgery      'not sure what kind'  . Av fistula placement  01/04/2012    Procedure: INSERTION OF ARTERIOVENOUS (AV) GORE-TEX GRAFT ARM;  Surgeon: Fransisco Hertz, MD;  Location: [facility] OR;  Service: Vascular;  Laterality: Left;  4 x 7mm stretch graft implanted in left upper arm  . Colonoscopy N/A 07/13/2012    Procedure: COLONOSCOPY;  Surgeon: Corbin Ade, MD;  [facility];  Service: Endoscopy;  Laterality: N/A;  10:00    Family History  Problem Relation Age of Onset  . Hypertension Mother   . Diabetes Father   . Anesthesia problems Neg Hx   . Colon cancer Neg Hx    Social History:  reports that he has never smoked. He has never used smokeless tobacco. He reports that he does not drink alcohol or use illicit  drugs.  Allergies: No Known Allergies   (Not in a hospital admission)  No results found for this or any previous visit (from the past 48 hour(s)). No results found.  Review of Systems  Constitutional: Negative for fever.  Respiratory: Negative for shortness of breath.   Cardiovascular: Negative for chest pain.  Gastrointestinal: Negative for vomiting, abdominal pain and diarrhea.  Neurological: Negative for weakness.    Blood pressure 165/86, temperature 98 F (36.7 C), resp. rate 18, SpO2 99.00%. Physical Exam  Constitutional: He is oriented to person, place, and time. He appears well-nourished.  Cardiovascular: Normal rate, regular rhythm and normal heart sounds.   No murmur heard. Respiratory: Effort normal and breath sounds normal. He has no wheezes.  GI: Soft. Bowel sounds are normal. There is no tenderness.  Neurological: He is alert and oriented to person, place, and time.     Assessment/Plan Clotted L arm dialysis graft Thrombolysis x 3 in last 2 months Scheduled now for tunneled HD catheter Pt aware of procedure benefits and risks and agreeable to proceed Consent signed and in chart  Alta Goding A 11/24/2012, 12:06 PM

## 2012-11-24 NOTE — H&P (Signed)
Agree with PA note.  Signed,  Hayato Guaman K. Sriansh Farra, MD [facility] Interventional [facility]iology [facility]  

## 2012-11-27 ENCOUNTER — Telehealth (HOSPITAL_COMMUNITY): Payer: Self-pay | Admitting: *Deleted

## 2012-11-29 ENCOUNTER — Telehealth: Payer: Self-pay | Admitting: Family Medicine

## 2012-11-29 NOTE — Telephone Encounter (Signed)
Okay to refill, he has to pick up

## 2012-11-29 NOTE — Telephone Encounter (Signed)
Called pt no answer on both numbers 

## 2012-11-29 NOTE — Telephone Encounter (Signed)
Pt wants refill on percocet °

## 2012-11-30 ENCOUNTER — Telehealth: Payer: Self-pay | Admitting: Family Medicine

## 2012-11-30 ENCOUNTER — Telehealth: Payer: Self-pay

## 2012-11-30 DIAGNOSIS — N186 End stage renal disease: Secondary | ICD-10-CM

## 2012-11-30 DIAGNOSIS — T82898A Other specified complication of vascular prosthetic devices, implants and grafts, initial encounter: Secondary | ICD-10-CM

## 2012-11-30 NOTE — Telephone Encounter (Signed)
Nurse called from the dialysis center to report pt. Recently had a clotted left upper arm AVG on 11/24/12, and had a right IJ catheter placed for HD.  Reports pt. Has had thrombolysis of left arm AVG on 10/27/12, 11/06/12, and 11/17/12; states the Interventional Radilolgist recommends to have the surgeon reeval. For surgical revision or to place new HD access.  Discussed with Dr. Darrick Penna.  Rec'd new order for right upper extremity vein mapping and office visit to evaluate hemodialysis access.

## 2012-12-01 MED ORDER — OXYCODONE-ACETAMINOPHEN 5-325 MG PO TABS: 1.0000 | ORAL_TABLET | Freq: Two times a day (BID) | ORAL | Status: DC | PRN

## 2012-12-01 NOTE — Telephone Encounter (Signed)
Pt aware Rx ready and must pick up at office

## 2012-12-01 NOTE — Telephone Encounter (Signed)
He can have this refilled, he has to pick up, Percocet can not be called in, i discussed this with him last visit

## 2012-12-06 ENCOUNTER — Encounter: Payer: Self-pay | Admitting: Vascular Surgery

## 2012-12-07 ENCOUNTER — Other Ambulatory Visit: Payer: Self-pay | Admitting: *Deleted

## 2012-12-07 ENCOUNTER — Encounter: Payer: Self-pay | Admitting: Vascular Surgery

## 2012-12-07 ENCOUNTER — Ambulatory Visit (INDEPENDENT_AMBULATORY_CARE_PROVIDER_SITE_OTHER): Payer: Medicaid Other | Admitting: Vascular Surgery

## 2012-12-07 ENCOUNTER — Encounter (INDEPENDENT_AMBULATORY_CARE_PROVIDER_SITE_OTHER): Payer: Medicaid Other | Admitting: *Deleted

## 2012-12-07 ENCOUNTER — Encounter: Payer: Self-pay | Admitting: *Deleted

## 2012-12-07 VITALS — BP 132/90 | HR 70 | Resp 16 | Ht 70.0 in | Wt 255.0 lb

## 2012-12-07 DIAGNOSIS — N184 Chronic kidney disease, stage 4 (severe): Secondary | ICD-10-CM

## 2012-12-07 DIAGNOSIS — Z01810 Encounter for preprocedural cardiovascular examination: Secondary | ICD-10-CM

## 2012-12-07 DIAGNOSIS — N186 End stage renal disease: Secondary | ICD-10-CM

## 2012-12-07 NOTE — Progress Notes (Signed)
[facility] [place] HISTORY AND PHYSICAL   History of Present Illness:  Patient is a 67 y.o. year old male who presents for placement of a permanent hemodialysis access. The patient is right handed .  The patient is currently on hemodialysis.  The cause of renal failure is thought to be secondary to multiple factors hypertension diabetes.  Other chronic medical problems include hypertension, diabetes end-stage renal disease Tuesday Thursday Saturday dialysis. He has previously had a left basilic vein transposition fistula, right brachiocephalic fistula, left upper arm AV graft.  Past Medical History  Diagnosis Date  . Hypertension   . Diabetes mellitus   . Peripheral vascular disease   . Obesity   . Chronic back pain   . End stage renal disease     on Hemodialysis  . ESRD on hemodialysis   . Chronic diarrhea     Past Surgical History  Procedure Laterality Date  . Above knee leg amputation      Right AKA  . Arteriovenous graft placement    . Pr vein bypass graft,aorto-fem-pop    . Insertion of dialysis catheter  07/30/2011    Procedure: INSERTION OF DIALYSIS CATHETER;  Surgeon: Larina Earthly, MD;  Location: [facility] OR;  Service: Vascular;  Laterality: Right;  removal of right dialysis catheter and exchange/ insertion of new right dialysis catheter  . Colonoscopy  Dec 2007    RMR: normal rectum, colonic polyps with ascending colon polyp actively oozing, s/p hot snare polypectomy: tubulovillous adenoma, adenomatous polyps   . Av fistula placement    . Bascilic vein transposition  08/24/2011    Left upper arm  . Eye surgery      'not sure what kind'  . Av fistula placement  01/04/2012    Procedure: INSERTION OF ARTERIOVENOUS (AV) GORE-TEX GRAFT ARM;  Surgeon: Fransisco Hertz, MD;  Location: [facility] OR;  Service: Vascular;  Laterality: Left;  4 x 7mm stretch graft implanted in left upper arm  . Colonoscopy N/A 07/13/2012    Procedure: COLONOSCOPY;  Surgeon: Corbin Ade, MD;   Location: [facility];  Service: Endoscopy;  Laterality: N/A;  10:00     Social History History  Substance Use Topics  . Smoking status: Never Smoker   . Smokeless tobacco: Never Used  . Alcohol Use: No    Family History Family History  Problem Relation Age of Onset  . Hypertension Mother   . Diabetes Father   . Anesthesia problems Neg Hx   . Colon cancer Neg Hx     Allergies  No Known Allergies   Current Outpatient Prescriptions  Medication Sig Dispense Refill  . cinacalcet (SENSIPAR) 60 MG tablet Take 120 mg by mouth daily. Takes 2 tablets at lunch      . diltiazem (CARDIZEM CD) 180 MG 24 hr capsule Take 180 mg by mouth daily.       . diphenoxylate-atropine (LOMOTIL) 2.5-0.025 MG per tablet Take 1 tablet by mouth 3 (three) times daily as needed. For Diarrhea      . hydrALAZINE (APRESOLINE) 50 MG tablet Take 50 mg by mouth 3 (three) times daily.       Marland Kitchen ibuprofen (ADVIL,MOTRIN) 400 MG tablet Take 1 tablet (400 mg total) by mouth every 6 (six) hours as needed for pain.  30 tablet  0  . loratadine (CLARITIN) 10 MG tablet Take 10 mg by mouth daily.      . methocarbamol (ROBAXIN) 500 MG tablet Take 1 tablet (500  mg total) by mouth 2 (two) times daily.  20 tablet  0  . multivitamin (RENA-VIT) TABS tablet Take 1 tablet by mouth daily.      Marland Kitchen oxyCODONE-acetaminophen (ROXICET) 5-325 MG per tablet Take 1 tablet by mouth 2 (two) times daily as needed for pain.  60 tablet  0  . sevelamer (RENAGEL) 800 MG tablet Take 2,400-4,000 mg by mouth 2 (two) times daily with a meal. Take 5 tabs (4000mg ) 2 times daily with meals and 3 tabs (2400mg ) as needed with snacks.       No current facility-administered medications for this visit.    ROS:   General:  No weight loss, Fever, chills  HEENT: No recent headaches, no nasal bleeding, no visual changes, no sore throat  Neurologic: No dizziness, blackouts, seizures. No recent symptoms of stroke or mini- stroke. No recent episodes of slurred  speech, or temporary blindness.  Cardiac: No recent episodes of chest pain/pressure, no shortness of breath at rest.  No shortness of breath with exertion.  Denies history of atrial fibrillation or irregular heartbeat  Vascular: No history of rest pain in feet.  No history of claudication.  No history of non-healing ulcer, No history of DVT   Pulmonary: No home oxygen, no productive cough, no hemoptysis,  No asthma or wheezing  Musculoskeletal:  [ ]  Arthritis, [ ]  Low back pain,  [ ]  Joint pain  Hematologic:No history of hypercoagulable state.  No history of easy bleeding.  No history of anemia  Gastrointestinal: No hematochezia or melena,  No gastroesophageal reflux, no trouble swallowing  Urinary: [ ]  chronic Kidney disease, [ ]  on HD - [ ]  MWF or [ ]  TTHS, [ ]  Burning with urination, [ ]  Frequent urination, [ ]  Difficulty urinating;   Skin: No rashes  Psychological: No history of anxiety,  No history of depression   Physical Examination  Filed Vitals:   12/07/12 1204  BP: 132/90  Pulse: 70  Resp: 16  Height: 5\' 10"  (1.778 m)  Weight: 255 lb (115.667 kg)  SpO2: 100%    Body mass index is 36.59 kg/(m^2).  General:  Alert and oriented, no acute distress HEENT: Normal Neck: No bruit or JVD Pulmonary: Clear to auscultation bilaterally Cardiac: Regular Rate and Rhythm without murmur Gastrointestinal: Soft, non-tender, non-distended, no mass, no scars Skin: No rash Extremity Pulses:  1+ radial, brachial pulses bilaterally, right above-knee amputation Musculoskeletal: No deformity or edema  Neurologic: Upper and lower extremity motor 5/5 and symmetric  DATA: Vein mapping ultrasound today shows no suitable cephalic vein, basilic vein is marginal size less than 3 mm   ASSESSMENT:   Needs hemodialysis access   PLAN:  Discussed with patient the possibility of a staged basilic vein transposition fistula but also described the vein was fairly small and this may not mature. At  this point he has opted for a right upper arm AV graft. Is scheduled for August 18. Risks benefits possible complications and procedure details were discussed with the patient today including but not limited to bleeding infection graft thrombosis ischemic steal he understands and agrees to proceed.  Fabienne Bruns, MD [facility] [place] Office: [phone] Pager: [phone]

## 2012-12-10 ENCOUNTER — Other Ambulatory Visit: Payer: Self-pay | Admitting: Family Medicine

## 2012-12-11 ENCOUNTER — Telehealth: Payer: Self-pay | Admitting: Family Medicine

## 2012-12-11 NOTE — Telephone Encounter (Signed)
He needs to come in for a visit if he has UTI symptoms, I can not call in antibiotics unless we have a specimen

## 2012-12-12 NOTE — Telephone Encounter (Signed)
Called pt back and gave him above message and he stated he does not have UTI symptoms today, I told him to make appt if he has symptoms fo UTI so that we can properly diagnose him

## 2012-12-13 ENCOUNTER — Telehealth: Payer: Self-pay | Admitting: Family Medicine

## 2012-12-13 NOTE — Telephone Encounter (Signed)
Pt aware and cannot get a ride to come here for a few days. He will call back

## 2012-12-13 NOTE — Telephone Encounter (Signed)
Nothing stronger can be given, he can stop it and try imodium I would recommend that he come in for a visit or schedule with the his gastroenterologist- Dr. Jena Gauss

## 2012-12-21 ENCOUNTER — Encounter (HOSPITAL_COMMUNITY): Payer: Self-pay | Admitting: *Deleted

## 2012-12-21 NOTE — Progress Notes (Signed)
12/21/12 1357  OBSTRUCTIVE SLEEP APNEA  Have you ever been diagnosed with sleep apnea through a sleep study? No  Do you snore loudly (loud enough to be heard through closed doors)?  0  Do you often feel tired, fatigued, or sleepy during the daytime? 0  Has anyone observed you stop breathing during your sleep? 0  Do you have, or are you being treated for high blood pressure? 1  BMI more than 35 kg/m2? 1  Age over 66 years old? 1  Neck circumference greater than 40 cm/18 inches? 1  Gender: 1  Obstructive Sleep Apnea Score 5

## 2012-12-24 MED ORDER — DEXTROSE 5 % IV SOLN
1.5000 g | INTRAVENOUS | Status: AC
  Administered 2012-12-25: 1.5 g via INTRAVENOUS
  Filled 2012-12-24: qty 1.5

## 2012-12-25 ENCOUNTER — Ambulatory Visit (HOSPITAL_COMMUNITY): Payer: PRIVATE HEALTH INSURANCE | Admitting: Anesthesiology

## 2012-12-25 ENCOUNTER — Encounter (HOSPITAL_COMMUNITY)
Admission: RE | Disposition: A | Discharge status: Home / Self Care | Payer: Self-pay | Source: Ambulatory Visit | Attending: Vascular Surgery

## 2012-12-25 ENCOUNTER — Ambulatory Visit (HOSPITAL_COMMUNITY)
Admission: RE | Admit: 2012-12-25 | Discharge: 2012-12-25 | Disposition: A | Discharge status: Home / Self Care | Payer: PRIVATE HEALTH INSURANCE | Source: Ambulatory Visit | Attending: Vascular Surgery | Admitting: Vascular Surgery

## 2012-12-25 ENCOUNTER — Ambulatory Visit (HOSPITAL_COMMUNITY): Payer: PRIVATE HEALTH INSURANCE

## 2012-12-25 ENCOUNTER — Encounter (HOSPITAL_COMMUNITY): Payer: Self-pay | Admitting: Anesthesiology

## 2012-12-25 DIAGNOSIS — M549 Dorsalgia, unspecified: Secondary | ICD-10-CM | POA: Insufficient documentation

## 2012-12-25 DIAGNOSIS — G8929 Other chronic pain: Secondary | ICD-10-CM | POA: Insufficient documentation

## 2012-12-25 DIAGNOSIS — N186 End stage renal disease: Secondary | ICD-10-CM

## 2012-12-25 DIAGNOSIS — Z79899 Other long term (current) drug therapy: Secondary | ICD-10-CM | POA: Insufficient documentation

## 2012-12-25 DIAGNOSIS — S78119A Complete traumatic amputation at level between unspecified hip and knee, initial encounter: Secondary | ICD-10-CM | POA: Insufficient documentation

## 2012-12-25 DIAGNOSIS — E669 Obesity, unspecified: Secondary | ICD-10-CM | POA: Insufficient documentation

## 2012-12-25 DIAGNOSIS — Z6836 Body mass index (BMI) 36.0-36.9, adult: Secondary | ICD-10-CM | POA: Insufficient documentation

## 2012-12-25 DIAGNOSIS — E1129 Type 2 diabetes mellitus with other diabetic kidney complication: Secondary | ICD-10-CM | POA: Insufficient documentation

## 2012-12-25 DIAGNOSIS — I739 Peripheral vascular disease, unspecified: Secondary | ICD-10-CM | POA: Insufficient documentation

## 2012-12-25 DIAGNOSIS — I120 Hypertensive chronic kidney disease with stage 5 chronic kidney disease or end stage renal disease: Secondary | ICD-10-CM | POA: Insufficient documentation

## 2012-12-25 HISTORY — DX: Pain in leg, unspecified: M79.606

## 2012-12-25 HISTORY — PX: AV FISTULA PLACEMENT: SHX1204

## 2012-12-25 LAB — POCT I-STAT 4, (NA,K, GLUC, HGB,HCT)
Glucose, Bld: 110 mg/dL — ABNORMAL HIGH (ref 70–99)
HCT: 37 % — ABNORMAL LOW (ref 39.0–52.0)
Potassium: 4.5 mEq/L (ref 3.5–5.1)

## 2012-12-25 LAB — GLUCOSE, CAPILLARY: Glucose-Capillary: 102 mg/dL — ABNORMAL HIGH (ref 70–99)

## 2012-12-25 SURGERY — INSERTION OF ARTERIOVENOUS (AV) GORE-TEX GRAFT ARM
Anesthesia: General | Site: Arm Upper | Laterality: Right | Wound class: Clean

## 2012-12-25 MED ORDER — OXYCODONE HCL 5 MG/5ML PO SOLN: 5.0000 mg | Freq: Once | ORAL | Status: DC | PRN

## 2012-12-25 MED ORDER — HYDROMORPHONE HCL PF 1 MG/ML IJ SOLN
0.2500 mg | INTRAMUSCULAR | Status: DC | PRN
  Administered 2012-12-25 (×2): 0.5 mg via INTRAVENOUS

## 2012-12-25 MED ORDER — ONDANSETRON HCL 4 MG/2ML IJ SOLN
INTRAMUSCULAR | Status: DC | PRN
  Administered 2012-12-25: 4 mg via INTRAVENOUS

## 2012-12-25 MED ORDER — SODIUM CHLORIDE 0.9 % IR SOLN
Status: DC | PRN
  Administered 2012-12-25: 10:00:00

## 2012-12-25 MED ORDER — HYDROMORPHONE HCL PF 1 MG/ML IJ SOLN
INTRAMUSCULAR | Status: AC
  Filled 2012-12-25: qty 1

## 2012-12-25 MED ORDER — FENTANYL CITRATE 0.05 MG/ML IJ SOLN
INTRAMUSCULAR | Status: DC | PRN
  Administered 2012-12-25: 100 ug via INTRAVENOUS
  Administered 2012-12-25 (×2): 25 ug via INTRAVENOUS

## 2012-12-25 MED ORDER — PROPOFOL 10 MG/ML IV BOLUS
INTRAVENOUS | Status: DC | PRN
  Administered 2012-12-25: 150 mg via INTRAVENOUS

## 2012-12-25 MED ORDER — SODIUM CHLORIDE 0.9 % IV SOLN: INTRAVENOUS | Status: DC

## 2012-12-25 MED ORDER — PROTAMINE SULFATE 10 MG/ML IV SOLN
INTRAVENOUS | Status: DC | PRN
  Administered 2012-12-25: 10 mg via INTRAVENOUS
  Administered 2012-12-25: 20 mg via INTRAVENOUS
  Administered 2012-12-25 (×2): 10 mg via INTRAVENOUS

## 2012-12-25 MED ORDER — OXYCODONE-ACETAMINOPHEN 5-325 MG PO TABS: 1.0000 | ORAL_TABLET | Freq: Four times a day (QID) | ORAL | Status: DC | PRN

## 2012-12-25 MED ORDER — 0.9 % SODIUM CHLORIDE (POUR BTL) OPTIME
TOPICAL | Status: DC | PRN
  Administered 2012-12-25: 1000 mL

## 2012-12-25 MED ORDER — HEPARIN SODIUM (PORCINE) 1000 UNIT/ML IJ SOLN
INTRAMUSCULAR | Status: DC | PRN
  Administered 2012-12-25: 5000 [IU] via INTRAVENOUS

## 2012-12-25 MED ORDER — SODIUM CHLORIDE 0.9 % IV SOLN
INTRAVENOUS | Status: DC | PRN
  Administered 2012-12-25 (×2): via INTRAVENOUS

## 2012-12-25 MED ORDER — THROMBIN 20000 UNITS EX SOLR
CUTANEOUS | Status: AC
  Filled 2012-12-25: qty 20000

## 2012-12-25 MED ORDER — PHENYLEPHRINE HCL 10 MG/ML IJ SOLN
INTRAMUSCULAR | Status: DC | PRN
  Administered 2012-12-25: 120 ug via INTRAVENOUS
  Administered 2012-12-25 (×2): 80 ug via INTRAVENOUS

## 2012-12-25 MED ORDER — OXYCODONE HCL 5 MG PO TABS: 5.0000 mg | ORAL_TABLET | Freq: Once | ORAL | Status: DC | PRN

## 2012-12-25 MED ORDER — LIDOCAINE HCL (PF) 1 % IJ SOLN
INTRAMUSCULAR | Status: AC
  Filled 2012-12-25: qty 30

## 2012-12-25 SURGICAL SUPPLY — 46 items
ARMBAND PINK RESTRICT EXTREMIT (MISCELLANEOUS) ×2 IMPLANT
CANISTER SUCTION 2500CC (MISCELLANEOUS) ×2 IMPLANT
CLIP TI MEDIUM 6 (CLIP) ×2 IMPLANT
CLIP TI WIDE RED SMALL 6 (CLIP) ×2 IMPLANT
CLOTH BEACON ORANGE TIMEOUT ST (SAFETY) ×2 IMPLANT
COVER SURGICAL LIGHT HANDLE (MISCELLANEOUS) ×2 IMPLANT
DECANTER SPIKE VIAL GLASS SM (MISCELLANEOUS) ×2 IMPLANT
DERMABOND ADHESIVE PROPEN (GAUZE/BANDAGES/DRESSINGS) ×1
DERMABOND ADVANCED (GAUZE/BANDAGES/DRESSINGS) ×1
DERMABOND ADVANCED .7 DNX12 (GAUZE/BANDAGES/DRESSINGS) ×1 IMPLANT
DERMABOND ADVANCED .7 DNX6 (GAUZE/BANDAGES/DRESSINGS) ×1 IMPLANT
ELECT REM PT RETURN 9FT ADLT (ELECTROSURGICAL) ×2
ELECTRODE REM PT RTRN 9FT ADLT (ELECTROSURGICAL) ×1 IMPLANT
GEL ULTRASOUND 20GR AQUASONIC (MISCELLANEOUS) IMPLANT
GLOVE BIO SURGEON STRL SZ7.5 (GLOVE) ×2 IMPLANT
GLOVE BIO SURGEON STRL SZ8 (GLOVE) ×2 IMPLANT
GLOVE BIOGEL PI IND STRL 6.5 (GLOVE) ×2 IMPLANT
GLOVE BIOGEL PI IND STRL 7.0 (GLOVE) ×1 IMPLANT
GLOVE BIOGEL PI IND STRL 7.5 (GLOVE) ×1 IMPLANT
GLOVE BIOGEL PI INDICATOR 6.5 (GLOVE) ×2
GLOVE BIOGEL PI INDICATOR 7.0 (GLOVE) ×1
GLOVE BIOGEL PI INDICATOR 7.5 (GLOVE) ×1
GLOVE ECLIPSE 6.0 STRL STRAW (GLOVE) ×2 IMPLANT
GLOVE SS BIOGEL STRL SZ 7 (GLOVE) ×1 IMPLANT
GLOVE SUPERSENSE BIOGEL SZ 7 (GLOVE) ×1
GLOVE SURG SS PI 6.5 STRL IVOR (GLOVE) ×2 IMPLANT
GOWN PREVENTION PLUS XLARGE (GOWN DISPOSABLE) ×2 IMPLANT
GOWN STRL NON-REIN LRG LVL3 (GOWN DISPOSABLE) ×6 IMPLANT
GRAFT GORETEX STRT 4-7X45 (Vascular Products) ×2 IMPLANT
KIT BASIN OR (CUSTOM PROCEDURE TRAY) ×2 IMPLANT
KIT ROOM TURNOVER OR (KITS) ×2 IMPLANT
LOOP VESSEL MAXI BLUE (MISCELLANEOUS) ×2 IMPLANT
LOOP VESSEL MINI RED (MISCELLANEOUS) ×2 IMPLANT
NEEDLE HYPO 25GX1X1/2 BEV (NEEDLE) ×2 IMPLANT
NS IRRIG 1000ML POUR BTL (IV SOLUTION) ×2 IMPLANT
PACK CV ACCESS (CUSTOM PROCEDURE TRAY) ×2 IMPLANT
PAD ARMBOARD 7.5X6 YLW CONV (MISCELLANEOUS) ×4 IMPLANT
SPONGE SURGIFOAM ABS GEL 100 (HEMOSTASIS) IMPLANT
SUT PROLENE 6 0 CC (SUTURE) ×4 IMPLANT
SUT VIC AB 3-0 SH 27 (SUTURE) ×2
SUT VIC AB 3-0 SH 27X BRD (SUTURE) ×2 IMPLANT
SUT VICRYL 4-0 PS2 18IN ABS (SUTURE) ×4 IMPLANT
TOWEL OR 17X24 6PK STRL BLUE (TOWEL DISPOSABLE) ×2 IMPLANT
TOWEL OR 17X26 10 PK STRL BLUE (TOWEL DISPOSABLE) ×2 IMPLANT
UNDERPAD 30X30 INCONTINENT (UNDERPADS AND DIAPERS) ×2 IMPLANT
WATER STERILE IRR 1000ML POUR (IV SOLUTION) ×2 IMPLANT

## 2012-12-25 NOTE — H&P (View-Only) (Signed)
[facility] [place] HISTORY AND PHYSICAL   History of Present Illness:  Patient is a 67 y.o. year old male who presents for placement of a permanent hemodialysis access. The patient is right handed .  The patient is currently on hemodialysis.  The cause of renal failure is thought to be secondary to multiple factors hypertension diabetes.  Other chronic medical problems include hypertension, diabetes end-stage renal disease Tuesday Thursday Saturday dialysis. He has previously had a left basilic vein transposition fistula, right brachiocephalic fistula, left upper arm AV graft.  Past Medical History  Diagnosis Date  . Hypertension   . Diabetes mellitus   . Peripheral vascular disease   . Obesity   . Chronic back pain   . End stage renal disease     on Hemodialysis  . ESRD on hemodialysis   . Chronic diarrhea     Past Surgical History  Procedure Laterality Date  . Above knee leg amputation      Right AKA  . Arteriovenous graft placement    . Pr vein bypass graft,aorto-fem-pop    . Insertion of dialysis catheter  07/30/2011    Procedure: INSERTION OF DIALYSIS CATHETER;  Surgeon: Todd F Early, MD;  Location: [facility] OR;  Service: Vascular;  Laterality: Right;  removal of right dialysis catheter and exchange/ insertion of new right dialysis catheter  . Colonoscopy  Dec 2007    RMR: normal rectum, colonic polyps with ascending colon polyp actively oozing, s/p hot snare polypectomy: tubulovillous adenoma, adenomatous polyps   . Av fistula placement    . Bascilic vein transposition  08/24/2011    Left upper arm  . Eye surgery      'not sure what kind'  . Av fistula placement  01/04/2012    Procedure: INSERTION OF ARTERIOVENOUS (AV) GORE-TEX GRAFT ARM;  Surgeon: Brian L Chen, MD;  Location: [facility] OR;  Service: Vascular;  Laterality: Left;  4 x 7mm stretch graft implanted in left upper arm  . Colonoscopy N/A 07/13/2012    Procedure: COLONOSCOPY;  Surgeon: Robert M Rourk, MD;   Location: [facility];  Service: Endoscopy;  Laterality: N/A;  10:00     Social History History  Substance Use Topics  . Smoking status: Never Smoker   . Smokeless tobacco: Never Used  . Alcohol Use: No    Family History Family History  Problem Relation Age of Onset  . Hypertension Mother   . Diabetes Father   . Anesthesia problems Neg Hx   . Colon cancer Neg Hx     Allergies  No Known Allergies   Current Outpatient Prescriptions  Medication Sig Dispense Refill  . cinacalcet (SENSIPAR) 60 MG tablet Take 120 mg by mouth daily. Takes 2 tablets at lunch      . diltiazem (CARDIZEM CD) 180 MG 24 hr capsule Take 180 mg by mouth daily.       . diphenoxylate-atropine (LOMOTIL) 2.5-0.025 MG per tablet Take 1 tablet by mouth 3 (three) times daily as needed. For Diarrhea      . hydrALAZINE (APRESOLINE) 50 MG tablet Take 50 mg by mouth 3 (three) times daily.       . ibuprofen (ADVIL,MOTRIN) 400 MG tablet Take 1 tablet (400 mg total) by mouth every 6 (six) hours as needed for pain.  30 tablet  0  . loratadine (CLARITIN) 10 MG tablet Take 10 mg by mouth daily.      . methocarbamol (ROBAXIN) 500 MG tablet Take 1 tablet (500   mg total) by mouth 2 (two) times daily.  20 tablet  0  . multivitamin (RENA-VIT) TABS tablet Take 1 tablet by mouth daily.      . oxyCODONE-acetaminophen (ROXICET) 5-325 MG per tablet Take 1 tablet by mouth 2 (two) times daily as needed for pain.  60 tablet  0  . sevelamer (RENAGEL) 800 MG tablet Take 2,400-4,000 mg by mouth 2 (two) times daily with a meal. Take 5 tabs (4000mg) 2 times daily with meals and 3 tabs (2400mg) as needed with snacks.       No current facility-administered medications for this visit.    ROS:   General:  No weight loss, Fever, chills  HEENT: No recent headaches, no nasal bleeding, no visual changes, no sore throat  Neurologic: No dizziness, blackouts, seizures. No recent symptoms of stroke or mini- stroke. No recent episodes of slurred  speech, or temporary blindness.  Cardiac: No recent episodes of chest pain/pressure, no shortness of breath at rest.  No shortness of breath with exertion.  Denies history of atrial fibrillation or irregular heartbeat  Vascular: No history of rest pain in feet.  No history of claudication.  No history of non-healing ulcer, No history of DVT   Pulmonary: No home oxygen, no productive cough, no hemoptysis,  No asthma or wheezing  Musculoskeletal:  [ ] Arthritis, [ ] Low back pain,  [ ] Joint pain  Hematologic:No history of hypercoagulable state.  No history of easy bleeding.  No history of anemia  Gastrointestinal: No hematochezia or melena,  No gastroesophageal reflux, no trouble swallowing  Urinary: [ ] chronic Kidney disease, [ ] on HD - [ ] MWF or [ ] TTHS, [ ] Burning with urination, [ ] Frequent urination, [ ] Difficulty urinating;   Skin: No rashes  Psychological: No history of anxiety,  No history of depression   Physical Examination  Filed Vitals:   12/07/12 1204  BP: 132/90  Pulse: 70  Resp: 16  Height: 5' 10" (1.778 m)  Weight: 255 lb (115.667 kg)  SpO2: 100%    Body mass index is 36.59 kg/(m^2).  General:  Alert and oriented, no acute distress HEENT: Normal Neck: No bruit or JVD Pulmonary: Clear to auscultation bilaterally Cardiac: Regular Rate and Rhythm without murmur Gastrointestinal: Soft, non-tender, non-distended, no mass, no scars Skin: No rash Extremity Pulses:  1+ radial, brachial pulses bilaterally, right above-knee amputation Musculoskeletal: No deformity or edema  Neurologic: Upper and lower extremity motor 5/5 and symmetric  DATA: Vein mapping ultrasound today shows no suitable cephalic vein, basilic vein is marginal size less than 3 mm   ASSESSMENT:   Needs hemodialysis access   PLAN:  Discussed with patient the possibility of a staged basilic vein transposition fistula but also described the vein was fairly small and this may not mature. At  this point he has opted for a right upper arm AV graft. Is scheduled for August 18. Risks benefits possible complications and procedure details were discussed with the patient today including but not limited to bleeding infection graft thrombosis ischemic steal he understands and agrees to proceed.  Charles Fields, MD [facility] [place] Office: [phone] Pager: [phone]  

## 2012-12-25 NOTE — Interval H&P Note (Signed)
History and Physical Interval Note:  12/25/2012 7:43 AM  Julian [name]  has presented today for surgery, with the diagnosis of ESRD  The various methods of treatment have been discussed with the patient and family. After consideration of risks, benefits and other options for treatment, the patient has consented to  Procedure(s): INSERTION OF ARTERIOVENOUS (AV) GORE-TEX GRAFT ARM (Right) as a surgical intervention .  The patient's history has been reviewed, patient examined, no change in status, stable for surgery.  I have reviewed the patient's chart and labs.  Questions were answered to the patient's satisfaction.     Stephanine Reas E

## 2012-12-25 NOTE — Anesthesia Preprocedure Evaluation (Addendum)
Anesthesia Evaluation  Patient identified by MRN, date of birth, ID band Patient awake    Reviewed: Allergy & Precautions, H&P , NPO status , Patient's Chart, lab work & pertinent test results  Airway Mallampati: II TM Distance: >3 FB Neck ROM: Full    Dental no notable dental hx. (+) Poor Dentition and Dental Advisory Given   Pulmonary neg pulmonary ROS,  breath sounds clear to auscultation  Pulmonary exam normal       Cardiovascular hypertension, On Medications + Peripheral Vascular Disease Rhythm:Regular Rate:Normal     Neuro/Psych negative neurological ROS  negative psych ROS   GI/Hepatic negative GI ROS, Neg liver ROS,   Endo/Other  diabetes  Renal/GU ESRF and DialysisRenal disease  negative genitourinary   Musculoskeletal   Abdominal   Peds  Hematology negative hematology ROS (+)   Anesthesia Other Findings   Reproductive/Obstetrics negative OB ROS                          Anesthesia Physical Anesthesia Plan  ASA: III  Anesthesia Plan: General   Post-op Pain Management:    Induction: Intravenous  Airway Management Planned: LMA  Additional Equipment:   Intra-op Plan:   Post-operative Plan: Extubation in OR  Informed Consent: I have reviewed the patients History and Physical, chart, labs and discussed the procedure including the risks, benefits and alternatives for the proposed anesthesia with the patient or authorized representative who has indicated his/her understanding and acceptance.   Dental advisory given  Plan Discussed with: CRNA  Anesthesia Plan Comments:         Anesthesia Quick Evaluation

## 2012-12-25 NOTE — Anesthesia Postprocedure Evaluation (Signed)
  Anesthesia Post-op Note  Patient: Julian [name]  Procedure(s) Performed: Procedure(s): INSERTION OF ARTERIOVENOUS (AV) GORE-TEX GRAFT ARM (Right)  Patient Location: PACU  Anesthesia Type:General  Level of Consciousness: awake, alert  and oriented  Airway and Oxygen Therapy: Patient Spontanous Breathing  Post-op Pain: mild  Post-op Assessment: Post-op Vital signs reviewed, Patient's Cardiovascular Status Stable, Respiratory Function Stable, Patent Airway and No signs of Nausea or vomiting  Post-op Vital Signs: Reviewed and stable  Complications: No apparent anesthesia complications

## 2012-12-25 NOTE — Anesthesia Procedure Notes (Signed)
Procedure Name: LMA Insertion Date/Time: 12/25/2012 9:11 AM Performed by: Carmela Rima Pre-anesthesia Checklist: Timeout performed, Emergency Drugs available, Patient identified, Suction available and Patient being monitored Patient Re-evaluated:Patient Re-evaluated prior to inductionOxygen Delivery Method: Circle system utilized Preoxygenation: Pre-oxygenation with 100% oxygen Intubation Type: IV induction Ventilation: Mask ventilation without difficulty LMA: LMA inserted LMA Size: 5.0 Number of attempts: 1 Placement Confirmation: positive ETCO2 and breath sounds checked- equal and bilateral Tube secured with: Tape Dental Injury: Teeth and Oropharynx as per pre-operative assessment

## 2012-12-25 NOTE — Preoperative (Signed)
Beta Blockers   Reason not to administer Beta Blockers:Not Applicable 

## 2012-12-25 NOTE — Op Note (Signed)
Procedure: Right Upper Arm AV graft  Preop: ESRD  Postop: ESRD  Anesthesia: General  Asst: Lianne Cure, PA-C  Findings:4-7 mm PTFE end to side to axillary vein   Procedure Details: The right upper extremity was prepped and draped in usual sterile fashion.  A longitudinal incision was then made near the antecubital crease the right arm.  There were no suitable antecubital veins for outflow.   The incision was carried into the subcutaneous tissues down to level of the brachial artery.  There was a pre existing occlueded av fistula which was dissected free circumferentially.  Next the brachial artery was dissected circumferentially. The artery was  3-4 mm in diameter and calcified in islands. The vessel loops were placed proximal and distal to the planned site of arteriotomy. At this point, a longitudinal incision was made in the axilla and carried through the subcutaneous tissues and fascia to expose the axillary vein.  The nerves were protected.  The vein was approximately 4-5 mm in diameter. Next, a subcutaneous tunnel was created connecting the upper arm to the lower arm incision in an arcing configuration over the biceps muscle.  A 4-7 mm PTFE graft was then brought through this subcutaneous tunnel. The patient was given 5000 units of intravenous heparin. After appropriate circulation time, the vessel loops were used to control the artery. A longitudinal opening was made in the right brachial artery.  The 4 mm end of the graft was beveled and sewn end to side to the artery using a 6 0 prolene.  At completion of the anastomosis the artery was forward bled, backbled and thoroughly flushed.  The anastomosis was secured, vessel loops were released and there was palpable pulse in the graft.  The graft was clamped just above the arterial anastomosis with a fistula clamp. The graft was then pulled taut to length at the axillary incision.  The axillary vein was controlled with a fine bulldog clamps in  the upper axilla.  Two large side branches were controlled with vessel loops.  The vein was opened longitudinally with an 11 blade.  The distal end of the graft was then beveled and sewn end to side to the vein using a running 6 0 prolene.  Just prior to completion of the anastomosis, everything was forward bled, back bled and thoroughly flushed.  The anastomosis was secured and the fistula clamp removed from the proximal graft.  A thrill was immediately palpable in the graft. The patient was given 50 mg of protamine to assist with hemostasis.  After hemostasis was obtained, the subcutaneous tissues were reapproximated using a running 3-0 Vicryl suture. The skin was then closed with a 4 0 Vicryl subcuticular stitch. Dermabond was applied to the skin incisions.  The patient tolerated the procedure well and there were no complications.  Instrument sponge and needle count was correct at the end of the case.  The patient was taken to the recovery room in stable condition. The patient had audible radial and ulnar doppler signals at the end of the case.  Fabienne Bruns, MD [facility] [place] Office: [phone] Pager: [phone]

## 2012-12-25 NOTE — Transfer of Care (Signed)
Immediate Anesthesia Transfer of Care Note  Patient: Julian [name]  Procedure(s) Performed: Procedure(s): INSERTION OF ARTERIOVENOUS (AV) GORE-TEX GRAFT ARM (Right)  Patient Location: PACU  Anesthesia Type:General  Level of Consciousness: awake, alert  and oriented  Airway & Oxygen Therapy: Patient Spontanous Breathing and Patient connected to face mask oxygen  Post-op Assessment: Report given to PACU RN, Post -op Vital signs reviewed and stable and Patient moving all extremities X 4  Post vital signs: Reviewed and stable  Complications: No apparent anesthesia complications

## 2012-12-26 ENCOUNTER — Encounter (HOSPITAL_COMMUNITY): Payer: Self-pay | Admitting: Vascular Surgery

## 2013-01-01 ENCOUNTER — Telehealth: Payer: Self-pay | Admitting: Family Medicine

## 2013-01-01 NOTE — Telephone Encounter (Signed)
Okay to refill, he must pick up

## 2013-01-01 NOTE — Telephone Encounter (Signed)
?   OK to Refill  

## 2013-01-02 ENCOUNTER — Telehealth: Payer: Self-pay | Admitting: Family Medicine

## 2013-01-02 ENCOUNTER — Encounter: Payer: Self-pay | Admitting: Internal Medicine

## 2013-01-02 MED ORDER — OXYCODONE-ACETAMINOPHEN 5-325 MG PO TABS: 1.0000 | ORAL_TABLET | Freq: Four times a day (QID) | ORAL | Status: DC | PRN

## 2013-01-02 NOTE — Telephone Encounter (Signed)
.  RX printed, left up front and tried to call pt - no answer - no vm

## 2013-01-02 NOTE — Telephone Encounter (Signed)
Julian [name] called to say that we only gave pt #40 of percocet instead of his normal #60. Please call her at [phone].

## 2013-01-02 NOTE — Telephone Encounter (Signed)
I called and LVM for pt Julian [name], to call me back I did speak with Julian [name], I advised him we did make an error and he is to get 60 tablets He has appt with me Friday, will give remainder script then He has some swelling from is Graft placement he wants me to check Also, I advised him due to his Aides behavior and attitude toward staff she is not able to come in to pick up his meds any longer. He voiced unerstanding

## 2013-01-03 ENCOUNTER — Ambulatory Visit (INDEPENDENT_AMBULATORY_CARE_PROVIDER_SITE_OTHER): Payer: PRIVATE HEALTH INSURANCE | Admitting: Gastroenterology

## 2013-01-03 ENCOUNTER — Encounter: Payer: Self-pay | Admitting: Gastroenterology

## 2013-01-03 VITALS — BP 123/63 | HR 77 | Temp 97.9°F | Ht 70.0 in

## 2013-01-03 DIAGNOSIS — A0472 Enterocolitis due to Clostridium difficile, not specified as recurrent: Secondary | ICD-10-CM

## 2013-01-03 DIAGNOSIS — D369 Benign neoplasm, unspecified site: Secondary | ICD-10-CM

## 2013-01-03 MED ORDER — METRONIDAZOLE 500 MG PO TABS: 500.0000 mg | ORAL_TABLET | Freq: Three times a day (TID) | ORAL | Status: DC

## 2013-01-03 MED ORDER — DICYCLOMINE HCL 10 MG PO CAPS: 10.0000 mg | ORAL_CAPSULE | Freq: Three times a day (TID) | ORAL | Status: DC

## 2013-01-03 NOTE — Patient Instructions (Addendum)
Start taking Bentyl with meals and at bedtime, no more than 4 times per day. This is for help with loose stool after eating.  I have sent in another round of antibiotics for possible bacterial infection. Take this three times a day for 2 weeks.  We will see you again in 4 weeks.
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
## 2013-01-05 ENCOUNTER — Encounter: Payer: Self-pay | Admitting: Gastroenterology

## 2013-01-05 ENCOUNTER — Encounter: Payer: Self-pay | Admitting: Family Medicine

## 2013-01-05 ENCOUNTER — Ambulatory Visit (INDEPENDENT_AMBULATORY_CARE_PROVIDER_SITE_OTHER): Payer: PRIVATE HEALTH INSURANCE | Admitting: Family Medicine

## 2013-01-05 VITALS — BP 122/64 | HR 68 | Temp 97.5°F | Resp 18

## 2013-01-05 DIAGNOSIS — M549 Dorsalgia, unspecified: Secondary | ICD-10-CM

## 2013-01-05 DIAGNOSIS — L03113 Cellulitis of right upper limb: Secondary | ICD-10-CM

## 2013-01-05 DIAGNOSIS — A0472 Enterocolitis due to Clostridium difficile, not specified as recurrent: Secondary | ICD-10-CM | POA: Insufficient documentation

## 2013-01-05 DIAGNOSIS — E119 Type 2 diabetes mellitus without complications: Secondary | ICD-10-CM

## 2013-01-05 DIAGNOSIS — IMO0002 Reserved for concepts with insufficient information to code with codable children: Secondary | ICD-10-CM

## 2013-01-05 LAB — CBC WITH DIFFERENTIAL/PLATELET
Basophils Absolute: 0 10*3/uL (ref 0.0–0.1)
Basophils Relative: 0 % (ref 0–1)
Eosinophils Absolute: 0.2 10*3/uL (ref 0.0–0.7)
Hemoglobin: 9.7 g/dL — ABNORMAL LOW (ref 13.0–17.0)
MCH: 29.8 pg (ref 26.0–34.0)
MCHC: 31.9 g/dL (ref 30.0–36.0)
Monocytes Relative: 9 % (ref 3–12)
Neutro Abs: 3.9 10*3/uL (ref 1.7–7.7)
Neutrophils Relative %: 64 % (ref 43–77)
Platelets: 147 10*3/uL — ABNORMAL LOW (ref 150–400)

## 2013-01-05 MED ORDER — OXYCODONE-ACETAMINOPHEN 5-325 MG PO TABS: 1.0000 | ORAL_TABLET | Freq: Two times a day (BID) | ORAL | Status: DC | PRN

## 2013-01-05 MED ORDER — CEPHALEXIN 500 MG PO CAPS: 500.0000 mg | ORAL_CAPSULE | Freq: Two times a day (BID) | ORAL | Status: DC

## 2013-01-05 NOTE — Patient Instructions (Addendum)
Take antibiotics as prescribed -- Keflex this is for arm infection Take pain medication Continue to elevate arm with 2-3 pillows You need to see Dr. Darrick Penna in his office in [place] on Sept 4 at 10am Change F/U to 2 months

## 2013-01-05 NOTE — Progress Notes (Signed)
  Subjective:    Patient ID: Julian [name], male    DOB: 10-06-1945, 67 y.o.   MRN: [id number]  HPI  Pt here with right arm swelling since he had AV graft placed on 8/18. He denies any fever, but complains of sweating at bedtime for past 3 months or so. No drainage from site, but swelling has not improved even with use of ICE. + pain in arm, worse when he has HD CBG have been running around 100 or less fasting, with no insulin.   Review of Systems - per above  GEN- denies fatigue, fever, weight loss,weakness, recent illness, +sweats HEENT- denies eye drainage, change in vision, nasal discharge, CVS- denies chest pain, palpitations RESP- denies SOB, cough, wheeze MSK- +joint pain, muscle aches, injury Neuro- denies headache, dizziness, syncope, seizure activity      Objective:   Physical Exam GEN- NAD, alert and oriented x3 HEENT- PERRL, EOMI, non injected sclera, pink conjunctiva, MMM, oropharynx clear Neck- Supple, no LAD CVS- RRR, no murmur RESP-CTAB\ Chest Wall- catheter in right chest wall EXT- RUE 1+ non pitting edema from arm to hand , no drainage from AV graft, +erythema around grafe and extending to forearm, mild induration, +warmth Pulses- Radial- difficult to palpate due to swelling, hand warm to touch, color wnl         Assessment & Plan:

## 2013-01-05 NOTE — Assessment & Plan Note (Signed)
Prior history of Cdiff March 2014: Flagyl. Now with persistent diarrhea, refusing stool sample. High suspicion for recurrence. Additional round of Flagyl X 14 days empirically. Due to renal disease and dialysis, may need to consider Dificid instead of Vanc if no improvement in diarrhea. Add Bentyl for supportive measures. Short-term follow-up in the next few weeks.   As of note, right upper extremity with significant edema s/p AV graft. Discussed with Joyce Gross who works with [facility]. No signs/symptoms of infection or wound drainage. Patient does admit he is not elevating as he should. Per Joyce Gross, this is an expected finding in this scenario but patient is aware to call with any changes.

## 2013-01-05 NOTE — Assessment & Plan Note (Signed)
Surveillance due again in March 2019.

## 2013-01-06 LAB — BASIC METABOLIC PANEL
Calcium: 9.6 mg/dL (ref 8.4–10.5)
Glucose, Bld: 111 mg/dL — ABNORMAL HIGH (ref 70–99)
Potassium: 4.5 mEq/L (ref 3.5–5.3)
Sodium: 139 mEq/L (ref 135–145)

## 2013-01-07 ENCOUNTER — Emergency Department (HOSPITAL_COMMUNITY)
Admission: EM | Admit: 2013-01-07 | Discharge: 2013-01-07 | Disposition: A | Discharge status: Home / Self Care | Payer: PRIVATE HEALTH INSURANCE | Attending: Emergency Medicine | Admitting: Emergency Medicine

## 2013-01-07 ENCOUNTER — Encounter (HOSPITAL_COMMUNITY): Payer: Self-pay | Admitting: Emergency Medicine

## 2013-01-07 DIAGNOSIS — E119 Type 2 diabetes mellitus without complications: Secondary | ICD-10-CM | POA: Insufficient documentation

## 2013-01-07 DIAGNOSIS — Z8679 Personal history of other diseases of the circulatory system: Secondary | ICD-10-CM | POA: Insufficient documentation

## 2013-01-07 DIAGNOSIS — Z8701 Personal history of pneumonia (recurrent): Secondary | ICD-10-CM | POA: Insufficient documentation

## 2013-01-07 DIAGNOSIS — I120 Hypertensive chronic kidney disease with stage 5 chronic kidney disease or end stage renal disease: Secondary | ICD-10-CM | POA: Insufficient documentation

## 2013-01-07 DIAGNOSIS — N186 End stage renal disease: Secondary | ICD-10-CM | POA: Insufficient documentation

## 2013-01-07 DIAGNOSIS — R61 Generalized hyperhidrosis: Secondary | ICD-10-CM | POA: Insufficient documentation

## 2013-01-07 DIAGNOSIS — M79609 Pain in unspecified limb: Secondary | ICD-10-CM | POA: Insufficient documentation

## 2013-01-07 DIAGNOSIS — G8929 Other chronic pain: Secondary | ICD-10-CM | POA: Insufficient documentation

## 2013-01-07 DIAGNOSIS — R209 Unspecified disturbances of skin sensation: Secondary | ICD-10-CM | POA: Insufficient documentation

## 2013-01-07 DIAGNOSIS — Z8619 Personal history of other infectious and parasitic diseases: Secondary | ICD-10-CM | POA: Insufficient documentation

## 2013-01-07 DIAGNOSIS — M7989 Other specified soft tissue disorders: Secondary | ICD-10-CM | POA: Insufficient documentation

## 2013-01-07 DIAGNOSIS — Z79899 Other long term (current) drug therapy: Secondary | ICD-10-CM | POA: Insufficient documentation

## 2013-01-07 DIAGNOSIS — Z992 Dependence on renal dialysis: Secondary | ICD-10-CM | POA: Insufficient documentation

## 2013-01-07 DIAGNOSIS — E669 Obesity, unspecified: Secondary | ICD-10-CM | POA: Insufficient documentation

## 2013-01-07 LAB — BASIC METABOLIC PANEL
CO2: 25 mEq/L (ref 19–32)
Chloride: 98 mEq/L (ref 96–112)
Creatinine, Ser: 8.15 mg/dL — ABNORMAL HIGH (ref 0.50–1.35)
Glucose, Bld: 129 mg/dL — ABNORMAL HIGH (ref 70–99)

## 2013-01-07 LAB — CBC WITH DIFFERENTIAL/PLATELET
Basophils Absolute: 0 10*3/uL (ref 0.0–0.1)
Eosinophils Relative: 7 % — ABNORMAL HIGH (ref 0–5)
HCT: 34.5 % — ABNORMAL LOW (ref 39.0–52.0)
Hemoglobin: 10.9 g/dL — ABNORMAL LOW (ref 13.0–17.0)
Lymphocytes Relative: 14 % (ref 12–46)
Lymphs Abs: 0.6 10*3/uL — ABNORMAL LOW (ref 0.7–4.0)
MCV: 98.9 fL (ref 78.0–100.0)
Monocytes Absolute: 0.3 10*3/uL (ref 0.1–1.0)
Neutro Abs: 3 10*3/uL (ref 1.7–7.7)
RBC: 3.49 MIL/uL — ABNORMAL LOW (ref 4.22–5.81)
RDW: 14.4 % (ref 11.5–15.5)
WBC: 4.2 10*3/uL (ref 4.0–10.5)
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
## 2013-01-07 NOTE — ED Notes (Signed)
Patient is a dialysis patient and his right arm began swelling on 12/25/12.  Patient received a new graft in right chest and the swelling has continued.  Patient states was seen by his doctor on Friday and started an antibiotic.  Patient presents tonight via [facility] with c/o continued swelling.

## 2013-01-08 ENCOUNTER — Encounter: Payer: Self-pay | Admitting: Family Medicine

## 2013-01-08 DIAGNOSIS — L03113 Cellulitis of right upper limb: Secondary | ICD-10-CM | POA: Insufficient documentation

## 2013-01-08 NOTE — Assessment & Plan Note (Signed)
Discussed with Dr. [name] on call for [facility] Will start keflex, elevate arm I have appt scheduled for him for Thursday with Dr. Darrick Penna who did procedure His radial pulse is difficulty to appreciate but he is getting some flow to the extremities

## 2013-01-08 NOTE — Assessment & Plan Note (Signed)
See last phone note, he was given scrip for 20 percocet today, she should have received 60 total for the month, error was on our part

## 2013-01-08 NOTE — Assessment & Plan Note (Signed)
CBG look good, despite current infection Will hold on any new medications

## 2013-01-09 ENCOUNTER — Telehealth: Payer: Self-pay

## 2013-01-09 ENCOUNTER — Telehealth: Payer: Self-pay | Admitting: Family Medicine

## 2013-01-09 NOTE — Telephone Encounter (Signed)
Pt called stating he needed transportation to ED and that we needed to call [facility]. I asked if he had called EMS and he said he did not and will not call them. I asked why he needed [facility] to ED and he stated that he is tired of his arm swelling and infected and wanted the tube pulled out of him. HE is aware that he has appt with vascular and vein in [place] and said if he had to he will wait until then, but was afraid the infection was going to go into his blood stream and KILL him. I told him if he was that worried about that then he needed to go to ED and the call the EMS.

## 2013-01-09 NOTE — Telephone Encounter (Signed)
Pt is aware of message

## 2013-01-09 NOTE — Telephone Encounter (Signed)
Pt does not understand why you want him to go back to [facility]. He said that you were going to get him to go back to the hospital to get the "thing" out of his arm. Can you please call him and explain what you want him to do?

## 2013-01-09 NOTE — Telephone Encounter (Signed)
Nurse from dialysis center called to report pt. having increased swelling in right shoulder area, and into neck.  Reports pt. was in the ER on Sunday due to swelling of right upper arm AVG site.  Denies that pt. having any difficulty with swallowing or breathing.   States pt. on Keflex that was started by PCP on 8/29, after talking to Dr. [name] about pt's symptoms.  Called pt. @ home to discuss symptoms.  Reports he has swelling in the right side of neck, and has difficulty turning head toward the right side.  Denies any shortness of breath.  Denies fever.  States he sweats at night.  Discussed with Dr. Hart Rochester; pt. currently has f/u appt. with Dr. Darrick Penna on 9/4.  Per Dr. Hart Rochester: okay to wait until Thursday, 9/4, to be seen if pt. not having fever/chills/ or any sign of infection @ site.  Notified nurse, Tonya at the dialysis unit, and she will arrange transportation for pt. to come to office 01/11/13 @ 10:00 AM.

## 2013-01-09 NOTE — Progress Notes (Signed)
CC'd to PCP 

## 2013-01-09 NOTE — Telephone Encounter (Signed)
He needs to see the vascular surgeon that I scheduled him with Dr. Darrick Penna I can not take out the graft, and right now they do not want to remove it He should take the antibiotics and go to Appt on Thursday I am aware he was in ED over the weekend, they have him the same instructions  He should only go to ER if he has fever, worse swelling or pain, otherwise there is nothing the ER can do for him

## 2013-01-10 ENCOUNTER — Encounter: Payer: Self-pay | Admitting: Vascular Surgery

## 2013-01-11 ENCOUNTER — Ambulatory Visit (INDEPENDENT_AMBULATORY_CARE_PROVIDER_SITE_OTHER): Payer: Self-pay | Admitting: Vascular Surgery

## 2013-01-11 ENCOUNTER — Encounter: Payer: Self-pay | Admitting: Vascular Surgery

## 2013-01-11 VITALS — BP 150/60 | HR 72 | Temp 97.7°F | Ht 70.0 in | Wt 255.0 lb

## 2013-01-11 DIAGNOSIS — T82898A Other specified complication of vascular prosthetic devices, implants and grafts, initial encounter: Secondary | ICD-10-CM

## 2013-01-11 DIAGNOSIS — N186 End stage renal disease: Secondary | ICD-10-CM

## 2013-01-11 NOTE — Progress Notes (Signed)
Patient is a 67 year old male who recently had a right upper arm graft placed on August 18. He has had diffuse right arm swelling since that time. He does have a right-sided catheter. He has not had any incisional drainage. He has some occasional numbness in the right hand but this resolves fairly quickly.  Physical exam: Filed Vitals:   01/11/13 1032  BP: 150/60  Pulse: 72  Temp: 97.7 F (36.5 C)  TempSrc: Oral  Height: 5\' 10"  (1.778 m)  Weight: 255 lb (115.667 kg)  SpO2: 96%    Right upper extremity: Audible bruit palpable thrill in graft diffuse edema right arm from shoulder to hand  Assessment: Right upper extremity edema post right upper arm graft which could be secondary to venous hypertension. Plan: Will give graft two more weeks to incorporate and then plan on right arm shuntogram with central venogram.  Fabienne Bruns, MD [facility] [place] Office: [phone] Pager: [phone]

## 2013-01-12 ENCOUNTER — Other Ambulatory Visit: Payer: Self-pay

## 2013-01-18 ENCOUNTER — Encounter (HOSPITAL_COMMUNITY): Payer: Self-pay | Admitting: Respiratory Therapy

## 2013-01-26 ENCOUNTER — Encounter (HOSPITAL_COMMUNITY)
Admission: RE | Disposition: A | Discharge status: Home / Self Care | Payer: Self-pay | Source: Ambulatory Visit | Attending: Vascular Surgery

## 2013-01-26 ENCOUNTER — Ambulatory Visit (HOSPITAL_COMMUNITY)
Admission: RE | Admit: 2013-01-26 | Discharge: 2013-01-26 | Disposition: A | Discharge status: Home / Self Care | Payer: PRIVATE HEALTH INSURANCE | Source: Ambulatory Visit | Attending: Vascular Surgery | Admitting: Vascular Surgery

## 2013-01-26 DIAGNOSIS — T82898A Other specified complication of vascular prosthetic devices, implants and grafts, initial encounter: Secondary | ICD-10-CM

## 2013-01-26 DIAGNOSIS — Z992 Dependence on renal dialysis: Secondary | ICD-10-CM | POA: Insufficient documentation

## 2013-01-26 DIAGNOSIS — Y832 Surgical operation with anastomosis, bypass or graft as the cause of abnormal reaction of the patient, or of later complication, without mention of misadventure at the time of the procedure: Secondary | ICD-10-CM | POA: Insufficient documentation

## 2013-01-26 DIAGNOSIS — N186 End stage renal disease: Secondary | ICD-10-CM | POA: Insufficient documentation

## 2013-01-26 DIAGNOSIS — I82B19 Acute embolism and thrombosis of unspecified subclavian vein: Secondary | ICD-10-CM | POA: Insufficient documentation

## 2013-01-26 DIAGNOSIS — I871 Compression of vein: Secondary | ICD-10-CM | POA: Insufficient documentation

## 2013-01-26 HISTORY — PX: SHUNTOGRAM: SHX5491

## 2013-01-26 LAB — POCT I-STAT, CHEM 8
BUN: 37 mg/dL — ABNORMAL HIGH (ref 6–23)
Calcium, Ion: 1.32 mmol/L — ABNORMAL HIGH (ref 1.13–1.30)
Chloride: 104 meq/L (ref 96–112)
Creatinine, Ser: 7.8 mg/dL — ABNORMAL HIGH (ref 0.50–1.35)
Glucose, Bld: 116 mg/dL — ABNORMAL HIGH (ref 70–99)
HCT: 33 % — ABNORMAL LOW (ref 39.0–52.0)
Hemoglobin: 11.2 g/dL — ABNORMAL LOW (ref 13.0–17.0)
Potassium: 4.3 meq/L (ref 3.5–5.1)
Sodium: 138 meq/L (ref 135–145)
TCO2: 23 mmol/L (ref 0–100)

## 2013-01-26 SURGERY — ASSESSMENT, SHUNT FUNCTION, WITH CONTRAST RADIOGRAPHIC STUDY
Anesthesia: LOCAL | Laterality: Right

## 2013-01-26 MED ORDER — LIDOCAINE HCL (PF) 1 % IJ SOLN
INTRAMUSCULAR | Status: AC
  Filled 2013-01-26: qty 30

## 2013-01-26 MED ORDER — SODIUM CHLORIDE 0.9 % IJ SOLN: 3.0000 mL | INTRAMUSCULAR | Status: DC | PRN

## 2013-01-26 MED ORDER — HEPARIN (PORCINE) IN NACL 2-0.9 UNIT/ML-% IJ SOLN
INTRAMUSCULAR | Status: AC
  Filled 2013-01-26: qty 500

## 2013-01-26 NOTE — H&P (View-Only) (Signed)
Patient is a 67-year-old male who recently had a right upper arm graft placed on August 18. He has had diffuse right arm swelling since that time. He does have a right-sided catheter. He has not had any incisional drainage. He has some occasional numbness in the right hand but this resolves fairly quickly.  Physical exam: Filed Vitals:   01/11/13 1032  BP: 150/60  Pulse: 72  Temp: 97.7 F (36.5 C)  TempSrc: Oral  Height: 5' 10" (1.778 m)  Weight: 255 lb (115.667 kg)  SpO2: 96%    Right upper extremity: Audible bruit palpable thrill in graft diffuse edema right arm from shoulder to hand  Assessment: Right upper extremity edema post right upper arm graft which could be secondary to venous hypertension. Plan: Will give graft two more weeks to incorporate and then plan on right arm shuntogram with central venogram.  Charles Fields, MD [facility] [place] Office: [phone] Pager: [phone]  

## 2013-01-26 NOTE — Interval H&P Note (Signed)
History and Physical Interval Note:  01/26/2013 9:13 AM  Julian [name]  has presented today for surgery, with the diagnosis of instage renal   The various methods of treatment have been discussed with the patient and family. After consideration of risks, benefits and other options for treatment, the patient has consented to  Procedure(s): SHUNTOGRAM (Right) as a surgical intervention .  The patient's history has been reviewed, patient examined, no change in status, stable for surgery.  I have reviewed the patient's chart and labs.  Questions were answered to the patient's satisfaction.     FIELDS,CHARLES E

## 2013-01-26 NOTE — Op Note (Signed)
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 The patient was placed in supine position the Angio table. The right upper extremity was prepped and draped in usual sterile fashion. Ultrasound was used to identify the patient's right upper arm graft. Local anesthesia was ruptured over the graft. A micropuncture needle was used to cannulate the graft. The micropuncture wire threaded through the needle into the upper arm graft. The guidewire was left in place after the needle was removed. The micropuncture sheath was placed over the guidewire. The guidewire was then removed. Contrast angiogram was then performed through the sheath. The right subclavian vein is patent. There is irregular surface at the right innominate subclavian vein junction. There is a right internal jugular vein catheter which in feet flow through the superior vena cava to the significant amount of collateral formation in the right base of the neck. Next compression was held on the graft to reflux contrast across the arterial anastomosis. Peritoneal anastomosis is widely patent. Oblique views of the venous anastomosis were performed the venous anastomosis is widely patent.  At this point a left central renogram was performed through peripheral IV for planning purposes to try to remove the right-sided catheter to improve flow. Left upper extremity central venogram was performed which shows the left subclavian vein is occluded. At this point a figure-of-eight stitch was placed around the sheath in the right arm. Sheath was then removed and hemostasis obtained with direct pressure. The  patient tolerated procedure well and there were no complications. The patient was taken to the holding area in stable condition.  Operative management: We'll have dialysis center again cannulating his right upper arm graft. If they are successful in using this we will remove his right-sided catheter which should improve flow into the central venous system and decrease arm swelling. If there is difficulty in cannulating the graft we may have to developed a new plan. He may potentially need a short term femoral vein dialysis catheter until the right arm graft is usable. If he has persistent swelling of the right upper extremity after removal of this catheter, we could consider angioplasty of his right central venous system.  Fabienne Bruns, MD [facility] Office: [phone] Pager: [phone]

## 2013-01-29 ENCOUNTER — Ambulatory Visit (INDEPENDENT_AMBULATORY_CARE_PROVIDER_SITE_OTHER): Payer: PRIVATE HEALTH INSURANCE | Admitting: Gastroenterology

## 2013-01-29 ENCOUNTER — Encounter: Payer: Self-pay | Admitting: Gastroenterology

## 2013-01-29 VITALS — BP 115/59 | HR 75 | Temp 98.0°F | Ht 69.0 in

## 2013-01-29 DIAGNOSIS — A0472 Enterocolitis due to Clostridium difficile, not specified as recurrent: Secondary | ICD-10-CM
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
## 2013-01-29 NOTE — Patient Instructions (Addendum)
Continue to take Bentyl before meals and at bedtime, no more than 4 per day.   Please call us if anyone prescribes antibiotics OR you start having diarrhea again.   Otherwise, we will see you in 6 months!

## 2013-01-29 NOTE — Assessment & Plan Note (Signed)
Clinically improved after second round of Flagyl Aug 2014 (empirically) and Bentyl for supportive measures. If placed on abx, would recommend empiric Flagyl in conjunction. Return in 6 months.

## 2013-01-30 NOTE — Progress Notes (Signed)
CC'd to PCP 

## 2013-01-31 ENCOUNTER — Telehealth: Payer: Self-pay | Admitting: Family Medicine

## 2013-01-31 MED ORDER — OXYCODONE-ACETAMINOPHEN 10-325 MG PO TABS: 1.0000 | ORAL_TABLET | Freq: Two times a day (BID) | ORAL | Status: DC | PRN

## 2013-01-31 NOTE — Telephone Encounter (Signed)
I will change him to 10-325mg  twice a day. He can pick up Friday

## 2013-01-31 NOTE — Telephone Encounter (Signed)
Pt had recent difficulty with his new graft placed, increased pain, will change to 10-325mg 

## 2013-01-31 NOTE — Telephone Encounter (Signed)
Pt aware to pick up on Friday

## 2013-01-31 NOTE — Telephone Encounter (Signed)
Pt says that the Percocet 5-325 is not helping. He said he was on 10-325 which worked. He wants to know if you can give him something stronger to help with the pain.

## 2013-02-06 ENCOUNTER — Ambulatory Visit (INDEPENDENT_AMBULATORY_CARE_PROVIDER_SITE_OTHER): Payer: PRIVATE HEALTH INSURANCE | Admitting: Family Medicine

## 2013-02-06 ENCOUNTER — Encounter: Payer: Self-pay | Admitting: Family Medicine

## 2013-02-06 VITALS — BP 120/76 | HR 76 | Temp 97.6°F | Resp 18

## 2013-02-06 DIAGNOSIS — I10 Essential (primary) hypertension: Secondary | ICD-10-CM

## 2013-02-06 DIAGNOSIS — N186 End stage renal disease: Secondary | ICD-10-CM

## 2013-02-06 DIAGNOSIS — M549 Dorsalgia, unspecified: Secondary | ICD-10-CM

## 2013-02-06 DIAGNOSIS — Z992 Dependence on renal dialysis: Secondary | ICD-10-CM

## 2013-02-06 DIAGNOSIS — E119 Type 2 diabetes mellitus without complications: Secondary | ICD-10-CM

## 2013-02-06 NOTE — Patient Instructions (Addendum)
Continue current medications You have to call to get pain meds once a month F/U 4 months

## 2013-02-06 NOTE — Assessment & Plan Note (Signed)
Unchanged, doing well

## 2013-02-06 NOTE — Assessment & Plan Note (Signed)
A1C 6%, doing well off meds ESRD- no urine mirco needed No ACEI FLP at goal without statin podiatry not needed

## 2013-02-06 NOTE — Progress Notes (Signed)
  Subjective:    Patient ID: Julian [name], male    DOB: 1946-01-26, 67 y.o.   MRN: [id number]  HPI Pt here to f/u chronic medical problems. 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 Plan to have chest cath removed in next few weeks. DM- off meds doing well.  Chronic pain- improved with percocet Meds reviewed   Review of Systems  GEN- denies fatigue, fever, weight loss,weakness, recent illness HEENT- denies eye drainage, change in vision, nasal discharge, CVS- denies chest pain, palpitations RESP- denies SOB, cough, wheeze ABD- denies N/V, change in stools, abd pain GU- denies dysuria, hematuria, dribbling, incontinence MSK- denies joint pain, muscle aches, injury Neuro- denies headache, dizziness, syncope, seizure activity      Objective:   Physical Exam GEN- NAD, alert and oriented x3 HEENT- PERRL, EOMI, non injected sclera, pink conjunctiva, MMM, oropharynx clear Neck- Supple, no LAD, no bruit CVS- RRR, no murmur RESP-CTAB EXT- RUE  non pitting edema from arm to hand ,  Right BKA, left foot- great toe only Pulses- Radial 2+, DP diminished left foot         Assessment & Plan:

## 2013-02-06 NOTE — Assessment & Plan Note (Signed)
Now on percocet 10mg , much improved

## 2013-02-06 NOTE — Assessment & Plan Note (Signed)
At goal.  

## 2013-02-21 ENCOUNTER — Telehealth: Payer: Self-pay | Admitting: Family Medicine

## 2013-02-21 NOTE — Telephone Encounter (Signed)
Please call him he has a problem with one of his medications

## 2013-02-21 NOTE — Telephone Encounter (Signed)
Called pt back and he stated that he had accidentally called [place]. He wanted to talk to another nurse from another dr. Isidore Moos.

## 2013-02-24 ENCOUNTER — Emergency Department (HOSPITAL_COMMUNITY): Payer: PRIVATE HEALTH INSURANCE

## 2013-02-24 ENCOUNTER — Encounter (HOSPITAL_COMMUNITY): Payer: Self-pay | Admitting: Emergency Medicine

## 2013-02-24 ENCOUNTER — Emergency Department (HOSPITAL_COMMUNITY)
Admission: EM | Admit: 2013-02-24 | Discharge: 2013-02-24 | Disposition: A | Discharge status: Home / Self Care | Payer: PRIVATE HEALTH INSURANCE | Attending: Emergency Medicine | Admitting: Emergency Medicine

## 2013-02-24 DIAGNOSIS — W06XXXA Fall from bed, initial encounter: Secondary | ICD-10-CM | POA: Insufficient documentation

## 2013-02-24 DIAGNOSIS — Z8619 Personal history of other infectious and parasitic diseases: Secondary | ICD-10-CM | POA: Insufficient documentation

## 2013-02-24 DIAGNOSIS — Z992 Dependence on renal dialysis: Secondary | ICD-10-CM | POA: Insufficient documentation

## 2013-02-24 DIAGNOSIS — G8929 Other chronic pain: Secondary | ICD-10-CM | POA: Insufficient documentation

## 2013-02-24 DIAGNOSIS — I120 Hypertensive chronic kidney disease with stage 5 chronic kidney disease or end stage renal disease: Secondary | ICD-10-CM | POA: Insufficient documentation

## 2013-02-24 DIAGNOSIS — M549 Dorsalgia, unspecified: Secondary | ICD-10-CM

## 2013-02-24 DIAGNOSIS — N186 End stage renal disease: Secondary | ICD-10-CM | POA: Insufficient documentation

## 2013-02-24 DIAGNOSIS — Z79899 Other long term (current) drug therapy: Secondary | ICD-10-CM | POA: Insufficient documentation

## 2013-02-24 DIAGNOSIS — Y929 Unspecified place or not applicable: Secondary | ICD-10-CM | POA: Insufficient documentation

## 2013-02-24 DIAGNOSIS — Y9389 Activity, other specified: Secondary | ICD-10-CM | POA: Insufficient documentation

## 2013-02-24 DIAGNOSIS — E669 Obesity, unspecified: Secondary | ICD-10-CM | POA: Insufficient documentation

## 2013-02-24 DIAGNOSIS — Z8739 Personal history of other diseases of the musculoskeletal system and connective tissue: Secondary | ICD-10-CM | POA: Insufficient documentation

## 2013-02-24 DIAGNOSIS — Z8701 Personal history of pneumonia (recurrent): Secondary | ICD-10-CM | POA: Insufficient documentation

## 2013-02-24 DIAGNOSIS — E119 Type 2 diabetes mellitus without complications: Secondary | ICD-10-CM | POA: Insufficient documentation

## 2013-02-24 DIAGNOSIS — IMO0002 Reserved for concepts with insufficient information to code with codable children: Secondary | ICD-10-CM | POA: Insufficient documentation

## 2013-02-24 MED ORDER — OXYCODONE-ACETAMINOPHEN 5-325 MG PO TABS
2.0000 | ORAL_TABLET | Freq: Once | ORAL | Status: AC
  Administered 2013-02-24: 2 via ORAL
  Filled 2013-02-24: qty 2
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
## 2013-02-24 NOTE — ED Notes (Signed)
Pt fell out of bed today, while he was sleeping. Now having back pain. Denies any loc.

## 2013-03-02 ENCOUNTER — Telehealth: Payer: Self-pay | Admitting: Family Medicine

## 2013-03-02 NOTE — Telephone Encounter (Signed)
Needs his percocet refilled . Will be here to pick up on Monday

## 2013-03-02 NOTE — Telephone Encounter (Signed)
Ok to refill 

## 2013-03-05 MED ORDER — OXYCODONE-ACETAMINOPHEN 10-325 MG PO TABS: 1.0000 | ORAL_TABLET | Freq: Two times a day (BID) | ORAL | Status: DC | PRN

## 2013-03-05 NOTE — Telephone Encounter (Signed)
Refilled

## 2013-03-07 ENCOUNTER — Ambulatory Visit: Payer: PRIVATE HEALTH INSURANCE | Admitting: Family Medicine

## 2013-03-13 ENCOUNTER — Ambulatory Visit: Payer: PRIVATE HEALTH INSURANCE | Admitting: Family Medicine

## 2013-03-20 ENCOUNTER — Telehealth: Payer: Self-pay | Admitting: *Deleted

## 2013-03-20 NOTE — Telephone Encounter (Signed)
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 He is actually already seen ophthalmology and advised him that he can just call back for prescription for new glasses. He became upset when he was asked why he called his primary doctor's office for eye glasses and therefore hung up the phone.

## 2013-04-03 ENCOUNTER — Encounter: Payer: Self-pay | Admitting: Family Medicine

## 2013-04-03 ENCOUNTER — Ambulatory Visit (INDEPENDENT_AMBULATORY_CARE_PROVIDER_SITE_OTHER): Payer: PRIVATE HEALTH INSURANCE | Admitting: Family Medicine

## 2013-04-03 VITALS — BP 120/80 | HR 84 | Temp 98.1°F | Resp 18

## 2013-04-03 DIAGNOSIS — M549 Dorsalgia, unspecified: Secondary | ICD-10-CM

## 2013-04-03 MED ORDER — OXYCODONE-ACETAMINOPHEN 10-325 MG PO TABS: 1.0000 | ORAL_TABLET | Freq: Two times a day (BID) | ORAL | Status: DC | PRN

## 2013-04-03 MED ORDER — DICYCLOMINE HCL 10 MG PO CAPS: 10.0000 mg | ORAL_CAPSULE | Freq: Three times a day (TID) | ORAL | Status: DC

## 2013-04-03 MED ORDER — METHOCARBAMOL 500 MG PO TABS: 500.0000 mg | ORAL_TABLET | Freq: Three times a day (TID) | ORAL | Status: DC | PRN

## 2013-04-03 NOTE — Patient Instructions (Signed)
You have muscle strain in your back - take the muscle relaxer's as prescribed Continue your pain medication You can use a heating pad on the area F/U as previous

## 2013-04-03 NOTE — Progress Notes (Signed)
  Subjective:    Patient ID: Julian [name], male    DOB: 02-Dec-1945, 67 y.o.   MRN: [id number]  HPI  Patient here to followup his emergency room visit secondary to back pain. He was on his bed when he tried to reach for the phone and rolled off of the bed injuring his upper thoracic back region. He did have an x-ray which was negative. The pain has improved but he is running out of his pain medication. He does get some spasms in that same area. He is still following with hemodialysis there have been no changes.  Review of Systems  GEN- denies fatigue, fever, weight loss,weakness, recent illness CVS- denies chest pain, palpitations RESP- denies SOB, cough, wheeze MSK- +joint pain, muscle aches, injury Neuro- denies headache, dizziness, syncope, seizure activity      Objective:   Physical Exam GEN- NAD, alert and oriented x3,sitting in wheelchair CVS- RRR, no murmur RESP-CTAB MSK- Spine NT, +spasms bilat paraspinals, unable to assess ROM  EXT- + chronic venous statis/ edema LLE Pulses- Radial 2+,          Assessment & Plan:

## 2013-04-03 NOTE — Assessment & Plan Note (Signed)
Back pain status post fall one month ago. He has known chronic history of back pain. Fortunately he is in his chair most of the day. He also gets and spasm. I will try him on Robaxin short-term. I will also continue his Percocet at the current dose

## 2013-04-09 ENCOUNTER — Encounter: Payer: Self-pay | Admitting: Family Medicine

## 2013-04-18 ENCOUNTER — Telehealth: Payer: Self-pay | Admitting: *Deleted

## 2013-04-18 MED ORDER — LORATADINE 10 MG PO TABS: 10.0000 mg | ORAL_TABLET | Freq: Every day | ORAL | Status: DC

## 2013-04-18 NOTE — Telephone Encounter (Signed)
Med refilled.

## 2013-04-30 ENCOUNTER — Telehealth: Payer: Self-pay | Admitting: Family Medicine

## 2013-04-30 MED ORDER — OXYCODONE-ACETAMINOPHEN 10-325 MG PO TABS: 1.0000 | ORAL_TABLET | Freq: Two times a day (BID) | ORAL | Status: DC | PRN

## 2013-04-30 NOTE — Telephone Encounter (Signed)
NEEDS RC FOR PAIN MEDICATION- ITS DUE 05/03/13

## 2013-04-30 NOTE — Telephone Encounter (Signed)
refilled 

## 2013-04-30 NOTE — Telephone Encounter (Signed)
Ok to refill 

## 2013-06-05 ENCOUNTER — Ambulatory Visit (INDEPENDENT_AMBULATORY_CARE_PROVIDER_SITE_OTHER): Payer: PRIVATE HEALTH INSURANCE | Admitting: Family Medicine

## 2013-06-05 ENCOUNTER — Encounter: Payer: Self-pay | Admitting: Family Medicine

## 2013-06-05 VITALS — BP 120/80 | HR 78 | Temp 98.5°F | Resp 18

## 2013-06-05 DIAGNOSIS — L97529 Non-pressure chronic ulcer of other part of left foot with unspecified severity: Secondary | ICD-10-CM | POA: Insufficient documentation

## 2013-06-05 DIAGNOSIS — Z89619 Acquired absence of unspecified leg above knee: Secondary | ICD-10-CM

## 2013-06-05 DIAGNOSIS — I10 Essential (primary) hypertension: Secondary | ICD-10-CM

## 2013-06-05 DIAGNOSIS — L97509 Non-pressure chronic ulcer of other part of unspecified foot with unspecified severity: Secondary | ICD-10-CM

## 2013-06-05 DIAGNOSIS — E119 Type 2 diabetes mellitus without complications: Secondary | ICD-10-CM

## 2013-06-05 DIAGNOSIS — S78119A Complete traumatic amputation at level between unspecified hip and knee, initial encounter: Secondary | ICD-10-CM

## 2013-06-05 MED ORDER — OXYCODONE-ACETAMINOPHEN 10-325 MG PO TABS: 1.0000 | ORAL_TABLET | Freq: Two times a day (BID) | ORAL | Status: DC | PRN

## 2013-06-05 NOTE — Progress Notes (Signed)
   Subjective:    Patient ID: Julian [name], male    DOB: 13-Jan-1946, 68 y.o.   MRN: [id number]  HPI Patient here to follow chronic medical problems. He has no specific concerns today. His diabetes mellitus is diet controlled he continues with hemodialysis for his end-stage renal disease He does note he has difficulty cutting the one toenail that he has as well as region his feet. He is doing well and his chronic pain medication  Due for Prevnar 13   Review of Systems  GEN- denies fatigue, fever, weight loss,weakness, recent illness HEENT- denies eye drainage, change in vision, nasal discharge, CVS- denies chest pain, palpitations RESP- denies SOB, cough, wheeze ABD- denies N/V, change in stools, abd pain GU- denies dysuria, hematuria, dribbling, incontinence MSK- denies joint pain, muscle aches, injury Neuro- denies headache, dizziness, syncope, seizure activity      Objective:   Physical Exam  GEN- NAD, alert and oriented x3 HEENT- PERRL, EOMI, non injected sclera, pink conjunctiva, MMM, oropharynx clear  CVS- RRR, no murmur RESP-CTAB EXT- RUE  Graft ,  Right BKA, left foot- great toe only- thickened long nail  Skin- Right great toe, small ulceration with blistering lesion adjacent Pulses- Radial 2+, DP diminished left foot      Assessment & Plan:

## 2013-06-05 NOTE — Patient Instructions (Addendum)
Prevnar 13 shot given Referral to Dr. Berline Lopes for the toe ulcer and nails  Continue current medications Pick up Over the counter- drops (Artifical tears for dry eyes) F/U 4 months

## 2013-06-05 NOTE — Assessment & Plan Note (Signed)
I'm concerned about an early foot ulcer on his great toe. All refer him to podiatry for foot care. The wound was dressed at the bedside

## 2013-06-05 NOTE — Assessment & Plan Note (Signed)
4 check his A1c as well as lipid panel. He's currently diet controlled. I will send him to podiatry so that they can give him foot care he has some calluses as well as long nails on the toe and a new blistering lesion which she is at higher risk for continued ulceration and amputation. He is are ready missing the other digits.

## 2013-06-05 NOTE — Assessment & Plan Note (Signed)
unchanged

## 2013-06-05 NOTE — Assessment & Plan Note (Signed)
Well controlled 

## 2013-06-07 LAB — LIPID PANEL
Cholesterol: 131 mg/dL (ref 0–200)
HDL: 32 mg/dL — AB (ref >39)
LDL Cholesterol: 81 mg/dL (ref 0–99)
TRIGLYCERIDES: 88 mg/dL (ref <150)
Total CHOL/HDL Ratio: 4.1 Ratio
VLDL: 18 mg/dL (ref 0–40)

## 2013-06-07 LAB — HEMOGLOBIN A1C
HEMOGLOBIN A1C: 6.5 % — AB (ref <5.7)
MEAN PLASMA GLUCOSE: 140 mg/dL — AB (ref <117)

## 2013-06-11 ENCOUNTER — Encounter: Payer: Self-pay | Admitting: *Deleted

## 2013-06-21 ENCOUNTER — Encounter: Payer: Self-pay | Admitting: *Deleted

## 2013-07-02 ENCOUNTER — Telehealth: Payer: Self-pay | Admitting: Family Medicine

## 2013-07-02 NOTE — Telephone Encounter (Signed)
Call back number is [phone] or [phone] Pt is needing refill on his oxyCODONE-acetaminophen (PERCOCET) 10-325 MG per tablet

## 2013-07-03 MED ORDER — OXYCODONE-ACETAMINOPHEN 10-325 MG PO TABS
1.0000 | ORAL_TABLET | Freq: Two times a day (BID) | ORAL | Status: DC | PRN
Start: 2013-07-03 — End: 2013-07-31

## 2013-07-03 NOTE — Telephone Encounter (Signed)
Okay to refill? 

## 2013-07-03 NOTE — Telephone Encounter (Signed)
Med printed, ready for provider signature and pt aware.

## 2013-07-03 NOTE — Telephone Encounter (Signed)
?   Ok to refill, last refill 06/05/13

## 2013-07-17 ENCOUNTER — Ambulatory Visit (INDEPENDENT_AMBULATORY_CARE_PROVIDER_SITE_OTHER): Payer: PRIVATE HEALTH INSURANCE | Admitting: Gastroenterology

## 2013-07-17 ENCOUNTER — Encounter (INDEPENDENT_AMBULATORY_CARE_PROVIDER_SITE_OTHER): Payer: Self-pay

## 2013-07-17 ENCOUNTER — Encounter: Payer: Self-pay | Admitting: Gastroenterology

## 2013-07-17 VITALS — BP 115/64 | HR 78 | Temp 97.6°F | Ht 70.0 in

## 2013-07-17 DIAGNOSIS — R197 Diarrhea, unspecified: Secondary | ICD-10-CM

## 2013-07-17 NOTE — Progress Notes (Signed)
Primary Care Physician: Vic Blackbird, MD  Primary Gastroenterologist:  Garfield Cornea, MD   Chief Complaint  Patient presents with  . Follow-up    HPI: Julian [name] is a 68 y.o. male here for six-month followup of diarrhea. He had a colonoscopy March 2014 which is positive for C. difficile treated with Flagyl. Empirically treated again for C. difficile back in August of 2014. When he was seen back in September he was doing well with complete resolution of diarrhea.  Generally he does very well. BM 1-2 times per day. Stools are formed. Occasional loose stool which he relates to certain foods especially greasy or spicy foods. He denies any recent antibiotic use. No blood in the stool or melena. No abdominal pain. No heartburn. He uses Bentyl and Lomotil as needed for loose stool. A few weeks ago he got constipated possibly because of use of these medications he reports. If he "feels like he is going to have loose stool" he takes dicyclomine and/or Lomotil. He's used approximately 20 Lomotil in the past 30 days. He has used approximately 2/3 of his Bentyl which he filled back in November (#120 at that time).   Current Outpatient Prescriptions  Medication Sig Dispense Refill  . cinacalcet (SENSIPAR) 60 MG tablet Take 120 mg by mouth daily. Takes 2 tablets at lunch      . dicyclomine (BENTYL) 10 MG capsule Take 1 capsule (10 mg total) by mouth 4 (four) times daily -  before meals and at bedtime.  120 capsule  3  . diltiazem (CARDIZEM CD) 180 MG 24 hr capsule Take 180 mg by mouth daily.       . diphenoxylate-atropine (LOMOTIL) 2.5-0.025 MG per tablet Take 1 tablet by mouth 3 (three) times daily as needed. For Diarrhea      . hydrALAZINE (APRESOLINE) 50 MG tablet Take 50 mg by mouth 3 (three) times daily.       Marland Kitchen loratadine (CLARITIN) 10 MG tablet Take 1 tablet (10 mg total) by mouth daily.  30 tablet  2  . methocarbamol (ROBAXIN) 500 MG tablet Take 1 tablet (500 mg total) by mouth 3  (three) times daily as needed for muscle spasms.  60 tablet  1  . multivitamin (RENA-VIT) TABS tablet Take 1 tablet by mouth daily.      Marland Kitchen oxyCODONE-acetaminophen (PERCOCET) 10-325 MG per tablet Take 1 tablet by mouth 2 (two) times daily as needed for pain.  60 tablet  0   No current facility-administered medications for this visit.    Allergies as of 07/17/2013  . (No Known Allergies)    ROS:  General: Negative for anorexia, weight loss, fever, chills, fatigue, weakness. ENT: Negative for hoarseness, difficulty swallowing , nasal congestion. CV: Negative for chest pain, angina, palpitations, dyspnea on exertion, peripheral edema.  Respiratory: Negative for dyspnea at rest, dyspnea on exertion, cough, sputum, wheezing.  GI: See history of present illness. GU:  Negative for dysuria, hematuria, urinary incontinence, urinary frequency, nocturnal urination.  Endo: Negative for unusual weight change.    Physical Examination:   BP 115/64  Pulse 78  Temp(Src) 97.6 F (36.4 C) (Oral)  Ht 5\' 10"  (1.778 m)  General: Well-nourished, well-developed in no acute distress.  Eyes: No icterus. Mouth: Oropharyngeal mucosa moist and pink , no lesions erythema or exudate. Lungs: Clear to auscultation bilaterally.  Heart: Regular rate and rhythm, no murmurs rubs or gallops.  Abdomen: Bowel sounds are normal, nontender, nondistended, no hepatosplenomegaly or masses,  no abdominal bruits or hernia , no rebound or guarding.   Extremities: No lower extremity edema on the left. Amputation of the right lower extremity. Neuro: Alert and oriented x 4   Skin: Warm and dry, no jaundice.   Psych: Alert and cooperative, normal mood and affect.

## 2013-07-17 NOTE — Progress Notes (Signed)
cc'd to pcp 

## 2013-07-17 NOTE — Patient Instructions (Addendum)
1. If you have diarrhea that lasts for more than 3 days, please let us know and we will check you for infection (C.Diff) again.  2. You should try the dicyclomine (blue capsule) FIRST for loose stool as needed. Only take the Lomotil (little round tablet) if you have had several loose stools and dicyclomine not working.  3. Office visit in one year.

## 2013-07-17 NOTE — Assessment & Plan Note (Signed)
Overall doing well. He is using Bentyl and Lomotil as needed for occasional loose stool. Really denies any significant diarrhea since his last seen. We discussed utilizing dicyclomine initially when he felt like he needed it. Would hold on Lomotil unless dicyclomine is not working. If he develops diarrhea that lasts for 2-3 days he will let us know we would recheck C. difficile at that time. Otherwise see him back in one year.

## 2013-07-31 ENCOUNTER — Telehealth: Payer: Self-pay | Admitting: Family Medicine

## 2013-07-31 MED ORDER — OXYCODONE-ACETAMINOPHEN 10-325 MG PO TABS: 1.0000 | ORAL_TABLET | Freq: Two times a day (BID) | ORAL | Status: DC | PRN

## 2013-07-31 NOTE — Telephone Encounter (Signed)
Call back number is [phone] or [phone] Pt is needing a refill on oxyCODONE-acetaminophen (PERCOCET) 10-325 MG per tablet Last Refill was on 07/03/13

## 2013-07-31 NOTE — Telephone Encounter (Signed)
Okay to refill? 

## 2013-07-31 NOTE — Telephone Encounter (Signed)
Prescription printed and patient made aware to come to office to pick up.  

## 2013-07-31 NOTE — Telephone Encounter (Signed)
Ok to refill??  Last office visit 06/05/2013.  Last refill 07/03/2013.

## 2013-08-21 ENCOUNTER — Telehealth: Payer: Self-pay | Admitting: Family Medicine

## 2013-08-21 MED ORDER — OXYCODONE-ACETAMINOPHEN 10-325 MG PO TABS: 1.0000 | ORAL_TABLET | Freq: Two times a day (BID) | ORAL | Status: DC | PRN

## 2013-08-21 NOTE — Telephone Encounter (Signed)
Call back number [phone] or [phone] Pt is needing a refll on oxyCODONE-acetaminophen (PERCOCET) 10-325 MG per tablet He knows its not due till 24th but is going ahead and calling

## 2013-08-21 NOTE — Telephone Encounter (Signed)
Ok to refill??  Last office visit 06/05/2013.  Last refill 07/31/2013.

## 2013-08-21 NOTE — Telephone Encounter (Signed)
Okay to refill, specify date for 08/30/13

## 2013-08-21 NOTE — Telephone Encounter (Signed)
Prescription printed and patient made aware to come to office to pick up.   Patient requested to have form for handicap placard.

## 2013-09-04 ENCOUNTER — Encounter: Payer: Self-pay | Admitting: Family Medicine

## 2013-09-04 ENCOUNTER — Ambulatory Visit (INDEPENDENT_AMBULATORY_CARE_PROVIDER_SITE_OTHER): Payer: PRIVATE HEALTH INSURANCE | Admitting: Family Medicine

## 2013-09-04 VITALS — BP 132/88 | HR 76 | Temp 97.4°F | Resp 14

## 2013-09-04 DIAGNOSIS — Z0000 Encounter for general adult medical examination without abnormal findings: Secondary | ICD-10-CM

## 2013-09-04 DIAGNOSIS — Z992 Dependence on renal dialysis: Secondary | ICD-10-CM

## 2013-09-04 DIAGNOSIS — N186 End stage renal disease: Secondary | ICD-10-CM

## 2013-09-04 DIAGNOSIS — Z125 Encounter for screening for malignant neoplasm of prostate: Secondary | ICD-10-CM

## 2013-09-04 DIAGNOSIS — E119 Type 2 diabetes mellitus without complications: Secondary | ICD-10-CM

## 2013-09-04 DIAGNOSIS — I10 Essential (primary) hypertension: Secondary | ICD-10-CM

## 2013-09-04 LAB — BASIC METABOLIC PANEL
BUN: 19 mg/dL (ref 6–23)
CO2: 30 meq/L (ref 19–32)
Calcium: 9.9 mg/dL (ref 8.4–10.5)
Chloride: 95 mEq/L — ABNORMAL LOW (ref 96–112)
Creat: 7.23 mg/dL — ABNORMAL HIGH (ref 0.50–1.35)
GLUCOSE: 112 mg/dL — AB (ref 70–99)
POTASSIUM: 4.1 meq/L (ref 3.5–5.3)
SODIUM: 139 meq/L (ref 135–145)

## 2013-09-04 LAB — HEMOGLOBIN A1C
Hgb A1c MFr Bld: 6.7 % — ABNORMAL HIGH (ref <5.7)
Mean Plasma Glucose: 146 mg/dL — ABNORMAL HIGH (ref <117)

## 2013-09-04 LAB — CBC WITH DIFFERENTIAL/PLATELET
Basophils Absolute: 0 10*3/uL (ref 0.0–0.1)
Basophils Relative: 0 % (ref 0–1)
Eosinophils Absolute: 0.1 10*3/uL (ref 0.0–0.7)
Eosinophils Relative: 2 % (ref 0–5)
HEMATOCRIT: 34.6 % — AB (ref 39.0–52.0)
HEMOGLOBIN: 11.2 g/dL — AB (ref 13.0–17.0)
Lymphocytes Relative: 31 % (ref 12–46)
Lymphs Abs: 1.9 10*3/uL (ref 0.7–4.0)
MCH: 29.9 pg (ref 26.0–34.0)
MCHC: 32.4 g/dL (ref 30.0–36.0)
MCV: 92.3 fL (ref 78.0–100.0)
MONO ABS: 0.5 10*3/uL (ref 0.1–1.0)
MONOS PCT: 9 % (ref 3–12)
NEUTROS ABS: 3.5 10*3/uL (ref 1.7–7.7)
Neutrophils Relative %: 58 % (ref 43–77)
Platelets: 117 10*3/uL — ABNORMAL LOW (ref 150–400)
RBC: 3.75 MIL/uL — ABNORMAL LOW (ref 4.22–5.81)
RDW: 14.5 % (ref 11.5–15.5)
WBC: 6.1 10*3/uL (ref 4.0–10.5)

## 2013-09-04 NOTE — Assessment & Plan Note (Signed)
Well controlled 

## 2013-09-04 NOTE — Progress Notes (Signed)
Patient ID: Julian [name], male   DOB: 1946-04-18, 68 y.o.   MRN: [id number] Subjective:   Patient presents for Medicare Annual/Subsequent preventive examination.   Pt here for CPE, no conerns today,  Previous foot pre-ulcerative callus has healed, his podiatrist did not take his insurance any further  DM- CBG have been in low 100's fasting, off medications  No difficulty with HD Review Past Medical/Family/Social: per EMR   Risk Factors  Current exercise habits: none Dietary issues discussed: yes  Cardiac risk factors: Obesity (BMI >= 30 kg/m2).   Depression Screen  (Note: if answer to either of the following is "Yes", a more complete depression screening is indicated)  Over the past two weeks, have you felt down, depressed or hopeless? No Over the past two weeks, have you felt little interest or pleasure in doing things? No Have you lost interest or pleasure in daily life? No Do you often feel hopeless? No Do you cry easily over simple problems? No   Activities of Daily Living  In your present state of health, do you have any difficulty performing the following activities?:  Driving? No  Managing money? No  Feeding yourself? No  Getting from bed to chair? NO Climbing a flight of stairs? UNABLE Preparing food and eating?: No  Bathing or showering? No  Getting dressed: No  Getting to the toilet? No  Using the toilet:No  Moving around from place to place: yes In the past year have you fallen or had a near fall?:No  Are you sexually active? No  Do you have more than one partner? No   Hearing Difficulties: No  Do you often ask people to speak up or repeat themselves? No  Do you experience ringing or noises in your ears? No Do you have difficulty understanding soft or whispered voices? No  Do you feel that you have a problem with memory? No Do you often misplace items? No  Do you feel safe at home? Yes  Cognitive Testing  Alert? Yes Normal Appearance?Yes  Oriented to  person? Yes Place? Yes  Time? Yes  Recall of three objects? Yes  Can perform simple calculations? Yes  Displays appropriate judgment?Yes  Can read the correct time from a watch face?Yes   List the Names of Other Physician/Practitioners you currently use: Nephrology   Screening Tests / Date Colonoscopy  UTD                    Zostavax - script given Influenza Vaccine - UTD Tetanus/tdap- script given  GEN- denies fatigue, fever, weight loss,weakness, recent illness HEENT- denies eye drainage, change in vision, nasal discharge, CVS- denies chest pain, palpitations RESP- denies SOB, cough, wheeze ABD- denies N/V, change in stools, abd pain GU- denies dysuria, hematuria, dribbling, incontinence MSK- denies joint pain, muscle aches, injury Neuro- denies headache, dizziness, syncope, seizure activity   PHYSICAL GEN- NAD, alert and oriented x3 HEENT- PERRL, EOMI, non injected sclera, pink conjunctiva, MMM, oropharynx clear Neck- Supple, no LABD CVS- RRR, no murmur RESP-CTAB ABD-NABS,soft,NT,ND GU- unable to [facility] pt for prostate exam EXT- + chronic venous statis/ pedal edema LLE Skin - intact,no open lesions on foot Pulses- Radial 2+,      Assessment:    Annual wellness medicare exam   Plan:    During the course of the visit the patient was educated and counseled about appropriate screening and preventive services including:   Colorectal cancer screening  Shingles vaccine. Prescription given to that she can  get the vaccine at the pharmacy or Medicare part D.  Diet review for nutrition referral? Yes ____ Not Indicated __x__  Patient Instructions (the written plan) was given to the patient.  Medicare Attestation  I have personally reviewed:  The patient's medical and social history  Their use of alcohol, tobacco or illicit drugs  Their current medications and supplements  The patient's functional ability including ADLs,fall risks, home safety risks, cognitive, and  hearing and visual impairment  Diet and physical activities  Evidence for depression or mood disorders  The patient's weight, height, BMI, and visual acuity have been recorded in the chart. I have made referrals, counseling, and provided education to the patient based on review of the above and I have provided the patient with a written personalized care plan for preventive services.

## 2013-09-04 NOTE — Assessment & Plan Note (Signed)
Diet controlled No statin due to ESRD Note Prevnar 13 given

## 2013-09-04 NOTE — Patient Instructions (Signed)
We will call with lab results Continue current medications REMOVE MAY APPT F/U 4 months

## 2013-09-05 LAB — PSA, MEDICARE: PSA: 2.16 ng/mL (ref <=4.00)

## 2013-09-17 ENCOUNTER — Inpatient Hospital Stay (HOSPITAL_COMMUNITY)
Admission: EM | Admit: 2013-09-17 | Discharge: 2013-09-20 | DRG: 246 | Disposition: A | Discharge status: Home / Self Care | Payer: PRIVATE HEALTH INSURANCE | Attending: Cardiology | Admitting: Cardiology

## 2013-09-17 ENCOUNTER — Encounter (HOSPITAL_COMMUNITY): Payer: Self-pay | Admitting: Emergency Medicine

## 2013-09-17 ENCOUNTER — Emergency Department (HOSPITAL_COMMUNITY): Payer: PRIVATE HEALTH INSURANCE

## 2013-09-17 DIAGNOSIS — E11319 Type 2 diabetes mellitus with unspecified diabetic retinopathy without macular edema: Secondary | ICD-10-CM | POA: Diagnosis present

## 2013-09-17 DIAGNOSIS — Z992 Dependence on renal dialysis: Secondary | ICD-10-CM

## 2013-09-17 DIAGNOSIS — M549 Dorsalgia, unspecified: Secondary | ICD-10-CM | POA: Diagnosis present

## 2013-09-17 DIAGNOSIS — E1139 Type 2 diabetes mellitus with other diabetic ophthalmic complication: Secondary | ICD-10-CM | POA: Diagnosis present

## 2013-09-17 DIAGNOSIS — Z6833 Body mass index (BMI) 33.0-33.9, adult: Secondary | ICD-10-CM | POA: Diagnosis not present

## 2013-09-17 DIAGNOSIS — Z833 Family history of diabetes mellitus: Secondary | ICD-10-CM

## 2013-09-17 DIAGNOSIS — I2510 Atherosclerotic heart disease of native coronary artery without angina pectoris: Secondary | ICD-10-CM | POA: Diagnosis present

## 2013-09-17 DIAGNOSIS — I498 Other specified cardiac arrhythmias: Secondary | ICD-10-CM | POA: Diagnosis present

## 2013-09-17 DIAGNOSIS — I739 Peripheral vascular disease, unspecified: Secondary | ICD-10-CM | POA: Diagnosis present

## 2013-09-17 DIAGNOSIS — S78119A Complete traumatic amputation at level between unspecified hip and knee, initial encounter: Secondary | ICD-10-CM | POA: Diagnosis not present

## 2013-09-17 DIAGNOSIS — Z79899 Other long term (current) drug therapy: Secondary | ICD-10-CM | POA: Diagnosis not present

## 2013-09-17 DIAGNOSIS — I2589 Other forms of chronic ischemic heart disease: Secondary | ICD-10-CM | POA: Diagnosis present

## 2013-09-17 DIAGNOSIS — Z7982 Long term (current) use of aspirin: Secondary | ICD-10-CM

## 2013-09-17 DIAGNOSIS — E669 Obesity, unspecified: Secondary | ICD-10-CM | POA: Diagnosis present

## 2013-09-17 DIAGNOSIS — I120 Hypertensive chronic kidney disease with stage 5 chronic kidney disease or end stage renal disease: Secondary | ICD-10-CM | POA: Diagnosis present

## 2013-09-17 DIAGNOSIS — R7989 Other specified abnormal findings of blood chemistry: Secondary | ICD-10-CM

## 2013-09-17 DIAGNOSIS — Z9849 Cataract extraction status, unspecified eye: Secondary | ICD-10-CM

## 2013-09-17 DIAGNOSIS — D631 Anemia in chronic kidney disease: Secondary | ICD-10-CM | POA: Diagnosis present

## 2013-09-17 DIAGNOSIS — N2581 Secondary hyperparathyroidism of renal origin: Secondary | ICD-10-CM | POA: Diagnosis present

## 2013-09-17 DIAGNOSIS — I10 Essential (primary) hypertension: Secondary | ICD-10-CM

## 2013-09-17 DIAGNOSIS — R001 Bradycardia, unspecified: Secondary | ICD-10-CM | POA: Diagnosis present

## 2013-09-17 DIAGNOSIS — Z8249 Family history of ischemic heart disease and other diseases of the circulatory system: Secondary | ICD-10-CM

## 2013-09-17 DIAGNOSIS — D696 Thrombocytopenia, unspecified: Secondary | ICD-10-CM | POA: Diagnosis not present

## 2013-09-17 DIAGNOSIS — N186 End stage renal disease: Secondary | ICD-10-CM | POA: Diagnosis present

## 2013-09-17 DIAGNOSIS — R197 Diarrhea, unspecified: Secondary | ICD-10-CM | POA: Diagnosis present

## 2013-09-17 DIAGNOSIS — E119 Type 2 diabetes mellitus without complications: Secondary | ICD-10-CM

## 2013-09-17 DIAGNOSIS — R778 Other specified abnormalities of plasma proteins: Secondary | ICD-10-CM

## 2013-09-17 DIAGNOSIS — I214 Non-ST elevation (NSTEMI) myocardial infarction: Secondary | ICD-10-CM

## 2013-09-17 DIAGNOSIS — G8929 Other chronic pain: Secondary | ICD-10-CM | POA: Diagnosis present

## 2013-09-17 DIAGNOSIS — Z7902 Long term (current) use of antithrombotics/antiplatelets: Secondary | ICD-10-CM

## 2013-09-17 DIAGNOSIS — I442 Atrioventricular block, complete: Secondary | ICD-10-CM

## 2013-09-17 DIAGNOSIS — N039 Chronic nephritic syndrome with unspecified morphologic changes: Secondary | ICD-10-CM

## 2013-09-17 HISTORY — DX: Thrombocytopenia, unspecified: D69.6

## 2013-09-17 HISTORY — DX: Ischemic cardiomyopathy: I25.5

## 2013-09-17 HISTORY — DX: Atrioventricular block, second degree: I44.1

## 2013-09-17 HISTORY — DX: Atrioventricular block, complete: I44.2

## 2013-09-17 HISTORY — DX: Atherosclerotic heart disease of native coronary artery without angina pectoris: I25.10

## 2013-09-17 LAB — CBC WITH DIFFERENTIAL/PLATELET
BASOS ABS: 0 10*3/uL (ref 0.0–0.1)
Basophils Relative: 0 % (ref 0–1)
EOS PCT: 1 % (ref 0–5)
Eosinophils Absolute: 0.1 10*3/uL (ref 0.0–0.7)
HCT: 34.2 % — ABNORMAL LOW (ref 39.0–52.0)
Hemoglobin: 11 g/dL — ABNORMAL LOW (ref 13.0–17.0)
LYMPHS ABS: 1.1 10*3/uL (ref 0.7–4.0)
Lymphocytes Relative: 18 % (ref 12–46)
MCH: 30.2 pg (ref 26.0–34.0)
MCHC: 32.2 g/dL (ref 30.0–36.0)
MCV: 94 fL (ref 78.0–100.0)
Monocytes Absolute: 0.4 10*3/uL (ref 0.1–1.0)
Monocytes Relative: 6 % (ref 3–12)
NEUTROS ABS: 4.6 10*3/uL (ref 1.7–7.7)
Neutrophils Relative %: 75 % (ref 43–77)
PLATELETS: 114 10*3/uL — AB (ref 150–400)
RBC: 3.64 MIL/uL — ABNORMAL LOW (ref 4.22–5.81)
RDW: 14.2 % (ref 11.5–15.5)
WBC: 6.2 10*3/uL (ref 4.0–10.5)

## 2013-09-17 LAB — BASIC METABOLIC PANEL
BUN: 24 mg/dL — ABNORMAL HIGH (ref 6–23)
CALCIUM: 10.4 mg/dL (ref 8.4–10.5)
CO2: 28 mEq/L (ref 19–32)
CREATININE: 6.04 mg/dL — AB (ref 0.50–1.35)
Chloride: 89 mEq/L — ABNORMAL LOW (ref 96–112)
GFR calc Af Amer: 10 mL/min — ABNORMAL LOW (ref >90)
GFR calc non Af Amer: 9 mL/min — ABNORMAL LOW (ref >90)
GLUCOSE: 241 mg/dL — AB (ref 70–99)
Potassium: 3.6 mEq/L — ABNORMAL LOW (ref 3.7–5.3)
Sodium: 135 mEq/L — ABNORMAL LOW (ref 137–147)

## 2013-09-17 LAB — MRSA PCR SCREENING: MRSA by PCR: NEGATIVE

## 2013-09-17 LAB — CBG MONITORING, ED: GLUCOSE-CAPILLARY: 219 mg/dL — AB (ref 70–99)

## 2013-09-17 LAB — GLUCOSE, CAPILLARY: Glucose-Capillary: 150 mg/dL — ABNORMAL HIGH (ref 70–99)

## 2013-09-17 LAB — TROPONIN I: Troponin I: 1.81 ng/mL (ref <0.30)

## 2013-09-17 MED ORDER — DIPHENOXYLATE-ATROPINE 2.5-0.025 MG PO TABS: 1.0000 | ORAL_TABLET | Freq: Three times a day (TID) | ORAL | Status: DC | PRN

## 2013-09-17 MED ORDER — ASPIRIN EC 81 MG PO TBEC
81.0000 mg | DELAYED_RELEASE_TABLET | Freq: Every day | ORAL | Status: DC
  Administered 2013-09-18 – 2013-09-20 (×3): 81 mg via ORAL
  Filled 2013-09-17 (×3): qty 1

## 2013-09-17 MED ORDER — ATROPINE SULFATE 0.1 MG/ML IJ SOLN
1.0000 mg | INTRAMUSCULAR | Status: DC | PRN
  Filled 2013-09-17: qty 10

## 2013-09-17 MED ORDER — ASPIRIN 81 MG PO CHEW
81.0000 mg | CHEWABLE_TABLET | ORAL | Status: AC
  Administered 2013-09-18: 81 mg via ORAL
  Filled 2013-09-17: qty 1

## 2013-09-17 MED ORDER — HEPARIN (PORCINE) IN NACL 100-0.45 UNIT/ML-% IJ SOLN
1600.0000 [IU]/h | INTRAMUSCULAR | Status: DC
  Administered 2013-09-17: 1200 [IU]/h via INTRAVENOUS
  Administered 2013-09-18: 1600 [IU]/h via INTRAVENOUS
  Filled 2013-09-17 (×3): qty 250

## 2013-09-17 MED ORDER — SODIUM CHLORIDE 0.9 % IJ SOLN: 3.0000 mL | INTRAMUSCULAR | Status: DC | PRN

## 2013-09-17 MED ORDER — INSULIN ASPART 100 UNIT/ML ~~LOC~~ SOLN
0.0000 [IU] | Freq: Three times a day (TID) | SUBCUTANEOUS | Status: DC
  Administered 2013-09-19 – 2013-09-20 (×3): 2 [IU] via SUBCUTANEOUS

## 2013-09-17 MED ORDER — SODIUM CHLORIDE 0.9 % IV SOLN: 250.0000 mL | INTRAVENOUS | Status: DC | PRN

## 2013-09-17 MED ORDER — SODIUM CHLORIDE 0.9 % IV BOLUS (SEPSIS)
500.0000 mL | Freq: Once | INTRAVENOUS | Status: AC
  Administered 2013-09-17: 500 mL via INTRAVENOUS

## 2013-09-17 MED ORDER — OXYCODONE-ACETAMINOPHEN 5-325 MG PO TABS
1.0000 | ORAL_TABLET | Freq: Four times a day (QID) | ORAL | Status: DC | PRN
  Administered 2013-09-18: 1 via ORAL
  Filled 2013-09-17: qty 1

## 2013-09-17 MED ORDER — DICYCLOMINE HCL 10 MG PO CAPS
10.0000 mg | ORAL_CAPSULE | Freq: Three times a day (TID) | ORAL | Status: DC
  Administered 2013-09-17 – 2013-09-20 (×8): 10 mg via ORAL
  Filled 2013-09-17 (×14): qty 1

## 2013-09-17 MED ORDER — CINACALCET HCL 30 MG PO TABS
120.0000 mg | ORAL_TABLET | Freq: Every day | ORAL | Status: DC
  Administered 2013-09-18 – 2013-09-20 (×3): 120 mg via ORAL
  Filled 2013-09-17 (×4): qty 4

## 2013-09-17 MED ORDER — OXYCODONE HCL 5 MG PO TABS: 5.0000 mg | ORAL_TABLET | Freq: Four times a day (QID) | ORAL | Status: DC | PRN

## 2013-09-17 MED ORDER — HEPARIN BOLUS VIA INFUSION
4000.0000 [IU] | Freq: Once | INTRAVENOUS | Status: AC
  Administered 2013-09-17: 4000 [IU] via INTRAVENOUS
  Filled 2013-09-17: qty 4000

## 2013-09-17 MED ORDER — ATORVASTATIN CALCIUM 80 MG PO TABS
80.0000 mg | ORAL_TABLET | Freq: Every day | ORAL | Status: DC
  Administered 2013-09-19 – 2013-09-20 (×2): 80 mg via ORAL
  Filled 2013-09-17 (×3): qty 1

## 2013-09-17 MED ORDER — SODIUM CHLORIDE 0.9 % IJ SOLN: 3.0000 mL | Freq: Two times a day (BID) | INTRAMUSCULAR | Status: DC

## 2013-09-17 MED ORDER — SODIUM CHLORIDE 0.9 % IJ SOLN
3.0000 mL | Freq: Two times a day (BID) | INTRAMUSCULAR | Status: DC
  Administered 2013-09-17 – 2013-09-19 (×3): 3 mL via INTRAVENOUS

## 2013-09-17 MED ORDER — OXYCODONE-ACETAMINOPHEN 10-325 MG PO TABS: 1.0000 | ORAL_TABLET | Freq: Four times a day (QID) | ORAL | Status: DC | PRN

## 2013-09-17 MED ORDER — NITROGLYCERIN 0.4 MG SL SUBL: 0.4000 mg | SUBLINGUAL_TABLET | SUBLINGUAL | Status: DC | PRN

## 2013-09-17 MED ORDER — OXYCODONE-ACETAMINOPHEN 5-325 MG PO TABS: 1.0000 | ORAL_TABLET | Freq: Four times a day (QID) | ORAL | Status: DC | PRN
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
## 2013-09-17 NOTE — ED Notes (Signed)
Report called to RN at 2900 at [facility]. [facility] in route.
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
## 2013-09-17 NOTE — ED Notes (Signed)
Home Health Aide: [phone] or [phone] Carmie Kanner)

## 2013-09-17 NOTE — ED Notes (Signed)
Lab called with troponin of 1.81. Dr Lacinda Axon notified of result.

## 2013-09-17 NOTE — ED Notes (Signed)
Pt comes from home via EMS with c/o intermittent generalized weakness. Pt had dialysis tx this morning and states symptoms began afterwards. Pt is A&Ox4. Pt denies any pain.

## 2013-09-18 ENCOUNTER — Encounter (HOSPITAL_COMMUNITY)
Admission: EM | Disposition: A | Discharge status: Home / Self Care | Payer: Self-pay | Source: Home / Self Care | Attending: Cardiology

## 2013-09-18 ENCOUNTER — Telehealth: Payer: Self-pay | Admitting: *Deleted

## 2013-09-18 DIAGNOSIS — I2510 Atherosclerotic heart disease of native coronary artery without angina pectoris: Secondary | ICD-10-CM

## 2013-09-18 DIAGNOSIS — I498 Other specified cardiac arrhythmias: Secondary | ICD-10-CM | POA: Diagnosis not present

## 2013-09-18 DIAGNOSIS — I214 Non-ST elevation (NSTEMI) myocardial infarction: Principal | ICD-10-CM

## 2013-09-18 DIAGNOSIS — I517 Cardiomegaly: Secondary | ICD-10-CM

## 2013-09-18 DIAGNOSIS — N186 End stage renal disease: Secondary | ICD-10-CM

## 2013-09-18 DIAGNOSIS — R799 Abnormal finding of blood chemistry, unspecified: Secondary | ICD-10-CM

## 2013-09-18 HISTORY — PX: LEFT HEART CATHETERIZATION WITH CORONARY ANGIOGRAM: SHX5451

## 2013-09-18 LAB — BASIC METABOLIC PANEL
BUN: 34 mg/dL — AB (ref 6–23)
CO2: 24 meq/L (ref 19–32)
Calcium: 10 mg/dL (ref 8.4–10.5)
Chloride: 92 mEq/L — ABNORMAL LOW (ref 96–112)
Creatinine, Ser: 8 mg/dL — ABNORMAL HIGH (ref 0.50–1.35)
GFR calc Af Amer: 7 mL/min — ABNORMAL LOW (ref >90)
GFR, EST NON AFRICAN AMERICAN: 6 mL/min — AB (ref >90)
GLUCOSE: 138 mg/dL — AB (ref 70–99)
POTASSIUM: 4 meq/L (ref 3.7–5.3)
Sodium: 136 mEq/L — ABNORMAL LOW (ref 137–147)

## 2013-09-18 LAB — TSH: TSH: 0.515 u[IU]/mL (ref 0.350–4.500)

## 2013-09-18 LAB — COMPREHENSIVE METABOLIC PANEL
ALT: 39 U/L (ref 0–53)
AST: 23 U/L (ref 0–37)
Albumin: 2.8 g/dL — ABNORMAL LOW (ref 3.5–5.2)
Alkaline Phosphatase: 142 U/L — ABNORMAL HIGH (ref 39–117)
BUN: 32 mg/dL — ABNORMAL HIGH (ref 6–23)
CALCIUM: 10.1 mg/dL (ref 8.4–10.5)
CO2: 26 mEq/L (ref 19–32)
CREATININE: 7.27 mg/dL — AB (ref 0.50–1.35)
Chloride: 94 mEq/L — ABNORMAL LOW (ref 96–112)
GFR calc Af Amer: 8 mL/min — ABNORMAL LOW (ref >90)
GFR calc non Af Amer: 7 mL/min — ABNORMAL LOW (ref >90)
GLUCOSE: 141 mg/dL — AB (ref 70–99)
Potassium: 3.6 mEq/L — ABNORMAL LOW (ref 3.7–5.3)
Sodium: 136 mEq/L — ABNORMAL LOW (ref 137–147)
Total Bilirubin: 0.4 mg/dL (ref 0.3–1.2)
Total Protein: 6.8 g/dL (ref 6.0–8.3)

## 2013-09-18 LAB — GLUCOSE, CAPILLARY
Glucose-Capillary: 136 mg/dL — ABNORMAL HIGH (ref 70–99)
Glucose-Capillary: 127 mg/dL — ABNORMAL HIGH (ref 70–99)
Glucose-Capillary: 126 mg/dL — ABNORMAL HIGH (ref 70–99)
Glucose-Capillary: 111 mg/dL — ABNORMAL HIGH (ref 70–99)

## 2013-09-18 LAB — CBC
HCT: 30.7 % — ABNORMAL LOW (ref 39.0–52.0)
Hemoglobin: 9.8 g/dL — ABNORMAL LOW (ref 13.0–17.0)
MCH: 30.1 pg (ref 26.0–34.0)
MCHC: 31.9 g/dL (ref 30.0–36.0)
MCV: 94.2 fL (ref 78.0–100.0)
PLATELETS: 102 10*3/uL — AB (ref 150–400)
RBC: 3.26 MIL/uL — ABNORMAL LOW (ref 4.22–5.81)
RDW: 14.2 % (ref 11.5–15.5)
WBC: 6.9 10*3/uL (ref 4.0–10.5)

## 2013-09-18 LAB — POCT ACTIVATED CLOTTING TIME
ACTIVATED CLOTTING TIME: 442 s
ACTIVATED CLOTTING TIME: 188 s
Activated Clotting Time: 227 seconds
Activated Clotting Time: 193 seconds

## 2013-09-18 LAB — MAGNESIUM: MAGNESIUM: 1.8 mg/dL (ref 1.5–2.5)

## 2013-09-18 LAB — PROTIME-INR
INR: 1.16 (ref 0.00–1.49)
Prothrombin Time: 14.6 seconds (ref 11.6–15.2)

## 2013-09-18 LAB — LIPID PANEL
Cholesterol: 96 mg/dL (ref 0–200)
HDL: 31 mg/dL — ABNORMAL LOW (ref >39)
LDL Cholesterol: 53 mg/dL (ref 0–99)
TRIGLYCERIDES: 59 mg/dL (ref <150)
Total CHOL/HDL Ratio: 3.1 RATIO
VLDL: 12 mg/dL (ref 0–40)

## 2013-09-18 LAB — HEMOGLOBIN A1C
HEMOGLOBIN A1C: 6.8 % — AB (ref <5.7)
MEAN PLASMA GLUCOSE: 148 mg/dL — AB (ref <117)

## 2013-09-18 LAB — TROPONIN I
TROPONIN I: 1.54 ng/mL — AB (ref <0.30)
Troponin I: 1.7 ng/mL (ref <0.30)
Troponin I: 1.6 ng/mL (ref <0.30)

## 2013-09-18 LAB — HEPARIN LEVEL (UNFRACTIONATED): Heparin Unfractionated: <0.1 IU/mL — ABNORMAL LOW (ref 0.30–0.70)

## 2013-09-18 SURGERY — LEFT HEART CATHETERIZATION WITH CORONARY ANGIOGRAM
Anesthesia: LOCAL

## 2013-09-18 MED ORDER — SODIUM CHLORIDE 0.9 % IV SOLN
0.2500 mg/kg/h | INTRAVENOUS | Status: AC
  Filled 2013-09-18: qty 250

## 2013-09-18 MED ORDER — HEPARIN (PORCINE) IN NACL 100-0.45 UNIT/ML-% IJ SOLN
1600.0000 [IU]/h | INTRAMUSCULAR | Status: DC
  Administered 2013-09-19: 1600 [IU]/h via INTRAVENOUS
  Filled 2013-09-18: qty 250

## 2013-09-18 MED ORDER — LIDOCAINE HCL (PF) 1 % IJ SOLN
INTRAMUSCULAR | Status: AC
  Filled 2013-09-18: qty 30

## 2013-09-18 MED ORDER — FENTANYL CITRATE 0.05 MG/ML IJ SOLN
INTRAMUSCULAR | Status: AC
  Filled 2013-09-18: qty 2

## 2013-09-18 MED ORDER — SODIUM CHLORIDE 0.9 % IJ SOLN: 3.0000 mL | INTRAMUSCULAR | Status: DC | PRN

## 2013-09-18 MED ORDER — SODIUM CHLORIDE 0.9 % IV SOLN: 250.0000 mL | INTRAVENOUS | Status: DC | PRN

## 2013-09-18 MED ORDER — TICAGRELOR 90 MG PO TABS
ORAL_TABLET | ORAL | Status: AC
  Filled 2013-09-18: qty 1

## 2013-09-18 MED ORDER — HEPARIN (PORCINE) IN NACL 2-0.9 UNIT/ML-% IJ SOLN
INTRAMUSCULAR | Status: AC
  Filled 2013-09-18: qty 1000

## 2013-09-18 MED ORDER — HEPARIN BOLUS VIA INFUSION
3000.0000 [IU] | Freq: Once | INTRAVENOUS | Status: AC
  Administered 2013-09-18: 3000 [IU] via INTRAVENOUS
  Filled 2013-09-18: qty 3000

## 2013-09-18 MED ORDER — NITROGLYCERIN 0.2 MG/ML ON CALL CATH LAB
INTRAVENOUS | Status: AC
  Filled 2013-09-18: qty 1

## 2013-09-18 MED ORDER — SODIUM CHLORIDE 0.9 % IJ SOLN: 3.0000 mL | Freq: Two times a day (BID) | INTRAMUSCULAR | Status: DC

## 2013-09-18 MED ORDER — BIVALIRUDIN 250 MG IV SOLR
INTRAVENOUS | Status: AC
  Filled 2013-09-18: qty 250

## 2013-09-18 MED ORDER — TICAGRELOR 90 MG PO TABS
90.0000 mg | ORAL_TABLET | Freq: Two times a day (BID) | ORAL | Status: DC
  Administered 2013-09-18 – 2013-09-20 (×5): 90 mg via ORAL
  Filled 2013-09-18 (×5): qty 1

## 2013-09-18 MED ORDER — MIDAZOLAM HCL 2 MG/2ML IJ SOLN
INTRAMUSCULAR | Status: AC
  Filled 2013-09-18: qty 2
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
## 2013-09-18 NOTE — Interval H&P Note (Signed)
History and Physical Interval Note:  09/18/2013 1:15 PM  Julian [name]  has presented today for surgery, with the diagnosis of Complete Heart Block & NSTEMI.  The various methods of treatment have been discussed with the patient and family. After consideration of risks, benefits and other options for treatment, the patient has consented to  Procedure(s): LEFT HEART CATHETERIZATION WITH CORONARY ANGIOGRAM (N/A) +/- PCI  as a surgical intervention .  The patient's history has been reviewed, patient examined, no change in status, stable for surgery.  I have reviewed the patient's chart and labs.  Questions were answered to the patient's satisfaction.     Julian [name]  Cath Lab Visit (complete for each Cath Lab visit)  Clinical Evaluation Leading to the Procedure:   ACS: yes  Non-ACS:    Anginal Classification: No Symptoms  Anti-ischemic medical therapy: Minimal Therapy (1 class of medications)  --  CCB held due to Bradycardia  Non-Invasive Test Results: No non-invasive testing performed  Prior CABG: No previous CABG

## 2013-09-18 NOTE — Progress Notes (Signed)
EKG CRITICAL VALUE     12 lead EKG performed.  Critical value noted.  Delford Field, RN notified.   Julian [name], CCT 09/18/2013 7:10 AM

## 2013-09-18 NOTE — Care Management Note (Addendum)
    Page 1 of 2   09/20/2013     1:19:26 PM CARE MANAGEMENT NOTE 09/20/2013  Patient:  ADYN, SERNA   Account Number:  [id number]  Date Initiated:  09/18/2013  Documentation initiated by:  Elissa Hefty  Subjective/Objective Assessment:   adm w mi     Action/Plan:   lives alone  pcp dr Vic Blackbird   Anticipated DC Date:     Anticipated DC Plan:  [place]  CM consult  Medication Assistance      [facility] Choice  HOME HEALTH   Choice offered to / List presented to:  C-1 Patient        [place] arranged  HH-1 RN      [place].   Status of service:   Medicare Important Message given?   (If response is "NO", the following Medicare IM given date fields will be blank) Date Medicare IM given:   Date Additional Medicare IM given:    Discharge Disposition:  [place]  Per UR Regulation:  Reviewed for med. necessity/level of care/duration of stay  If discussed at [place] of Stay Meetings, dates discussed:    Comments:  5/14  1317 debbie Joann Kulpa rn,bsn spoke w pt. he has used [facility] in past and would like them for [facility]. ref to [facility] for [facility]. spoke w car [facility] [phone] and they have brilinta in stock.  5/12 1127 debbie Westyn Driggers rn,bsn chart states has aid that assists at home. spoke w pt. he states he has ins that assists w meds. gave pt 30day free brilinta card. pt has no copay or no prior needed for brilinta.

## 2013-09-18 NOTE — Telephone Encounter (Signed)
Message copied by Sheral Flow on Tue Sep 18, 2013  9:41 AM ------      Message from: Lenore Manner      Created: Tue Sep 18, 2013  8:59 AM      Regarding: pacemaker      Contact: [phone]       Pt is at [facility] [place] and he was seen yesterday for being light headed and when the blood work came back stated that he had a light heart attack and they are wanting to put in a pace maker today and he is wanting to run this by Dr Buelah Manis to make sure he is doing the right thing ------
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
## 2013-09-18 NOTE — Telephone Encounter (Signed)
Noted  

## 2013-09-18 NOTE — Telephone Encounter (Signed)
I spoke with pt briefly noted hypotension demand ischemia NSTEMI, now with bradycardia and ? Heart block Let him know I agree with cardiology and he should have pacer put in if is heart rate does not come up

## 2013-09-18 NOTE — Progress Notes (Signed)
ANTICOAGULATION CONSULT NOTE - Follow Up Consult  Pharmacy Consult for heparin Indication: NSTEMI  Labs:  Recent Labs  09/17/13 1515 09/18/13 0155 09/18/13 0250 09/18/13 0610  HGB 11.0*  --  9.8*  --   HCT 34.2*  --  30.7*  --   PLT 114*  --  102*  --   LABPROT  --   --   --  14.6  INR  --   --   --  1.16  HEPARINUNFRC  --   --   --  <0.10*  CREATININE 6.04*  --  7.27*  --   TROPONINI 1.81* 1.54* 1.70*  --     Assessment: 68yo male undetectable on heparin with initial dosing for NSTEMI.  Goal of Therapy:  Heparin level 0.3-0.7 units/ml   Plan:  Will rebolus with heparin 3000 units x1 and increase gtt by 4 units/kg/hr to 1600 units/hr and f/u after cath.  Wynona Neat, PharmD, BCPS  09/18/2013,7:56 AM

## 2013-09-18 NOTE — Progress Notes (Signed)
ANTICOAGULATION CONSULT NOTE - Follow Up Consult  Pharmacy Consult for heparin Indication: NSTEMI  Labs:  Recent Labs  09/17/13 1515 09/18/13 0155 09/18/13 0250 09/18/13 0610 09/18/13 0955  HGB 11.0*  --  9.8*  --   --   HCT 34.2*  --  30.7*  --   --   PLT 114*  --  102*  --   --   LABPROT  --   --   --  14.6  --   INR  --   --   --  1.16  --   HEPARINUNFRC  --   --   --  <0.10*  --   CREATININE 6.04*  --  7.27*  --  8.00*  TROPONINI 1.81* 1.54* 1.70*  --  1.60*    Assessment: 68yo male to resume heparin 8 hours after sheath pull.  Goal of Therapy:  Heparin level 0.3-0.7 units/ml   Plan:  Will resume heparin at 1600 units/hr at 0700. Check heparin level 6 hours after restart.  Excell Seltzer, PharmD 09/18/2013,10:50 PM

## 2013-09-18 NOTE — Telephone Encounter (Signed)
Patient aware that MD is aware of situation.   Requested MD to advise on Pacemaker.

## 2013-09-18 NOTE — CV Procedure (Signed)
CARDIAC CATHETERIZATION AND PERCUTANEOUS CORONARY INTERVENTION REPORT  NAME:  Julian [name]   MRN: [id number] DOB:  18-Sep-1945   ADMIT DATE: 09/17/2013 Procedure Date: 09/18/2013  INTERVENTIONAL CARDIOLOGIST: [name], M.D., MS PRIMARY CARE PROVIDER: Vic Blackbird, MD PRIMARY CARDIOLOGIST: Carlyle Dolly, M.D.  PATIENT:  Julian [name] is a 68 y.o. male with history of CAD status post right AKA, end-stage renal disease on dialysis, hypertension and diabetes who was transferred to [facility] from [facility] was symptomatically bradycardia in complete heart block.  He was found to have positive troponin suggesting non-STEMI as the possible etiology. He is referred for cardiac catheterization to exclude ischemic etiology for 3 AV Block.  PRE-OPERATIVE DIAGNOSIS:    Non-STEMI  3 AV Block  PROCEDURES PERFORMED:    Left Heart Catheterization Coronary Angiography  PCI of the distal dominant Circumflex with a Xience Alpine DES  PTCA/POBA only of the ostial Left PDA   PROCEDURE:Consent:  Risks of procedure as well as the alternatives and risks of each were explained to the (patient/caregiver).  Consent for procedure obtained. Consent for signed by MD and patient with RN witness -- placed on chart.   PROCEDURE: The patient was brought to the [facility]nd [facility] in the fasting state and prepped and draped in the usual sterile fashion for Left femoral. Radial access was not an option based on the patient being a dialysis patient.  Sterile technique was used including antiseptics, cap, gloves, gown, hand hygiene, mask and sheet.  Skin prep: Chlorhexidine.  Time Out: Verified patient identification, verified procedure, site/side was marked, verified correct patient position, special equipment/implants available, medications/allergies/relevent history reviewed, required imaging and test results available.  Performed  Access: Left  Common Femoral Artery; 5 Fr Sheath -- fluoroscopically guided modified Seldinger technique   Diagnostic Left Heart Catheterization with Coronary Angiography:  5 Fr JL4, JR 4, and Angled Pigtail catheters advanced and exchanged over standard J-wire into the ascending aorta and used for selective coronary artery engagement.  Left Coronary Artery Angiography: JL4  Right Coronary Artery Angiography: JR 4  LV Hemodynamics (LV Gram): Angled pigtail  Sheath:  Sutured in place, to be removed in the TCU with manual pressure held for hemostasis.   Hemodynamics:  Central Aortic / Mean Pressures: 122/46/69 mmHg  Left Ventricular Pressures / EDP: 120/12/24 mmHg  Left Ventriculography:  EF: 25-30 %  Wall Motion: Global hypokinesis with segmental basal inferior severe hypokinesis.  Coronary Anatomy: Left Dominant  Left Main: Large-caliber vessel that bifurcates into the LAD and Circumflex. Mildly calcified but otherwise angiographically normal. LAD: Large-caliber vessel that reaches to the apex there is a short tubular 60-70% hazy lesion at the takeoff of a small diagonal branch. The remainder the LAD and a small diagonal branch are free of significant disease.  Left Circumflex: Very large caliber, dominant vessel it is somewhat tortuous course. There is a very small ramus/marginal branch that appears to be occluded proximally. There is a moderate caliber OM 1 followed by a moderate caliber OM 2 that bifurcates. The superior branch of OM 2 has a roughly 80% stenosis in the 1.5 mm vessel. There is a small PL 1 just after a hazy, thrombotic 95-99% subtotal occlusion of the distal circumflex prior to doing off 2 posterolateral branches and the posterior descending artery.  The downstream circumflex prior to the actual stenosis has a somewhat hazy filling defect that would suggest possible residual thrombus.  The ostium of both PL2  and the PDA have focal possibly thrombotic stenoses that would suggest  the distal embolism.   RCA: Small caliber, nondominant vessel.  After reviewing the initial angiography, the culprit lesion was thought to be the 95% lesion in the distal LCx along with the Ostial LPDA & LPL3.  Preparation were made to proceed with PCI on these lesions.  Percutaneous Coronary Intervention:  Sheath exchanged for 6 Fr Guide: 6 Fr   XB 3.5 Guidewire: BMW (buddy); Luge   Predilation Balloon: Mini Trek 2.0 mm x 12 mm;   Max inflation 10 Atm x 30 Sec,  Stent: Xience Alpine DES 3.5 mm x 18 mm;   initially attempted to pass a 4.0 mm x 18 mm resolute DES, unable to pass despite the buddy wire; inverted to 3.5 millimeter by 18 mm Xience  Max Inflation: 20 Atm x 45 Sec,   Post-dilation Balloon: Earth Trek 4.0 mm x 8 mm;   Max Inflation: 16 Atm x 45 Sec x 2  Final Diameter: 4.0 mm   Lesion #2: Ostial LPDA 95% stenosis - reduced to <30%  Predilation Balloon: Mini Trek 2.0 mm x 12 mm;   6 Atm x 30 Sec - 1.7 mm  Post deployment angiography in multiple views, with and without guidewire in place revealed adequate stent deployment and lesion coverage.  There was no evidence of dissection or perforation.  MEDICATIONS:  Anesthesia:  Local Lidocaine 16 ml  Sedation:  2 mg IV Versed, 25 mcg IV fentanyl ;   Omnipaque Contrast: 245 ml  Anticoagulation:  Angiomax Bolus & drip  Anti-Platelet Agent:  Aggrestat bolus x 1; Brilinta 180 mg  PATIENT DISPOSITION:    The patient was transferred to the PACU holding area in a hemodynamicaly stable, chest pain free condition.  The patient tolerated the procedure well, and there were no complications.  EBL:   < 10 ml  The patient was stable before, during, and after the procedure.  POST-OPERATIVE DIAGNOSIS:    Severe multivessel disease with culprit lesion being 95% complex stenosis of the distal circumflex treated with DES PCI using a Xience Alpine stent with POBA of the ostial left PDA.  Residual moderate to severe disease in the  superior branch of OM 2 and mid LAD  Residual hazy appearance in the upstream circumflex suggestive of possible residual thrombus.  Severe dilated cardiomyopathy with global hypokinesis, and moderately elevated EDP. -- Likely out of proportion with the existing CAD  PLAN OF CARE:  Return to the TCU for post cath care.  Continue Angiomax at current rate until complete, then restart IV heparin 8 hours after sheath pull.  Dual antiplatelet therapy for a minimum of 1 year.  Check 2-D echocardiogram to better assess EF and pressures  The patient remained in complete heart block despite PCI. Electrophysiology to see in the a.m.  Once this is was made about plus or minus pacemaker, would recommend evaluation of the LAD and OM2a lesion with noninvasive stress testing as the patient did not have any anginal symptoms prior to presentation.   [name], M.D., M.S. [facility] [address] [place], [place]  [address]  [phone]  09/18/2013 3:19 PM

## 2013-09-18 NOTE — Progress Notes (Signed)
  Echocardiogram 2D Echocardiogram has been performed.  Basilia Jumbo 09/18/2013, 12:29 PM

## 2013-09-19 DIAGNOSIS — I498 Other specified cardiac arrhythmias: Secondary | ICD-10-CM | POA: Diagnosis not present

## 2013-09-19 DIAGNOSIS — I214 Non-ST elevation (NSTEMI) myocardial infarction: Secondary | ICD-10-CM | POA: Diagnosis not present

## 2013-09-19 LAB — BASIC METABOLIC PANEL
BUN: 48 mg/dL — ABNORMAL HIGH (ref 6–23)
CO2: 25 mEq/L (ref 19–32)
Calcium: 8.9 mg/dL (ref 8.4–10.5)
Chloride: 91 mEq/L — ABNORMAL LOW (ref 96–112)
Creatinine, Ser: 9.87 mg/dL — ABNORMAL HIGH (ref 0.50–1.35)
GFR calc Af Amer: 6 mL/min — ABNORMAL LOW (ref >90)
GFR calc non Af Amer: 5 mL/min — ABNORMAL LOW (ref >90)
Glucose, Bld: 145 mg/dL — ABNORMAL HIGH (ref 70–99)
Potassium: 3.9 mEq/L (ref 3.7–5.3)
Sodium: 135 mEq/L — ABNORMAL LOW (ref 137–147)

## 2013-09-19 LAB — CBC
HCT: 31.1 % — ABNORMAL LOW (ref 39.0–52.0)
HCT: 32.5 % — ABNORMAL LOW (ref 39.0–52.0)
Hemoglobin: 10.2 g/dL — ABNORMAL LOW (ref 13.0–17.0)
Hemoglobin: 10.4 g/dL — ABNORMAL LOW (ref 13.0–17.0)
MCH: 31 pg (ref 26.0–34.0)
MCH: 30.1 pg (ref 26.0–34.0)
MCHC: 32.8 g/dL (ref 30.0–36.0)
MCHC: 32 g/dL (ref 30.0–36.0)
MCV: 94.5 fL (ref 78.0–100.0)
MCV: 94.2 fL (ref 78.0–100.0)
Platelets: 38 10*3/uL — ABNORMAL LOW (ref 150–400)
Platelets: 46 10*3/uL — ABNORMAL LOW (ref 150–400)
RBC: 3.29 MIL/uL — ABNORMAL LOW (ref 4.22–5.81)
RBC: 3.45 MIL/uL — ABNORMAL LOW (ref 4.22–5.81)
RDW: 14.3 % (ref 11.5–15.5)
RDW: 14.5 % (ref 11.5–15.5)
WBC: 5.8 10*3/uL (ref 4.0–10.5)
WBC: 7.6 10*3/uL (ref 4.0–10.5)

## 2013-09-19 LAB — HEPATITIS B SURFACE ANTIGEN: Hepatitis B Surface Ag: NEGATIVE

## 2013-09-19 LAB — GLUCOSE, CAPILLARY
GLUCOSE-CAPILLARY: 167 mg/dL — AB (ref 70–99)
Glucose-Capillary: 138 mg/dL — ABNORMAL HIGH (ref 70–99)

## 2013-09-19 MED ORDER — SEVELAMER CARBONATE 800 MG PO TABS
2400.0000 mg | ORAL_TABLET | Freq: Three times a day (TID) | ORAL | Status: DC
  Administered 2013-09-19 – 2013-09-20 (×4): 2400 mg via ORAL
  Filled 2013-09-19 (×5): qty 3

## 2013-09-19 MED ORDER — LIDOCAINE HCL (PF) 1 % IJ SOLN: 5.0000 mL | INTRAMUSCULAR | Status: DC | PRN

## 2013-09-19 MED ORDER — DOXERCALCIFEROL 4 MCG/2ML IV SOLN
INTRAVENOUS | Status: AC
  Filled 2013-09-19: qty 4

## 2013-09-19 MED ORDER — SODIUM CHLORIDE 0.9 % IV SOLN: 100.0000 mL | INTRAVENOUS | Status: DC | PRN

## 2013-09-19 MED ORDER — HEPARIN SODIUM (PORCINE) 1000 UNIT/ML DIALYSIS: 1000.0000 [IU] | INTRAMUSCULAR | Status: DC | PRN

## 2013-09-19 MED ORDER — LIDOCAINE-PRILOCAINE 2.5-2.5 % EX CREA
1.0000 "application " | TOPICAL_CREAM | CUTANEOUS | Status: DC | PRN
  Filled 2013-09-19: qty 5

## 2013-09-19 MED ORDER — PENTAFLUOROPROP-TETRAFLUOROETH EX AERO: 1.0000 "application " | INHALATION_SPRAY | CUTANEOUS | Status: DC | PRN

## 2013-09-19 MED ORDER — DARBEPOETIN ALFA-POLYSORBATE 25 MCG/0.42ML IJ SOLN
25.0000 ug | INTRAMUSCULAR | Status: DC
  Filled 2013-09-19: qty 0.42

## 2013-09-19 MED ORDER — ALTEPLASE 2 MG IJ SOLR
2.0000 mg | Freq: Once | INTRAMUSCULAR | Status: DC | PRN
  Filled 2013-09-19: qty 2

## 2013-09-19 MED ORDER — NEPRO/CARBSTEADY PO LIQD
237.0000 mL | ORAL | Status: DC | PRN
  Filled 2013-09-19: qty 237

## 2013-09-19 MED ORDER — SODIUM CHLORIDE 0.9 % IV SOLN
100.0000 mL | INTRAVENOUS | Status: DC | PRN
Start: 2013-09-19 — End: 2013-09-19

## 2013-09-19 MED ORDER — DOXERCALCIFEROL 4 MCG/2ML IV SOLN
5.0000 ug | INTRAVENOUS | Status: DC
  Administered 2013-09-19: 5 ug via INTRAVENOUS
  Filled 2013-09-19: qty 4

## 2013-09-19 MED ORDER — RENA-VITE PO TABS
1.0000 | ORAL_TABLET | Freq: Every day | ORAL | Status: DC
  Administered 2013-09-19: 1 via ORAL
  Filled 2013-09-19 (×2): qty 1

## 2013-09-19 MED ORDER — SODIUM CHLORIDE 0.9 % IV SOLN
62.5000 mg | INTRAVENOUS | Status: DC
  Administered 2013-09-19: 62.5 mg via INTRAVENOUS
  Filled 2013-09-19 (×2): qty 5

## 2013-09-19 NOTE — Progress Notes (Signed)
Nursing: ACT was obtained at 2017, 2111,2204 with all results being greater than 150. Angiomax was reported to have been discontinued at 1815 per day shift RN Melissa. Paged and reported to Dr. Donneta Romberg the current patient assessment and lab results. Orders received to precede with the sheath pull. At 2220 atropine and Zoll placed in patient's room. Femoral pulse found in left groin. Suture cut and removed from around the 6 fr sheath and it was removed without difficulty. Pressure held started at 2221. No bleeding or swelling noted. Romana RN at bedside assessed distal pulses in left foot during the hold. Pressure was released at 2245. No bleeding or swelling noted. Pulse present 1+ in DP and PT sites of left foot. Pressure dressing applied to site. Instructed the patient about limitations with movement and HOB. Patient verbalized understanding. Heated pt's dinner tray and positioned him to be able to eat his meal while assessing the site. See doc flow sheets for additional documentation. During entire procedure patient reported no complaint of pain or discomfort.
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
## 2013-09-19 NOTE — Progress Notes (Signed)
[id number] Came to see pt to walk. Pt does not use prosthesis anymore and mainly stays in chair or wheelchair at home. Not appropriate for our services for mobility. May benefit from PT. Reviewed MI restrictions,stent and brilinta. Pt gets his meds delivered to him. Asked case manager to make sure his pharmacy has [facility]. Discussed with pt that brilinta is necessary due to stent. He stated his aide will help him remember to take his meds. Attempted to review NTG use but pt stated he never has had CP. Discussed with pt calling 911 if he ever has CP. He stated he has life line and he does not hesitate to call if needed. Pt stated he does not use salt or even have it in his house. Left MI booklet and stent card for pt. Will sign off. Graylon Good RN BSN 09/19/2013 11:57 AM .

## 2013-09-19 NOTE — Procedures (Signed)
Pt seen on HD Ap 130 Vp 190.  K3.9 so on 4K bath.  Tolerating HD well
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
## 2013-09-20 ENCOUNTER — Encounter (HOSPITAL_COMMUNITY): Payer: Self-pay | Admitting: Physician Assistant

## 2013-09-20 ENCOUNTER — Other Ambulatory Visit: Payer: Self-pay | Admitting: Physician Assistant

## 2013-09-20 DIAGNOSIS — I442 Atrioventricular block, complete: Secondary | ICD-10-CM

## 2013-09-20 DIAGNOSIS — I214 Non-ST elevation (NSTEMI) myocardial infarction: Secondary | ICD-10-CM

## 2013-09-20 LAB — CBC
HCT: 34 % — ABNORMAL LOW (ref 39.0–52.0)
HEMOGLOBIN: 11.4 g/dL — AB (ref 13.0–17.0)
MCH: 31 pg (ref 26.0–34.0)
MCHC: 33.5 g/dL (ref 30.0–36.0)
MCV: 92.4 fL (ref 78.0–100.0)
Platelets: 156 10*3/uL (ref 150–400)
RBC: 3.68 MIL/uL — ABNORMAL LOW (ref 4.22–5.81)
RDW: 14.7 % (ref 11.5–15.5)
WBC: 6.8 10*3/uL (ref 4.0–10.5)

## 2013-09-20 LAB — GLUCOSE, CAPILLARY
GLUCOSE-CAPILLARY: 99 mg/dL (ref 70–99)
GLUCOSE-CAPILLARY: 135 mg/dL — AB (ref 70–99)
GLUCOSE-CAPILLARY: 147 mg/dL — AB (ref 70–99)
Glucose-Capillary: 160 mg/dL — ABNORMAL HIGH (ref 70–99)

## 2013-09-20 MED ORDER — RENA-VITE PO TABS: 1.0000 | ORAL_TABLET | Freq: Every day | ORAL | Status: AC

## 2013-09-20 MED ORDER — TICAGRELOR 90 MG PO TABS: 90.0000 mg | ORAL_TABLET | Freq: Two times a day (BID) | ORAL | Status: DC

## 2013-09-20 MED ORDER — ASPIRIN 81 MG PO TBEC: 81.0000 mg | DELAYED_RELEASE_TABLET | Freq: Every day | ORAL | Status: AC

## 2013-09-20 MED ORDER — SEVELAMER CARBONATE 800 MG PO TABS: 2400.0000 mg | ORAL_TABLET | Freq: Three times a day (TID) | ORAL | Status: DC

## 2013-09-20 MED ORDER — NITROGLYCERIN 0.4 MG SL SUBL: 0.4000 mg | SUBLINGUAL_TABLET | SUBLINGUAL | Status: AC | PRN

## 2013-09-20 MED ORDER — ATORVASTATIN CALCIUM 80 MG PO TABS: 80.0000 mg | ORAL_TABLET | Freq: Every evening | ORAL | Status: DC

## 2013-09-20 MED FILL — Sodium Chloride IV Soln 0.9%: INTRAVENOUS | Qty: 50 | Status: AC

## 2013-09-20 NOTE — Progress Notes (Signed)
Discharge instructions reviewed with patient, questions answered, verbalized understanding.  Patient given Brilinta as ordered by PA.  Patient awaiting non-emergency transport home.
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
## 2013-09-20 NOTE — Progress Notes (Signed)
S:feels well.  No problems with HD yest O:BP 113/39  Pulse 73  Temp(Src) 98.7 F (37.1 C) (Oral)  Resp 32  Ht 5\' 10"  (1.778 m)  Wt 105.6 kg (232 lb 12.9 oz)  BMI 33.40 kg/m2  SpO2 100%  Intake/Output Summary (Last 24 hours) at 09/20/13 0819 Last data filed at 09/19/13 1806  Gross per 24 hour  Intake    288 ml  Output   3000 ml  Net  -2712 ml   Weight change: -1.4 kg (-3 lb 1.4 oz) MVE:HMCNO and alert CVS:RRR Resp:clear Abd:+ BS NTND Ext:Rt AKA  No edema on Lt NEURO:CNI Ox3 no asterixis   . aspirin EC  81 mg Oral Daily  . atorvastatin  80 mg Oral q1800  . cinacalcet  120 mg Oral Q breakfast  . darbepoetin (ARANESP) injection - DIALYSIS  25 mcg Intravenous Q Wed-HD  . dicyclomine  10 mg Oral TID AC & HS  . doxercalciferol  5 mcg Intravenous Q M,W,F-HD  . ferric gluconate (FERRLECIT/NULECIT) IV  62.5 mg Intravenous Weekly  . insulin aspart  0-15 Units Subcutaneous TID WC  . multivitamin  1 tablet Oral QHS  . sevelamer carbonate  2,400 mg Oral TID WC  . sodium chloride  3 mL Intravenous Q12H  . ticagrelor  90 mg Oral BID   No results found. BMET    Component Value Date/Time   NA 135* 09/19/2013 0650   K 3.9 09/19/2013 0650   CL 91* 09/19/2013 0650   CO2 25 09/19/2013 0650   GLUCOSE 145* 09/19/2013 0650   BUN 48* 09/19/2013 0650   CREATININE 9.87* 09/19/2013 0650   CREATININE 7.23* 09/04/2013 0905   CALCIUM 8.9 09/19/2013 0650   GFRNONAA 5* 09/19/2013 0650   GFRAA 6* 09/19/2013 0650   CBC    Component Value Date/Time   WBC 7.6 09/19/2013 1315   RBC 3.45* 09/19/2013 1315   HGB 10.4* 09/19/2013 1315   HCT 32.5* 09/19/2013 1315   PLT 46* 09/19/2013 1315   MCV 94.2 09/19/2013 1315   MCH 30.1 09/19/2013 1315   MCHC 32.0 09/19/2013 1315   RDW 14.5 09/19/2013 1315   LYMPHSABS 1.1 09/17/2013 1515   MONOABS 0.4 09/17/2013 1515   EOSABS 0.1 09/17/2013 1515   BASOSABS 0.0 09/17/2013 1515     Assessment: 1.NSTEMI with CHB SP pci cx and PDA 2. Sec HPTH 3. Anemia 4.  ESRD  Plan: 1.  Anticipate Dc today.  If not DC'd then will plan HD in AM.   Windy Kalata
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
## 2013-09-20 NOTE — Progress Notes (Signed)
PT Cancellation Note  Patient Details Name: Julian [name] MRN: [id number] DOB: January 23, 1946   Cancelled Treatment:    Reason Eval/Treat Not Completed: 1) Patient at procedure or test/unavailable (dialysis);          2) Medical issues which prohibited therapy (hypotension MAP=48)   Rexanne Mano 09/20/2013, 5:22 PM Pager [phone]

## 2013-09-21 ENCOUNTER — Telehealth: Payer: Self-pay | Admitting: *Deleted

## 2013-09-21 LAB — HEPARIN INDUCED THROMBOCYTOPENIA PNL
HEPARIN INDUCED PLT AB: NEGATIVE
PATIENT O. D.: 0.16
UFH HIGH DOSE UFH H: 0 %
UFH Low Dose 0.1 IU/mL: 0 % Release
UFH Low Dose 0.5 IU/mL: 0 % Release
UFH SRA Result: NEGATIVE

## 2013-09-21 NOTE — Telephone Encounter (Signed)
1 WEEK THN PATIENT DR [facility] NEEDS 30 DAY EVENT MONITOR FOR PATIENT DX COMPLETE HEART BLOCK. PT HAS APPT WITH KATHRYN 09/28/13 AT 3:10 IF YOU WILL LET HIM KNOW WHEN YOU CALL. THANKS/TMJ

## 2013-09-21 NOTE — Telephone Encounter (Signed)
Per TCM Dayna Dunn PA-C (not [facility]) requests 30 day event monitor for noted CHB before heart cath and intervention-(now in NSR with 1st degree AVB , called pts home [phone] no answer,no machine, cell phone [phone] "pre recorded message states number incorrect"  Called pt contact person [facility] Ewing [phone] and LM to call back    1200 hrs I called patient back at home number(he did give me new cell number [phone] ).He states he has no one to bring him into the office to have event monitor placed.He has hemodialysis M-W-F and uses [facility].He didn't know their name but stated they would not wait for him to have monitor placed on in our office. I asked him to make a drop off and pick up time for the [place] service then.He states he will try to reach his former home health aide Kiki Ewing and see if she can help him.He currently does not have a new health care aide.He then requested I call him back in 30 minutes   I have gave place a call to Janalyn Shy with [place] see if she could help [place]

## 2013-09-27 ENCOUNTER — Encounter: Payer: Self-pay | Admitting: *Deleted

## 2013-09-27 ENCOUNTER — Telehealth: Payer: Self-pay | Admitting: *Deleted

## 2013-09-27 NOTE — Telephone Encounter (Signed)
This encounter was created in error - please disregard.

## 2013-09-27 NOTE — Telephone Encounter (Signed)
Received call from [place], [facility] with [facility].   Reports that patient was seen in hospital for MI on 09/17/2013, and patient has just returned home.   States that patient medications at home and medication list from hospital do not match. Advised that patient should be taking medications prescribed by hospital until he has F/U with PCP.   Patient has appointment scheduled for 10/02/2013 at 12:30pm for 4 month F/U. Will review medications at this time.   MD to be made aware.

## 2013-09-28 ENCOUNTER — Ambulatory Visit: Payer: PRIVATE HEALTH INSURANCE | Admitting: Family Medicine

## 2013-09-28 ENCOUNTER — Ambulatory Visit (INDEPENDENT_AMBULATORY_CARE_PROVIDER_SITE_OTHER): Payer: PRIVATE HEALTH INSURANCE | Admitting: Adult Health

## 2013-09-28 ENCOUNTER — Encounter: Payer: Self-pay | Admitting: Adult Health

## 2013-09-28 ENCOUNTER — Telehealth: Payer: Self-pay | Admitting: *Deleted

## 2013-09-28 VITALS — BP 128/44 | HR 81 | Ht 70.0 in

## 2013-09-28 DIAGNOSIS — I10 Essential (primary) hypertension: Secondary | ICD-10-CM

## 2013-09-28 DIAGNOSIS — I442 Atrioventricular block, complete: Secondary | ICD-10-CM

## 2013-09-28 DIAGNOSIS — I214 Non-ST elevation (NSTEMI) myocardial infarction: Secondary | ICD-10-CM

## 2013-09-28 NOTE — Assessment & Plan Note (Signed)
He continues on aspirin and Plavix. He denies any, assess excessive bruising or pain at the catheter insertion site. He is medically compliant and has a home health aide which helps him with his medications. He has not missed any doses. He will continue on current medication regimen to include statin therapy. He will followup in our office in one month for further assessment and need for additional testing. At that time cardiac monitor we will have been completed, and hopefully read by that appointment.

## 2013-09-28 NOTE — Assessment & Plan Note (Signed)
Blood pressure is currently well-controlled. We will not make any changes in his medication regimen at this time. He has not been placed on ACE inhibitor in the setting of kidney disease.

## 2013-09-28 NOTE — Assessment & Plan Note (Signed)
This was noted during NSTEMI. He ended up having complete heart block after dialysis on 09/17/2013 , and symptomatic bradycardia. A cardiac monitor has been placed on him on Sep 22 2013 for 30 days. On discharge the patient remained in normal sinus rhythm with occasional second-degree AV block type I. Not placed on beta blocker in this setting. We will make further recommendations once cardiac monitor is been read.
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
## 2013-09-28 NOTE — Patient Instructions (Signed)
Your physician recommends that you schedule a follow-up appointment in: June with Jory Sims, NP  Your physician recommends that you continue on your current medications as directed. Please refer to the Current Medication list given to you today.

## 2013-09-28 NOTE — Telephone Encounter (Signed)
Late Entry: Pt came in on Sep 24, 2013 with home health aide to put on 30 day cardiac event monitor.  Pt and home health aide was given instructions on how to use monitor. Pt was put on monitor for 3rd degree heart block per discharge at the hospital on 09-20-13. Pt is to mail back the event monitor at [facility] on October 23, 2013.

## 2013-10-02 ENCOUNTER — Ambulatory Visit (INDEPENDENT_AMBULATORY_CARE_PROVIDER_SITE_OTHER): Payer: PRIVATE HEALTH INSURANCE | Admitting: Family Medicine

## 2013-10-02 ENCOUNTER — Encounter: Payer: Self-pay | Admitting: Family Medicine

## 2013-10-02 ENCOUNTER — Ambulatory Visit: Payer: PRIVATE HEALTH INSURANCE | Admitting: Family Medicine

## 2013-10-02 VITALS — BP 118/78 | HR 62 | Temp 97.9°F | Resp 16

## 2013-10-02 DIAGNOSIS — E119 Type 2 diabetes mellitus without complications: Secondary | ICD-10-CM

## 2013-10-02 DIAGNOSIS — I442 Atrioventricular block, complete: Secondary | ICD-10-CM

## 2013-10-02 DIAGNOSIS — I2510 Atherosclerotic heart disease of native coronary artery without angina pectoris: Secondary | ICD-10-CM | POA: Insufficient documentation

## 2013-10-02 DIAGNOSIS — N186 End stage renal disease: Secondary | ICD-10-CM

## 2013-10-02 DIAGNOSIS — I10 Essential (primary) hypertension: Secondary | ICD-10-CM

## 2013-10-02 DIAGNOSIS — Z992 Dependence on renal dialysis: Secondary | ICD-10-CM

## 2013-10-02 MED ORDER — OXYCODONE-ACETAMINOPHEN 10-325 MG PO TABS: 1.0000 | ORAL_TABLET | Freq: Two times a day (BID) | ORAL | Status: DC | PRN

## 2013-10-02 NOTE — Assessment & Plan Note (Signed)
Well controlled 

## 2013-10-02 NOTE — Assessment & Plan Note (Signed)
Reviewed cardiology note, on blood thinner, ASA, lipitor

## 2013-10-02 NOTE — Progress Notes (Signed)
Patient ID: Julian [name], male   DOB: Aug 22, 1945, 68 y.o.   MRN: [id number]   Subjective:    Patient ID: Julian [name], male    DOB: [birth year], 68 y.o.   MRN: [id number]  Patient presents for Hospital F/U  patient here for hospital followup. He was admitted secondary to complete heart block as well as NSTEMI He's been seen by cardiology had catheterization done has a drug-eluting stent and is currently on blood thinner. No abnormal bleeding. I did review his medications, some confusion regarding his renal vitamins he will see his nephrologist tomorrow will take all of his medications. He states that he feels good he has not had any pain or any difficulty since the hospitalization.  He does request diabetic shoe for his left foot as well as the new seat cushion for his motorized wheelchair.      Review Of Systems:  GEN- denies fatigue, fever, weight loss,weakness, recent illness HEENT- denies eye drainage, change in vision, nasal discharge, CVS- denies chest pain, palpitations RESP- denies SOB, cough, wheeze ABD- denies N/V, change in stools, abd pain GU- denies dysuria, hematuria, dribbling, incontinence MSK- +joint pain, muscle aches, injury Neuro- denies headache, dizziness, syncope, seizure activity       Objective:    BP 118/78  Pulse 62  Temp(Src) 97.9 F (36.6 C) (Oral)  Resp 16 GEN- NAD, alert and oriented x3 HEENT- PERRL, EOMI, non injected sclera, pink conjunctiva, MMM, oropharynx clear Neck- Supple, CVS- RRR, no murmur RESP-CTAB EXT- + chronic venous statis/ no  edema LLE Skin - intact,no open lesions on foot MSK- missing all digits except great toe on left foot, callus formation soles Pulses- Radial 2+, DP 1+ LLE,      Assessment & Plan:      Problem List Items Addressed This Visit   Type II or unspecified type diabetes mellitus without mention of complication, not stated as uncontrolled   End-stage renal disease on hemodialysis - Primary   Complete heart block   Relevant Medications      labetalol (NORMODYNE) 200 MG tablet   CAD (coronary artery disease)   Relevant Medications      labetalol (NORMODYNE) 200 MG tablet      Note: This dictation was prepared with Dragon dictation along with smaller phrase technology. Any transcriptional errors that result from this process are unintentional.

## 2013-10-02 NOTE — Patient Instructions (Signed)
Needs diabetic shoes with straps to be done  Pain medication refilled  New cushion for wheelchair Get your vitamins checked by the kidney doctor F/U previous

## 2013-10-02 NOTE — Assessment & Plan Note (Signed)
Diet controlled A1C 7%

## 2013-10-04 ENCOUNTER — Telehealth: Payer: Self-pay | Admitting: *Deleted

## 2013-10-04 NOTE — Telephone Encounter (Signed)
Received call from [facility], [facility] SN.   Requested to extend SN visits x2 for medication management.   Verbal order given.  MD to be made aware.

## 2013-10-04 NOTE — Telephone Encounter (Signed)
Agree with above 

## 2013-10-07 ENCOUNTER — Encounter (HOSPITAL_COMMUNITY): Payer: Self-pay | Admitting: Emergency Medicine

## 2013-10-07 ENCOUNTER — Emergency Department (HOSPITAL_COMMUNITY)
Admission: EM | Admit: 2013-10-07 | Discharge: 2013-10-07 | Disposition: A | Discharge status: Home / Self Care | Payer: PRIVATE HEALTH INSURANCE | Attending: Emergency Medicine | Admitting: Emergency Medicine

## 2013-10-07 DIAGNOSIS — E119 Type 2 diabetes mellitus without complications: Secondary | ICD-10-CM | POA: Insufficient documentation

## 2013-10-07 DIAGNOSIS — Z862 Personal history of diseases of the blood and blood-forming organs and certain disorders involving the immune mechanism: Secondary | ICD-10-CM | POA: Insufficient documentation

## 2013-10-07 DIAGNOSIS — E1139 Type 2 diabetes mellitus with other diabetic ophthalmic complication: Secondary | ICD-10-CM | POA: Insufficient documentation

## 2013-10-07 DIAGNOSIS — Z951 Presence of aortocoronary bypass graft: Secondary | ICD-10-CM | POA: Insufficient documentation

## 2013-10-07 DIAGNOSIS — E11319 Type 2 diabetes mellitus with unspecified diabetic retinopathy without macular edema: Secondary | ICD-10-CM | POA: Insufficient documentation

## 2013-10-07 DIAGNOSIS — N390 Urinary tract infection, site not specified: Secondary | ICD-10-CM | POA: Diagnosis not present

## 2013-10-07 DIAGNOSIS — Z79899 Other long term (current) drug therapy: Secondary | ICD-10-CM | POA: Diagnosis not present

## 2013-10-07 DIAGNOSIS — N186 End stage renal disease: Secondary | ICD-10-CM | POA: Diagnosis not present

## 2013-10-07 DIAGNOSIS — Z8619 Personal history of other infectious and parasitic diseases: Secondary | ICD-10-CM | POA: Diagnosis not present

## 2013-10-07 DIAGNOSIS — I2510 Atherosclerotic heart disease of native coronary artery without angina pectoris: Secondary | ICD-10-CM | POA: Insufficient documentation

## 2013-10-07 DIAGNOSIS — G8929 Other chronic pain: Secondary | ICD-10-CM | POA: Insufficient documentation

## 2013-10-07 DIAGNOSIS — E669 Obesity, unspecified: Secondary | ICD-10-CM | POA: Diagnosis not present

## 2013-10-07 DIAGNOSIS — Z7902 Long term (current) use of antithrombotics/antiplatelets: Secondary | ICD-10-CM | POA: Insufficient documentation

## 2013-10-07 DIAGNOSIS — Z7982 Long term (current) use of aspirin: Secondary | ICD-10-CM | POA: Diagnosis not present

## 2013-10-07 DIAGNOSIS — I120 Hypertensive chronic kidney disease with stage 5 chronic kidney disease or end stage renal disease: Secondary | ICD-10-CM | POA: Insufficient documentation

## 2013-10-07 DIAGNOSIS — R300 Dysuria: Secondary | ICD-10-CM | POA: Diagnosis present

## 2013-10-07 LAB — URINALYSIS, ROUTINE W REFLEX MICROSCOPIC
BILIRUBIN URINE: NEGATIVE
Glucose, UA: NEGATIVE mg/dL
Ketones, ur: NEGATIVE mg/dL
NITRITE: NEGATIVE
Protein, ur: 100 mg/dL — AB
SPECIFIC GRAVITY, URINE: 1.015 (ref 1.005–1.030)
UROBILINOGEN UA: 0.2 mg/dL (ref 0.0–1.0)
pH: 7 (ref 5.0–8.0)

## 2013-10-07 LAB — URINE MICROSCOPIC-ADD ON

## 2013-10-07 MED ORDER — CEPHALEXIN 500 MG PO CAPS: 500.0000 mg | ORAL_CAPSULE | Freq: Four times a day (QID) | ORAL | Status: DC

## 2013-10-07 MED ORDER — LIDOCAINE HCL (PF) 1 % IJ SOLN
INTRAMUSCULAR | Status: AC
  Administered 2013-10-07: 5 mL
  Filled 2013-10-07: qty 5

## 2013-10-07 MED ORDER — CEFTRIAXONE SODIUM 1 G IJ SOLR
1.0000 g | Freq: Once | INTRAMUSCULAR | Status: AC
  Administered 2013-10-07: 1 g via INTRAMUSCULAR
  Filled 2013-10-07: qty 10
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
## 2013-10-07 NOTE — Discharge Instructions (Signed)
Follow up with your md next week. °

## 2013-10-07 NOTE — ED Notes (Addendum)
Pt c/o burning with  urination for the past three days, ;pt is wearing a heart monitor that was placed last week and will stay on for a month for dizziness,

## 2013-10-07 NOTE — ED Notes (Signed)
PT c/o painful urination x2 days.

## 2013-10-25 ENCOUNTER — Other Ambulatory Visit: Payer: Self-pay | Admitting: *Deleted

## 2013-10-25 DIAGNOSIS — I442 Atrioventricular block, complete: Secondary | ICD-10-CM

## 2013-10-25 DIAGNOSIS — R42 Dizziness and giddiness: Secondary | ICD-10-CM

## 2013-11-02 ENCOUNTER — Telehealth: Payer: Self-pay | Admitting: Family Medicine

## 2013-11-02 MED ORDER — OXYCODONE-ACETAMINOPHEN 10-325 MG PO TABS: 1.0000 | ORAL_TABLET | Freq: Two times a day (BID) | ORAL | Status: DC | PRN

## 2013-11-02 NOTE — Telephone Encounter (Signed)
Ok to refill??  Last office visit/ refill 10/02/2013.

## 2013-11-02 NOTE — Telephone Encounter (Signed)
Okay 

## 2013-11-02 NOTE — Telephone Encounter (Signed)
[phone]  Pt states he called the other day and someone told him that he could get [place] medication refilled on the 26th but there was no message put in. I tried to explain to the pt we had to send a message back to dr to get the  Medication approved and he was not very happy and wanted to argue with me .   Pt is needing a refill on oxyCODONE-acetaminophen (PERCOCET) 10-325 MG per tablet

## 2013-11-02 NOTE — Telephone Encounter (Signed)
Prescription printed and patient made aware to come to office to pick up.  

## 2013-11-06 ENCOUNTER — Ambulatory Visit (INDEPENDENT_AMBULATORY_CARE_PROVIDER_SITE_OTHER): Payer: PRIVATE HEALTH INSURANCE | Admitting: Adult Health

## 2013-11-06 ENCOUNTER — Encounter: Payer: Self-pay | Admitting: Adult Health

## 2013-11-06 VITALS — BP 112/58 | HR 81 | Ht 70.0 in | Wt 232.0 lb

## 2013-11-06 DIAGNOSIS — I2510 Atherosclerotic heart disease of native coronary artery without angina pectoris: Secondary | ICD-10-CM

## 2013-11-06 DIAGNOSIS — N186 End stage renal disease: Secondary | ICD-10-CM

## 2013-11-06 DIAGNOSIS — I2583 Coronary atherosclerosis due to lipid rich plaque: Principal | ICD-10-CM

## 2013-11-06 DIAGNOSIS — I10 Essential (primary) hypertension: Secondary | ICD-10-CM

## 2013-11-06 DIAGNOSIS — I442 Atrioventricular block, complete: Secondary | ICD-10-CM

## 2013-11-06 DIAGNOSIS — Z992 Dependence on renal dialysis: Secondary | ICD-10-CM
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
## 2013-11-06 NOTE — Assessment & Plan Note (Signed)
Excellent control currently. No changes in medications.

## 2013-11-06 NOTE — Assessment & Plan Note (Signed)
He is without complaints No bleeding or dizziness. He is compliant with his medications. He has an CNA which helps him with bathing and other ADls.  I have tried to educate him simply concerning taking his medications daily and watching for bleeding He verbalizes understanding. As he is asymptomatic, I will not place a cardiac monitor on this patient at this time.  He will follow up with Dr. Harl Bowie in 4 months. Will have to repeat echo soon after for re-evaluation of LV fx.   He will continue DAPT ASA, Statin. He is not on BB at this time due to bradycardia. Will need to reconsider this on follow up.

## 2013-11-06 NOTE — Assessment & Plan Note (Signed)
Resolved with PCI to Distal Cx and POBA of ostial left sided PDA.

## 2013-11-06 NOTE — Assessment & Plan Note (Signed)
Volume is controlled by dialysis. Labs are completed via Nephrology.

## 2013-11-06 NOTE — Patient Instructions (Signed)
Your physician recommends that you schedule a follow-up appointment in: 4 months  Your physician recommends that you continue on your current medications as directed. Please refer to the Current Medication list given to you today.  Thank you for choosing [facility]!    

## 2013-11-06 NOTE — Progress Notes (Deleted)
Name: Julian [name]    DOB: [birth year]  Age: 68 y.o.  MR#: [id number]       PCP:  Vic Blackbird, MD      Insurance: Payor: [employer] MEDICARE / Plan: Cottie Banda / Product Type: *No Product type* /   CC:    Chief Complaint  Patient presents with  . Coronary Artery Disease    Status post PCI with drug-eluting stent to distal circumflex andPOBAostial left sided PDA  . Cardiomyopathy    VS Filed Vitals:   11/06/13 1245  BP: 112/58  Pulse: 81  Height: 5\' 10"  (1.778 m)  Weight: 232 lb (105.235 kg)    Weights Current Weight  11/06/13 232 lb (105.235 kg)  10/07/13 231 lb 7.7 oz (105 kg)  09/19/13 232 lb 12.9 oz (105.6 kg)    Blood Pressure  BP Readings from Last 3 Encounters:  11/06/13 112/58  10/07/13 133/54  10/02/13 118/78     Admit date:  (Not on file) Last encounter with RMR:  09/28/2013   Allergy Review of patient's allergies indicates no known allergies.  Current Outpatient Prescriptions  Medication Sig Dispense Refill  . aspirin EC 81 MG EC tablet Take 1 tablet (81 mg total) by mouth daily.      Marland Kitchen atorvastatin (LIPITOR) 80 MG tablet Take 1 tablet (80 mg total) by mouth every evening.  30 tablet  6  . dicyclomine (BENTYL) 10 MG capsule Take 10 mg by mouth 4 (four) times daily -  before meals and at bedtime.      . diphenoxylate-atropine (LOMOTIL) 2.5-0.025 MG per tablet Take 1 tablet by mouth 3 (three) times daily as needed. For Diarrhea      . methocarbamol (ROBAXIN) 500 MG tablet Take 500 mg by mouth 3 (three) times daily.      . multivitamin (RENA-VIT) TABS tablet Take 1 tablet by mouth at bedtime. Rena-Vit.  30 tablet  0  . nitroGLYCERIN (NITROSTAT) 0.4 MG SL tablet Place 1 tablet (0.4 mg total) under the tongue every 5 (five) minutes as needed for chest pain (up to 3 doses).  25 tablet  3  . oxyCODONE-acetaminophen (PERCOCET) 10-325 MG per tablet Take 1 tablet by mouth 2 (two) times daily as needed for pain. Do not fill before 08/30/2013.  60 tablet  0  .  sevelamer carbonate (RENVELA) 800 MG tablet Take 3 tablets (2,400 mg total) by mouth 3 (three) times daily with meals.  270 tablet  0  . ticagrelor (BRILINTA) 90 MG TABS tablet Take 1 tablet (90 mg total) by mouth 2 (two) times daily.  60 tablet  11  . cinacalcet (SENSIPAR) 60 MG tablet Take 120 mg by mouth daily. Takes 2 tablets at lunch       No current facility-administered medications for this visit.    Discontinued Meds:    Medications Discontinued During This Encounter  Medication Reason  . cephALEXin (KEFLEX) 500 MG capsule Error  . labetalol (NORMODYNE) 200 MG tablet Error    Patient Active Problem List   Diagnosis Date Noted  . CAD (coronary artery disease) 10/02/2013  . Bradycardia 09/17/2013  . Complete heart block 09/17/2013  . NSTEMI (non-ST elevated myocardial infarction) 09/17/2013  . Foot ulcer, left 06/05/2013  . C. difficile diarrhea 01/05/2013  . Decreased visual acuity 06/20/2012  . Status post unilateral above knee amputation 06/20/2012  . Shoulder pain 04/02/2012  . Vertigo 01/26/2012  . Back pain 01/26/2012  . End stage renal disease 12/07/2011  . Type  II or unspecified type diabetes mellitus without mention of complication, not stated as uncontrolled 10/28/2011  . Essential hypertension, benign 10/28/2011  . Obesity, unspecified 10/28/2011  . Steal syndrome as complication of dialysis access 09/07/2011  . Adenomatous polyps 08/28/2011  . Diarrhea 08/27/2011  . Difficulty in walking 04/06/2011  . End-stage renal disease on hemodialysis 12/04/2010    LABS    Component Value Date/Time   NA 135* 09/19/2013 0650   NA 136* 09/18/2013 0955   NA 136* 09/18/2013 0250   K 3.9 09/19/2013 0650   K 4.0 09/18/2013 0955   K 3.6* 09/18/2013 0250   CL 91* 09/19/2013 0650   CL 92* 09/18/2013 0955   CL 94* 09/18/2013 0250   CO2 25 09/19/2013 0650   CO2 24 09/18/2013 0955   CO2 26 09/18/2013 0250   GLUCOSE 145* 09/19/2013 0650   GLUCOSE 138* 09/18/2013 0955   GLUCOSE  141* 09/18/2013 0250   BUN 48* 09/19/2013 0650   BUN 34* 09/18/2013 0955   BUN 32* 09/18/2013 0250   CREATININE 9.87* 09/19/2013 0650   CREATININE 8.00* 09/18/2013 0955   CREATININE 7.27* 09/18/2013 0250   CREATININE 7.23* 09/04/2013 0905   CREATININE 7.80* 01/05/2013 1442   CREATININE 7.54* 06/20/2012 1042   CALCIUM 8.9 09/19/2013 0650   CALCIUM 10.0 09/18/2013 0955   CALCIUM 10.1 09/18/2013 0250   GFRNONAA 5* 09/19/2013 0650   GFRNONAA 6* 09/18/2013 0955   GFRNONAA 7* 09/18/2013 0250   GFRAA 6* 09/19/2013 0650   GFRAA 7* 09/18/2013 0955   GFRAA 8* 09/18/2013 0250   CMP     Component Value Date/Time   NA 135* 09/19/2013 0650   K 3.9 09/19/2013 0650   CL 91* 09/19/2013 0650   CO2 25 09/19/2013 0650   GLUCOSE 145* 09/19/2013 0650   BUN 48* 09/19/2013 0650   CREATININE 9.87* 09/19/2013 0650   CREATININE 7.23* 09/04/2013 0905   CALCIUM 8.9 09/19/2013 0650   PROT 6.8 09/18/2013 0250   ALBUMIN 2.8* 09/18/2013 0250   AST 23 09/18/2013 0250   ALT 39 09/18/2013 0250   ALKPHOS 142* 09/18/2013 0250   BILITOT 0.4 09/18/2013 0250   GFRNONAA 5* 09/19/2013 0650   GFRAA 6* 09/19/2013 0650       Component Value Date/Time   WBC 6.8 09/20/2013 1025   WBC 7.6 09/19/2013 1315   WBC 5.8 09/19/2013 0650   HGB 11.4* 09/20/2013 1025   HGB 10.4* 09/19/2013 1315   HGB 10.2* 09/19/2013 0650   HCT 34.0* 09/20/2013 1025   HCT 32.5* 09/19/2013 1315   HCT 31.1* 09/19/2013 0650   MCV 92.4 09/20/2013 1025   MCV 94.2 09/19/2013 1315   MCV 94.5 09/19/2013 0650    Lipid Panel     Component Value Date/Time   CHOL 96 09/18/2013 0250   TRIG 59 09/18/2013 0250   HDL 31* 09/18/2013 0250   CHOLHDL 3.1 09/18/2013 0250   VLDL 12 09/18/2013 0250   LDLCALC 53 09/18/2013 0250    ABG    Component Value Date/Time   TCO2 23 01/26/2013 0820     Lab Results  Component Value Date   TSH 0.515 09/18/2013   BNP (last 3 results) No results found for this basename: PROBNP,  in the last 8760 hours Cardiac Panel (last 3 results) No results found for  this basename: CKTOTAL, CKMB, TROPONINI, RELINDX,  in the last 72 hours  Iron/TIBC/Ferritin No results found for this basename: iron, tibc, ferritin     EKG Orders placed in  visit on 10/25/13  . CARDIAC EVENT MONITOR     Prior Assessment and Plan Problem List as of 11/06/2013     Cardiovascular and Mediastinum   Essential hypertension, benign   Last Assessment & Plan   10/02/2013 Office Visit Written 10/02/2013  5:59 PM by Alycia Rossetti, MD     Well controlled    Complete heart block   Last Assessment & Plan   09/28/2013 Office Visit Written 09/28/2013  4:30 PM by Lendon Colonel, NP     This was noted during NSTEMI. He ended up having complete heart block after dialysis on 09/17/2013 , and symptomatic bradycardia. A cardiac monitor has been placed on him on Sep 22 2013 for 30 days. On discharge the patient remained in normal sinus rhythm with occasional second-degree AV block type I. Not placed on beta blocker in this setting. We will make further recommendations once cardiac monitor is been read.     NSTEMI (non-ST elevated myocardial infarction)   Last Assessment & Plan   09/28/2013 Office Visit Written 09/28/2013  4:27 PM by Lendon Colonel, NP     He continues on aspirin and Plavix. He denies any, assess excessive bruising or pain at the catheter insertion site. He is medically compliant and has a home health aide which helps him with his medications. He has not missed any doses. He will continue on current medication regimen to include statin therapy. He will followup in our office in one month for further assessment and need for additional testing. At that time cardiac monitor we will have been completed, and hopefully read by that appointment.    CAD (coronary artery disease)   Last Assessment & Plan   10/02/2013 Office Visit Written 10/02/2013  5:59 PM by Alycia Rossetti, MD     Reviewed cardiology note, on blood thinner, ASA, lipitor      Digestive   Adenomatous polyps    Last Assessment & Plan   01/03/2013 Office Visit Written 01/05/2013  9:17 PM by Orvil Feil, NP     Surveillance due again in March 2019.     C. difficile diarrhea   Last Assessment & Plan   01/29/2013 Office Visit Written 01/29/2013  2:04 PM by Orvil Feil, NP     Clinically improved after second round of Flagyl Aug 2014 (empirically) and Bentyl for supportive measures. If placed on abx, would recommend empiric Flagyl in conjunction. Return in 6 months.       Endocrine   Type II or unspecified type diabetes mellitus without mention of complication, not stated as uncontrolled   Last Assessment & Plan   10/02/2013 Office Visit Written 10/02/2013  5:59 PM by Alycia Rossetti, MD     Diet controlled A1C 7%      Genitourinary   End-stage renal disease on hemodialysis   Last Assessment & Plan   02/06/2013 Office Visit Written 02/06/2013  9:28 PM by Alycia Rossetti, MD     Unchanged, doing well    End stage renal disease     Other   Difficulty in walking   Last Assessment & Plan   10/28/2011 Office Visit Written 10/28/2011 12:38 PM by Alycia Rossetti, MD     Resides in wheelchair secondary to above the knee amputation on right side. He has motorized wheelchair    Diarrhea   Last Assessment & Plan   07/17/2013 Office Visit Written 07/17/2013 10:12 AM by Mahala Menghini, PA-C  Overall doing well. He is using Bentyl and Lomotil as needed for occasional loose stool. Really denies any significant diarrhea since his last seen. We discussed utilizing dicyclomine initially when he felt like he needed it. Would hold on Lomotil unless dicyclomine is not working. If he develops diarrhea that lasts for 2-3 days he will let us know we would recheck C. difficile at that time. Otherwise see him back in one year.    Steal syndrome as complication of dialysis access   Obesity, unspecified   Last Assessment & Plan   08/01/2012 Office Visit Written 08/02/2012  9:33 AM by Alycia Rossetti, MD     Unchanged,  wheelchair bound    Vertigo   Last Assessment & Plan   01/24/2012 Office Visit Written 01/26/2012  4:00 PM by Alycia Rossetti, MD     Positional vertigo, improved with meclizine    Back pain   Last Assessment & Plan   04/03/2013 Office Visit Written 04/03/2013  5:24 PM by Alycia Rossetti, MD     Back pain status post fall one month ago. He has known chronic history of back pain. Fortunately he is in his chair most of the day. He also gets and spasm. I will try him on Robaxin short-term. I will also continue his Percocet at the current dose     Shoulder pain   Last Assessment & Plan   03/31/2012 Office Visit Written 04/02/2012 12:37 AM by Alycia Rossetti, MD     No acute injury, no swelling graft functioned well this morning. Will monitor for now    Decreased visual acuity   Last Assessment & Plan   06/20/2012 Office Visit Written 06/20/2012 12:09 PM by Alycia Rossetti, MD     Followed by [facility], recent visit, obtain note    Status post unilateral above knee amputation   Last Assessment & Plan   06/05/2013 Office Visit Written 06/05/2013  7:54 PM by Alycia Rossetti, MD     unchanged    Foot ulcer, left   Last Assessment & Plan   06/05/2013 Office Visit Written 06/05/2013  7:56 PM by Alycia Rossetti, MD     I'm concerned about an early foot ulcer on his great toe. All refer him to podiatry for foot care. The wound was dressed at the bedside    Bradycardia       Imaging: No results found.

## 2013-11-27 ENCOUNTER — Encounter: Payer: Self-pay | Admitting: Family Medicine

## 2013-11-27 ENCOUNTER — Ambulatory Visit (INDEPENDENT_AMBULATORY_CARE_PROVIDER_SITE_OTHER): Payer: PRIVATE HEALTH INSURANCE | Admitting: Family Medicine

## 2013-11-27 VITALS — BP 130/78 | HR 70 | Temp 98.0°F | Resp 14

## 2013-11-27 DIAGNOSIS — R5381 Other malaise: Secondary | ICD-10-CM

## 2013-11-27 DIAGNOSIS — R300 Dysuria: Secondary | ICD-10-CM

## 2013-11-27 DIAGNOSIS — R5383 Other fatigue: Principal | ICD-10-CM

## 2013-11-27 MED ORDER — OXYCODONE-ACETAMINOPHEN 10-325 MG PO TABS: 1.0000 | ORAL_TABLET | Freq: Two times a day (BID) | ORAL | Status: DC | PRN

## 2013-11-27 NOTE — Patient Instructions (Signed)
Continue current medications Stay hydrated Kidney doctor will check your iron level Bring the urine sample back F/U in August

## 2013-11-27 NOTE — Progress Notes (Signed)
Patient ID: Julian [name], male   DOB: 1945/08/05, 68 y.o.   MRN: [id number]   Subjective:    Patient ID: Julian [name], male    DOB: 10-29-1945, 68 y.o.   MRN: [id number]  Patient presents for Increased Fatigue and Possible UTI  patient presents with possible UTI. States a week ago he had some pressure when he was urinating felt similar to his previous UTI as well as some burning. He did start drinking some cranberry juice and it has improved. He also states the past couple weeks he's been more tired than normal. He's not had any chest pain shortness of breath no headache no falls he is tolerating dialysis without any difficulties. His aide  stated that he might need to have his iron checked which is his artery been done by his nephrologist He also has paperwork for a new P[facility] provider- [facility]    Review Of Systems:  GEN- + fatigue, fever, weight loss,weakness, recent illness HEENT- denies eye drainage, change in vision, nasal discharge, CVS- denies chest pain, palpitations RESP- denies SOB, cough, wheeze ABD- denies N/V, change in stools, abd pain GU- + dysuria, hematuria, dribbling, incontinence MSK- denies joint pain, muscle aches, injury Neuro- denies headache, dizziness, syncope, seizure activity       Objective:    BP 130/78  Pulse 70  Temp(Src) 98 F (36.7 C) (Oral)  Resp 14 GEN- NAD, alert and oriented x3 HEENT- PERRL, EOMI, non injected sclera, pink conjunctiva, MMM, oropharynx clear Neck- Supple, CVS- RRR, no murmur RESP-CTAB ABD-NABS,soft,NT,ND, no CVA tenderness EXT- + chronic venous statis/ no  edema LLE Pulses- Radial 2+, DP 1+ LLE,       Assessment & Plan:      Problem List Items Addressed This Visit   Other malaise and fatigue - Primary - This may be multifactorial his exam is benign he appears to be at his baseline today. His diabetes is under good control without any medications. His hemoglobin   Dysuria -He is unable to make urine at this time. He  typically has urine for same the morning he will provide [place] with a sample this week      Note: This dictation was prepared with Dragon dictation along with smaller phrase technology. Any transcriptional errors that result from this process are unintentional.

## 2013-12-04 ENCOUNTER — Emergency Department (HOSPITAL_COMMUNITY)
Admission: EM | Admit: 2013-12-04 | Discharge: 2013-12-04 | Disposition: A | Discharge status: Home / Self Care | Payer: PRIVATE HEALTH INSURANCE | Attending: Emergency Medicine | Admitting: Emergency Medicine

## 2013-12-04 ENCOUNTER — Encounter (HOSPITAL_COMMUNITY): Payer: Self-pay | Admitting: Emergency Medicine

## 2013-12-04 DIAGNOSIS — Z7982 Long term (current) use of aspirin: Secondary | ICD-10-CM | POA: Insufficient documentation

## 2013-12-04 DIAGNOSIS — Z951 Presence of aortocoronary bypass graft: Secondary | ICD-10-CM | POA: Diagnosis not present

## 2013-12-04 DIAGNOSIS — N186 End stage renal disease: Secondary | ICD-10-CM | POA: Diagnosis not present

## 2013-12-04 DIAGNOSIS — I120 Hypertensive chronic kidney disease with stage 5 chronic kidney disease or end stage renal disease: Secondary | ICD-10-CM | POA: Insufficient documentation

## 2013-12-04 DIAGNOSIS — I2510 Atherosclerotic heart disease of native coronary artery without angina pectoris: Secondary | ICD-10-CM | POA: Insufficient documentation

## 2013-12-04 DIAGNOSIS — N390 Urinary tract infection, site not specified: Secondary | ICD-10-CM

## 2013-12-04 DIAGNOSIS — G8929 Other chronic pain: Secondary | ICD-10-CM | POA: Diagnosis not present

## 2013-12-04 DIAGNOSIS — Z862 Personal history of diseases of the blood and blood-forming organs and certain disorders involving the immune mechanism: Secondary | ICD-10-CM | POA: Diagnosis not present

## 2013-12-04 DIAGNOSIS — Z79899 Other long term (current) drug therapy: Secondary | ICD-10-CM | POA: Diagnosis not present

## 2013-12-04 DIAGNOSIS — I739 Peripheral vascular disease, unspecified: Secondary | ICD-10-CM | POA: Diagnosis not present

## 2013-12-04 DIAGNOSIS — Z9889 Other specified postprocedural states: Secondary | ICD-10-CM | POA: Insufficient documentation

## 2013-12-04 DIAGNOSIS — Z992 Dependence on renal dialysis: Secondary | ICD-10-CM | POA: Insufficient documentation

## 2013-12-04 DIAGNOSIS — E1139 Type 2 diabetes mellitus with other diabetic ophthalmic complication: Secondary | ICD-10-CM | POA: Insufficient documentation

## 2013-12-04 DIAGNOSIS — E11319 Type 2 diabetes mellitus with unspecified diabetic retinopathy without macular edema: Secondary | ICD-10-CM | POA: Diagnosis not present

## 2013-12-04 DIAGNOSIS — E669 Obesity, unspecified: Secondary | ICD-10-CM | POA: Diagnosis not present

## 2013-12-04 DIAGNOSIS — R300 Dysuria: Secondary | ICD-10-CM | POA: Diagnosis present

## 2013-12-04 LAB — URINE MICROSCOPIC-ADD ON

## 2013-12-04 LAB — URINALYSIS, ROUTINE W REFLEX MICROSCOPIC
Bilirubin Urine: NEGATIVE
Glucose, UA: NEGATIVE mg/dL
Ketones, ur: NEGATIVE mg/dL
Nitrite: NEGATIVE
Protein, ur: >300 mg/dL — AB
Specific Gravity, Urine: 1.015 (ref 1.005–1.030)
Urobilinogen, UA: 0.2 mg/dL (ref 0.0–1.0)
pH: 7 (ref 5.0–8.0)

## 2013-12-04 MED ORDER — CEPHALEXIN 500 MG PO CAPS
500.0000 mg | ORAL_CAPSULE | Freq: Once | ORAL | Status: AC
  Administered 2013-12-04: 500 mg via ORAL
  Filled 2013-12-04: qty 1

## 2013-12-04 MED ORDER — CEPHALEXIN 500 MG PO CAPS: 500.0000 mg | ORAL_CAPSULE | Freq: Four times a day (QID) | ORAL | Status: DC

## 2013-12-04 NOTE — ED Notes (Signed)
Burning with urination starting 3 days ago.
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
## 2013-12-06 LAB — URINE CULTURE: Colony Count: 6000

## 2013-12-07 ENCOUNTER — Telehealth (HOSPITAL_BASED_OUTPATIENT_CLINIC_OR_DEPARTMENT_OTHER): Payer: Self-pay | Admitting: Emergency Medicine

## 2013-12-07 NOTE — Telephone Encounter (Signed)
Post ED Visit - Positive Culture Follow-up  Culture report reviewed by antimicrobial stewardship pharmacist: []  Wes Girard, Pharm.D., BCPS [x]  Heide Guile, Pharm.D., BCPS []  Alycia Rossetti, Pharm.D., BCPS []  Wittmann, Pharm.D., BCPS, AAHIVP []  Legrand Como, Pharm.D., BCPS, AAHIVP  Positive urine culture Treated with Keflex, organism sensitive to the same and no further patient follow-up is required at this time.  Matthew Saras, Rex Kras 12/07/2013, 11:44 AM

## 2013-12-16 ENCOUNTER — Encounter (HOSPITAL_COMMUNITY): Payer: Self-pay | Admitting: Emergency Medicine

## 2013-12-16 ENCOUNTER — Emergency Department (HOSPITAL_COMMUNITY)
Admission: EM | Admit: 2013-12-16 | Discharge: 2013-12-16 | Disposition: A | Discharge status: Home / Self Care | Payer: PRIVATE HEALTH INSURANCE | Attending: Emergency Medicine | Admitting: Emergency Medicine

## 2013-12-16 DIAGNOSIS — S78119A Complete traumatic amputation at level between unspecified hip and knee, initial encounter: Secondary | ICD-10-CM | POA: Diagnosis not present

## 2013-12-16 DIAGNOSIS — E1139 Type 2 diabetes mellitus with other diabetic ophthalmic complication: Secondary | ICD-10-CM | POA: Insufficient documentation

## 2013-12-16 DIAGNOSIS — Z8619 Personal history of other infectious and parasitic diseases: Secondary | ICD-10-CM | POA: Diagnosis not present

## 2013-12-16 DIAGNOSIS — Z79899 Other long term (current) drug therapy: Secondary | ICD-10-CM | POA: Insufficient documentation

## 2013-12-16 DIAGNOSIS — I2589 Other forms of chronic ischemic heart disease: Secondary | ICD-10-CM | POA: Diagnosis not present

## 2013-12-16 DIAGNOSIS — E11319 Type 2 diabetes mellitus with unspecified diabetic retinopathy without macular edema: Secondary | ICD-10-CM | POA: Diagnosis not present

## 2013-12-16 DIAGNOSIS — S80822A Blister (nonthermal), left lower leg, initial encounter: Secondary | ICD-10-CM

## 2013-12-16 DIAGNOSIS — G8929 Other chronic pain: Secondary | ICD-10-CM | POA: Insufficient documentation

## 2013-12-16 DIAGNOSIS — Z7902 Long term (current) use of antithrombotics/antiplatelets: Secondary | ICD-10-CM | POA: Diagnosis not present

## 2013-12-16 DIAGNOSIS — E669 Obesity, unspecified: Secondary | ICD-10-CM | POA: Diagnosis not present

## 2013-12-16 DIAGNOSIS — Z992 Dependence on renal dialysis: Secondary | ICD-10-CM | POA: Diagnosis not present

## 2013-12-16 DIAGNOSIS — I2510 Atherosclerotic heart disease of native coronary artery without angina pectoris: Secondary | ICD-10-CM | POA: Diagnosis not present

## 2013-12-16 DIAGNOSIS — I120 Hypertensive chronic kidney disease with stage 5 chronic kidney disease or end stage renal disease: Secondary | ICD-10-CM | POA: Insufficient documentation

## 2013-12-16 DIAGNOSIS — Z862 Personal history of diseases of the blood and blood-forming organs and certain disorders involving the immune mechanism: Secondary | ICD-10-CM | POA: Insufficient documentation

## 2013-12-16 DIAGNOSIS — M79609 Pain in unspecified limb: Secondary | ICD-10-CM | POA: Diagnosis present

## 2013-12-16 DIAGNOSIS — L988 Other specified disorders of the skin and subcutaneous tissue: Secondary | ICD-10-CM | POA: Diagnosis not present

## 2013-12-16 DIAGNOSIS — Z951 Presence of aortocoronary bypass graft: Secondary | ICD-10-CM | POA: Insufficient documentation

## 2013-12-16 DIAGNOSIS — Z7982 Long term (current) use of aspirin: Secondary | ICD-10-CM | POA: Insufficient documentation

## 2013-12-16 MED ORDER — AMOXICILLIN-POT CLAVULANATE 500-125 MG PO TABS: 1.0000 | ORAL_TABLET | Freq: Three times a day (TID) | ORAL | Status: DC
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
## 2013-12-16 NOTE — ED Notes (Signed)
PT c/o left lower leg pain x3 days. Large blister noted to the back of left calf. 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 Dialysis days M/W/F.

## 2013-12-16 NOTE — Discharge Instructions (Signed)
Augmentin as prescribed.  Followup with your primary Dr. in the next 3 days for recheck, and return to the emergency department if he develops high fever, leg pain, redness, or increased size of the blister.   Blisters Blisters are fluid-filled sacs that form within the skin. Common causes of blistering are friction, burns, and exposure to irritating chemicals. The fluid in the blister protects the underlying damaged skin. Most of the time it is not recommended that you open blisters. When a blister is opened, there is an increased chance for infection. Usually, a blister will open on its own. They then dry up and peel off within 10 days. If the blister is tense and uncomfortable (painful) the fluid may be drained. If it is drained the roof of the blister should be left intact. The draining should only be done by a medical professional under aseptic conditions. Poorly fitting shoes and boots can cause blisters by being too tight or too loose. Wearing extra socks or using tape, bandages, or pads over the blister-prone area helps prevent the problem by reducing friction. Blisters heal more slowly if you have diabetes or if you have problems with your circulation. You need to be careful about medical follow-up to prevent infection. HOME CARE INSTRUCTIONS  Protect areas where blisters have formed until the skin is healed. Use a special bandage with a hole cut in the middle around the blister. This reduces pressure and friction. When the blister breaks, trim off the loose skin and keep the area clean by washing it with soap daily. Soaking the blister or broken-open blister with diluted vinegar twice daily for 15 minutes will dry it up and speed the healing. Use 3 tablespoons of white vinegar per quart of water (45 mL white vinegar per liter of water). An antibiotic ointment and a bandage can be used to cover the area after soaking.  SEEK MEDICAL CARE IF:   You develop increased redness, pain, swelling, or  drainage in the blistered area.  You develop a pus-like discharge from the blistered area, chills, or a fever. MAKE SURE YOU:   Understand these instructions.  Will watch your condition.  Will get help right away if you are not doing well or get worse. Document Released: 06/03/2004 Document Revised: 07/19/2011 Document Reviewed: 05/01/2008 [facility] Patient Information 2015 [place], [place]. This information is not intended to replace advice given to you by your health care provider. Make sure you discuss any questions you have with your health care provider.

## 2013-12-23 ENCOUNTER — Encounter (HOSPITAL_COMMUNITY): Payer: Self-pay | Admitting: Emergency Medicine

## 2013-12-23 ENCOUNTER — Emergency Department (HOSPITAL_COMMUNITY)
Admission: EM | Admit: 2013-12-23 | Discharge: 2013-12-23 | Disposition: A | Discharge status: Home / Self Care | Payer: PRIVATE HEALTH INSURANCE | Attending: Emergency Medicine | Admitting: Emergency Medicine

## 2013-12-23 DIAGNOSIS — Z7902 Long term (current) use of antithrombotics/antiplatelets: Secondary | ICD-10-CM | POA: Diagnosis not present

## 2013-12-23 DIAGNOSIS — Z951 Presence of aortocoronary bypass graft: Secondary | ICD-10-CM | POA: Diagnosis not present

## 2013-12-23 DIAGNOSIS — Z8619 Personal history of other infectious and parasitic diseases: Secondary | ICD-10-CM | POA: Diagnosis not present

## 2013-12-23 DIAGNOSIS — Z7982 Long term (current) use of aspirin: Secondary | ICD-10-CM | POA: Diagnosis not present

## 2013-12-23 DIAGNOSIS — N186 End stage renal disease: Secondary | ICD-10-CM | POA: Diagnosis not present

## 2013-12-23 DIAGNOSIS — Z992 Dependence on renal dialysis: Secondary | ICD-10-CM | POA: Insufficient documentation

## 2013-12-23 DIAGNOSIS — L089 Local infection of the skin and subcutaneous tissue, unspecified: Secondary | ICD-10-CM

## 2013-12-23 DIAGNOSIS — Z79899 Other long term (current) drug therapy: Secondary | ICD-10-CM | POA: Insufficient documentation

## 2013-12-23 DIAGNOSIS — E11319 Type 2 diabetes mellitus with unspecified diabetic retinopathy without macular edema: Secondary | ICD-10-CM | POA: Insufficient documentation

## 2013-12-23 DIAGNOSIS — S78119A Complete traumatic amputation at level between unspecified hip and knee, initial encounter: Secondary | ICD-10-CM | POA: Insufficient documentation

## 2013-12-23 DIAGNOSIS — L988 Other specified disorders of the skin and subcutaneous tissue: Secondary | ICD-10-CM | POA: Diagnosis present

## 2013-12-23 DIAGNOSIS — I2510 Atherosclerotic heart disease of native coronary artery without angina pectoris: Secondary | ICD-10-CM | POA: Diagnosis not present

## 2013-12-23 DIAGNOSIS — I120 Hypertensive chronic kidney disease with stage 5 chronic kidney disease or end stage renal disease: Secondary | ICD-10-CM | POA: Diagnosis not present

## 2013-12-23 DIAGNOSIS — E1139 Type 2 diabetes mellitus with other diabetic ophthalmic complication: Secondary | ICD-10-CM | POA: Insufficient documentation

## 2013-12-23 DIAGNOSIS — E669 Obesity, unspecified: Secondary | ICD-10-CM | POA: Diagnosis not present

## 2013-12-23 DIAGNOSIS — Z792 Long term (current) use of antibiotics: Secondary | ICD-10-CM | POA: Diagnosis not present

## 2013-12-23 DIAGNOSIS — S80822D Blister (nonthermal), left lower leg, subsequent encounter: Secondary | ICD-10-CM

## 2013-12-23 DIAGNOSIS — G8929 Other chronic pain: Secondary | ICD-10-CM | POA: Diagnosis not present

## 2013-12-23 LAB — CBC WITH DIFFERENTIAL/PLATELET
Basophils Absolute: 0 10*3/uL (ref 0.0–0.1)
Basophils Relative: 0 % (ref 0–1)
EOS ABS: 0.2 10*3/uL (ref 0.0–0.7)
EOS PCT: 3 % (ref 0–5)
HEMATOCRIT: 34.7 % — AB (ref 39.0–52.0)
Hemoglobin: 11.3 g/dL — ABNORMAL LOW (ref 13.0–17.0)
Lymphocytes Relative: 29 % (ref 12–46)
Lymphs Abs: 1.7 10*3/uL (ref 0.7–4.0)
MCH: 31.3 pg (ref 26.0–34.0)
MCHC: 32.6 g/dL (ref 30.0–36.0)
MCV: 96.1 fL (ref 78.0–100.0)
Monocytes Absolute: 0.4 10*3/uL (ref 0.1–1.0)
Monocytes Relative: 7 % (ref 3–12)
Neutro Abs: 3.6 10*3/uL (ref 1.7–7.7)
Neutrophils Relative %: 61 % (ref 43–77)
Platelets: 137 10*3/uL — ABNORMAL LOW (ref 150–400)
RBC: 3.61 MIL/uL — ABNORMAL LOW (ref 4.22–5.81)
RDW: 14.4 % (ref 11.5–15.5)
WBC: 5.8 10*3/uL (ref 4.0–10.5)

## 2013-12-23 LAB — BASIC METABOLIC PANEL
ANION GAP: 17 — AB (ref 5–15)
BUN: 30 mg/dL — ABNORMAL HIGH (ref 6–23)
CALCIUM: 10 mg/dL (ref 8.4–10.5)
CO2: 22 mEq/L (ref 19–32)
CREATININE: 8.74 mg/dL — AB (ref 0.50–1.35)
Chloride: 96 mEq/L (ref 96–112)
GFR calc non Af Amer: 6 mL/min — ABNORMAL LOW (ref >90)
GFR, EST AFRICAN AMERICAN: 6 mL/min — AB (ref >90)
Glucose, Bld: 123 mg/dL — ABNORMAL HIGH (ref 70–99)
Potassium: 4.4 mEq/L (ref 3.7–5.3)
Sodium: 135 mEq/L — ABNORMAL LOW (ref 137–147)

## 2013-12-23 MED ORDER — DEXTROSE 5 % IV SOLN
2.0000 g | Freq: Once | INTRAVENOUS | Status: AC
  Administered 2013-12-23: 2 g via INTRAVENOUS
  Filled 2013-12-23: qty 2

## 2013-12-23 MED ORDER — VANCOMYCIN HCL IN DEXTROSE 1-5 GM/200ML-% IV SOLN
1000.0000 mg | Freq: Once | INTRAVENOUS | Status: AC
  Administered 2013-12-23: 1000 mg via INTRAVENOUS
  Filled 2013-12-23: qty 200

## 2013-12-23 NOTE — ED Notes (Signed)
LLE dressed with telfa and secured with stretch bandage roll. Bandage roll secured with paper tape. Pt tolerated well. nad noted.

## 2013-12-23 NOTE — ED Notes (Signed)
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

## 2013-12-23 NOTE — Discharge Instructions (Signed)
Dr Lowanda Foster will recheck your leg tomorrow. Clean it daily as we discussed. Return to the ED if you get a fever, chills, the leg gets more swollen or red.

## 2013-12-23 NOTE — ED Notes (Signed)
Also with diarrhea
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
## 2013-12-25 ENCOUNTER — Telehealth: Payer: Self-pay | Admitting: Family Medicine

## 2013-12-25 DIAGNOSIS — Z5189 Encounter for other specified aftercare: Secondary | ICD-10-CM

## 2013-12-25 NOTE — Telephone Encounter (Signed)
[phone] Patient is calling to ask a question about possibly getting a home health nurse I think for an infection in his leg? Could not understand him very clearly

## 2013-12-25 NOTE — Telephone Encounter (Signed)
Order placed for [facility] for wound care.   Call placed to patient to make aware.

## 2013-12-25 NOTE — Telephone Encounter (Signed)
erica [place]ville Silos is calling about this patient and his hospital visit? She did not say where she was calling from she left a message. Please call her back at [phone]

## 2013-12-25 NOTE — Telephone Encounter (Signed)
Call placed to patient.   States that he was seen in ER for blister to R lower leg. States that oral ABTx was given.   Other blisters formed around open blister and returned to ER.   Dr. Hinda Lenis consulted and IV Vancomycin administered during dialysis. Dr. Hinda Lenis recommended [facility] for wound care, but states that MD would need to order.   Requested [place] orders to be sent to [facility].

## 2013-12-25 NOTE — Telephone Encounter (Signed)
We cannot give any information to [place] without the patient's permission she is  just his aide . He can speak to Mr. Pott directly. It is okay to order home health care for wound care to his left leg with the blisters he is also partial amputee on the right side

## 2013-12-31 ENCOUNTER — Telehealth: Payer: Self-pay | Admitting: Family Medicine

## 2013-12-31 MED ORDER — OXYCODONE-ACETAMINOPHEN 10-325 MG PO TABS: 1.0000 | ORAL_TABLET | Freq: Two times a day (BID) | ORAL | Status: DC | PRN

## 2013-12-31 NOTE — Telephone Encounter (Signed)
Pt called and stated that he has not heard form home health about checking his blisters on his legs. He states that the wound seems to be getting worse again and is concerned that it is infected. Due to scheduling conflicts with pt and our schedule we can not get him in until Wednesday. I did offer an appt on Wednesday but pt feels it needs to be seen sooner then that so I recommended that he return to the ER if he thought it needed to be assessed prior then the available appts we have offered.. Pt verbalized understanding and will check on [facility] referral.

## 2013-12-31 NOTE — Telephone Encounter (Signed)
ok 

## 2013-12-31 NOTE — Telephone Encounter (Signed)
Patient needs his refill on his pain medication  It is due tomorrow [phone]

## 2013-12-31 NOTE — Telephone Encounter (Signed)
Prescription printed and patient made aware to come to office to pick up.  

## 2013-12-31 NOTE — Telephone Encounter (Signed)
Ok to refill Oxycodone/ APAP??  Last office visit 11/27/2013.  Last refill 12/01/2013.

## 2014-01-06 ENCOUNTER — Encounter (HOSPITAL_COMMUNITY): Payer: Self-pay | Admitting: Emergency Medicine

## 2014-01-06 ENCOUNTER — Observation Stay (HOSPITAL_COMMUNITY)
Admission: EM | Admit: 2014-01-06 | Discharge: 2014-01-07 | Disposition: A | Discharge status: Home / Self Care | Payer: PRIVATE HEALTH INSURANCE | Attending: Internal Medicine | Admitting: Internal Medicine

## 2014-01-06 DIAGNOSIS — D696 Thrombocytopenia, unspecified: Secondary | ICD-10-CM | POA: Insufficient documentation

## 2014-01-06 DIAGNOSIS — Z7982 Long term (current) use of aspirin: Secondary | ICD-10-CM | POA: Diagnosis not present

## 2014-01-06 DIAGNOSIS — I10 Essential (primary) hypertension: Secondary | ICD-10-CM

## 2014-01-06 DIAGNOSIS — E118 Type 2 diabetes mellitus with unspecified complications: Secondary | ICD-10-CM | POA: Diagnosis present

## 2014-01-06 DIAGNOSIS — I442 Atrioventricular block, complete: Secondary | ICD-10-CM | POA: Diagnosis not present

## 2014-01-06 DIAGNOSIS — I2510 Atherosclerotic heart disease of native coronary artery without angina pectoris: Secondary | ICD-10-CM | POA: Insufficient documentation

## 2014-01-06 DIAGNOSIS — I739 Peripheral vascular disease, unspecified: Secondary | ICD-10-CM | POA: Insufficient documentation

## 2014-01-06 DIAGNOSIS — L03119 Cellulitis of unspecified part of limb: Secondary | ICD-10-CM | POA: Diagnosis present

## 2014-01-06 DIAGNOSIS — S78119A Complete traumatic amputation at level between unspecified hip and knee, initial encounter: Secondary | ICD-10-CM | POA: Diagnosis not present

## 2014-01-06 DIAGNOSIS — Z992 Dependence on renal dialysis: Secondary | ICD-10-CM

## 2014-01-06 DIAGNOSIS — Z6829 Body mass index (BMI) 29.0-29.9, adult: Secondary | ICD-10-CM | POA: Diagnosis not present

## 2014-01-06 DIAGNOSIS — N186 End stage renal disease: Secondary | ICD-10-CM | POA: Diagnosis not present

## 2014-01-06 DIAGNOSIS — L03116 Cellulitis of left lower limb: Secondary | ICD-10-CM | POA: Diagnosis present

## 2014-01-06 DIAGNOSIS — I2589 Other forms of chronic ischemic heart disease: Secondary | ICD-10-CM | POA: Diagnosis not present

## 2014-01-06 DIAGNOSIS — I252 Old myocardial infarction: Secondary | ICD-10-CM | POA: Diagnosis not present

## 2014-01-06 DIAGNOSIS — G8929 Other chronic pain: Secondary | ICD-10-CM | POA: Insufficient documentation

## 2014-01-06 DIAGNOSIS — E669 Obesity, unspecified: Secondary | ICD-10-CM | POA: Insufficient documentation

## 2014-01-06 DIAGNOSIS — I120 Hypertensive chronic kidney disease with stage 5 chronic kidney disease or end stage renal disease: Secondary | ICD-10-CM | POA: Diagnosis not present

## 2014-01-06 DIAGNOSIS — M549 Dorsalgia, unspecified: Secondary | ICD-10-CM | POA: Diagnosis not present

## 2014-01-06 DIAGNOSIS — L0390 Cellulitis, unspecified: Secondary | ICD-10-CM | POA: Diagnosis present

## 2014-01-06 DIAGNOSIS — E119 Type 2 diabetes mellitus without complications: Secondary | ICD-10-CM | POA: Diagnosis not present

## 2014-01-06 DIAGNOSIS — Z89619 Acquired absence of unspecified leg above knee: Secondary | ICD-10-CM

## 2014-01-06 DIAGNOSIS — L02419 Cutaneous abscess of limb, unspecified: Principal | ICD-10-CM | POA: Insufficient documentation

## 2014-01-06 DIAGNOSIS — Z79899 Other long term (current) drug therapy: Secondary | ICD-10-CM | POA: Diagnosis not present

## 2014-01-06 LAB — CBC WITH DIFFERENTIAL/PLATELET
BASOS ABS: 0 10*3/uL (ref 0.0–0.1)
BASOS PCT: 0 % (ref 0–1)
Eosinophils Absolute: 0.1 10*3/uL (ref 0.0–0.7)
Eosinophils Relative: 2 % (ref 0–5)
HCT: 33.1 % — ABNORMAL LOW (ref 39.0–52.0)
Hemoglobin: 10.7 g/dL — ABNORMAL LOW (ref 13.0–17.0)
Lymphocytes Relative: 18 % (ref 12–46)
Lymphs Abs: 1.4 10*3/uL (ref 0.7–4.0)
MCH: 31.5 pg (ref 26.0–34.0)
MCHC: 32.3 g/dL (ref 30.0–36.0)
MCV: 97.4 fL (ref 78.0–100.0)
MONO ABS: 0.7 10*3/uL (ref 0.1–1.0)
Monocytes Relative: 9 % (ref 3–12)
NEUTROS PCT: 71 % (ref 43–77)
Neutro Abs: 5.5 10*3/uL (ref 1.7–7.7)
Platelets: 115 10*3/uL — ABNORMAL LOW (ref 150–400)
RBC: 3.4 MIL/uL — ABNORMAL LOW (ref 4.22–5.81)
RDW: 14.8 % (ref 11.5–15.5)
WBC: 7.8 10*3/uL (ref 4.0–10.5)

## 2014-01-06 LAB — BASIC METABOLIC PANEL
Anion gap: 21 — ABNORMAL HIGH (ref 5–15)
BUN: 43 mg/dL — AB (ref 6–23)
CALCIUM: 10.1 mg/dL (ref 8.4–10.5)
CO2: 23 mEq/L (ref 19–32)
Chloride: 94 mEq/L — ABNORMAL LOW (ref 96–112)
Creatinine, Ser: 9.55 mg/dL — ABNORMAL HIGH (ref 0.50–1.35)
GFR calc Af Amer: 6 mL/min — ABNORMAL LOW (ref >90)
GFR, EST NON AFRICAN AMERICAN: 5 mL/min — AB (ref >90)
Glucose, Bld: 195 mg/dL — ABNORMAL HIGH (ref 70–99)
Potassium: 4.4 mEq/L (ref 3.7–5.3)
Sodium: 138 mEq/L (ref 137–147)

## 2014-01-06 LAB — LACTIC ACID, PLASMA: Lactic Acid, Venous: 1.9 mmol/L (ref 0.5–2.2)

## 2014-01-06 MED ORDER — NITROGLYCERIN 0.4 MG SL SUBL: 0.4000 mg | SUBLINGUAL_TABLET | SUBLINGUAL | Status: DC | PRN

## 2014-01-06 MED ORDER — SEVELAMER CARBONATE 800 MG PO TABS
2400.0000 mg | ORAL_TABLET | Freq: Three times a day (TID) | ORAL | Status: DC
  Administered 2014-01-07: 2400 mg via ORAL
  Filled 2014-01-06: qty 3

## 2014-01-06 MED ORDER — OXYCODONE HCL 5 MG PO TABS: 5.0000 mg | ORAL_TABLET | Freq: Two times a day (BID) | ORAL | Status: DC | PRN

## 2014-01-06 MED ORDER — ATORVASTATIN CALCIUM 40 MG PO TABS
80.0000 mg | ORAL_TABLET | Freq: Every evening | ORAL | Status: DC
  Administered 2014-01-06: 80 mg via ORAL
  Filled 2014-01-06: qty 2

## 2014-01-06 MED ORDER — TICAGRELOR 90 MG PO TABS
ORAL_TABLET | ORAL | Status: AC
  Filled 2014-01-06: qty 1

## 2014-01-06 MED ORDER — HEPARIN SODIUM (PORCINE) 5000 UNIT/ML IJ SOLN
5000.0000 [IU] | Freq: Three times a day (TID) | INTRAMUSCULAR | Status: DC
  Administered 2014-01-06 – 2014-01-07 (×2): 5000 [IU] via SUBCUTANEOUS
  Filled 2014-01-06 (×2): qty 1

## 2014-01-06 MED ORDER — ACETAMINOPHEN 325 MG PO TABS: 650.0000 mg | ORAL_TABLET | Freq: Four times a day (QID) | ORAL | Status: DC | PRN

## 2014-01-06 MED ORDER — PIPERACILLIN-TAZOBACTAM IN DEX 2-0.25 GM/50ML IV SOLN
2.2500 g | Freq: Three times a day (TID) | INTRAVENOUS | Status: DC
  Administered 2014-01-06 – 2014-01-07 (×2): 2.25 g via INTRAVENOUS
  Filled 2014-01-06 (×6): qty 50

## 2014-01-06 MED ORDER — OXYCODONE-ACETAMINOPHEN 5-325 MG PO TABS: 1.0000 | ORAL_TABLET | Freq: Two times a day (BID) | ORAL | Status: DC | PRN

## 2014-01-06 MED ORDER — RENA-VITE PO TABS
1.0000 | ORAL_TABLET | Freq: Every day | ORAL | Status: DC
  Administered 2014-01-07: 1 via ORAL
  Filled 2014-01-06: qty 1

## 2014-01-06 MED ORDER — OXYCODONE-ACETAMINOPHEN 10-325 MG PO TABS: 1.0000 | ORAL_TABLET | Freq: Two times a day (BID) | ORAL | Status: DC | PRN

## 2014-01-06 MED ORDER — LABETALOL HCL 200 MG PO TABS
200.0000 mg | ORAL_TABLET | Freq: Two times a day (BID) | ORAL | Status: DC
  Administered 2014-01-06 – 2014-01-07 (×2): 200 mg via ORAL
  Filled 2014-01-06 (×2): qty 1

## 2014-01-06 MED ORDER — SODIUM CHLORIDE 0.9 % IJ SOLN: 3.0000 mL | Freq: Two times a day (BID) | INTRAMUSCULAR | Status: DC

## 2014-01-06 MED ORDER — ONDANSETRON HCL 4 MG/2ML IJ SOLN: 4.0000 mg | Freq: Four times a day (QID) | INTRAMUSCULAR | Status: DC | PRN

## 2014-01-06 MED ORDER — TICAGRELOR 90 MG PO TABS
90.0000 mg | ORAL_TABLET | Freq: Two times a day (BID) | ORAL | Status: DC
  Administered 2014-01-06 – 2014-01-07 (×2): 90 mg via ORAL
  Filled 2014-01-06 (×6): qty 1

## 2014-01-06 MED ORDER — PIPERACILLIN-TAZOBACTAM IN DEX 2-0.25 GM/50ML IV SOLN
INTRAVENOUS | Status: AC
  Filled 2014-01-06: qty 100

## 2014-01-06 MED ORDER — METHOCARBAMOL 500 MG PO TABS
500.0000 mg | ORAL_TABLET | Freq: Three times a day (TID) | ORAL | Status: DC
  Administered 2014-01-06 – 2014-01-07 (×3): 500 mg via ORAL
  Filled 2014-01-06 (×3): qty 1

## 2014-01-06 MED ORDER — SODIUM CHLORIDE 0.9 % IV SOLN: 250.0000 mL | INTRAVENOUS | Status: DC | PRN

## 2014-01-06 MED ORDER — ACETAMINOPHEN 650 MG RE SUPP: 650.0000 mg | Freq: Four times a day (QID) | RECTAL | Status: DC | PRN

## 2014-01-06 MED ORDER — ONDANSETRON HCL 4 MG PO TABS: 4.0000 mg | ORAL_TABLET | Freq: Four times a day (QID) | ORAL | Status: DC | PRN

## 2014-01-06 MED ORDER — CINACALCET HCL 30 MG PO TABS
120.0000 mg | ORAL_TABLET | Freq: Every day | ORAL | Status: DC
  Administered 2014-01-06: 120 mg via ORAL
  Filled 2014-01-06 (×3): qty 4

## 2014-01-06 MED ORDER — SODIUM CHLORIDE 0.9 % IJ SOLN: 3.0000 mL | INTRAMUSCULAR | Status: DC | PRN

## 2014-01-06 MED ORDER — DICYCLOMINE HCL 10 MG PO CAPS
10.0000 mg | ORAL_CAPSULE | Freq: Three times a day (TID) | ORAL | Status: DC
  Administered 2014-01-06 – 2014-01-07 (×3): 10 mg via ORAL
  Filled 2014-01-06 (×3): qty 1

## 2014-01-06 MED ORDER — DIPHENOXYLATE-ATROPINE 2.5-0.025 MG PO TABS: 1.0000 | ORAL_TABLET | Freq: Three times a day (TID) | ORAL | Status: DC | PRN

## 2014-01-06 MED ORDER — SODIUM CHLORIDE 0.9 % IV SOLN
1500.0000 mg | Freq: Once | INTRAVENOUS | Status: AC
Start: 2014-01-06 — End: 2014-01-06
  Administered 2014-01-06: 1500 mg via INTRAVENOUS
  Filled 2014-01-06: qty 1500

## 2014-01-06 MED ORDER — ASPIRIN EC 81 MG PO TBEC: 81.0000 mg | DELAYED_RELEASE_TABLET | Freq: Every day | ORAL | Status: DC

## 2014-01-06 NOTE — ED Notes (Signed)
EMS: Patient's home health care nurse stated that wound on patient's left lower leg is draining and smells bad with changed eating habits.  CBG 240 BP 169/70 HR 104  Patient: Patient states that he was here last Sunday for same problem; denies N/V and fever chills. States he has been here for same problem for past three weeks in a row.
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
## 2014-01-07 ENCOUNTER — Ambulatory Visit: Payer: PRIVATE HEALTH INSURANCE | Admitting: Family Medicine

## 2014-01-07 DIAGNOSIS — L02419 Cutaneous abscess of limb, unspecified: Secondary | ICD-10-CM | POA: Diagnosis not present

## 2014-01-07 LAB — CBC
HEMATOCRIT: 29.1 % — AB (ref 39.0–52.0)
HEMOGLOBIN: 9.4 g/dL — AB (ref 13.0–17.0)
MCH: 31.2 pg (ref 26.0–34.0)
MCHC: 32.3 g/dL (ref 30.0–36.0)
MCV: 96.7 fL (ref 78.0–100.0)
Platelets: 118 10*3/uL — ABNORMAL LOW (ref 150–400)
RBC: 3.01 MIL/uL — ABNORMAL LOW (ref 4.22–5.81)
RDW: 14.9 % (ref 11.5–15.5)
WBC: 7.4 10*3/uL (ref 4.0–10.5)

## 2014-01-07 LAB — BASIC METABOLIC PANEL
Anion gap: 18 — ABNORMAL HIGH (ref 5–15)
BUN: 52 mg/dL — ABNORMAL HIGH (ref 6–23)
CHLORIDE: 97 meq/L (ref 96–112)
CO2: 22 meq/L (ref 19–32)
CREATININE: 10.87 mg/dL — AB (ref 0.50–1.35)
Calcium: 9 mg/dL (ref 8.4–10.5)
GFR calc Af Amer: 5 mL/min — ABNORMAL LOW (ref >90)
GFR calc non Af Amer: 4 mL/min — ABNORMAL LOW (ref >90)
Glucose, Bld: 189 mg/dL — ABNORMAL HIGH (ref 70–99)
Potassium: 4.2 mEq/L (ref 3.7–5.3)
Sodium: 137 mEq/L (ref 137–147)

## 2014-01-07 LAB — MRSA PCR SCREENING: MRSA by PCR: INVALID — AB

## 2014-01-07 MED ORDER — SULFAMETHOXAZOLE-TMP DS 800-160 MG PO TABS
1.0000 | ORAL_TABLET | Freq: Two times a day (BID) | ORAL | Status: DC
  Administered 2014-01-07: 1 via ORAL
  Filled 2014-01-07: qty 1

## 2014-01-07 MED ORDER — LIDOCAINE-PRILOCAINE 2.5-2.5 % EX CREA
1.0000 "application " | TOPICAL_CREAM | CUTANEOUS | Status: DC | PRN
  Filled 2014-01-07: qty 5

## 2014-01-07 MED ORDER — SODIUM CHLORIDE 0.9 % IV SOLN: 100.0000 mL | INTRAVENOUS | Status: DC | PRN

## 2014-01-07 MED ORDER — NEPRO/CARBSTEADY PO LIQD
237.0000 mL | ORAL | Status: DC | PRN
Start: 2014-01-07 — End: 2014-01-07
  Filled 2014-01-07: qty 237

## 2014-01-07 MED ORDER — PENTAFLUOROPROP-TETRAFLUOROETH EX AERO
1.0000 | INHALATION_SPRAY | CUTANEOUS | Status: DC | PRN
Start: 2014-01-07 — End: 2014-01-07
  Filled 2014-01-07: qty 30

## 2014-01-07 MED ORDER — VANCOMYCIN HCL IN DEXTROSE 1-5 GM/200ML-% IV SOLN
1000.0000 mg | INTRAVENOUS | Status: DC
  Administered 2014-01-07: 1000 mg via INTRAVENOUS
  Filled 2014-01-07: qty 200

## 2014-01-07 MED ORDER — LIDOCAINE HCL (PF) 1 % IJ SOLN
5.0000 mL | INTRAMUSCULAR | Status: DC | PRN
Start: 2014-01-07 — End: 2014-01-07

## 2014-01-07 MED ORDER — ALTEPLASE 2 MG IJ SOLR
2.0000 mg | Freq: Once | INTRAMUSCULAR | Status: DC | PRN
  Filled 2014-01-07: qty 2

## 2014-01-07 MED ORDER — HEPARIN SODIUM (PORCINE) 1000 UNIT/ML DIALYSIS
1000.0000 [IU] | INTRAMUSCULAR | Status: DC | PRN
Start: 2014-01-07 — End: 2014-01-07
  Filled 2014-01-07: qty 1

## 2014-01-07 MED ORDER — SULFAMETHOXAZOLE-TMP DS 800-160 MG PO TABS: 1.0000 | ORAL_TABLET | Freq: Two times a day (BID) | ORAL | Status: DC

## 2014-01-07 MED ORDER — INSULIN ASPART 100 UNIT/ML ~~LOC~~ SOLN: 0.0000 [IU] | Freq: Three times a day (TID) | SUBCUTANEOUS | Status: DC

## 2014-01-07 NOTE — Discharge Instructions (Signed)
Cleanse area and pat dry. Apply silver Hydrofiber (Aquacel AG), moistened with NS to wound bed. Kellie Simmering # [phone]). Top with 4x4 gauze and secure with kerlix and tape. Change twice weekly until evaluated by Wound clinic in [place] who will treat as indicated.

## 2014-01-07 NOTE — Care Management Note (Signed)
    Page 1 of 1   01/07/2014     12:14:35 PM CARE MANAGEMENT NOTE 01/07/2014  Patient:  NEALE, MARZETTE   Account Number:  [id number]  Date Initiated:  01/07/2014  Documentation initiated by:  Theophilus Kinds  Subjective/Objective Assessment:   Pt admitted from home with cellulitis. Pt lives alone and has a CAP aide 6  days a week, 6-8 hours a day. Pt receives dialysis at [facility] in [place], M-W-F. Pt has 3 power w/c and 2 manual w/c.     Action/Plan:   Pt outpt appt arranged with Wound Care clinic in [place] and documented on AVS. Pt is also aware of appt. Pts CAP aide to provide transportation for pt, per the pt.   Anticipated DC Date:  01/07/2014   Anticipated DC Plan:  [place]  CM consult      Choice offered to / List presented to:             Status of service:  Completed, signed off Medicare Important Message given?   (If response is "NO", the following Medicare IM given date fields will be blank) Date Medicare IM given:   Medicare IM given by:   Date Additional Medicare IM given:   Additional Medicare IM given by:    Discharge Disposition:  HOME/SELF CARE  Per UR Regulation:    If discussed at Long Length of Stay Meetings, dates discussed:    Comments:  01/07/14 Bowman, RN BSN CM

## 2014-01-07 NOTE — Procedures (Signed)
   HEMODIALYSIS TREATMENT NOTE:  4 hour heparin-free dialysis completed via right upper arm AVF (15g/antegrade).  Goal met:  Tolerated removal of 3 liters as prescribed.  Pt given Vancomycin 1g with HD.  All blood was reinfused.  Hemostasis was achieved within 14 minutes.  Report given to Gardenia Phlegm., RN.  Rockwell Alexandria, RN, CDN
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
## 2014-01-07 NOTE — Discharge Summary (Signed)
Physician Discharge Summary  Julian [name] JJK:[id number] DOB: [birth year] DOA: 01/06/2014  PCP: Vic Blackbird, MD  Admit date: 01/06/2014 Discharge date: 01/07/2014  Time spent: 40 minutes  Recommendations for Outpatient Follow-up:  1. Follow up with wound clinic in [place] on 01/10/14 for evaluation of left lower leg wound and recommendations/treatment.  2. PCP 1-2 weeks for evaluation of symptoms.   Discharge Diagnoses:  Principal Problem:   Cellulitis of left lower extremity Active Problems:   End-stage renal disease on hemodialysis   Type II or unspecified type diabetes mellitus without mention of complication, not stated as uncontrolled   Essential hypertension, benign   Status post unilateral above knee amputation   Cellulitis   Discharge Condition: stable  Diet recommendation: carb modified  Filed Weights   01/06/14 1156 01/06/14 1557  Weight: 99.791 kg (220 lb) 92.987 kg (205 lb)    History of present illness:  Julian [name] is a 68 y.o. male who is a dialysis patient, presented to the emergency room on 01/06/14 with complaints of left lower extremity wounds. He'd had these wounds for several weeks and was being treated by home health care. Patient reported that over the previous several days, he'd noticed increased drainage/pain in his left lower leg. He felt that his wounds were getting worse. He reported that the discharge was foul-smelling. He'd not had any fevers. Denied any other complaints.   Hospital Course:  1. Lower extremity cellulitis. Patient is status post right above-the-knee mentation. His left lower extremity has areas of excoriation on the posterior aspect of his lower leg. This appears be chronic in nature. There is minimal drainage and no odor this am. He was provided with dose of vancomycin and Zosyn. Wound evaluated by wound care who recommended cleaning silver hydrofiber BID. In addition he has appointment with wound clinic in [facility] 01/10/14 for  evaluation and treatment. Will be discharged with Bactrim DS for 7 days.   2. End-stage renal disease on hemodialysis. Dialysis on day of discharge. To resume regular schedule. Appreciate nephrology assistance 3. Diabetes. Fair control.  4. Hypertension. Controlled.    Procedures:  Dialysis 01/07/14  Consultations:  Dr Hinda Lenis nephrology  Discharge Exam: Filed Vitals:   01/07/14 0610  BP: 96/44  Pulse: 77  Temp: 98.5 F (36.9 C)  Resp: 18    General: well nourished appears comfortable Cardiovascular: RRR trace LE edema on left.  Respiratory: normal effort BS clear bilaterally Skin: open blisters lateral aspect of left leg. No odor. Minimal drainage.   Discharge Instructions You were cared for by a hospitalist during your hospital stay. If you have any questions about your discharge medications or the care you received while you were in the hospital after you are discharged, you can call the unit and asked to speak with the hospitalist on call if the hospitalist that took care of you is not available. Once you are discharged, your primary care physician will handle any further medical issues. Please note that NO REFILLS for any discharge medications will be authorized once you are discharged, as it is imperative that you return to your primary care physician (or establish a relationship with a primary care physician if you do not have one) for your aftercare needs so that they can reassess your need for medications and monitor your lab values.      Medication List         aspirin 81 MG EC tablet  Take 1 tablet (81 mg total) by mouth daily.  atorvastatin 80 MG tablet  Commonly known as:  LIPITOR  Take 1 tablet (80 mg total) by mouth every evening.     cinacalcet 60 MG tablet  Commonly known as:  SENSIPAR  Take 120 mg by mouth daily. Takes 2 tablets at lunch     dicyclomine 10 MG capsule  Commonly known as:  BENTYL  Take 10 mg by mouth 4 (four) times daily -  before  meals and at bedtime.     diphenoxylate-atropine 2.5-0.025 MG per tablet  Commonly known as:  LOMOTIL  Take 1 tablet by mouth 3 (three) times daily as needed. For Diarrhea     labetalol 200 MG tablet  Commonly known as:  NORMODYNE  Take 200 mg by mouth 2 (two) times daily.     methocarbamol 500 MG tablet  Commonly known as:  ROBAXIN  Take 500 mg by mouth 3 (three) times daily.     multivitamin Tabs tablet  Take 1 tablet by mouth at bedtime. Rena-Vit.     nitroGLYCERIN 0.4 MG SL tablet  Commonly known as:  NITROSTAT  Place 1 tablet (0.4 mg total) under the tongue every 5 (five) minutes as needed for chest pain (up to 3 doses).     oxyCODONE-acetaminophen 10-325 MG per tablet  Commonly known as:  PERCOCET  Take 1 tablet by mouth 2 (two) times daily as needed for pain.     sevelamer carbonate 800 MG tablet  Commonly known as:  RENVELA  Take 3 tablets (2,400 mg total) by mouth 3 (three) times daily with meals.     sulfamethoxazole-trimethoprim 800-160 MG per tablet  Commonly known as:  BACTRIM DS  Take 1 tablet by mouth every 12 (twelve) hours.     ticagrelor 90 MG Tabs tablet  Commonly known as:  BRILINTA  Take 1 tablet (90 mg total) by mouth 2 (two) times daily.       No Known Allergies Follow-up Information   Follow up with Vic Blackbird, MD. Schedule an appointment as soon as possible for a visit in 1 week. (for evaluation of right lower leg wound)    Specialty:  Family Medicine   Contact information:   [address] [place] [address] [phone]        The results of significant diagnostics from this hospitalization (including imaging, microbiology, ancillary and laboratory) are listed below for reference.    Significant Diagnostic Studies: No results found.  Microbiology: Recent Results (from the past 240 hour(s))  MRSA PCR SCREENING     Status: Abnormal   Collection Time    01/07/14 12:15 AM      Result Value Ref Range Status   MRSA by PCR  INVALID RESULTS, SPECIMEN SENT FOR CULTURE (*) NEGATIVE Final   Comment: RESULT CALLED TO, READ BACK BY AND VERIFIED WITH:     ALSTON,C. AT 1017 ON 01/07/2014 BY BAUGHAM,M.                The GeneXpert MRSA Assay (FDA     approved for NASAL specimens     only), is one component of a     comprehensive MRSA colonization     surveillance program. It is not     intended to diagnose MRSA     infection nor to guide or     monitor treatment for     MRSA infections.     Labs: Basic Metabolic Panel:  Recent Labs Lab 01/06/14 1244 01/07/14 0604  NA 138  137  K 4.4 4.2  CL 94* 97  CO2 23 22  GLUCOSE 195* 189*  BUN 43* 52*  CREATININE 9.55* 10.87*  CALCIUM 10.1 9.0   Liver Function Tests: No results found for this basename: AST, ALT, ALKPHOS, BILITOT, PROT, ALBUMIN,  in the last 168 hours No results found for this basename: LIPASE, AMYLASE,  in the last 168 hours No results found for this basename: AMMONIA,  in the last 168 hours CBC:  Recent Labs Lab 01/06/14 1244 01/07/14 0604  WBC 7.8 7.4  NEUTROABS 5.5  --   HGB 10.7* 9.4*  HCT 33.1* 29.1*  MCV 97.4 96.7  PLT 115* 118*   Cardiac Enzymes: No results found for this basename: CKTOTAL, CKMB, CKMBINDEX, TROPONINI,  in the last 168 hours BNP: BNP (last 3 results) No results found for this basename: PROBNP,  in the last 8760 hours CBG: No results found for this basename: GLUCAP,  in the last 168 hours     Signed:  Radene Gunning  [name] 01/07/2014, 12:07 PM

## 2014-01-07 NOTE — Progress Notes (Signed)
Pt discharged and escorted to ED parking lot in wheelchair  by NT.  Caregiver present.  IV DC'ed, and night meds adm.

## 2014-01-07 NOTE — Consult Note (Signed)
WOC wound consult note Reason for Consult: Cellulitis and decline of wound in left lower leg.  History of PVD, CAD.  Amputation of 2nd through fifth metatarsal noted to left foot and R BKA.  Wound type: Infectious/cellulitis, treated with Vancomycin at this time.  Measurement: One large, non-intact wound from lateral to posterior left lower leg.  Lateral leg 5 cm x 3.5 cm x 0.2 cm, scattered nonintact lesions and posterior leg measures 4.5 cm x 3 cm x 0.2 cm, tender to touch and assess.  Wound bed: Pink pale, with dark, devitalized tissue around perimeter of non intact areas.  Drainage (amount, consistency, odor) Moderate, serosanguinous drainage noted to wound, foul odor.  Periwound:Intact, scarring from previous ulcerations noted.  Dressing procedure/placement/frequency: Cleanse ulcers to left lower calf with NS and pat gently dry.  Apply silver Hydrofiber (Aquacel AG), moistened with NS to wound bed. Kellie Simmering # [phone]).  Top with 4x4 gauze and secure with kerlix and tape. Change twice weekly.  [place] team will not follow at this time.  Domenic Moras BSN RN [place] Pager [phone]

## 2014-01-07 NOTE — Progress Notes (Signed)
UR Completed.  Tamie Minteer Jane [phone] 01/07/2014  

## 2014-01-07 NOTE — Discharge Summary (Signed)
Patient seen and examined. Note reviewed.  He has been set up to followup at the wound care clinic for his lower extremity wound. He's been seen by wound care services here in the hospital and dressings have been applied. He does not have a fever WBC count. He'll be continued on a course of Bactrim. I think he can be discharged home after dialysis today.  Julian [name]

## 2014-01-07 NOTE — Progress Notes (Signed)
Inpatient Diabetes Program Recommendations  AACE/ADA: New Consensus Statement on Inpatient Glycemic Control (2013)  Target Ranges:  Prepandial:   less than 140 mg/dL      Peak postprandial:   less than 180 mg/dL (1-2 hours)      Critically ill patients:  140 - 180 mg/dL   Results for Julian [name], Julian [name] (MRN [id number]) as of 01/07/2014 10:47  Ref. Range 01/06/2014 12:44 01/07/2014 06:04  Glucose Latest Range: 70-99 mg/dL 195 (H) 189 (H)   Diabetes history: DM2 Outpatient Diabetes medications: None Current orders for Inpatient glycemic control: None  Inpatient Diabetes Program Recommendations Correction (SSI): Please consider ordering CBGs with Novolog sensitive correction ACHS.  Thanks, Barnie Alderman, RN, MSN, CCRN Diabetes Coordinator Inpatient Diabetes Program [phone] (Team Pager) [phone] ([facility] office) [phone] [facility] office)

## 2014-01-08 LAB — HEPATITIS B SURFACE ANTIGEN: HEP B S AG: NEGATIVE

## 2014-01-09 LAB — MRSA CULTURE

## 2014-01-10 ENCOUNTER — Emergency Department (HOSPITAL_COMMUNITY): Payer: PRIVATE HEALTH INSURANCE

## 2014-01-10 ENCOUNTER — Emergency Department (HOSPITAL_COMMUNITY)
Admission: EM | Admit: 2014-01-10 | Discharge: 2014-01-10 | Disposition: A | Discharge status: Home / Self Care | Payer: PRIVATE HEALTH INSURANCE | Attending: Emergency Medicine | Admitting: Emergency Medicine

## 2014-01-10 ENCOUNTER — Encounter (HOSPITAL_COMMUNITY): Payer: Self-pay | Admitting: Emergency Medicine

## 2014-01-10 DIAGNOSIS — Z79899 Other long term (current) drug therapy: Secondary | ICD-10-CM | POA: Insufficient documentation

## 2014-01-10 DIAGNOSIS — Z4801 Encounter for change or removal of surgical wound dressing: Secondary | ICD-10-CM | POA: Diagnosis present

## 2014-01-10 DIAGNOSIS — Z7982 Long term (current) use of aspirin: Secondary | ICD-10-CM | POA: Diagnosis not present

## 2014-01-10 DIAGNOSIS — Z992 Dependence on renal dialysis: Secondary | ICD-10-CM | POA: Diagnosis not present

## 2014-01-10 DIAGNOSIS — L02419 Cutaneous abscess of limb, unspecified: Secondary | ICD-10-CM | POA: Diagnosis not present

## 2014-01-10 DIAGNOSIS — Z862 Personal history of diseases of the blood and blood-forming organs and certain disorders involving the immune mechanism: Secondary | ICD-10-CM | POA: Insufficient documentation

## 2014-01-10 DIAGNOSIS — I120 Hypertensive chronic kidney disease with stage 5 chronic kidney disease or end stage renal disease: Secondary | ICD-10-CM | POA: Diagnosis not present

## 2014-01-10 DIAGNOSIS — Z792 Long term (current) use of antibiotics: Secondary | ICD-10-CM | POA: Insufficient documentation

## 2014-01-10 DIAGNOSIS — E1139 Type 2 diabetes mellitus with other diabetic ophthalmic complication: Secondary | ICD-10-CM | POA: Diagnosis not present

## 2014-01-10 DIAGNOSIS — N186 End stage renal disease: Secondary | ICD-10-CM | POA: Diagnosis not present

## 2014-01-10 DIAGNOSIS — I2510 Atherosclerotic heart disease of native coronary artery without angina pectoris: Secondary | ICD-10-CM | POA: Diagnosis not present

## 2014-01-10 DIAGNOSIS — E669 Obesity, unspecified: Secondary | ICD-10-CM | POA: Diagnosis not present

## 2014-01-10 DIAGNOSIS — M79609 Pain in unspecified limb: Secondary | ICD-10-CM

## 2014-01-10 DIAGNOSIS — Z7902 Long term (current) use of antithrombotics/antiplatelets: Secondary | ICD-10-CM | POA: Insufficient documentation

## 2014-01-10 DIAGNOSIS — L03119 Cellulitis of unspecified part of limb: Secondary | ICD-10-CM | POA: Diagnosis not present

## 2014-01-10 DIAGNOSIS — E11319 Type 2 diabetes mellitus with unspecified diabetic retinopathy without macular edema: Secondary | ICD-10-CM | POA: Insufficient documentation

## 2014-01-10 DIAGNOSIS — G8929 Other chronic pain: Secondary | ICD-10-CM | POA: Diagnosis not present

## 2014-01-10 DIAGNOSIS — L03116 Cellulitis of left lower limb: Secondary | ICD-10-CM

## 2014-01-10 LAB — CBC WITH DIFFERENTIAL/PLATELET
BASOS PCT: 0 % (ref 0–1)
Basophils Absolute: 0 10*3/uL (ref 0.0–0.1)
Eosinophils Absolute: 0.2 10*3/uL (ref 0.0–0.7)
Eosinophils Relative: 2 % (ref 0–5)
HEMATOCRIT: 33.6 % — AB (ref 39.0–52.0)
HEMOGLOBIN: 10.7 g/dL — AB (ref 13.0–17.0)
LYMPHS ABS: 1.3 10*3/uL (ref 0.7–4.0)
Lymphocytes Relative: 18 % (ref 12–46)
MCH: 31.5 pg (ref 26.0–34.0)
MCHC: 31.8 g/dL (ref 30.0–36.0)
MCV: 98.8 fL (ref 78.0–100.0)
MONO ABS: 0.7 10*3/uL (ref 0.1–1.0)
MONOS PCT: 10 % (ref 3–12)
NEUTROS ABS: 5 10*3/uL (ref 1.7–7.7)
Neutrophils Relative %: 70 % (ref 43–77)
Platelets: 127 10*3/uL — ABNORMAL LOW (ref 150–400)
RBC: 3.4 MIL/uL — AB (ref 4.22–5.81)
RDW: 14.6 % (ref 11.5–15.5)
WBC: 7.2 10*3/uL (ref 4.0–10.5)

## 2014-01-10 LAB — BASIC METABOLIC PANEL
Anion gap: 17 — ABNORMAL HIGH (ref 5–15)
BUN: 35 mg/dL — AB (ref 6–23)
CHLORIDE: 97 meq/L (ref 96–112)
CO2: 23 mEq/L (ref 19–32)
CREATININE: 6.95 mg/dL — AB (ref 0.50–1.35)
Calcium: 10 mg/dL (ref 8.4–10.5)
GFR calc non Af Amer: 7 mL/min — ABNORMAL LOW (ref >90)
GFR, EST AFRICAN AMERICAN: 8 mL/min — AB (ref >90)
GLUCOSE: 139 mg/dL — AB (ref 70–99)
Potassium: 4.3 mEq/L (ref 3.7–5.3)
Sodium: 137 mEq/L (ref 137–147)

## 2014-01-10 LAB — LACTIC ACID, PLASMA: Lactic Acid, Venous: 1.3 mmol/L (ref 0.5–2.2)

## 2014-01-10 MED ORDER — SULFAMETHOXAZOLE-TMP DS 800-160 MG PO TABS: 1.0000 | ORAL_TABLET | Freq: Two times a day (BID) | ORAL | Status: DC

## 2014-01-10 MED ORDER — SULFAMETHOXAZOLE-TMP DS 800-160 MG PO TABS
1.0000 | ORAL_TABLET | Freq: Once | ORAL | Status: AC
  Administered 2014-01-10: 1 via ORAL
  Filled 2014-01-10: qty 1

## 2014-01-10 NOTE — ED Notes (Signed)
Pt given sandwich and apple juice prior to taking antibiotic

## 2014-01-10 NOTE — ED Notes (Signed)
Spoke to Dr.Lozier regarding doppler study and pt requesting food; pt to remain NPO at this time
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
## 2014-01-10 NOTE — ED Notes (Signed)
Dr Lozier at bedside  

## 2014-01-10 NOTE — ED Notes (Signed)
Spoke to Dr.Lozier about delay; pt concerned about transportation home if discharged after caregiver leaves. Dr.Lozier to speak with attending MD about further plan of care options.

## 2014-01-10 NOTE — ED Notes (Signed)
Explained to pt that he cannot have anything to eat due to doppler of his left leg.

## 2014-01-10 NOTE — ED Provider Notes (Signed)
ESRD. Sent here by wound care for LLE wound near calf and ankle. Please refer to the initial full history for physical for full details. ?thrombosis of fem pop graft. Left foot is cool to touch. Arterial doppler study pending - If negative can be dc'd home.   Doppler not conclusive for arterial occlusion. DP pulse easily found on doppler. Spoke to Dr. Scot Dock with vascular surgery about the case. Likely chronic occlusion and can be seen in clinic. Has not started his Bactrim as he did not have the rx. Rx given. No evidence of SIRS based on VS and labs. Doubt systemic illness at this point.  Strong return precautions given for worsening symptoms or any other alarming or concerning symptoms or issues. Call for an appointment with vascular surgeryThe patient was in agreement with the treatment plan and I answered all of their questions. The patient was stable for dc. At dc, the patient ambulated without difficulty, was moving all four extremities, symptoms improved, NAD. and AOx4 Care discussed with my attending, Dr. Reather Converse. If performed and available, imaging studies and labs reviewed.   Kelby Aline, MD 01/11/14 0111

## 2014-01-10 NOTE — ED Notes (Signed)
Pt resting on assessment; open, red wound noted to L calf

## 2014-01-10 NOTE — ED Notes (Signed)
L calf wrapped with nonadherent kling

## 2014-01-10 NOTE — Progress Notes (Signed)
*  PRELIMINARY RESULTS* Vascular Ultrasound ABI and Left lower extremity venous duplex has been completed.  Preliminary findings: Left: No evidence of DVT, superficial thrombosis, or Baker's cyst. Left: ABI: Severely dampened DP waveform. Unable to obtain PTA and Pero A waveforms. Pressure not accurate due to calcified vessels.   Julian [name] FRANCES 01/10/2014, 5:34 PM

## 2014-01-10 NOTE — ED Notes (Signed)
Pt here from MD office with c/o left lower leg wound looking worse than usual , pt states that the wound has been draining some , the wound is black in color with redness around it

## 2014-01-10 NOTE — ED Notes (Signed)
Caregiver reports pt had "watery blisters appear" about two weeks ago. No blisters noted on assessment. Red and black tissue noted to L calf. Pt able to lift leg from bed and sensation intact.

## 2014-01-11 ENCOUNTER — Telehealth: Payer: Self-pay | Admitting: Vascular Surgery

## 2014-01-11 NOTE — Telephone Encounter (Signed)
Message copied by Gena Fray on Fri Jan 11, 2014  4:23 PM ------      Message from: Denman George      Created: Fri Jan 11, 2014  4:10 PM      Regarding: RE: follow up ?      Contact: [phone]       Bring to office for either CSD or SN next week;  I don't think ABI's need repeated since pt. had them at [facility] 9/3            ----- Message -----         From: Gena Fray         Sent: 01/11/2014   1:20 PM           To: Lynetta Mare Pullins, RN      Subject: follow up ?                                              I received a call from Mr Jelley aid, Madelin Headings. She stated that Mr Hollars went to the ER last night at the direction of the [facility] wound center, and he was started on Bactrim. He has an ulcer that has "black tissue" and diminished pulses were noted.             Danae Chen states that the ER told them to call and make an appt ASAP with Dr Scot Dock. Do I need to work him in on 09/09, and if so, does he need any additional testing?            Thanks,      Hinton Dyer       ------

## 2014-01-11 NOTE — ED Provider Notes (Signed)
I agree with plan, acute on chronic, vascular surgery consulted, po abx outpatient fup.   Mariea Clonts   Mariea Clonts, MD 01/11/14 [phone]

## 2014-01-11 NOTE — Telephone Encounter (Signed)
Spoke with pts aide, Erica to schedule appointment, dpm

## 2014-01-11 NOTE — ED Provider Notes (Signed)
Medical screening examination/treatment/procedure(s) were conducted as a shared visit with non-physician practitioner(s) and myself.  I personally evaluated the patient during the encounter.   EKG Interpretation None      Patient presents with left lower extremity wounds from his wound care doctor with concerns for ischemic limb.  Patient has had recent hospitalizations for cellulitis and chronic wounds the left lower extremity. He was just discharged several days ago. He followed up with a wound care specialist today who is concerned that he may have thrombosed his femoropopliteal graft. Patient noted to have blistering over the calf and distal lower extremity left foot. Dopplerable pulses strong. The foot is cool but appears perfused. No obvious evidence of gangrene. Basic labwork obtained. Lactate normal. No evidence of leukocytosis. Patient is on dialysis and does still make some urine. Discussed with radiology who recommends obtaining a arterial Doppler. This was ordered.  Patient signed out.  Merryl Hacker, MD 01/11/14 [phone]

## 2014-01-15 ENCOUNTER — Encounter: Payer: Self-pay | Admitting: Family

## 2014-01-16 ENCOUNTER — Encounter: Payer: Self-pay | Admitting: Vascular Surgery

## 2014-01-16 ENCOUNTER — Inpatient Hospital Stay: Payer: PRIVATE HEALTH INSURANCE | Admitting: Family Medicine

## 2014-01-16 ENCOUNTER — Encounter: Payer: Self-pay | Admitting: Family

## 2014-01-16 ENCOUNTER — Ambulatory Visit (INDEPENDENT_AMBULATORY_CARE_PROVIDER_SITE_OTHER): Payer: PRIVATE HEALTH INSURANCE | Admitting: Family

## 2014-01-16 VITALS — BP 100/62 | HR 93 | Temp 97.4°F | Resp 16 | Ht 70.0 in | Wt 205.0 lb

## 2014-01-16 DIAGNOSIS — I7025 Atherosclerosis of native arteries of other extremities with ulceration: Secondary | ICD-10-CM | POA: Insufficient documentation

## 2014-01-16 DIAGNOSIS — I739 Peripheral vascular disease, unspecified: Secondary | ICD-10-CM | POA: Insufficient documentation

## 2014-01-16 DIAGNOSIS — M79609 Pain in unspecified limb: Secondary | ICD-10-CM

## 2014-01-16 DIAGNOSIS — M7989 Other specified soft tissue disorders: Secondary | ICD-10-CM

## 2014-01-16 DIAGNOSIS — L819 Disorder of pigmentation, unspecified: Secondary | ICD-10-CM

## 2014-01-16 DIAGNOSIS — M79605 Pain in left leg: Secondary | ICD-10-CM | POA: Insufficient documentation

## 2014-01-16 DIAGNOSIS — L98499 Non-pressure chronic ulcer of skin of other sites with unspecified severity: Secondary | ICD-10-CM

## 2014-01-16 NOTE — Patient Instructions (Signed)
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
## 2014-01-17 ENCOUNTER — Ambulatory Visit (INDEPENDENT_AMBULATORY_CARE_PROVIDER_SITE_OTHER): Payer: PRIVATE HEALTH INSURANCE | Admitting: Vascular Surgery

## 2014-01-17 ENCOUNTER — Ambulatory Visit (HOSPITAL_COMMUNITY)
Admission: RE | Admit: 2014-01-17 | Discharge: 2014-01-17 | Disposition: A | Discharge status: Home / Self Care | Payer: PRIVATE HEALTH INSURANCE | Source: Ambulatory Visit | Attending: Vascular Surgery | Admitting: Vascular Surgery

## 2014-01-17 VITALS — BP 129/49 | HR 95 | Temp 98.1°F | Resp 16 | Ht 70.0 in | Wt 205.0 lb

## 2014-01-17 DIAGNOSIS — M79609 Pain in unspecified limb: Secondary | ICD-10-CM

## 2014-01-17 DIAGNOSIS — I739 Peripheral vascular disease, unspecified: Secondary | ICD-10-CM

## 2014-01-17 DIAGNOSIS — I82819 Embolism and thrombosis of superficial veins of unspecified lower extremities: Secondary | ICD-10-CM | POA: Diagnosis not present

## 2014-01-17 DIAGNOSIS — M7989 Other specified soft tissue disorders: Secondary | ICD-10-CM

## 2014-01-17 DIAGNOSIS — I872 Venous insufficiency (chronic) (peripheral): Secondary | ICD-10-CM | POA: Insufficient documentation

## 2014-01-17 DIAGNOSIS — M79605 Pain in left leg: Secondary | ICD-10-CM
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
## 2014-01-22 ENCOUNTER — Encounter: Payer: Self-pay | Admitting: Family Medicine

## 2014-01-22 ENCOUNTER — Ambulatory Visit (INDEPENDENT_AMBULATORY_CARE_PROVIDER_SITE_OTHER): Payer: PRIVATE HEALTH INSURANCE | Admitting: Family Medicine

## 2014-01-22 VITALS — BP 134/74 | HR 68 | Temp 98.3°F | Resp 18

## 2014-01-22 DIAGNOSIS — R059 Cough, unspecified: Secondary | ICD-10-CM

## 2014-01-22 DIAGNOSIS — Z23 Encounter for immunization: Secondary | ICD-10-CM

## 2014-01-22 DIAGNOSIS — L98499 Non-pressure chronic ulcer of skin of other sites with unspecified severity: Secondary | ICD-10-CM

## 2014-01-22 DIAGNOSIS — R05 Cough: Secondary | ICD-10-CM | POA: Insufficient documentation

## 2014-01-22 DIAGNOSIS — S78119A Complete traumatic amputation at level between unspecified hip and knee, initial encounter: Secondary | ICD-10-CM

## 2014-01-22 DIAGNOSIS — I739 Peripheral vascular disease, unspecified: Secondary | ICD-10-CM

## 2014-01-22 DIAGNOSIS — Z89611 Acquired absence of right leg above knee: Secondary | ICD-10-CM

## 2014-01-22 MED ORDER — BENZONATATE 100 MG PO CAPS: 100.0000 mg | ORAL_CAPSULE | Freq: Three times a day (TID) | ORAL | Status: DC | PRN

## 2014-01-22 MED ORDER — OXYCODONE-ACETAMINOPHEN 10-325 MG PO TABS: 1.0000 | ORAL_TABLET | Freq: Two times a day (BID) | ORAL | Status: DC | PRN

## 2014-01-22 NOTE — Patient Instructions (Signed)
Take the cough medicine as prescribed Flu shot given  Hospital Bed, Recliner Chair  Pain prescription given today  F/U 3 months

## 2014-01-22 NOTE — Progress Notes (Addendum)
Patient ID: Julian [name], male   DOB: [birth year], 69 y.o.   MRN: [id number]   Subjective:    Patient ID: Julian [name], male    DOB: [birth year], 68 y.o.   MRN: [id number]  Patient presents for Hospital F/U and Cough  patient here for hospital followup. He was admitted secondary to cellulitis of his left lower Cervone. He was seen on 2 occasions back in early August secondary to rupture water blisters that became infected he was on the Rx for this. The lesion would not heal therefore he was admitted he was sent to the wound clinic who then referred her to vascular surgery arterial study showed that he had very poor blood supply to his left leg and that if the lesions did not heal he would likely need an amputation is nothing further could be done. He was completing his 10 day course of Bactrim currently. He does have a new small blister which is been covered with gauze in his legs are being wrapped to prevent swelling which is working.  He's had a dry cough for the past couple weeks she did have a chest x-ray done about 3 weeks ago which is when he did have a cough and this was negative. His Aide states that one of the times when he was sleeping it sounds like he might been wheezing he denies any chest pain shortness of breath. He thinks that the cough comes when he eats too much ice  Patient also requests a hospital bed as he is having difficulty transferring to his bed at home this will also help prevent falls as it can be changed to a chair position. He also requests a recliner chair for his living room  Review Of Systems:  GEN- denies fatigue, fever, weight loss,weakness, recent illness HEENT- denies eye drainage, change in vision, nasal discharge, CVS- denies chest pain, palpitations RESP- denies SOB,+ cough, wheeze ABD- denies N/V, change in stools, abd pain GU- denies dysuria, hematuria, dribbling, incontinence MSK- denies joint pain, muscle aches, injury Neuro- denies headache,  dizziness, syncope, seizure activity       Objective:    BP 134/74  Pulse 68  Temp(Src) 98.3 F (36.8 C) (Oral)  Resp 18 GEN- NAD, alert and oriented x3 HEENT- PERRL, EOMI, non injected sclera, pink conjunctiva, MMM, oropharynx clear CVS- RRR, no murmur RESP-CTAB Skin- multiple ulcerations left leg large 4cm ulcer of calf, 2cm ulcer mid tibia, small 1cm blister beneath this with an abrasion, NT, no drainage, no odor  EXT- + chronic venous statis/ mild pedal  edema LLE Pulses- Radial 2+, DP unable to palpate       Assessment & Plan:      Problem List Items Addressed This Visit   Cough - Primary     Prescribed Tessalon Perles for the cough. I do not see any evidence of any infection on exam he does not have any history of any asthma or COPD. His chest x-ray was also benign. 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 He'll continue with compression to help with edema. This is at work at his next at this amputation of his left leg     Other Visit Diagnoses  Need for prophylactic vaccination and inoculation against influenza        Relevant Orders       Flu Vaccine QUAD 36+ mos PF IM (Fluarix Quad PF) (Completed)       Note: This dictation was prepared with Dragon dictation along with smaller phrase technology. Any transcriptional errors that result from this process are unintentional.

## 2014-01-22 NOTE — Assessment & Plan Note (Signed)
Concern for arterial ulcers of his left leg unfortunately nothing else can be done at this time complete antibiotics I do not see any evidence of gross infection today. He'll continue with compression to help with edema. This is at work at his next at this amputation of his left leg

## 2014-01-22 NOTE — Assessment & Plan Note (Signed)
Prescribed Tessalon Perles for the cough. I do not see any evidence of any infection on exam he does not have any history of any asthma or COPD. His chest x-ray was also benign. Other possibilities are allergies or acid reflux as a cause of the cough

## 2014-01-23 NOTE — Assessment & Plan Note (Signed)
I think he would benefit from a hospital bed which is able to put him in different positions to aid in transfer also to decrease fall risk at night when he tried to get up to the restroom. The bed will help with left leg pain from blistering wounds that he has and to help with positioning to decrease wounds.   Regarding the recliner this will help during the daytime said he's not have to be in a sexual bed with his leg swelling

## 2014-01-25 ENCOUNTER — Telehealth: Payer: Self-pay | Admitting: Family Medicine

## 2014-01-25 NOTE — Telephone Encounter (Signed)
Patient's aid Doroteo Bradford is calling to let us know that the tessalon pearls are not covered by insurance, and would like to know if we could please call in something else for him if possible  [phone]

## 2014-01-25 NOTE — Telephone Encounter (Signed)
MD please advise

## 2014-01-25 NOTE — Telephone Encounter (Signed)
Call placed to patient and patient made aware.  

## 2014-01-25 NOTE — Telephone Encounter (Signed)
If tessalon not covered tell them to get RObitussin DM because of his pain meds he can get any other cough syrup with codiene

## 2014-01-27 ENCOUNTER — Encounter (HOSPITAL_COMMUNITY): Payer: Self-pay | Admitting: Emergency Medicine

## 2014-01-27 ENCOUNTER — Emergency Department (HOSPITAL_COMMUNITY)
Admission: EM | Admit: 2014-01-27 | Discharge: 2014-01-27 | Disposition: A | Discharge status: Home / Self Care | Payer: PRIVATE HEALTH INSURANCE | Attending: Emergency Medicine | Admitting: Emergency Medicine

## 2014-01-27 DIAGNOSIS — Z7982 Long term (current) use of aspirin: Secondary | ICD-10-CM | POA: Diagnosis not present

## 2014-01-27 DIAGNOSIS — E11319 Type 2 diabetes mellitus with unspecified diabetic retinopathy without macular edema: Secondary | ICD-10-CM | POA: Diagnosis not present

## 2014-01-27 DIAGNOSIS — G8929 Other chronic pain: Secondary | ICD-10-CM | POA: Diagnosis not present

## 2014-01-27 DIAGNOSIS — Z79899 Other long term (current) drug therapy: Secondary | ICD-10-CM | POA: Diagnosis not present

## 2014-01-27 DIAGNOSIS — N186 End stage renal disease: Secondary | ICD-10-CM | POA: Insufficient documentation

## 2014-01-27 DIAGNOSIS — M79609 Pain in unspecified limb: Secondary | ICD-10-CM | POA: Insufficient documentation

## 2014-01-27 DIAGNOSIS — Z862 Personal history of diseases of the blood and blood-forming organs and certain disorders involving the immune mechanism: Secondary | ICD-10-CM | POA: Insufficient documentation

## 2014-01-27 DIAGNOSIS — Z992 Dependence on renal dialysis: Secondary | ICD-10-CM | POA: Insufficient documentation

## 2014-01-27 DIAGNOSIS — I120 Hypertensive chronic kidney disease with stage 5 chronic kidney disease or end stage renal disease: Secondary | ICD-10-CM | POA: Diagnosis not present

## 2014-01-27 DIAGNOSIS — E119 Type 2 diabetes mellitus without complications: Secondary | ICD-10-CM | POA: Insufficient documentation

## 2014-01-27 DIAGNOSIS — E1139 Type 2 diabetes mellitus with other diabetic ophthalmic complication: Secondary | ICD-10-CM | POA: Insufficient documentation

## 2014-01-27 DIAGNOSIS — E669 Obesity, unspecified: Secondary | ICD-10-CM | POA: Insufficient documentation

## 2014-01-27 DIAGNOSIS — Z8619 Personal history of other infectious and parasitic diseases: Secondary | ICD-10-CM | POA: Insufficient documentation

## 2014-01-27 DIAGNOSIS — G8918 Other acute postprocedural pain: Secondary | ICD-10-CM | POA: Insufficient documentation

## 2014-01-27 DIAGNOSIS — I2510 Atherosclerotic heart disease of native coronary artery without angina pectoris: Secondary | ICD-10-CM | POA: Insufficient documentation

## 2014-01-27 DIAGNOSIS — M79662 Pain in left lower leg: Secondary | ICD-10-CM

## 2014-01-27 MED ORDER — OXYCODONE-ACETAMINOPHEN 5-325 MG PO TABS
2.0000 | ORAL_TABLET | Freq: Once | ORAL | Status: AC
Start: 2014-01-27 — End: 2014-01-27
  Administered 2014-01-27: 2 via ORAL
  Filled 2014-01-27: qty 2

## 2014-01-27 MED ORDER — OXYCODONE-ACETAMINOPHEN 10-325 MG PO TABS: 1.0000 | ORAL_TABLET | Freq: Four times a day (QID) | ORAL | Status: DC | PRN

## 2014-01-27 NOTE — ED Notes (Signed)
Patient has wound on R leg/foot that has been causing pain.  He gets his medications shipped on the 25th of each month, but has had to take extra pain medication d/t leg wound, so he is out of pain medication.  Pain is keeping him from getting any sleep.  Nurse that takes care of him states wounds on L leg looks bad and should be evaluated.

## 2014-01-27 NOTE — Discharge Instructions (Signed)
Please call your doctor for a followup appointment within 24-48 hours. When you talk to your doctor please let them know that you were seen in the emergency department and have them acquire all of your records so that they can discuss the findings with you and formulate a treatment plan to fully care for your new and ongoing problems.  Do not take more medication than prescribed    Acetaminophen; Oxycodone tablets or PERCOCET  This is a pain reliever. It is used to treat mild to moderate pain.  This medicine may be used for other purposes; ask your health care provider or pharmacist if you have questions.  What should I tell my health care provider before I take this medicine?  They need to know if you have any of these conditions:  -brain tumor  -Crohn's disease, inflammatory bowel disease, or ulcerative colitis  -drink more than 3 alcohol containing drinks per day  -drug abuse or addiction  -head injury  -heart or circulation problems  -kidney disease or problems going to the bathroom  -liver disease  -lung disease, asthma, or breathing problems  -an unusual or allergic reaction to acetaminophen, oxycodone, other opioid analgesics, other medicines, foods, dyes, or preservatives  -pregnant or trying to get pregnant  -breast-feeding  How should I use this medicine?  Take this medicine by mouth with a full glass of water. Follow the directions on the prescription label. Take your medicine at regular intervals. Do not take your medicine more often than directed.  Talk to your pediatrician regarding the use of this medicine in children. Special care may be needed.  Patients over 70 years old may have a stronger reaction and need a smaller dose.  Overdosage: If you think you have taken too much of this medicine contact a poison control center or emergency room at once.  NOTE: This medicine is only for you. Do not share this medicine with others.   What may interact with this medicine?    -alcohol or medicines that contain alcohol  -antihistamines  -barbiturates like amobarbital, butalbital, butabarbital, methohexital, pentobarbital, phenobarbital, thiopental, and secobarbital  -benztropine  -drugs for bladder problems like solifenacin, trospium, oxybutynin, tolterodine, hyoscyamine, and methscopolamine  -drugs for breathing problems like ipratropium and tiotropium  -drugs for certain stomach or intestine problems like propantheline, homatropine methylbromide, glycopyrrolate, atropine, belladonna, and dicyclomine  -general anesthetics like etomidate, ketamine, nitrous oxide, propofol, desflurane, enflurane, halothane, isoflurane, and sevoflurane  -medicines for depression, anxiety, or psychotic disturbances  -medicines for pain like codeine, morphine, pentazocine, buprenorphine, butorphanol, nalbuphine, tramadol, and propoxyphene  -medicines for sleep  -muscle relaxants  -naltrexone  -phenothiazines like perphenazine, thioridazine, chlorpromazine, mesoridazine, fluphenazine, prochlorperazine, promazine, and trifluoperazine  -scopolamine  -trihexyphenidyl  This list may not describe all possible interactions. Give your health care provider a list of all the medicines, herbs, non-prescription drugs, or dietary supplements you use. Also tell them if you smoke, drink alcohol, or use illegal drugs. Some items may interact with your medicine.  What should I watch for while using this medicine?  Tell your doctor or health care professional if your pain does not go away, if it gets worse, or if you have new or a different type of pain Do not suddenly stop taking your medicine because you may develop a severe reaction. Your body becomes used to the medicine. This does NOT mean you are addicted. Addiction is a behavior related to getting and using a drug for a nonmedical reason.  You may get drowsy  or dizzy. Do not drive, use machinery, or do anything that needs mental alertness until  you know how this medicine affects you. Do not stand or sit up quickly, especially if you are an older patient. This reduces the risk of dizzy or fainting spells. Alcohol may interfere with the effect of this medicine. Avoid alcoholic drinks.  The medicine will cause constipation. Try to have a bowel movement at least every 2 to 3 days. If you do not have a bowel movement for 3 days, call your doctor or health care professional.  Do not take Tylenol (acetaminophen) or medicines that have acetaminophen with this medicine. Too much acetaminophen can be very dangerous. Many nonprescription medicines contain acetaminophen. Always read the labels carefully to avoid taking more acetaminophen.   What side effects may I notice from receiving this medicine?  Side effects that you should report to your doctor or health care professional as soon as possible:  -allergic reactions like skin rash, itching or hives, swelling of the face, lips, or tongue  -breathing difficulties, wheezing  -confusion  -light headedness or fainting spells  -severe stomach pain  -yellowing of the skin or the whites of the eyes  Side effects that usually do not require medical attention (report to your doctor or health care professional if they continue or are bothersome):  -dizziness  -drowsiness  -nausea  -vomiting  This list may not describe all possible side effects. Call your doctor for medical advice about side effects. You may report side effects to FDA at 1-800-FDA-1088.  Where should I keep my medicine?  Keep out of the reach of children. This medicine can be abused. Keep your medicine in a safe place to protect it from theft. Do not share this medicine with anyone. Selling or giving away this medicine is dangerous and against the law.   NOTE: This sheet is a summary. It may not cover all possible information. If you have questions about this medicine, talk to your doctor, pharmacist, or health care provider.
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
## 2014-02-14 ENCOUNTER — Telehealth: Payer: Self-pay | Admitting: *Deleted

## 2014-02-14 NOTE — Telephone Encounter (Signed)
Received a call from Madelin Headings in reference to above pt, Ms. Julian [name] stated that he just got back from his wound doctor with the open sores on his foot and leg, and the wound doctor is talking about him having to do amputation above knee and wants a 2nd opinion and would like a referral to [facility] Vascular Surgeon there.

## 2014-02-15 ENCOUNTER — Telehealth: Payer: Self-pay

## 2014-02-15 NOTE — Telephone Encounter (Signed)
Patient caretaker was calling to check on referral for the vascular doctor please call him back at [phone]

## 2014-02-15 NOTE — Telephone Encounter (Signed)
Please speak to the patient directly not his Aide, see if he wants referral for second opinion I can not do this without his consent

## 2014-02-15 NOTE — Telephone Encounter (Signed)
Okay to place referral to [facility] vascular  Use DX Code-  PAD,  Atherosclerosis with ulceration of leg

## 2014-02-15 NOTE — Telephone Encounter (Signed)
Spoke with patients aide Danae Chen to schedule for 02/21/2014 @ 1:30pm. Dpm

## 2014-02-15 NOTE — Telephone Encounter (Signed)
Phone call from pt's [facility] aide.  Reported that pt. Has 3 new open sores on left lower leg; draining reddish-brown drainage.  Stated the "sores have a white pigmentation, as if pt. was soaking in bath tub for a prolonged time."   Stated the sores are on left great toe, left shin area, and left heel.  Reported the previous sores that had been open are now "black" in color.  Advised pt's [facility] Aide that in progress note from last office visit on 01/17/14, it was documented that pt. 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  Danae Chen, [place] informed pt. Of this, and the pt. Requested an appt. To speak to Dr. Oneida Alar about his left leg.  Advised appt. will be scheduled and pt. to be contacted.

## 2014-02-15 NOTE — Telephone Encounter (Signed)
Called and spoke to Julian [name] and he stated he does want a second opionion because he does not want to lose his leg if he can help it.

## 2014-02-18 ENCOUNTER — Inpatient Hospital Stay (HOSPITAL_COMMUNITY)
Admission: EM | Admit: 2014-02-18 | Discharge: 2014-03-01 | DRG: 853 | Disposition: A | Discharge status: Skilled Nursing Facility | Payer: PRIVATE HEALTH INSURANCE | Attending: Internal Medicine | Admitting: Internal Medicine

## 2014-02-18 ENCOUNTER — Encounter (HOSPITAL_COMMUNITY): Payer: Self-pay | Admitting: Emergency Medicine

## 2014-02-18 ENCOUNTER — Other Ambulatory Visit: Payer: Self-pay | Admitting: Family Medicine

## 2014-02-18 ENCOUNTER — Emergency Department (HOSPITAL_COMMUNITY): Payer: PRIVATE HEALTH INSURANCE

## 2014-02-18 DIAGNOSIS — Z6834 Body mass index (BMI) 34.0-34.9, adult: Secondary | ICD-10-CM

## 2014-02-18 DIAGNOSIS — Z79899 Other long term (current) drug therapy: Secondary | ICD-10-CM | POA: Diagnosis not present

## 2014-02-18 DIAGNOSIS — N2581 Secondary hyperparathyroidism of renal origin: Secondary | ICD-10-CM | POA: Diagnosis present

## 2014-02-18 DIAGNOSIS — I120 Hypertensive chronic kidney disease with stage 5 chronic kidney disease or end stage renal disease: Secondary | ICD-10-CM | POA: Diagnosis present

## 2014-02-18 DIAGNOSIS — A419 Sepsis, unspecified organism: Principal | ICD-10-CM

## 2014-02-18 DIAGNOSIS — Z7982 Long term (current) use of aspirin: Secondary | ICD-10-CM | POA: Diagnosis not present

## 2014-02-18 DIAGNOSIS — E1142 Type 2 diabetes mellitus with diabetic polyneuropathy: Secondary | ICD-10-CM | POA: Diagnosis present

## 2014-02-18 DIAGNOSIS — E872 Acidosis, unspecified: Secondary | ICD-10-CM

## 2014-02-18 DIAGNOSIS — I252 Old myocardial infarction: Secondary | ICD-10-CM

## 2014-02-18 DIAGNOSIS — I9589 Other hypotension: Secondary | ICD-10-CM

## 2014-02-18 DIAGNOSIS — E1122 Type 2 diabetes mellitus with diabetic chronic kidney disease: Secondary | ICD-10-CM

## 2014-02-18 DIAGNOSIS — L03119 Cellulitis of unspecified part of limb: Secondary | ICD-10-CM | POA: Insufficient documentation

## 2014-02-18 DIAGNOSIS — Z89612 Acquired absence of left leg above knee: Secondary | ICD-10-CM

## 2014-02-18 DIAGNOSIS — E780 Pure hypercholesterolemia: Secondary | ICD-10-CM | POA: Diagnosis present

## 2014-02-18 DIAGNOSIS — I739 Peripheral vascular disease, unspecified: Secondary | ICD-10-CM

## 2014-02-18 DIAGNOSIS — Z955 Presence of coronary angioplasty implant and graft: Secondary | ICD-10-CM | POA: Diagnosis not present

## 2014-02-18 DIAGNOSIS — I872 Venous insufficiency (chronic) (peripheral): Secondary | ICD-10-CM | POA: Diagnosis present

## 2014-02-18 DIAGNOSIS — R509 Fever, unspecified: Secondary | ICD-10-CM | POA: Diagnosis present

## 2014-02-18 DIAGNOSIS — E861 Hypovolemia: Secondary | ICD-10-CM | POA: Diagnosis present

## 2014-02-18 DIAGNOSIS — I10 Essential (primary) hypertension: Secondary | ICD-10-CM

## 2014-02-18 DIAGNOSIS — L03116 Cellulitis of left lower limb: Secondary | ICD-10-CM

## 2014-02-18 DIAGNOSIS — D631 Anemia in chronic kidney disease: Secondary | ICD-10-CM | POA: Diagnosis present

## 2014-02-18 DIAGNOSIS — L97529 Non-pressure chronic ulcer of other part of left foot with unspecified severity: Secondary | ICD-10-CM

## 2014-02-18 DIAGNOSIS — Z89611 Acquired absence of right leg above knee: Secondary | ICD-10-CM

## 2014-02-18 DIAGNOSIS — I255 Ischemic cardiomyopathy: Secondary | ICD-10-CM | POA: Diagnosis present

## 2014-02-18 DIAGNOSIS — Z992 Dependence on renal dialysis: Secondary | ICD-10-CM | POA: Diagnosis not present

## 2014-02-18 DIAGNOSIS — E669 Obesity, unspecified: Secondary | ICD-10-CM | POA: Diagnosis present

## 2014-02-18 DIAGNOSIS — L02419 Cutaneous abscess of limb, unspecified: Secondary | ICD-10-CM | POA: Insufficient documentation

## 2014-02-18 DIAGNOSIS — N186 End stage renal disease: Secondary | ICD-10-CM | POA: Diagnosis present

## 2014-02-18 DIAGNOSIS — I2510 Atherosclerotic heart disease of native coronary artery without angina pectoris: Secondary | ICD-10-CM

## 2014-02-18 DIAGNOSIS — N189 Chronic kidney disease, unspecified: Secondary | ICD-10-CM

## 2014-02-18 DIAGNOSIS — E11319 Type 2 diabetes mellitus with unspecified diabetic retinopathy without macular edema: Secondary | ICD-10-CM | POA: Diagnosis present

## 2014-02-18 DIAGNOSIS — I96 Gangrene, not elsewhere classified: Secondary | ICD-10-CM

## 2014-02-18 DIAGNOSIS — E43 Unspecified severe protein-calorie malnutrition: Secondary | ICD-10-CM | POA: Diagnosis present

## 2014-02-18 DIAGNOSIS — I70249 Atherosclerosis of native arteries of left leg with ulceration of unspecified site: Secondary | ICD-10-CM

## 2014-02-18 DIAGNOSIS — E118 Type 2 diabetes mellitus with unspecified complications: Secondary | ICD-10-CM

## 2014-02-18 DIAGNOSIS — I959 Hypotension, unspecified: Secondary | ICD-10-CM | POA: Diagnosis present

## 2014-02-18 HISTORY — DX: Gangrene, not elsewhere classified: I96

## 2014-02-18 LAB — COMPREHENSIVE METABOLIC PANEL
ALK PHOS: 78 U/L (ref 39–117)
ALT: 24 U/L (ref 0–53)
AST: 24 U/L (ref 0–37)
Albumin: 2.3 g/dL — ABNORMAL LOW (ref 3.5–5.2)
Anion gap: 21 — ABNORMAL HIGH (ref 5–15)
BILIRUBIN TOTAL: 0.3 mg/dL (ref 0.3–1.2)
BUN: 37 mg/dL — ABNORMAL HIGH (ref 6–23)
CHLORIDE: 92 meq/L — AB (ref 96–112)
CO2: 23 mEq/L (ref 19–32)
Calcium: 9 mg/dL (ref 8.4–10.5)
Creatinine, Ser: 8.1 mg/dL — ABNORMAL HIGH (ref 0.50–1.35)
GFR calc Af Amer: 7 mL/min — ABNORMAL LOW (ref >90)
GFR calc non Af Amer: 6 mL/min — ABNORMAL LOW (ref >90)
Glucose, Bld: 153 mg/dL — ABNORMAL HIGH (ref 70–99)
Potassium: 4.4 mEq/L (ref 3.7–5.3)
SODIUM: 136 meq/L — AB (ref 137–147)
TOTAL PROTEIN: 7.1 g/dL (ref 6.0–8.3)

## 2014-02-18 LAB — BLOOD GAS, VENOUS
Acid-base deficit: 2.4 mmol/L — ABNORMAL HIGH (ref 0.0–2.0)
Bicarbonate: 20.6 mEq/L (ref 20.0–24.0)
Drawn by: 105551
O2 Saturation: 97.6 %
PCO2 VEN: 28.2 mmHg — AB (ref 45.0–50.0)
PH VEN: 7.476 — AB (ref 7.250–7.300)
PO2 VEN: 94 mmHg — AB (ref 30.0–45.0)
Patient temperature: 37
TCO2: 19.3 mmol/L (ref 0–100)

## 2014-02-18 LAB — CBC WITH DIFFERENTIAL/PLATELET
BASOS PCT: 0 % (ref 0–1)
Basophils Absolute: 0 10*3/uL (ref 0.0–0.1)
EOS ABS: 0.1 10*3/uL (ref 0.0–0.7)
Eosinophils Relative: 1 % (ref 0–5)
HCT: 25.7 % — ABNORMAL LOW (ref 39.0–52.0)
HEMOGLOBIN: 8.3 g/dL — AB (ref 13.0–17.0)
Lymphocytes Relative: 11 % — ABNORMAL LOW (ref 12–46)
Lymphs Abs: 1.3 10*3/uL (ref 0.7–4.0)
MCH: 30.7 pg (ref 26.0–34.0)
MCHC: 32.3 g/dL (ref 30.0–36.0)
MCV: 95.2 fL (ref 78.0–100.0)
Monocytes Absolute: 0.9 10*3/uL (ref 0.1–1.0)
Monocytes Relative: 8 % (ref 3–12)
Neutro Abs: 9.3 10*3/uL — ABNORMAL HIGH (ref 1.7–7.7)
Neutrophils Relative %: 80 % — ABNORMAL HIGH (ref 43–77)
PLATELETS: 153 10*3/uL (ref 150–400)
RBC: 2.7 MIL/uL — AB (ref 4.22–5.81)
RDW: 16.1 % — ABNORMAL HIGH (ref 11.5–15.5)
WBC: 11.6 10*3/uL — ABNORMAL HIGH (ref 4.0–10.5)

## 2014-02-18 LAB — BASIC METABOLIC PANEL
Anion gap: 20 — ABNORMAL HIGH (ref 5–15)
BUN: 37 mg/dL — ABNORMAL HIGH (ref 6–23)
CALCIUM: 8.6 mg/dL (ref 8.4–10.5)
CO2: 19 mEq/L (ref 19–32)
Chloride: 94 mEq/L — ABNORMAL LOW (ref 96–112)
Creatinine, Ser: 8.58 mg/dL — ABNORMAL HIGH (ref 0.50–1.35)
GFR, EST AFRICAN AMERICAN: 7 mL/min — AB (ref >90)
GFR, EST NON AFRICAN AMERICAN: 6 mL/min — AB (ref >90)
GLUCOSE: 126 mg/dL — AB (ref 70–99)
POTASSIUM: 4.5 meq/L (ref 3.7–5.3)
SODIUM: 133 meq/L — AB (ref 137–147)

## 2014-02-18 LAB — GLUCOSE, CAPILLARY
GLUCOSE-CAPILLARY: 181 mg/dL — AB (ref 70–99)
GLUCOSE-CAPILLARY: 144 mg/dL — AB (ref 70–99)
Glucose-Capillary: 143 mg/dL — ABNORMAL HIGH (ref 70–99)
Glucose-Capillary: 203 mg/dL — ABNORMAL HIGH (ref 70–99)

## 2014-02-18 LAB — I-STAT CG4 LACTIC ACID, ED: Lactic Acid, Venous: 3.86 mmol/L — ABNORMAL HIGH (ref 0.5–2.2)

## 2014-02-18 LAB — SEDIMENTATION RATE: Sed Rate: 97 mm/hr — ABNORMAL HIGH (ref 0–16)

## 2014-02-18 LAB — APTT: aPTT: 36 seconds (ref 24–37)

## 2014-02-18 LAB — MRSA PCR SCREENING: MRSA by PCR: NEGATIVE

## 2014-02-18 LAB — C-REACTIVE PROTEIN: CRP: 15.3 mg/dL — AB (ref <0.60)

## 2014-02-18 MED ORDER — ONDANSETRON HCL 4 MG/2ML IJ SOLN
4.0000 mg | Freq: Four times a day (QID) | INTRAMUSCULAR | Status: DC | PRN
  Administered 2014-02-24: 4 mg via INTRAVENOUS
  Filled 2014-02-18: qty 2

## 2014-02-18 MED ORDER — RENA-VITE PO TABS
1.0000 | ORAL_TABLET | Freq: Every day | ORAL | Status: DC
  Administered 2014-02-18 – 2014-02-25 (×6): 1 via ORAL
  Filled 2014-02-18 (×12): qty 1

## 2014-02-18 MED ORDER — ASPIRIN EC 81 MG PO TBEC
81.0000 mg | DELAYED_RELEASE_TABLET | Freq: Every day | ORAL | Status: DC
  Administered 2014-02-18 – 2014-02-28 (×11): 81 mg via ORAL
  Filled 2014-02-18 (×12): qty 1

## 2014-02-18 MED ORDER — EPOETIN ALFA 10000 UNIT/ML IJ SOLN
14000.0000 [IU] | INTRAMUSCULAR | Status: DC
  Administered 2014-02-20 – 2014-02-27 (×3): 14000 [IU] via INTRAVENOUS
  Filled 2014-02-18 (×4): qty 2

## 2014-02-18 MED ORDER — PIPERACILLIN-TAZOBACTAM IN DEX 2-0.25 GM/50ML IV SOLN
2.2500 g | Freq: Three times a day (TID) | INTRAVENOUS | Status: DC
  Administered 2014-02-18 – 2014-02-25 (×21): 2.25 g via INTRAVENOUS
  Filled 2014-02-18 (×30): qty 50

## 2014-02-18 MED ORDER — INSULIN ASPART 100 UNIT/ML ~~LOC~~ SOLN
0.0000 [IU] | Freq: Three times a day (TID) | SUBCUTANEOUS | Status: DC
  Administered 2014-02-18: 2 [IU] via SUBCUTANEOUS
  Administered 2014-02-18: 3 [IU] via SUBCUTANEOUS
  Administered 2014-02-19: 2 [IU] via SUBCUTANEOUS
  Administered 2014-02-19: 1 [IU] via SUBCUTANEOUS
  Administered 2014-02-20: 2 [IU] via SUBCUTANEOUS
  Administered 2014-02-21: 1 [IU] via SUBCUTANEOUS
  Administered 2014-02-21 – 2014-02-23 (×3): 2 [IU] via SUBCUTANEOUS
  Administered 2014-02-24: 1 [IU] via SUBCUTANEOUS
  Administered 2014-02-24 – 2014-02-26 (×4): 2 [IU] via SUBCUTANEOUS
  Administered 2014-02-27: 1 [IU] via SUBCUTANEOUS
  Administered 2014-02-28: 2 [IU] via SUBCUTANEOUS
  Administered 2014-03-01 (×2): 1 [IU] via SUBCUTANEOUS

## 2014-02-18 MED ORDER — PIPERACILLIN-TAZOBACTAM 4.5 G IVPB
4.5000 g | Freq: Once | INTRAVENOUS | Status: DC
  Filled 2014-02-18: qty 100

## 2014-02-18 MED ORDER — PIPERACILLIN-TAZOBACTAM IN DEX 2-0.25 GM/50ML IV SOLN
2.2500 g | Freq: Once | INTRAVENOUS | Status: AC
  Administered 2014-02-18: 2.25 g via INTRAVENOUS
  Filled 2014-02-18: qty 50

## 2014-02-18 MED ORDER — SODIUM CHLORIDE 0.9 % IV BOLUS (SEPSIS)
250.0000 mL | Freq: Once | INTRAVENOUS | Status: AC
  Administered 2014-02-18: 250 mL via INTRAVENOUS

## 2014-02-18 MED ORDER — SODIUM CHLORIDE 0.9 % IV SOLN
250.0000 mL | INTRAVENOUS | Status: DC | PRN
  Administered 2014-02-18 (×2): 250 mL via INTRAVENOUS

## 2014-02-18 MED ORDER — VANCOMYCIN HCL IN DEXTROSE 1-5 GM/200ML-% IV SOLN
2000.0000 mg | Freq: Once | INTRAVENOUS | Status: AC
  Administered 2014-02-18: 2000 mg via INTRAVENOUS
  Filled 2014-02-18: qty 400

## 2014-02-18 MED ORDER — PIPERACILLIN-TAZOBACTAM IN DEX 2-0.25 GM/50ML IV SOLN
INTRAVENOUS | Status: AC
  Filled 2014-02-18: qty 50

## 2014-02-18 MED ORDER — CINACALCET HCL 30 MG PO TABS
120.0000 mg | ORAL_TABLET | Freq: Every day | ORAL | Status: DC
  Administered 2014-02-18 – 2014-02-28 (×7): 120 mg via ORAL
  Filled 2014-02-18 (×15): qty 4

## 2014-02-18 MED ORDER — SODIUM CHLORIDE 0.9 % IV SOLN
1000.0000 mL | Freq: Once | INTRAVENOUS | Status: AC
  Administered 2014-02-18: 1000 mL via INTRAVENOUS

## 2014-02-18 MED ORDER — COLLAGENASE 250 UNIT/GM EX OINT
TOPICAL_OINTMENT | Freq: Every day | CUTANEOUS | Status: DC
  Administered 2014-02-18 – 2014-02-22 (×5): via TOPICAL
  Filled 2014-02-18 (×3): qty 30

## 2014-02-18 MED ORDER — HYDROMORPHONE HCL 1 MG/ML IJ SOLN
0.5000 mg | INTRAMUSCULAR | Status: DC | PRN
  Administered 2014-02-18 – 2014-02-24 (×5): 1 mg via INTRAVENOUS
  Filled 2014-02-18 (×5): qty 1

## 2014-02-18 MED ORDER — SEVELAMER CARBONATE 800 MG PO TABS
2400.0000 mg | ORAL_TABLET | Freq: Three times a day (TID) | ORAL | Status: DC
  Administered 2014-02-18 – 2014-03-01 (×15): 2400 mg via ORAL
  Filled 2014-02-18 (×32): qty 3

## 2014-02-18 MED ORDER — ENOXAPARIN SODIUM 30 MG/0.3ML ~~LOC~~ SOLN
30.0000 mg | SUBCUTANEOUS | Status: DC
  Administered 2014-02-18 – 2014-02-28 (×11): 30 mg via SUBCUTANEOUS
  Filled 2014-02-18 (×12): qty 0.3

## 2014-02-18 MED ORDER — ONDANSETRON HCL 4 MG PO TABS: 4.0000 mg | ORAL_TABLET | Freq: Four times a day (QID) | ORAL | Status: DC | PRN

## 2014-02-18 MED ORDER — SODIUM CHLORIDE 0.9 % IV SOLN
1000.0000 mL | INTRAVENOUS | Status: DC
  Administered 2014-02-18: 1000 mL via INTRAVENOUS

## 2014-02-18 MED ORDER — ACETAMINOPHEN 325 MG PO TABS: 650.0000 mg | ORAL_TABLET | Freq: Four times a day (QID) | ORAL | Status: DC | PRN

## 2014-02-18 MED ORDER — SODIUM CHLORIDE 0.9 % IJ SOLN: 3.0000 mL | INTRAMUSCULAR | Status: DC | PRN

## 2014-02-18 MED ORDER — OXYCODONE HCL 5 MG PO TABS
5.0000 mg | ORAL_TABLET | ORAL | Status: DC | PRN
Start: 2014-02-18 — End: 2014-02-24
  Administered 2014-02-22 (×2): 5 mg via ORAL
  Filled 2014-02-18 (×2): qty 1

## 2014-02-18 MED ORDER — SODIUM CHLORIDE 0.9 % IV SOLN
1000.0000 mL | Freq: Once | INTRAVENOUS | Status: AC
Start: 2014-02-18 — End: 2014-02-18
  Administered 2014-02-18: 1000 mL via INTRAVENOUS

## 2014-02-18 MED ORDER — ATORVASTATIN CALCIUM 80 MG PO TABS
80.0000 mg | ORAL_TABLET | Freq: Every evening | ORAL | Status: DC
  Administered 2014-02-21 – 2014-02-26 (×6): 80 mg via ORAL
  Filled 2014-02-18: qty 2
  Filled 2014-02-18: qty 1
  Filled 2014-02-18: qty 2
  Filled 2014-02-18 (×2): qty 1
  Filled 2014-02-18 (×2): qty 2
  Filled 2014-02-18 (×5): qty 1

## 2014-02-18 MED ORDER — VANCOMYCIN HCL IN DEXTROSE 1-5 GM/200ML-% IV SOLN
INTRAVENOUS | Status: AC
  Filled 2014-02-18: qty 200

## 2014-02-18 MED ORDER — VANCOMYCIN HCL IN DEXTROSE 1-5 GM/200ML-% IV SOLN
1000.0000 mg | INTRAVENOUS | Status: DC
  Administered 2014-02-18 – 2014-02-25 (×4): 1000 mg via INTRAVENOUS
  Filled 2014-02-18 (×5): qty 200

## 2014-02-18 MED ORDER — INSULIN ASPART 100 UNIT/ML ~~LOC~~ SOLN: 0.0000 [IU] | Freq: Every day | SUBCUTANEOUS | Status: DC

## 2014-02-18 MED ORDER — SODIUM CHLORIDE 0.9 % IJ SOLN
3.0000 mL | Freq: Two times a day (BID) | INTRAMUSCULAR | Status: DC
  Administered 2014-02-18 – 2014-02-28 (×16): 3 mL via INTRAVENOUS

## 2014-02-18 MED ORDER — ACETAMINOPHEN 650 MG RE SUPP: 650.0000 mg | Freq: Four times a day (QID) | RECTAL | Status: DC | PRN

## 2014-02-18 NOTE — Telephone Encounter (Signed)
Referral place for 2nd opinion to [place]

## 2014-02-18 NOTE — Progress Notes (Signed)
Patient and family teaching given regarding Contact precautions, and infection prevention. All able to "teach back" information.

## 2014-02-18 NOTE — Consult Note (Signed)
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  Measurement: 6 cm x 4 cm x 0.1 cm  Dorsal foot 1 cm x 0.5 cm x 0.1 cm to plantar aspect left great toe. 12 cm x 16 cm ulcer to left lower leg, extends circumferentially with necrotic wound bed.  Wound bed: 90% thin eschar 10% pale pink tissue, nongranulating Drainage (amount, consistency, odor) Moderate serosanguinous drainage. Foul, necrotic odor. Periwound: Scattered non intact lesions Dressing procedure/placement/frequency:Cleanse left lower leg with NS and pat gently dry.  Apply Santyl ointment to nonintact lesions.  Top with NS moist dressing.  Secure with ABD pad, kerlix and tape.  Change daily. Will not follow at this time.  Please re-consult if needed.  Domenic Moras RN BSN [place] Pager [phone]

## 2014-02-18 NOTE — ED Notes (Signed)
Dr. Kathrynn Humble updated on pt BP, in room to evaluate pt. BP up to 90/60.

## 2014-02-18 NOTE — ED Notes (Signed)
Pt to department via EMS.  Pt reporting cough and generally not feeling well.  Due for dialysis on Monday morning.
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
## 2014-02-18 NOTE — Progress Notes (Signed)
UR chart review completed.  

## 2014-02-18 NOTE — Progress Notes (Signed)
Patient seen and examined. Admitted earlier today for LLE ulcers/cellulitis and weakness. Agree with vanc/zosyn. Wounds look pretty necrotic. Arterial dopplers done in 9/15 are fairly normal. Appreciate wound care recommendations. Will need continued wound care in the OP setting. Have consulted renal for his HD (MWF schedule so he is due today). He remains hypotensive. Will give him a small 250cc fluid bolus given his sepsis. Will need to consider SNF at DC if only for aggressive wound care. Will continue to follow closely.  Domingo Mend, MD [name] Pager: [phone]

## 2014-02-18 NOTE — ED Notes (Signed)
Blood cultures drawn prior to antibiotic treatment. Pt is anuric, no urine culture or analysis done. Wound to left lower extremity redressed with non adherent dressing applied to open areas, ABD pads covering larger parts of wound, kurlex dressing wrapped around extremity and secured with tape.

## 2014-02-18 NOTE — Progress Notes (Signed)
ANTIBIOTIC CONSULT NOTE - FOLLOW UP  Pharmacy Consult for Vancomycin & Zosyn Indication: sepsis, PNA, cellulitis  No Known Allergies  Patient Measurements: Height: 5\' 10"  (177.8 cm) Weight: 233 lb 11 oz (106 kg) IBW/kg (Calculated) : 73  Vital Signs: Temp: 98.9 F (37.2 C) (10/12 0524) Temp Source: Oral (10/12 0524) BP: 73/44 mmHg (10/12 0803) Pulse Rate: 76 (10/12 0630) Intake/Output from previous day: 10/11 0701 - 10/12 0700 In: 1469.6 [I.V.:1419.6; IV Piggyback:50] Out: -  Intake/Output from this shift:    Labs:  Recent Labs  02/18/14 0119  WBC 11.6*  HGB 8.3*  PLT 153  CREATININE 8.10*   Estimated Creatinine Clearance: 10.8 ml/min (by C-G formula based on Cr of 8.1). No results found for this basename: VANCOTROUGH, Corlis Leak, VANCORANDOM, GENTTROUGH, GENTPEAK, GENTRANDOM, TOBRATROUGH, TOBRAPEAK, TOBRARND, AMIKACINPEAK, AMIKACINTROU, AMIKACIN,  in the last 72 hours   Microbiology: Recent Results (from the past 720 hour(s))  CULTURE, BLOOD (ROUTINE X 2)     Status: None   Collection Time    02/18/14  1:22 AM      Result Value Ref Range Status   Specimen Description Blood BLOOD LEFT ARM   Final   Special Requests BOTTLES DRAWN AEROBIC ONLY 6CC   Final   Culture NO GROWTH <24 HRS   Final   Report Status PENDING   Incomplete  CULTURE, BLOOD (ROUTINE X 2)     Status: None   Collection Time    02/18/14  1:36 AM      Result Value Ref Range Status   Specimen Description Blood BLOOD LEFT HAND   Final   Special Requests BOTTLES DRAWN AEROBIC ONLY 6CC   Final   Culture NO GROWTH <24 HRS   Final   Report Status PENDING   Incomplete  MRSA PCR SCREENING     Status: None   Collection Time    02/18/14  4:40 AM      Result Value Ref Range Status   MRSA by PCR NEGATIVE  NEGATIVE Final   Comment:            The GeneXpert MRSA Assay (FDA     approved for NASAL specimens     only), is one component of a     comprehensive MRSA colonization     surveillance program. It  is not     intended to diagnose MRSA     infection nor to guide or     monitor treatment for     MRSA infections.    Anti-infectives   Start     Dose/Rate Route Frequency Ordered Stop   02/18/14 1400  piperacillin-tazobactam (ZOSYN) IVPB 2.25 g     2.25 g 100 mL/hr over 30 Minutes Intravenous 3 times per day 02/18/14 0752     02/18/14 0500  piperacillin-tazobactam (ZOSYN) IVPB 2.25 g     2.25 g 100 mL/hr over 30 Minutes Intravenous  Once 02/18/14 0453 02/18/14 0631   02/18/14 0315  vancomycin (VANCOCIN) IVPB 1000 mg/200 mL premix     2,000 mg 400 mL/hr over 60 Minutes Intravenous  Once 02/18/14 0313 02/18/14 0420   02/18/14 0300  piperacillin-tazobactam (ZOSYN) IVPB 4.5 g  Status:  Discontinued     4.5 g 200 mL/hr over 30 Minutes Intravenous  Once 02/18/14 0255 02/18/14 0553      Assessment: 89 yoM with ESRD requiring HD on MWF & hx DM, PVD s/p R AKA.  He presented with cough & not feeling well.   He is afebrile  with mildly elevated WBC and hypotension.  Lactic acid level elevated.  Possible sources of infection include: pneumonia (CXR inconclusive) & LLE wound, cellulitis (Xray negative for osteomyelitis). Cx data pending.   Vancomycin 10/12>> Zosyn 10/12>>  Goal of Therapy:  Eradicate infection. Vancomycin pre-HD level 15-25 mcg/ml  Plan:  Zosyn 2.25gm IV q8h Vancomycin 1000mg  IV qHD (MWF) Check pre-HD level at steady state F/U cx data & patient progress  Biagio Borg 02/18/2014,9:16 AM

## 2014-02-18 NOTE — ED Notes (Signed)
Dressing to pt left foot removed by Dr. Kathrynn Humble. Wound present on arrival. Lower leg discolored, blackened tissue with multiple open areas. No bleeding noted. Foot missing 4 toes, great toe present with open area to top.
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
## 2014-02-19 LAB — CBC
HEMATOCRIT: 25 % — AB (ref 39.0–52.0)
HEMOGLOBIN: 7.9 g/dL — AB (ref 13.0–17.0)
MCH: 30.4 pg (ref 26.0–34.0)
MCHC: 31.6 g/dL (ref 30.0–36.0)
MCV: 96.2 fL (ref 78.0–100.0)
Platelets: 145 10*3/uL — ABNORMAL LOW (ref 150–400)
RBC: 2.6 MIL/uL — ABNORMAL LOW (ref 4.22–5.81)
RDW: 16.2 % — AB (ref 11.5–15.5)
WBC: 8.1 10*3/uL (ref 4.0–10.5)

## 2014-02-19 LAB — BASIC METABOLIC PANEL
BUN: 45 mg/dL — ABNORMAL HIGH (ref 6–23)
CHLORIDE: 95 meq/L — AB (ref 96–112)
CO2: 21 meq/L (ref 19–32)
Calcium: 8 mg/dL — ABNORMAL LOW (ref 8.4–10.5)
Creatinine, Ser: 9.97 mg/dL — ABNORMAL HIGH (ref 0.50–1.35)
GFR calc Af Amer: 5 mL/min — ABNORMAL LOW (ref >90)
GFR calc non Af Amer: 5 mL/min — ABNORMAL LOW (ref >90)
Glucose, Bld: 156 mg/dL — ABNORMAL HIGH (ref 70–99)
Potassium: 4.6 mEq/L (ref 3.7–5.3)
Sodium: 137 mEq/L (ref 137–147)

## 2014-02-19 LAB — HEMOGLOBIN A1C
HEMOGLOBIN A1C: 7.7 % — AB (ref <5.7)
Mean Plasma Glucose: 174 mg/dL — ABNORMAL HIGH (ref <117)

## 2014-02-19 LAB — GLUCOSE, CAPILLARY
GLUCOSE-CAPILLARY: 134 mg/dL — AB (ref 70–99)
GLUCOSE-CAPILLARY: 193 mg/dL — AB (ref 70–99)
GLUCOSE-CAPILLARY: 117 mg/dL — AB (ref 70–99)
Glucose-Capillary: 120 mg/dL — ABNORMAL HIGH (ref 70–99)

## 2014-02-19 LAB — PHOSPHORUS: Phosphorus: 7.6 mg/dL — ABNORMAL HIGH (ref 2.3–4.6)

## 2014-02-19 MED ORDER — LIDOCAINE HCL (PF) 1 % IJ SOLN: 5.0000 mL | INTRAMUSCULAR | Status: DC | PRN

## 2014-02-19 MED ORDER — HEPARIN SODIUM (PORCINE) 1000 UNIT/ML DIALYSIS
1000.0000 [IU] | INTRAMUSCULAR | Status: DC | PRN
  Filled 2014-02-19: qty 1

## 2014-02-19 MED ORDER — LIDOCAINE-PRILOCAINE 2.5-2.5 % EX CREA
1.0000 "application " | TOPICAL_CREAM | CUTANEOUS | Status: DC | PRN
  Filled 2014-02-19: qty 5

## 2014-02-19 MED ORDER — NEPRO/CARBSTEADY PO LIQD: 237.0000 mL | ORAL | Status: DC | PRN

## 2014-02-19 MED ORDER — ALTEPLASE 2 MG IJ SOLR: 2.0000 mg | Freq: Once | INTRAMUSCULAR | Status: AC | PRN

## 2014-02-19 MED ORDER — SODIUM CHLORIDE 0.9 % IV SOLN: 100.0000 mL | INTRAVENOUS | Status: DC | PRN

## 2014-02-19 MED ORDER — PENTAFLUOROPROP-TETRAFLUOROETH EX AERO
1.0000 "application " | INHALATION_SPRAY | CUTANEOUS | Status: DC | PRN
  Filled 2014-02-19: qty 30

## 2014-02-19 NOTE — Progress Notes (Addendum)
Julian [name]  MRN: [id number]  DOB/AGE: March 19, 1946 68 y.o.  Primary Care Physician:[place], Lonell Grandchild, MD  Admit date: 02/18/2014  Chief Complaint:  Chief Complaint  Patient presents with  . Cough    S-Pt presented on  02/18/2014 with  Chief Complaint  Patient presents with  . Cough  .    Pt today feels better  Meds . aspirin EC  81 mg Oral Daily  . atorvastatin  80 mg Oral QPM  . cinacalcet  120 mg Oral Q lunch  . collagenase   Topical Daily  . enoxaparin (LOVENOX) injection  30 mg Subcutaneous Q24H  . epoetin alfa  14,000 Units Intravenous Q M,W,F-HD  . insulin aspart  0-5 Units Subcutaneous QHS  . insulin aspart  0-9 Units Subcutaneous TID WC  . multivitamin  1 tablet Oral QHS  . piperacillin-tazobactam (ZOSYN)  IV  2.25 g Intravenous 3 times per day  . sevelamer carbonate  2,400 mg Oral TID WC  . sodium chloride  3 mL Intravenous Q12H  . vancomycin  1,000 mg Intravenous Q M,W,F-HD        Physical Exam: Vital signs in last 24 hours: Temp:  [97.5 F (36.4 C)-98.7 F (37.1 C)] 97.5 F (36.4 C) (10/13 0400) Pulse Rate:  [75] 75 (10/13 0500) Resp:  [0-21] 8 (10/13 0600) BP: (60-95)/(24-57) 87/30 mmHg (10/13 0600) SpO2:  [97 %-98 %] 98 % (10/13 0400) Weight:  [234 lb 12.6 oz (106.5 kg)] 234 lb 12.6 oz (106.5 kg) (10/13 0500) Weight change: 29 lb 12.6 oz (13.513 kg) Last BM Date: 02/17/14  Intake/Output from previous day: 10/12 0701 - 10/13 0700 In: 209.8 [I.V.:109.8; IV Piggyback:100] Out: -      Physical Exam: General- pt is awake,alert, oriented to time place and person Resp- No acute REsp distress, CTA B/L NO Rhonchi CVS- S1S2 regular in rate and rhythm GIT- BS+, soft, NT, ND EXT- Right AKA, :Left foot in bandage ( foul smelling) Access-Left AVF +  Lab Results: CBC  Recent Labs  02/18/14 0119 02/19/14 0501  WBC 11.6* 8.1  HGB 8.3* 7.9*  HCT 25.7* 25.0*  PLT 153 145*    BMET  Recent Labs  02/18/14 1205 02/19/14 0501  NA 133*  137  K 4.5 4.6  CL 94* 95*  CO2 19 21  GLUCOSE 126* 156*  BUN 37* 45*  CREATININE 8.58* 9.97*  CALCIUM 8.6 8.0*    MICRO Recent Results (from the past 240 hour(s))  CULTURE, BLOOD (ROUTINE X 2)     Status: None   Collection Time    02/18/14  1:22 AM      Result Value Ref Range Status   Specimen Description Blood BLOOD LEFT ARM   Final   Special Requests BOTTLES DRAWN AEROBIC ONLY 6CC   Final   Culture NO GROWTH <24 HRS   Final   Report Status PENDING   Incomplete  CULTURE, BLOOD (ROUTINE X 2)     Status: None   Collection Time    02/18/14  1:36 AM      Result Value Ref Range Status   Specimen Description Blood BLOOD LEFT HAND   Final   Special Requests BOTTLES DRAWN AEROBIC ONLY 6CC   Final   Culture NO GROWTH <24 HRS   Final   Report Status PENDING   Incomplete  MRSA PCR SCREENING     Status: None   Collection Time    02/18/14  4:40 AM      Result Value Ref  Range Status   MRSA by PCR NEGATIVE  NEGATIVE Final   Comment:            The GeneXpert MRSA Assay (FDA     approved for NASAL specimens     only), is one component of a     comprehensive MRSA colonization     surveillance program. It is not     intended to diagnose MRSA     infection nor to guide or     monitor treatment for     MRSA infections.      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   5)ID-admited with sepsis On Vanco+ Zosyn Primary MD following  6) Electrolytes  Normokalemic NOrmonatremic   7)Acid base Co2 at goal     Plan:  Will dialyze  today   Addendum Pt continues to be hypotensive and not willing to do Hd today. Will attempt again in am '    Jayne Peckenpaugh S 02/19/2014, 9:17 AM

## 2014-02-19 NOTE — Care Management Note (Signed)
    Page 1 of 1   02/22/2014     2:04:14 PM CARE MANAGEMENT NOTE 02/22/2014  Patient:  Julian [name], Julian [name]   Account Number:  [id number]  Date Initiated:  02/19/2014  Documentation initiated by:  Theophilus Kinds  Subjective/Objective Assessment:   Pt admitted from home with sepsis. Pt lives alone and wants to return home at discharge. Pt has a CAP aide 7 days a week,6-8 hours a day. Pt has 2 power chairs and walker. Pt receives dialysis at [facility] in [place] and rides the [employer].     Action/Plan:   Pt is aware that he needs rehab for wound care but pt does not want to lose his apartment. Will place [facility] RN in the home. Will continue to follow for discharge planning needs.   Anticipated DC Date:  02/22/2014   Anticipated DC Plan:  [place]  CM consult      Choice offered to / List presented to:             Status of service:  Completed, signed off Medicare Important Message given?   (If response is "NO", the following Medicare IM given date fields will be blank) Date Medicare IM given:   Medicare IM given by:   Date Additional Medicare IM given:   Additional Medicare IM given by:    Discharge Disposition:  ACUTE TO ACUTE TRANS  Per UR Regulation:    If discussed at Long Length of Stay Meetings, dates discussed:    Comments:  02/22/14 [place], RN BSN CM Pt to be transferred to [facility] today for surgery. CM on receiving unit will follow for discharge planning needs.  02/19/14 [place], RN BSN CM
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
## 2014-02-20 ENCOUNTER — Encounter: Payer: Self-pay | Admitting: Vascular Surgery

## 2014-02-20 LAB — CBC
HCT: 25.5 % — ABNORMAL LOW (ref 39.0–52.0)
HEMOGLOBIN: 8.3 g/dL — AB (ref 13.0–17.0)
MCH: 30.6 pg (ref 26.0–34.0)
MCHC: 32.5 g/dL (ref 30.0–36.0)
MCV: 94.1 fL (ref 78.0–100.0)
PLATELETS: 150 10*3/uL (ref 150–400)
RBC: 2.71 MIL/uL — ABNORMAL LOW (ref 4.22–5.81)
RDW: 16.1 % — ABNORMAL HIGH (ref 11.5–15.5)
WBC: 7.9 10*3/uL (ref 4.0–10.5)

## 2014-02-20 LAB — HEPATITIS B SURFACE ANTIGEN: Hepatitis B Surface Ag: NEGATIVE

## 2014-02-20 LAB — GLUCOSE, CAPILLARY
Glucose-Capillary: 104 mg/dL — ABNORMAL HIGH (ref 70–99)
Glucose-Capillary: 195 mg/dL — ABNORMAL HIGH (ref 70–99)
Glucose-Capillary: 139 mg/dL — ABNORMAL HIGH (ref 70–99)

## 2014-02-20 MED ORDER — MIDODRINE HCL 5 MG PO TABS
5.0000 mg | ORAL_TABLET | Freq: Two times a day (BID) | ORAL | Status: DC
  Administered 2014-02-20 – 2014-02-21 (×3): 5 mg via ORAL
  Administered 2014-02-22: 10 mg via ORAL
  Filled 2014-02-20 (×5): qty 1

## 2014-02-20 NOTE — Procedures (Signed)
   HEMODIALYSIS TREATMENT NOTE:  4 hour heparin-free dialysis completed via right upper arm AVG (16g ante/retrograde).  Goal met:  Tolerated removal of 1 liter without interruption in ultrafiltration.  Vancomycin 1g and Epo 14Ku given.  All blood was reinfused.  Report given to Donneta Romberg, RN.  Roshaunda Starkey L. Malala Trenkamp, RN, CDN

## 2014-02-20 NOTE — Clinical Social Work Psychosocial (Signed)
Clinical Social Work Department BRIEF PSYCHOSOCIAL ASSESSMENT 02/20/2014  Patient:  Julian [name], Julian [name]     Account Number:  [id number]     Admit date:  02/18/2014  Clinical Social Worker:  Wyatt Haste  Date/Time:  02/20/2014 02:23 PM  Referred by:  CSW  Date Referred:  02/20/2014 Referred for  SNF Placement   Other Referral:   Interview type:  Patient Other interview type:    PSYCHOSOCIAL DATA Living Status:  ALONE Admitted from facility:   Level of care:   Primary support name:  Julian [name] Primary support relationship to patient:  SIBLING Degree of support available:   supportive per pt    CURRENT CONCERNS Current Concerns  Post-Acute Placement   Other Concerns:    SOCIAL WORK ASSESSMENT / PLAN CSW met with pt at bedside in ICU. Pt alert and oriented and reports he lives alone in an apartment. He describes his sister, Julian [name] as his best support and states she checks on him often. Pt has a CAP aid for 6-8 hours a day Monday through Friday and 2-3 hours on Saturday and Sunday. He receives dialysis on Monday, Wednesday, Friday schedule at 6:00 AM at [facility] in [place]. He states he uses [facility] for transport. Pt said he spends most of his time in his power chair at home. He was able to transfer independently, but due to leg wounds he said he is unable to do so now. Pt has right leg amputation. He reports he is worried about losing left leg as well. Brief support provided. CSW discussed SNF for wound care. Pt is worried about payment and states he cannot lose any of his check. CSW verified with SNF that pt has 20 days covered in full. He is relieved and willing to consider SNF in [place]. CSW provided SNF list and he plans to discuss with his sister this evening.   Assessment/plan status:  Psychosocial Support/Ongoing Assessment of Needs Other assessment/ plan:   Information/referral to community resources:   SNF list    PATIENT'S/FAMILY'S RESPONSE TO PLAN OF CARE: Pt  agreeable to SNF for wound care. CSW will initiate bed search and follow up.       Benay Pike, [place]

## 2014-02-20 NOTE — Clinical Social Work Placement (Addendum)
Clinical Social Work Department CLINICAL SOCIAL WORK PLACEMENT NOTE 02/20/2014  Patient:  RIGGIN, CUTTINO  Account Number:  [id number] Admit date:  02/18/2014  Clinical Social Worker:  Benay Pike, LCSW  Date/time:  02/20/2014 02:22 PM  Clinical Social Work is seeking post-discharge placement for this patient at the following level of care:   SKILLED NURSING   (*CSW will update this form in Epic as items are completed)   02/20/2014  Patient/family provided with [facility] of Clinical Social Work's list of facilities offering this level of care within the geographic area requested by the patient (or if unable, by the patient's family).  02/20/2014  Patient/family informed of their freedom to choose among providers that offer the needed level of care, that participate in Medicare, Medicaid or managed care program needed by the patient, have an available bed and are willing to accept the patient.  02/20/2014  Patient/family informed of [facility]' ownership interest in [facility], as well as of the fact that they are under no obligation to receive care at this facility.  PASARR submitted to EDS on 02/20/2014 PASARR number received on 02/20/2014  FL2 transmitted to all facilities in geographic area requested by pt/family on  02/20/2014 FL2 transmitted to all facilities within larger geographic area on   Patient informed that his/her managed care company has contracts with or will negotiate with  certain facilities, including the following:    Patient/family informed of bed offers received: 02/27/14 and 02/28/14  Patient chooses bed at  Physician recommends and patient chooses bed at    Patient to be transferred to on   Patient to be transferred to facility by  Patient and family notified of transfer on  Name of family member notified:    The following physician request were entered in Epic:  Additional Comments: 02/27/14: CSW talked with patient at the  bedside and his sister Gaelen Brager by phone (patient called sister). Bed offers given. Sister wants [facility] and was informed that they did not respond, however CSW will contact SNF. Call made to facility and message left for Phineas Inches at [facility].  Shelle Iron, LCSW 02/27/14: CSW received call from Phineas Inches at [facility] and they have no male rehab beds available at this time. Call made to sister and she was updated that [facility] has no beds. Ms. Klahn informed CSW that she is coming to hospital today to see patient and will talk with him about the facility responses.  Shelle Iron, LCSW  02/28/14: CSW talked with Phineas Inches at [facility] regarding bed availability as patient really want their facility. Per Tammi, no availability.  Shelle Iron, [place], [place]

## 2014-02-20 NOTE — Progress Notes (Signed)
[name] PROGRESS NOTE  Julian [name] VEH:[id number] DOB: Jul 20, 1945 DOA: 02/18/2014 PCP: Vic Blackbird, MD    Code Status: Full code Family Communication: Discussed with patient, family not available. Disposition Plan: Discharge when clinically appropriate; home versus skilled nursing facility. The patient may   be ready for discharge in the next couple of days. It is likely that he will need SNF, but he is afraid that he will lose his monthly check and his apartment. This will be discussed with a Education officer, museum or Tourist information centre manager.  Consultants:  Nephrology  Wound care nurse  Procedures:  Hemodialysis  Antibiotics:  Vancomycin 10/12>>  Zosyn 10/12>>  HPI/Subjective: Patient is currently laying in bed, undergoing hemodialysis. He has some pain in his left leg, but it is manageable with pain medication. He denies chest pain or shortness of breath.  Objective: Filed Vitals:   02/20/14 0830  BP: 121/63  Pulse: 92  Temp:   Resp: 18   temperature 98.3. Temperature maximum 99.5. Oxygen saturation 96% on nasal cannula oxygen.  Intake/Output Summary (Last 24 hours) at 02/20/14 0843 Last data filed at 02/20/14 0600  Gross per 24 hour  Intake    570 ml  Output      0 ml  Net    570 ml   Filed Weights   02/19/14 0500 02/20/14 0600 02/20/14 0715  Weight: 106.5 kg (234 lb 12.6 oz) 106.5 kg (234 lb 12.6 oz) 106.278 kg (234 lb 4.8 oz)    Exam:   General:  68 year old African/American man laying in bed, in no acute distress.  Cardiovascular: S1, S2, with a soft systolic murmur.  Respiratory: Clear anteriorly with decreased breath sounds in the bases. Breathing is nonlabored.  Abdomen: Obese, positive bowel sounds, nontender, nondistended.  Musculoskeletal: Chronic right AKA/stump intact with no ulceration; left lower extremity with dressing on and some serosanguineous fluid staining the dressing; malodorous; (not taken off until later for further  examination).  Neurologic: He is alert and oriented x3. Cranial nerves II through XII are grossly intact.   Data Reviewed: Basic Metabolic Panel:  Recent Labs Lab 02/18/14 0119 02/18/14 1205 02/19/14 0501  NA 136* 133* 137  K 4.4 4.5 4.6  CL 92* 94* 95*  CO2 23 19 21   GLUCOSE 153* 126* 156*  BUN 37* 37* 45*  CREATININE 8.10* 8.58* 9.97*  CALCIUM 9.0 8.6 8.0*  PHOS  --   --  7.6*   Liver Function Tests:  Recent Labs Lab 02/18/14 0119  AST 24  ALT 24  ALKPHOS 78  BILITOT 0.3  PROT 7.1  ALBUMIN 2.3*   No results found for this basename: LIPASE, AMYLASE,  in the last 168 hours No results found for this basename: AMMONIA,  in the last 168 hours CBC:  Recent Labs Lab 02/18/14 0119 02/19/14 0501 02/20/14 0527  WBC 11.6* 8.1 7.9  NEUTROABS 9.3*  --   --   HGB 8.3* 7.9* 8.3*  HCT 25.7* 25.0* 25.5*  MCV 95.2 96.2 94.1  PLT 153 145* 150   Cardiac Enzymes: No results found for this basename: CKTOTAL, CKMB, CKMBINDEX, TROPONINI,  in the last 168 hours BNP (last 3 results) No results found for this basename: PROBNP,  in the last 8760 hours CBG:  Recent Labs Lab 02/18/14 2141 02/19/14 0800 02/19/14 1134 02/19/14 1628 02/19/14 2107  GLUCAP 144* 134* 193* 117* 120*    Recent Results (from the past 240 hour(s))  CULTURE, BLOOD (ROUTINE X 2)     Status: None  Collection Time    02/18/14  1:22 AM      Result Value Ref Range Status   Specimen Description Blood BLOOD LEFT ARM   Final   Special Requests BOTTLES DRAWN AEROBIC ONLY 6CC   Final   Culture NO GROWTH <24 HRS   Final   Report Status PENDING   Incomplete  CULTURE, BLOOD (ROUTINE X 2)     Status: None   Collection Time    02/18/14  1:36 AM      Result Value Ref Range Status   Specimen Description Blood BLOOD LEFT HAND   Final   Special Requests BOTTLES DRAWN AEROBIC ONLY 6CC   Final   Culture NO GROWTH <24 HRS   Final   Report Status PENDING   Incomplete  MRSA PCR SCREENING     Status: None    Collection Time    02/18/14  4:40 AM      Result Value Ref Range Status   MRSA by PCR NEGATIVE  NEGATIVE Final   Comment:            The GeneXpert MRSA Assay (FDA     approved for NASAL specimens     only), is one component of a     comprehensive MRSA colonization     surveillance program. It is not     intended to diagnose MRSA     infection nor to guide or     monitor treatment for     MRSA infections.     Studies: No results found.  Scheduled Meds: . aspirin EC  81 mg Oral Daily  . atorvastatin  80 mg Oral QPM  . cinacalcet  120 mg Oral Q lunch  . collagenase   Topical Daily  . enoxaparin (LOVENOX) injection  30 mg Subcutaneous Q24H  . epoetin alfa  14,000 Units Intravenous Q M,W,F-HD  . insulin aspart  0-5 Units Subcutaneous QHS  . insulin aspart  0-9 Units Subcutaneous TID WC  . multivitamin  1 tablet Oral QHS  . piperacillin-tazobactam (ZOSYN)  IV  2.25 g Intravenous 3 times per day  . sevelamer carbonate  2,400 mg Oral TID WC  . sodium chloride  3 mL Intravenous Q12H  . vancomycin  1,000 mg Intravenous Q M,W,F-HD   Continuous Infusions:  Assessment and plan: Principal Problem:   Sepsis Active Problems:   End-stage renal disease on hemodialysis   Foot ulcer, left   CAD (coronary artery disease)   Cellulitis of left lower extremity   Hypotension   Diabetes mellitus type 2 with complications   Anemia of renal disease   Lactic acidosis   PVD (peripheral vascular disease)   1. Sepsis with lactic acidosis. Secondary to left lower extremity cellulitis with gangrene. Blood cultures are negative to date. MRSA PCR screening negative. The patient's improving clinically, although his blood pressure is still on the low-normal side. We'll continue vancomycin and Zosyn for now. We'll check a followup lactic acid level tomorrow.  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 Clinically it looks like gangrene. Plain radiographs  revealed no evidence of osteomyelitis. The wound care nurse evaluated the patient and made recommendations for wound care/dressing changes with Santyl ointment/moist dressing/Kerlix and tape to be changed daily. The patient's vascular surgeon is Dr. Ruta Hinds. According to his note in September, the patient may need an amputation as he is no longer a revascularization candidate because of his chronic debility. We'll continue dressing changes for now and  antibiotics. Will refer the patient back to Dr. Oneida Alar following discharge as I believe the patient will eventually need an amputation of his left lower extremity.  Hypotension, secondary to sepsis and hypovolemia. Status post gentle IV fluids with improvement in his blood pressure. We'll continue to hold labetalol. We'll add low-dose Midodrine.  Anemia of chronic kidney disease/end-stage renal disease. Currently stable with no indication for transfusion yet. Continue Epogen per nephrology.  Type 2 diabetes mellitus with nephropathy and neuropathy. The patient was taken off of insulin by his primary care physician per his account several months ago. His hemoglobin A1c has increased to 7.7, up from 6.8 approximately 5 months ago. We'll continue sliding scale NovoLog for now. The patient may need to restart small dosing of Lantus or Levemir at home upon discharge.  Coronary artery disease. Currently stable. The patient has no complaints of chest pain. Continue aspirin and statin.  DVT prophylaxis with subcutaneous heparin.          Time spent: 35 minutes    [facility] Pager [phone]. If 7PM-7AM, please contact night-coverage at [email or web address], password [facility] 02/20/2014, 8:43 AM  LOS: 2 days

## 2014-02-20 NOTE — Progress Notes (Signed)
ANTIBIOTIC CONSULT NOTE - FOLLOW UP  Pharmacy Consult for Vancomycin & Zosyn Indication: sepsis, PNA, cellulitis  No Known Allergies  Patient Measurements: Height: 5\' 10"  (177.8 cm) Weight: 234 lb 4.8 oz (106.278 kg) IBW/kg (Calculated) : 73  Vital Signs: Temp: 98.3 F (36.8 C) (10/14 0715) Temp Source: Oral (10/14 0715) BP: 112/63 mmHg (10/14 1115) Pulse Rate: 91 (10/14 1115) Intake/Output from previous day: 10/13 0701 - 10/14 0700 In: 570 [I.V.:370; IV Piggyback:200] Out: -  Intake/Output from this shift:    Labs:  Recent Labs  02/18/14 0119 02/18/14 1205 02/19/14 0501 02/20/14 0527  WBC 11.6*  --  8.1 7.9  HGB 8.3*  --  7.9* 8.3*  PLT 153  --  145* 150  CREATININE 8.10* 8.58* 9.97*  --    Estimated Creatinine Clearance: 8.8 ml/min (by C-G formula based on Cr of 9.97). No results found for this basename: VANCOTROUGH, Corlis Leak, VANCORANDOM, GENTTROUGH, GENTPEAK, GENTRANDOM, TOBRATROUGH, TOBRAPEAK, TOBRARND, AMIKACINPEAK, AMIKACINTROU, AMIKACIN,  in the last 72 hours   Microbiology: Recent Results (from the past 720 hour(s))  CULTURE, BLOOD (ROUTINE X 2)     Status: None   Collection Time    02/18/14  1:22 AM      Result Value Ref Range Status   Specimen Description Blood BLOOD LEFT ARM   Final   Special Requests BOTTLES DRAWN AEROBIC ONLY 6CC   Final   Culture NO GROWTH 2 DAYS   Final   Report Status PENDING   Incomplete  CULTURE, BLOOD (ROUTINE X 2)     Status: None   Collection Time    02/18/14  1:36 AM      Result Value Ref Range Status   Specimen Description Blood BLOOD LEFT HAND   Final   Special Requests BOTTLES DRAWN AEROBIC ONLY [place]   Final   Culture NO GROWTH 2 DAYS   Final   Report Status PENDING   Incomplete  MRSA PCR SCREENING     Status: None   Collection Time    02/18/14  4:40 AM      Result Value Ref Range Status   MRSA by PCR NEGATIVE  NEGATIVE Final   Comment:            The GeneXpert MRSA Assay (FDA     approved for NASAL specimens      only), is one component of a     comprehensive MRSA colonization     surveillance program. It is not     intended to diagnose MRSA     infection nor to guide or     monitor treatment for     MRSA infections.    Anti-infectives   Start     Dose/Rate Route Frequency Ordered Stop   02/18/14 1400  piperacillin-tazobactam (ZOSYN) IVPB 2.25 g     2.25 g 100 mL/hr over 30 Minutes Intravenous 3 times per day 02/18/14 0752     02/18/14 1200  vancomycin (VANCOCIN) IVPB 1000 mg/200 mL premix     1,000 mg 200 mL/hr over 60 Minutes Intravenous Every M-W-F (Hemodialysis) 02/18/14 1101     02/18/14 0500  piperacillin-tazobactam (ZOSYN) IVPB 2.25 g     2.25 g 100 mL/hr over 30 Minutes Intravenous  Once 02/18/14 0453 02/18/14 0631   02/18/14 0315  vancomycin (VANCOCIN) IVPB 1000 mg/200 mL premix     2,000 mg 400 mL/hr over 60 Minutes Intravenous  Once 02/18/14 0313 02/18/14 0420   02/18/14 0300  piperacillin-tazobactam (ZOSYN) IVPB 4.5 g  Status:  Discontinued     4.5 g 200 mL/hr over 30 Minutes Intravenous  Once 02/18/14 0255 02/18/14 0553      Assessment: 73 yoM with ESRD requiring HD on MWF & hx DM, PVD s/p R AKA.  He presented with cough & not feeling well.   He is currently afebrile with normal WBC.  Possible sources of infection include: pneumonia (CXR inconclusive) & LLE wound, cellulitis (Xray negative for osteomyelitis). Cx data pending.   Vancomycin 10/12>> Zosyn 10/12>>  Goal of Therapy:  Eradicate infection. Vancomycin pre-HD level 15-25 mcg/ml  Plan:  Zosyn 2.25gm IV q8h Vancomycin 1000mg  IV qHD (MWF) Check pre-HD level at steady state F/U cx data & patient progress  Hart Robinsons A 02/20/2014,11:26 AM

## 2014-02-20 NOTE — Progress Notes (Signed)
Pt transfer to tele floor postponed d/t BP lability. Midodrine administered. BP improving.  Pt had problems with venous access. 24-gauge IV established in pt's left thumb. MD notified, and orders obtained for PICC placement.

## 2014-02-20 NOTE — Progress Notes (Signed)
Julian [name]  MRN: [id number]  DOB/AGE: [birth year] 68 y.o.  Primary Care Physician:[place], Lonell Grandchild, MD  Admit date: 02/18/2014  Chief Complaint:  Chief Complaint  Patient presents with  . Cough    S-Pt presented on  02/18/2014 with  Chief Complaint  Patient presents with  . Cough  .    Pt today seen on dialysis    Pt tolerating tx well   Meds . aspirin EC  81 mg Oral Daily  . atorvastatin  80 mg Oral QPM  . cinacalcet  120 mg Oral Q lunch  . collagenase   Topical Daily  . enoxaparin (LOVENOX) injection  30 mg Subcutaneous Q24H  . epoetin alfa  14,000 Units Intravenous Q M,W,F-HD  . insulin aspart  0-5 Units Subcutaneous QHS  . insulin aspart  0-9 Units Subcutaneous TID WC  . midodrine  5 mg Oral BID WC  . multivitamin  1 tablet Oral QHS  . piperacillin-tazobactam (ZOSYN)  IV  2.25 g Intravenous 3 times per day  . sevelamer carbonate  2,400 mg Oral TID WC  . sodium chloride  3 mL Intravenous Q12H  . vancomycin  1,000 mg Intravenous Q M,W,F-HD        Physical Exam: Vital signs in last 24 hours: Temp:  [97.9 F (36.6 C)-99.5 F (37.5 C)] 98.3 F (36.8 C) (10/14 0715) Pulse Rate:  [52-140] 88 (10/14 0930) Resp:  [12-27] 16 (10/14 0930) BP: (66-132)/(26-100) 105/55 mmHg (10/14 0930) SpO2:  [79 %-100 %] 96 % (10/14 0715) Weight:  [234 lb 4.8 oz (106.278 kg)-234 lb 12.6 oz (106.5 kg)] 234 lb 4.8 oz (106.278 kg) (10/14 0715) Weight change: 0 lb (0 kg) Last BM Date: 02/19/14  Intake/Output from previous day: 10/13 0701 - 10/14 0700 In: 570 [I.V.:370; IV Piggyback:200] Out: -      Physical Exam: General- pt is awake,alert, oriented to time place and person Resp- No acute REsp distress, CTA B/L NO Rhonchi CVS- S1S2 regular in rate and rhythm GIT- BS+, soft, NT, ND EXT- Right AKA, :Left foot in bandage ( foul smelling) Access-Left AVG + two needles in situ  Lab Results: CBC  Recent Labs  02/19/14 0501 02/20/14 0527  WBC 8.1 7.9  HGB 7.9* 8.3*   HCT 25.0* 25.5*  PLT 145* 150    BMET  Recent Labs  02/18/14 1205 02/19/14 0501  NA 133* 137  K 4.5 4.6  CL 94* 95*  CO2 19 21  GLUCOSE 126* 156*  BUN 37* 45*  CREATININE 8.58* 9.97*  CALCIUM 8.6 8.0*    MICRO Recent Results (from the past 240 hour(s))  CULTURE, BLOOD (ROUTINE X 2)     Status: None   Collection Time    02/18/14  1:22 AM      Result Value Ref Range Status   Specimen Description Blood BLOOD LEFT ARM   Final   Special Requests BOTTLES DRAWN AEROBIC ONLY 6CC   Final   Culture NO GROWTH <24 HRS   Final   Report Status PENDING   Incomplete  CULTURE, BLOOD (ROUTINE X 2)     Status: None   Collection Time    02/18/14  1:36 AM      Result Value Ref Range Status   Specimen Description Blood BLOOD LEFT HAND   Final   Special Requests BOTTLES DRAWN AEROBIC ONLY 6CC   Final   Culture NO GROWTH <24 HRS   Final   Report Status PENDING   Incomplete  MRSA PCR SCREENING  Status: None   Collection Time    02/18/14  4:40 AM      Result Value Ref Range Status   MRSA by PCR NEGATIVE  NEGATIVE Final   Comment:            The GeneXpert MRSA Assay (FDA     approved for NASAL specimens     only), is one component of a     comprehensive MRSA colonization     surveillance program. It is not     intended to diagnose MRSA     infection nor to guide or     monitor treatment for     MRSA infections.      Lab Results  Component Value Date   CALCIUM 8.0* 02/19/2014   CAION 1.32* 01/26/2013   PHOS 7.6* 02/19/2014            Impression: 1)Renal  ESRD on HD On MWF as outpt Pt was hypotensive yesterday so HD was held again Pt to receive HD today  2)CVS- Hypotension Not on pressors  3)Anemia HGb not at goal (9--11) ESRD + Chronic Wound + Acute sepsis Will keep on epo  4)CKD Mineral-Bone Disorder Secondary Hyperparathyroidism  present      On Sensipar Phosphorus at goal.   5)ID-admited with sepsis On Vanco+ Zosyn Primary MD following  6)  Electrolytes  Normokalemic NOrmonatremic   7)Acid base Co2 at goal     Plan:  Pt being dialyzed  today      Zenith Lamphier S 02/20/2014, 9:42 AM

## 2014-02-21 ENCOUNTER — Ambulatory Visit: Payer: PRIVATE HEALTH INSURANCE | Admitting: Vascular Surgery

## 2014-02-21 ENCOUNTER — Encounter (HOSPITAL_COMMUNITY): Payer: Self-pay | Admitting: Internal Medicine

## 2014-02-21 DIAGNOSIS — I96 Gangrene, not elsewhere classified: Secondary | ICD-10-CM | POA: Diagnosis present

## 2014-02-21 LAB — RENAL FUNCTION PANEL
ANION GAP: 17 — AB (ref 5–15)
Albumin: 2.1 g/dL — ABNORMAL LOW (ref 3.5–5.2)
BUN: 29 mg/dL — ABNORMAL HIGH (ref 6–23)
CO2: 25 meq/L (ref 19–32)
CREATININE: 7.15 mg/dL — AB (ref 0.50–1.35)
Calcium: 8.5 mg/dL (ref 8.4–10.5)
Chloride: 96 mEq/L (ref 96–112)
GFR calc Af Amer: 8 mL/min — ABNORMAL LOW (ref >90)
GFR calc non Af Amer: 7 mL/min — ABNORMAL LOW (ref >90)
GLUCOSE: 109 mg/dL — AB (ref 70–99)
POTASSIUM: 5.4 meq/L — AB (ref 3.7–5.3)
Phosphorus: 5.7 mg/dL — ABNORMAL HIGH (ref 2.3–4.6)
Sodium: 138 mEq/L (ref 137–147)

## 2014-02-21 LAB — GLUCOSE, CAPILLARY
GLUCOSE-CAPILLARY: 121 mg/dL — AB (ref 70–99)
Glucose-Capillary: 118 mg/dL — ABNORMAL HIGH (ref 70–99)
Glucose-Capillary: 190 mg/dL — ABNORMAL HIGH (ref 70–99)
Glucose-Capillary: 118 mg/dL — ABNORMAL HIGH (ref 70–99)

## 2014-02-21 LAB — LACTIC ACID, PLASMA: Lactic Acid, Venous: 2.4 mmol/L — ABNORMAL HIGH (ref 0.5–2.2)

## 2014-02-21 NOTE — Progress Notes (Signed)
Unable to obtain another peripheral IV in pt's lt arm with ultrasound guidance. Pt has old dialysis graft in lt upper arm. Spoke with PICC team at [facility] and they advised it wouldn't be a good option to attempt PICC in lt arm because of the old dialysis graft that's in that arm. They recommended having pt sent to IR for PICC placement in jugular. Dr. Caryn Section made aware. No further orders given.

## 2014-02-21 NOTE — Progress Notes (Signed)
Spoke with Dr. Caryn Section concerning order for PICC line placement. Pt is on dialysis with creatinine clearance of only 10.  Pt also has history of clotting issues within pt's current fistula in right arm. This raises concern for PICC line placement in other arm. Dr. Caryn Section gave order to hold off on PICC line placement for right now. I will attempt to place peripheral IV with ultrasound guidance. Terence Lux, ICU RN notified of plan of care.

## 2014-02-21 NOTE — Progress Notes (Signed)
Subjective: Interval History: has no complaint of nausea or vomiting. Patient also denies any difficulty in breathing..  Objective: Vital signs in last 24 hours: Temp:  [97.5 F (36.4 C)-100.7 F (38.2 C)] 98.4 F (36.9 C) (10/15 0800) Pulse Rate:  [49-91] 79 (10/15 0500) Resp:  [10-26] 11 (10/15 0800) BP: (53-120)/(28-96) 89/42 mmHg (10/15 0800) SpO2:  [90 %-100 %] 93 % (10/15 0500) Weight:  [105.9 kg (233 lb 7.5 oz)] 105.9 kg (233 lb 7.5 oz) (10/15 0500) Weight change: -0.222 kg (-7.8 oz)  Intake/Output from previous day: 10/14 0701 - 10/15 0700 In: 802.3 [I.V.:252.3; IV Piggyback:550] Out: 1000  Intake/Output this shift:    Generally patient is alert in no apparent distress Chest is clear to auscultation no rales or rhonchi His heart exam regular rate and rhythm no murmur Extremities he has gangrene of his left leg but no edema  Lab Results:  Recent Labs  02/19/14 0501 02/20/14 0527  WBC 8.1 7.9  HGB 7.9* 8.3*  HCT 25.0* 25.5*  PLT 145* 150   BMET:  Recent Labs  02/19/14 0501 02/21/14 0522  NA 137 138  K 4.6 5.4*  CL 95* 96  CO2 21 25  GLUCOSE 156* 109*  BUN 45* 29*  CREATININE 9.97* 7.15*  CALCIUM 8.0* 8.5   No results found for this basename: PTH,  in the last 72 hours Iron Studies: No results found for this basename: IRON, TIBC, TRANSFERRIN, FERRITIN,  in the last 72 hours  Studies/Results: No results found.  I have reviewed the patient's current medications.  Assessment/Plan: Problem #1 end-stage renal disease: Status post hemodialysis yesterday. Patient at this moment does not have any nausea vomiting. His potassium is increasing and this is, potassium intake. Problem #2 anemia: History of her hematocrit is low but stable. Presently patient is on Epogen. Problem #3 coronary disease Problem #4 cellulitis/gangrene of his left leg. Patient may require amputation. Patient however wants to wait and he does want to have surgery now. Problem #5  diabetes Problem #6 hypertension: Present his blood pressure is low in patients of hypertensive medications. Problem #7 metabolic bone disease: His calcium is within range. His phosphorus however is high. Presently he is on a binder and phosphorus is improving. Plan: We'll make arrangements for patient to get dialysis tomorrow We'll put patient on low potassium renal diet.    LOS: 3 days   Tylor Courtwright S 02/21/2014,8:48 AM

## 2014-02-21 NOTE — Progress Notes (Signed)
[name] PROGRESS NOTE  Julian [name]:[id number] DOB: Aug 13, 1945 DOA: 02/18/2014 PCP: Vic Blackbird, MD    Code Status: Full code Family Communication: Discussed with patient's sister. Disposition Plan: Discharge when clinically appropriate; he has agreed to skilled nursing facility placement  Consultants:  Nephrology  Wound care nurse  Procedures:  Hemodialysis  Antibiotics:  Vancomycin 10/12>>  Zosyn 10/12>>  HPI/Subjective: Patient is currently laying in bed, no new complaints. He has some pain in his left leg, but it is manageable with pain medication. He denies chest pain or shortness of breath.  Objective: Filed Vitals:   02/21/14 0800  BP: 89/42  Pulse:   Temp: 98.4 F (36.9 C)  Resp: 11    Temperature maximum 100.7 Oxygen saturation 93% on nasal cannula oxygen.  Intake/Output Summary (Last 24 hours) at 02/21/14 0930 Last data filed at 02/21/14 0700  Gross per 24 hour  Intake 802.33 ml  Output   1000 ml  Net -197.67 ml   Filed Weights   02/20/14 0600 02/20/14 0715 02/21/14 0500  Weight: 106.5 kg (234 lb 12.6 oz) 106.278 kg (234 lb 4.8 oz) 105.9 kg (233 lb 7.5 oz)    Exam:   General:  68 year old African/American man laying in bed, in no acute distress.  Cardiovascular: S1, S2, with a soft systolic murmur.  Respiratory: Clear anteriorly with decreased breath sounds in the bases. Breathing is nonlabored.  Abdomen: Obese, positive bowel sounds, nontender, nondistended.  Musculoskeletal: Chronic right AKA/stump intact with no ulceration; LLE with cold boggy left foot and bluish tint on great toe; transmetatarsal amputation; mild malodorous serosanguineous drainage; gangrenous wounds/ulcers covering the left leg below the knee  Neurologic: He is alert and oriented x3. Cranial nerves II through XII are grossly intact.   Data Reviewed: Basic Metabolic Panel:  Recent Labs Lab 02/18/14 0119 02/18/14 1205 02/19/14 0501  02/21/14 0522  NA 136* 133* 137 138  K 4.4 4.5 4.6 5.4*  CL 92* 94* 95* 96  CO2 23 19 21 25   GLUCOSE 153* 126* 156* 109*  BUN 37* 37* 45* 29*  CREATININE 8.10* 8.58* 9.97* 7.15*  CALCIUM 9.0 8.6 8.0* 8.5  PHOS  --   --  7.6* 5.7*   Liver Function Tests:  Recent Labs Lab 02/18/14 0119 02/21/14 0522  AST 24  --   ALT 24  --   ALKPHOS 78  --   BILITOT 0.3  --   PROT 7.1  --   ALBUMIN 2.3* 2.1*   No results found for this basename: LIPASE, AMYLASE,  in the last 168 hours No results found for this basename: AMMONIA,  in the last 168 hours CBC:  Recent Labs Lab 02/18/14 0119 02/19/14 0501 02/20/14 0527  WBC 11.6* 8.1 7.9  NEUTROABS 9.3*  --   --   HGB 8.3* 7.9* 8.3*  HCT 25.7* 25.0* 25.5*  MCV 95.2 96.2 94.1  PLT 153 145* 150   Cardiac Enzymes: No results found for this basename: CKTOTAL, CKMB, CKMBINDEX, TROPONINI,  in the last 168 hours BNP (last 3 results) No results found for this basename: PROBNP,  in the last 8760 hours CBG:  Recent Labs Lab 02/19/14 2107 02/20/14 1217 02/20/14 1625 02/20/14 2142 02/21/14 0738  GLUCAP 120* 104* 195* 139* 118*    Recent Results (from the past 240 hour(s))  CULTURE, BLOOD (ROUTINE X 2)     Status: None   Collection Time    02/18/14  1:22 AM      Result Value Ref  Range Status   Specimen Description Blood BLOOD LEFT ARM   Final   Special Requests BOTTLES DRAWN AEROBIC ONLY 6CC   Final   Culture NO GROWTH 2 DAYS   Final   Report Status PENDING   Incomplete  CULTURE, BLOOD (ROUTINE X 2)     Status: None   Collection Time    02/18/14  1:36 AM      Result Value Ref Range Status   Specimen Description Blood BLOOD LEFT HAND   Final   Special Requests BOTTLES DRAWN AEROBIC ONLY 6CC   Final   Culture NO GROWTH 2 DAYS   Final   Report Status PENDING   Incomplete  MRSA PCR SCREENING     Status: None   Collection Time    02/18/14  4:40 AM      Result Value Ref Range Status   MRSA by PCR NEGATIVE  NEGATIVE Final    Comment:            The GeneXpert MRSA Assay (FDA     approved for NASAL specimens     only), is one component of a     comprehensive MRSA colonization     surveillance program. It is not     intended to diagnose MRSA     infection nor to guide or     monitor treatment for     MRSA infections.     Studies: No results found.  Scheduled Meds: . aspirin EC  81 mg Oral Daily  . atorvastatin  80 mg Oral QPM  . cinacalcet  120 mg Oral Q lunch  . collagenase   Topical Daily  . enoxaparin (LOVENOX) injection  30 mg Subcutaneous Q24H  . epoetin alfa  14,000 Units Intravenous Q M,W,F-HD  . insulin aspart  0-5 Units Subcutaneous QHS  . insulin aspart  0-9 Units Subcutaneous TID WC  . midodrine  5 mg Oral BID WC  . multivitamin  1 tablet Oral QHS  . piperacillin-tazobactam (ZOSYN)  IV  2.25 g Intravenous 3 times per day  . sevelamer carbonate  2,400 mg Oral TID WC  . sodium chloride  3 mL Intravenous Q12H  . vancomycin  1,000 mg Intravenous Q M,W,F-HD   Continuous Infusions:  Assessment and plan: Principal Problem:   Sepsis Active Problems:   End-stage renal disease on hemodialysis   Foot ulcer, left   CAD (coronary artery disease)   Cellulitis of left lower extremity   Hypotension   Diabetes mellitus type 2 with complications   Anemia of renal disease   Lactic acidosis   PVD (peripheral vascular disease)   1. Sepsis with lactic acidosis. Secondary to left lower extremity cellulitis with gangrene. Blood cultures are negative to date. MRSA PCR screening negative. His lactic acid level has decreased. There has been some clinical improvement, lthough his blood pressure is still on the low-normal side. He ran a low-grade fever overnight, but is afebrile now. We'll continue vancomycin and Zosyn for now.  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 Clinically it looks like gangrene-wet gangrene of his foot and dry gangrene of his leg. Plain  radiographs revealed no evidence of osteomyelitis. The wound care nurse evaluated the patient and made recommendations for wound care/dressing changes with Santyl ointment/moist dressing/Kerlix and tape to be changed daily. The patient will need definitive treatment with an AKA or BKA as antibiotics are just a temporary fix. I discussed this with the patient and his sister. His  sister Mrs. Brodnax informs me that their father died of sepsis/bacteremia from an infected leg that he refuses to get amputated. The patient's vascular surgeon is Dr. Ruta Hinds. I discussed the patient with Dr. Oneida Alar who agrees that he needs an amputation and that he is not a revascularization candidate because of debility. He actually had an appointment scheduled for him on today 10/15. I made an outpatient appointment for the patient to followup with Dr. Oneida Alar next week for plans for definitive treatment-hopefully amputation.    Hypotension, secondary to sepsis and hypovolemia. Status post gentle IV fluids with some improvement in his blood pressure. We'll continue to hold labetalol. We'll continue low-dose Midodrine.  Anemia of chronic kidney disease/end-stage renal disease. Currently stable with no indication for transfusion yet. Continue Epogen per nephrology.  Type 2 diabetes mellitus with nephropathy and neuropathy. Capillary blood glucose currently controlled. The patient was taken off of insulin by his primary care physician per his account several months ago. His hemoglobin A1c has increased to 7.7, up from 6.8 approximately 5 months ago. We'll continue sliding scale NovoLog for now. We may need to restart small dosing of Lantus or Levemir at home upon discharge.  Coronary artery disease. Currently stable. The patient has no complaints of chest pain. Continue aspirin and statin.  DVT prophylaxis with subcutaneous heparin.          Time spent: 35 minutes    [facility] Pager  [phone]. If 7PM-7AM, please contact night-coverage at [email or web address], password [facility] 02/21/2014, 9:30 AM  LOS: 3 days

## 2014-02-22 DIAGNOSIS — I96 Gangrene, not elsewhere classified: Secondary | ICD-10-CM

## 2014-02-22 LAB — CBC
HCT: 27 % — ABNORMAL LOW (ref 39.0–52.0)
Hemoglobin: 8.6 g/dL — ABNORMAL LOW (ref 13.0–17.0)
MCH: 30.4 pg (ref 26.0–34.0)
MCHC: 31.9 g/dL (ref 30.0–36.0)
MCV: 95.4 fL (ref 78.0–100.0)
PLATELETS: 182 10*3/uL (ref 150–400)
RBC: 2.83 MIL/uL — ABNORMAL LOW (ref 4.22–5.81)
RDW: 16.5 % — AB (ref 11.5–15.5)
WBC: 12.4 10*3/uL — AB (ref 4.0–10.5)

## 2014-02-22 LAB — BASIC METABOLIC PANEL
ANION GAP: 18 — AB (ref 5–15)
BUN: 37 mg/dL — ABNORMAL HIGH (ref 6–23)
CO2: 23 mEq/L (ref 19–32)
Calcium: 8.6 mg/dL (ref 8.4–10.5)
Chloride: 96 mEq/L (ref 96–112)
Creatinine, Ser: 8.51 mg/dL — ABNORMAL HIGH (ref 0.50–1.35)
GFR, EST AFRICAN AMERICAN: 7 mL/min — AB (ref >90)
GFR, EST NON AFRICAN AMERICAN: 6 mL/min — AB (ref >90)
Glucose, Bld: 103 mg/dL — ABNORMAL HIGH (ref 70–99)
Potassium: 5.2 mEq/L (ref 3.7–5.3)
SODIUM: 137 meq/L (ref 137–147)

## 2014-02-22 LAB — GLUCOSE, CAPILLARY
GLUCOSE-CAPILLARY: 168 mg/dL — AB (ref 70–99)
GLUCOSE-CAPILLARY: 164 mg/dL — AB (ref 70–99)
Glucose-Capillary: 106 mg/dL — ABNORMAL HIGH (ref 70–99)
Glucose-Capillary: 241 mg/dL — ABNORMAL HIGH (ref 70–99)

## 2014-02-22 MED ORDER — SODIUM CHLORIDE 0.9 % IV SOLN: 100.0000 mL | INTRAVENOUS | Status: DC | PRN

## 2014-02-22 MED ORDER — MIDODRINE HCL 5 MG PO TABS
5.0000 mg | ORAL_TABLET | Freq: Three times a day (TID) | ORAL | Status: DC
  Administered 2014-02-22 – 2014-03-01 (×17): 5 mg via ORAL
  Filled 2014-02-22 (×24): qty 1

## 2014-02-22 MED ORDER — ALTEPLASE 2 MG IJ SOLR
2.0000 mg | Freq: Once | INTRAMUSCULAR | Status: AC | PRN
  Filled 2014-02-22: qty 2

## 2014-02-22 NOTE — Clinical Social Work Note (Signed)
Pt transferring to [facility] today. Will update CSW there on pt.   Benay Pike, [place]

## 2014-02-22 NOTE — Progress Notes (Signed)
Subjective: Interval History: Patient offers no complaint. Denies any difficulty ion breathing  Objective: Vital signs in last 24 hours: Temp:  [98.1 F (36.7 C)-98.7 F (37.1 C)] 98.7 F (37.1 C) (10/16 0400) Pulse Rate:  [78-85] 85 (10/16 0100) Resp:  [7-25] 16 (10/16 0500) BP: (80-108)/(34-66) 89/45 mmHg (10/16 0500) SpO2:  [84 %-100 %] 100 % (10/16 0400) Weight:  [105.6 kg (232 lb 12.9 oz)] 105.6 kg (232 lb 12.9 oz) (10/16 0500) Weight change: -0.678 kg (-1 lb 7.9 oz)  Intake/Output from previous day: 10/15 0701 - 10/16 0700 In: 140 [I.V.:90; IV Piggyback:50] Out: 1 [Stool:1] Intake/Output this shift:    Generally patient is alert in no apparent distress Chest is clear to auscultation no rales or rhonchi His heart exam regular rate and rhythm no murmur Extremities he has gangrene of his left leg but no edema  Lab Results:  Recent Labs  02/20/14 0527 02/22/14 0445  WBC 7.9 12.4*  HGB 8.3* 8.6*  HCT 25.5* 27.0*  PLT 150 182   BMET:   Recent Labs  02/21/14 0522 02/22/14 0445  NA 138 137  K 5.4* 5.2  CL 96 96  CO2 25 23  GLUCOSE 109* 103*  BUN 29* 37*  CREATININE 7.15* 8.51*  CALCIUM 8.5 8.6   No results found for this basename: PTH,  in the last 72 hours Iron Studies: No results found for this basename: IRON, TIBC, TRANSFERRIN, FERRITIN,  in the last 72 hours  Studies/Results: No results found.  I have reviewed the patient's current medications.  Assessment/Plan: Problem #1 end-stage renal disease: Status post hemodialysis on Wednsday. Patient presently asymptomatic and his potassium has improved Problem #2 anemia: History of her hematocrit is low but stable. Presently patient is on Epogen. Problem #3 coronary disease Problem #4 cellulitis/gangrene of his left leg.Patient on antibiotics afebrile but his white blood cell count has increased. Problem #5 diabetes Problem #6 hypertension: Present his blood pressure is low in patients of hypertensive  medications. Problem #7 metabolic bone disease: His calcium is within range. His phosphorus however is high. Presently he is on a binder and phosphorus is improving. Plan: We'll make arrangements to get dialysis After prolonged discussion patient has agreed to have amputation.Hence may be better to make arrangement with Dr. Oneida Alar to have amputation of his leg because of his sepsis    LOS: 4 days   Oliver Heitzenrater S 02/22/2014,7:32 AM

## 2014-02-22 NOTE — Procedures (Signed)
   HEMODIALYSIS TREATMENT NOTE:  4 hour heparin-free dialysis completed via right upper arm AVG (15g ante/retrograde).  Goal NOT met:  BP unable to tolerate removal of 1 liter as ordered.  As per standing order, ultrafiltration was interrupted for SBP<90 (for 30 minutes). Net UF removed =724cc.  Vancomycin 1g and EPO 14,000u given with HD.  All blood was reinfused and hemostasis was achieved within 10 minutes.  Report given to Sharene Skeans, RN.  Rutha Melgoza L. Shantana Christon, RN, CDN

## 2014-02-22 NOTE — Progress Notes (Signed)
Admission note:  Arrival Method: stretcher via [facility] Mental Orientation: alert & oriented x 4  Telemetry: tele box #20 applied and CCMD notified   IV: left shoulder; fluids at [facility]  Pain: pt denies  Tubes: N/A Safety Measures: Patient Handbook has been given, and discussed the Fall Prevention worksheet. Left at bedside  Admission: Completed and admission orders have been written  6E Orientation: Patient has been oriented to the unit, staff and to the room.

## 2014-02-22 NOTE — Progress Notes (Signed)
[facility] called to receive report.

## 2014-02-22 NOTE — Progress Notes (Signed)
Report called to Huntley Estelle, RN at [facility]. Patient taken to facility by [place]. Patient called sister, [name], and informed her about transfer.

## 2014-02-22 NOTE — Progress Notes (Signed)
[name] PROGRESS NOTE  DONTREZ PETTIS MQK:[id number] DOB: Jun 13, 1945 DOA: 02/18/2014 PCP: Vic Blackbird, MD    Code Status: Full code Family Communication: Discussed with patient's sister, Dione Plover Disposition Plan: Plan for transfer to [facility] for definitive treatment of left leg gangrene.  Consultants:  Phone call consult with vascular surgeon Dr. Ruta Hinds.  Nephrology  Wound care nurse  Procedures:  Hemodialysis  Antibiotics:  Vancomycin 10/12>>  Zosyn 10/12>>  HPI/Subjective: Patient is currently laying in bed, no new complaints. After he discussed it further with his family, he is decided that he wants an amputation sooner than later.  Objective: Filed Vitals:   02/22/14 1000  BP: 101/60  Pulse:   Temp:   Resp: 18   temperature 98.2. Pulse 82. Oxygen saturation 99% on room air.  Intake/Output Summary (Last 24 hours) at 02/22/14 1120 Last data filed at 02/21/14 2000  Gross per 24 hour  Intake    100 ml  Output      1 ml  Net     99 ml   Filed Weights   02/20/14 0715 02/21/14 0500 02/22/14 0500  Weight: 106.278 kg (234 lb 4.8 oz) 105.9 kg (233 lb 7.5 oz) 105.6 kg (232 lb 12.9 oz)    Exam:   General:  68 year old 68 man laying in bed, in no acute distress.  Cardiovascular: S1, S2, with a soft systolic murmur.  Respiratory: Clear anteriorly with decreased breath sounds in the bases. Breathing is nonlabored.  Abdomen: Obese, positive bowel sounds, nontender, nondistended.  Musculoskeletal: Chronic right AKA/stump intact with no ulceration; LLE with cold boggy left foot and bluish tint on great toe; transmetatarsal amputation; mild malodorous serosanguineous drainage; gangrenous wounds/ulcers covering the left leg below the knee  Neurologic: He is alert and oriented x3. Cranial nerves II through XII are grossly intact.   Data Reviewed: Basic Metabolic Panel:  Recent Labs Lab 02/18/14 0119  02/18/14 1205 02/19/14 0501 02/21/14 0522 02/22/14 0445  NA 136* 133* 137 138 137  K 4.4 4.5 4.6 5.4* 5.2  CL 92* 94* 95* 96 96  CO2 23 19 21 25 23   GLUCOSE 153* 126* 156* 109* 103*  BUN 37* 37* 45* 29* 37*  CREATININE 8.10* 8.58* 9.97* 7.15* 8.51*  CALCIUM 9.0 8.6 8.0* 8.5 8.6  PHOS  --   --  7.6* 5.7*  --    Liver Function Tests:  Recent Labs Lab 02/18/14 0119 02/21/14 0522  AST 24  --   ALT 24  --   ALKPHOS 78  --   BILITOT 0.3  --   PROT 7.1  --   ALBUMIN 2.3* 2.1*   No results found for this basename: LIPASE, AMYLASE,  in the last 168 hours No results found for this basename: AMMONIA,  in the last 168 hours CBC:  Recent Labs Lab 02/18/14 0119 02/19/14 0501 02/20/14 0527 02/22/14 0445  WBC 11.6* 8.1 7.9 12.4*  NEUTROABS 9.3*  --   --   --   HGB 8.3* 7.9* 8.3* 8.6*  HCT 25.7* 25.0* 25.5* 27.0*  MCV 95.2 96.2 94.1 95.4  PLT 153 145* 150 182   Cardiac Enzymes: No results found for this basename: CKTOTAL, CKMB, CKMBINDEX, TROPONINI,  in the last 168 hours BNP (last 3 results) No results found for this basename: PROBNP,  in the last 8760 hours CBG:  Recent Labs Lab 02/21/14 0738 02/21/14 1127 02/21/14 1618 02/21/14 2113 02/22/14 0741  GLUCAP 118* 190* 121* 118* 106*  Recent Results (from the past 240 hour(s))  CULTURE, BLOOD (ROUTINE X 2)     Status: None   Collection Time    02/18/14  1:22 AM      Result Value Ref Range Status   Specimen Description BLOOD LEFT ARM   Final   Special Requests BOTTLES DRAWN AEROBIC ONLY 6CC   Final   Culture NO GROWTH 3 DAYS   Final   Report Status PENDING   Incomplete  CULTURE, BLOOD (ROUTINE X 2)     Status: None   Collection Time    02/18/14  1:36 AM      Result Value Ref Range Status   Specimen Description BLOOD LEFT HAND   Final   Special Requests BOTTLES DRAWN AEROBIC ONLY 6CC   Final   Culture NO GROWTH 3 DAYS   Final   Report Status PENDING   Incomplete  MRSA PCR SCREENING     Status: None    Collection Time    02/18/14  4:40 AM      Result Value Ref Range Status   MRSA by PCR NEGATIVE  NEGATIVE Final   Comment:            The GeneXpert MRSA Assay (FDA     approved for NASAL specimens     only), is one component of a     comprehensive MRSA colonization     surveillance program. It is not     intended to diagnose MRSA     infection nor to guide or     monitor treatment for     MRSA infections.     Studies: No results found.  Scheduled Meds: . aspirin EC  81 mg Oral Daily  . atorvastatin  80 mg Oral QPM  . cinacalcet  120 mg Oral Q lunch  . collagenase   Topical Daily  . enoxaparin (LOVENOX) injection  30 mg Subcutaneous Q24H  . epoetin alfa  14,000 Units Intravenous Q M,W,F-HD  . insulin aspart  0-5 Units Subcutaneous QHS  . insulin aspart  0-9 Units Subcutaneous TID WC  . midodrine  5 mg Oral TID WC  . multivitamin  1 tablet Oral QHS  . piperacillin-tazobactam (ZOSYN)  IV  2.25 g Intravenous 3 times per day  . sevelamer carbonate  2,400 mg Oral TID WC  . sodium chloride  3 mL Intravenous Q12H  . vancomycin  1,000 mg Intravenous Q M,W,F-HD   Continuous Infusions:  Assessment and plan: Principal Problem:   Sepsis Active Problems:   Gangrene of lower extremity   End-stage renal disease on hemodialysis   Foot ulcer, left   CAD (coronary artery disease)   Cellulitis of left lower extremity   Hypotension   Diabetes mellitus type 2 with complications   Anemia of renal disease   Lactic acidosis   PVD (peripheral vascular disease)   1. Sepsis with lactic acidosis. Secondary to left lower extremity cellulitis with gangrene. Blood cultures are negative to date. MRSA PCR screening negative. His lactic acid level has decreased. There has been some clinical improvement, lthough his blood pressure is still on the low-normal side. He ran a low-grade fever 2 nights ago but is afebrile now. His white blood cell count has increased some. We'll continue vancomycin and  Zosyn.  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 Clinically it looks like gangrene-wet gangrene of his foot and dry gangrene of his leg. Plain radiographs revealed no evidence of osteomyelitis. The wound  care nurse evaluated the patient and made recommendations for wound care/dressing changes with Santyl ointment/moist dressing/Kerlix and tape to be changed daily. The patient will need definitive treatment with an AKA or BKA as antibiotics are just a temporary fix. I discussed this with the patient and his sister again. The patient has now decided to move forward with amputation "to save my life" sooner than later. I discussed the patient with his vascular surgeon, Dr. Ruta Hinds yesterday and this morning. He is in agreement with having the patient transferred for AKA or BKA for definitive treatment. The patient is no longer a candidate for revascularization per Dr. Oneida Alar. We will arrange for transfer to [facility] with consultation/evaluation with Dr. Oneida Alar.  Of note, his sister informs me that their father died of sepsis/bacteremia from an infected leg that he refused to get amputated.     Hypotension, secondary to sepsis and hypovolemia. Status post gentle IV fluids with some improvement in his blood pressure. his blood pressure has been consistently at 90 or above systolically.  We'll continue to hold labetalol. We'll continue Midodrine with titration up as needed . The patient is not tachycardic. He is alert and oriented x3.   Anemia of chronic kidney disease/end-stage renal disease. Currently stable with no indication for transfusion yet. Continue Epogen per nephrology.  Type 2 diabetes mellitus with nephropathy and neuropathy. Capillary blood glucose currently controlled. The patient was taken off of insulin by his primary care physician per his account several months ago. His hemoglobin A1c has increased to 7.7, up from 6.8 approximately 5 months ago.  We'll continue sliding scale NovoLog for now.  He may require a restart  of small dosing of Lantus or Levemir at home upon discharge.  Coronary artery disease. Currently stable. The patient has no complaints of chest pain. Continue aspirin and statin.  DVT prophylaxis with subcutaneous heparin.     ADDENDUM DISPOSITION: I have discussed the patient with hospitalist, Dr. Doree Barthel at [facility]. She has agreed to accept the patient on her service. Dr. Ruta Hinds was informed earlier that the patient would be transferred to [facility]. The tentative plan is for amputation for Monday 02/25/14. The patient is being dialyzed now. His blood pressure has been stable and above 935 systolically. His next scheduled dialysis will be for Monday 02/25/14. Nephrology will need to be consulted.     Time spent: 30 minutes    [facility] Pager [phone]. If 7PM-7AM, please contact night-coverage at [email or web address], password [facility] 02/22/2014, 11:20 AM  LOS: 4 days

## 2014-02-23 DIAGNOSIS — N189 Chronic kidney disease, unspecified: Secondary | ICD-10-CM

## 2014-02-23 DIAGNOSIS — E1122 Type 2 diabetes mellitus with diabetic chronic kidney disease: Secondary | ICD-10-CM

## 2014-02-23 LAB — GLUCOSE, CAPILLARY
GLUCOSE-CAPILLARY: 103 mg/dL — AB (ref 70–99)
GLUCOSE-CAPILLARY: 137 mg/dL — AB (ref 70–99)
Glucose-Capillary: 155 mg/dL — ABNORMAL HIGH (ref 70–99)
Glucose-Capillary: 142 mg/dL — ABNORMAL HIGH (ref 70–99)

## 2014-02-23 LAB — CULTURE, BLOOD (ROUTINE X 2)
CULTURE: NO GROWTH
CULTURE: NO GROWTH

## 2014-02-23 MED ORDER — DIPHENOXYLATE-ATROPINE 2.5-0.025 MG PO TABS
1.0000 | ORAL_TABLET | Freq: Four times a day (QID) | ORAL | Status: DC | PRN
  Administered 2014-02-25 – 2014-02-28 (×2): 1 via ORAL
  Filled 2014-02-23 (×2): qty 1

## 2014-02-23 MED ORDER — DICYCLOMINE HCL 10 MG PO CAPS
10.0000 mg | ORAL_CAPSULE | Freq: Three times a day (TID) | ORAL | Status: DC
  Administered 2014-02-23 – 2014-03-01 (×14): 10 mg via ORAL
  Filled 2014-02-23 (×29): qty 1

## 2014-02-23 NOTE — Progress Notes (Signed)
Chart reviewed.   [name] PROGRESS NOTE  Julian [name] YKZ:[id number] DOB: 1946-03-26 DOA: 02/18/2014 PCP: Vic Blackbird, MD  Assessment and plan: Principal Problem:   Sepsis Active Problems:   End-stage renal disease on hemodialysis   Foot ulcer, left   CAD (coronary artery disease)   Cellulitis of left lower extremity   Hypotension   Diabetes mellitus type 2 with complications   Anemia of renal disease   Lactic acidosis   PVD (peripheral vascular disease)   Gangrene of lower extremity   1. Sepsis with lactic acidosis. Secondary to left lower extremity cellulitis with gangrene. Blood cultures are negative to date. Stable. continue vancomycin and Zosyn.  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 Trula Slade. Surgery likely Monday    Hypotension, secondary to sepsis and hypovolemia. Nontoxic appearing. Blood pressures borderline, but asymptomatic. Labetalol held. On midodrine.  Anemia of chronic kidney disease/end-stage renal disease.  Type 2 diabetes mellitus with nephropathy and neuropathy. Continue SSI  Coronary artery disease. Currently stable. The patient has no complaints of chest pain. Continue aspirin and statin. Medically optimized for surgery  ESRD: MWF schedule. Will notify nephrology  Code Status: Full code Family Communication:  Disposition Plan:   Consultants:  VVS  Nephrology  Procedures:  Hemodialysis  Antibiotics:  Vancomycin 10/12>>  Zosyn 10/12>>  Subjective No complaints.   Objective: Filed Vitals:   02/23/14 0542  BP: 92/50  Pulse: 77  Temp: 98.6 F (37 C)  Resp: 18   temperature 98.2. Pulse 82. Oxygen saturation 99% on room air.  Intake/Output Summary (Last 24 hours) at 02/23/14 0959 Last data filed at 02/23/14 0923  Gross per 24 hour  Intake    730 ml  Output    725 ml  Net      5 ml   Filed Weights   02/22/14 1100  02/22/14 1500 02/22/14 2053  Weight: 107.1 kg (236 lb 1.8 oz) 106.2 kg (234 lb 2.1 oz) 107.2 kg (236 lb 5.3 oz)    Exam:   General:  In chair. Alert, oriented. comfortable  Cardiovascular: S1, S2, with a soft systolic murmur.  Respiratory: Clear anteriorly with decreased breath sounds in the bases. Breathing is nonlabored.  Abdomen: Obese, positive bowel sounds, nontender, nondistended.  Musculoskeletal: Chronic right AKA/stump intact with no ulceration; left with dressing from foot to knee. Malodor present.   Data Reviewed: Basic Metabolic Panel:  Recent Labs Lab 02/18/14 0119 02/18/14 1205 02/19/14 0501 02/21/14 0522 02/22/14 0445  NA 136* 133* 137 138 137  K 4.4 4.5 4.6 5.4* 5.2  CL 92* 94* 95* 96 96  CO2 23 19 21 25 23   GLUCOSE 153* 126* 156* 109* 103*  BUN 37* 37* 45* 29* 37*  CREATININE 8.10* 8.58* 9.97* 7.15* 8.51*  CALCIUM 9.0 8.6 8.0* 8.5 8.6  PHOS  --   --  7.6* 5.7*  --    Liver Function Tests:  Recent Labs Lab 02/18/14 0119 02/21/14 0522  AST 24  --   ALT 24  --   ALKPHOS 78  --   BILITOT 0.3  --   PROT 7.1  --   ALBUMIN 2.3* 2.1*   No results found for this basename: LIPASE, AMYLASE,  in the last 168 hours No results found for this basename: AMMONIA,  in the last 168 hours CBC:  Recent Labs Lab 02/18/14 0119 02/19/14 0501 02/20/14 0527 02/22/14 0445  WBC 11.6* 8.1 7.9 12.4*  NEUTROABS  9.3*  --   --   --   HGB 8.3* 7.9* 8.3* 8.6*  HCT 25.7* 25.0* 25.5* 27.0*  MCV 95.2 96.2 94.1 95.4  PLT 153 145* 150 182   Cardiac Enzymes: No results found for this basename: CKTOTAL, CKMB, CKMBINDEX, TROPONINI,  in the last 168 hours BNP (last 3 results) No results found for this basename: PROBNP,  in the last 8760 hours CBG:  Recent Labs Lab 02/22/14 0741 02/22/14 1625 02/22/14 1833 02/22/14 2051 02/23/14 0708  GLUCAP 106* 168* 241* 164* 103*    Recent Results (from the past 240 hour(s))  CULTURE, BLOOD (ROUTINE X 2)     Status: None    Collection Time    02/18/14  1:22 AM      Result Value Ref Range Status   Specimen Description BLOOD LEFT ARM   Final   Special Requests BOTTLES DRAWN AEROBIC ONLY 6CC   Final   Culture NO GROWTH 5 DAYS   Final   Report Status 02/23/2014 FINAL   Final  CULTURE, BLOOD (ROUTINE X 2)     Status: None   Collection Time    02/18/14  1:36 AM      Result Value Ref Range Status   Specimen Description BLOOD LEFT HAND   Final   Special Requests BOTTLES DRAWN AEROBIC ONLY 6CC   Final   Culture NO GROWTH 5 DAYS   Final   Report Status 02/23/2014 FINAL   Final  MRSA PCR SCREENING     Status: None   Collection Time    02/18/14  4:40 AM      Result Value Ref Range Status   MRSA by PCR NEGATIVE  NEGATIVE Final   Comment:            The GeneXpert MRSA Assay (FDA     approved for NASAL specimens     only), is one component of a     comprehensive MRSA colonization     surveillance program. It is not     intended to diagnose MRSA     infection nor to guide or     monitor treatment for     MRSA infections.     Studies: No results found.  Scheduled Meds: . aspirin EC  81 mg Oral Daily  . atorvastatin  80 mg Oral QPM  . cinacalcet  120 mg Oral Q lunch  . collagenase   Topical Daily  . enoxaparin (LOVENOX) injection  30 mg Subcutaneous Q24H  . epoetin alfa  14,000 Units Intravenous Q M,W,F-HD  . insulin aspart  0-5 Units Subcutaneous QHS  . insulin aspart  0-9 Units Subcutaneous TID WC  . midodrine  5 mg Oral TID WC  . multivitamin  1 tablet Oral QHS  . piperacillin-tazobactam (ZOSYN)  IV  2.25 g Intravenous 3 times per day  . sevelamer carbonate  2,400 mg Oral TID WC  . sodium chloride  3 mL Intravenous Q12H  . vancomycin  1,000 mg Intravenous Q M,W,F-HD   Continuous Infusions:   Time spent: 25 minutes  Delfina Redwood, MD [name] Pager [phone]. If 7PM-7AM, please contact night-coverage at [email or web address], password [facility] 02/23/2014, 9:59 AM  LOS: 5 days

## 2014-02-23 NOTE — Progress Notes (Signed)
ANTIBIOTIC CONSULT NOTE - FOLLOW UP  Pharmacy Consult for Vancomycin & Zosyn Indication: sepsis, PNA, cellulitis  No Known Allergies  Patient Measurements: Height: 5\' 10"  (177.8 cm) Weight: 236 lb 5.3 oz (107.2 kg) IBW/kg (Calculated) : 73  Vital Signs: Temp: 98.6 F (37 C) (10/17 1027) Temp Source: Oral (10/17 1027) BP: 52/16 mmHg (10/17 1027) Pulse Rate: 93 (10/17 1027) Intake/Output from previous day: 10/16 0701 - 10/17 0700 In: 370 [P.O.:120; IV Piggyback:250] Out: 725 [Stool:1] Intake/Output from this shift: Total I/O In: 720 [P.O.:720] Out: 0   Labs:  Recent Labs  02/21/14 0522 02/22/14 0445  WBC  --  12.4*  HGB  --  8.6*  PLT  --  182  CREATININE 7.15* 8.51*   Estimated Creatinine Clearance: 10.3 ml/min (by C-G formula based on Cr of 8.51). No results found for this basename: VANCOTROUGH, Corlis Leak, VANCORANDOM, [place], GENTPEAK, GENTRANDOM, TOBRATROUGH, TOBRAPEAK, TOBRARND, AMIKACINPEAK, AMIKACINTROU, AMIKACIN,  in the last 72 hours   Microbiology: Recent Results (from the past 720 hour(s))  CULTURE, BLOOD (ROUTINE X 2)     Status: None   Collection Time    02/18/14  1:22 AM      Result Value Ref Range Status   Specimen Description BLOOD LEFT ARM   Final   Special Requests BOTTLES DRAWN AEROBIC ONLY [place]   Final   Culture NO GROWTH 5 DAYS   Final   Report Status 02/23/2014 FINAL   Final  CULTURE, BLOOD (ROUTINE X 2)     Status: None   Collection Time    02/18/14  1:36 AM      Result Value Ref Range Status   Specimen Description BLOOD LEFT HAND   Final   Special Requests BOTTLES DRAWN AEROBIC ONLY [place]   Final   Culture NO GROWTH 5 DAYS   Final   Report Status 02/23/2014 FINAL   Final  MRSA PCR SCREENING     Status: None   Collection Time    02/18/14  4:40 AM      Result Value Ref Range Status   MRSA by PCR NEGATIVE  NEGATIVE Final   Comment:            The GeneXpert MRSA Assay (FDA     approved for NASAL specimens     only), is one  component of a     comprehensive MRSA colonization     surveillance program. It is not     intended to diagnose MRSA     infection nor to guide or     monitor treatment for     MRSA infections.    Anti-infectives   Start     Dose/Rate Route Frequency Ordered Stop   02/18/14 1400  piperacillin-tazobactam (ZOSYN) IVPB 2.25 g     2.25 g 100 mL/hr over 30 Minutes Intravenous 3 times per day 02/18/14 0752     02/18/14 1200  vancomycin (VANCOCIN) IVPB 1000 mg/200 mL premix     1,000 mg 200 mL/hr over 60 Minutes Intravenous Every M-W-F (Hemodialysis) 02/18/14 1101     02/18/14 0500  piperacillin-tazobactam (ZOSYN) IVPB 2.25 g     2.25 g 100 mL/hr over 30 Minutes Intravenous  Once 02/18/14 0453 02/18/14 0631   02/18/14 0315  vancomycin (VANCOCIN) IVPB 1000 mg/200 mL premix     2,000 mg 400 mL/hr over 60 Minutes Intravenous  Once 02/18/14 0313 02/18/14 0420   02/18/14 0300  piperacillin-tazobactam (ZOSYN) IVPB 4.5 g  Status:  Discontinued  4.5 g 200 mL/hr over 30 Minutes Intravenous  Once 02/18/14 0255 02/18/14 0553      Assessment: 49 yoM with ESRD requiring HD on MWF & hx DM, PVD s/p R AKA.  He presented with cough & not feeling well.   WBC is currently elevated at 12.4 and Tm 99.65F Pt found to have left lower extremity cellulitis with gangrene. Surgery planned for Monday. He did not tolerate his HD session yesterday due to hypotension and only got 724 cc of UF removed per RN.   Vanc 10/12>> Zosyn 10/12>>   10/12 Blood Cx x2>> neg   Goal of Therapy:  Eradicate infection. Vancomycin pre-HD level 15-25 mcg/ml  Plan:  Zosyn 2.25gm IV q8h Vancomycin 1000mg  IV qHD (MWF) Check pre-HD level on Monday  F/U cx data & patient progress  Albertina Parr, PharmD.  Clinical Pharmacist Pager [phone]

## 2014-02-24 DIAGNOSIS — I70244 Atherosclerosis of native arteries of left leg with ulceration of heel and midfoot: Secondary | ICD-10-CM

## 2014-02-24 LAB — RENAL FUNCTION PANEL
ALBUMIN: 2 g/dL — AB (ref 3.5–5.2)
Anion gap: 19 — ABNORMAL HIGH (ref 5–15)
BUN: 24 mg/dL — ABNORMAL HIGH (ref 6–23)
CALCIUM: 8.6 mg/dL (ref 8.4–10.5)
CO2: 21 mEq/L (ref 19–32)
CREATININE: 7.26 mg/dL — AB (ref 0.50–1.35)
Chloride: 94 mEq/L — ABNORMAL LOW (ref 96–112)
GFR calc Af Amer: 8 mL/min — ABNORMAL LOW (ref >90)
GFR, EST NON AFRICAN AMERICAN: 7 mL/min — AB (ref >90)
Glucose, Bld: 137 mg/dL — ABNORMAL HIGH (ref 70–99)
PHOSPHORUS: 5.5 mg/dL — AB (ref 2.3–4.6)
Potassium: 4.4 mEq/L (ref 3.7–5.3)
Sodium: 134 mEq/L — ABNORMAL LOW (ref 137–147)

## 2014-02-24 LAB — CBC
HCT: 26.6 % — ABNORMAL LOW (ref 39.0–52.0)
Hemoglobin: 8.4 g/dL — ABNORMAL LOW (ref 13.0–17.0)
MCH: 30.4 pg (ref 26.0–34.0)
MCHC: 31.6 g/dL (ref 30.0–36.0)
MCV: 96.4 fL (ref 78.0–100.0)
PLATELETS: 140 10*3/uL — AB (ref 150–400)
RBC: 2.76 MIL/uL — ABNORMAL LOW (ref 4.22–5.81)
RDW: 16.9 % — AB (ref 11.5–15.5)
WBC: 9.7 10*3/uL (ref 4.0–10.5)

## 2014-02-24 LAB — GLUCOSE, CAPILLARY
GLUCOSE-CAPILLARY: 115 mg/dL — AB (ref 70–99)
GLUCOSE-CAPILLARY: 111 mg/dL — AB (ref 70–99)
Glucose-Capillary: 141 mg/dL — ABNORMAL HIGH (ref 70–99)
Glucose-Capillary: 151 mg/dL — ABNORMAL HIGH (ref 70–99)

## 2014-02-24 LAB — SURGICAL PCR SCREEN
MRSA, PCR: NEGATIVE
Staphylococcus aureus: NEGATIVE

## 2014-02-24 MED ORDER — SODIUM CHLORIDE 0.9 % IV BOLUS (SEPSIS)
250.0000 mL | Freq: Once | INTRAVENOUS | Status: AC
  Administered 2014-02-24: 250 mL via INTRAVENOUS

## 2014-02-24 MED ORDER — OXYCODONE HCL 5 MG PO TABS
10.0000 mg | ORAL_TABLET | ORAL | Status: DC | PRN
  Administered 2014-02-25 – 2014-02-28 (×6): 10 mg via ORAL
  Filled 2014-02-24 (×6): qty 2
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
## 2014-02-24 NOTE — Progress Notes (Signed)
BP 84/45 at 17:44.  MD notified who ordered 263mL of NS bolus.  Repeat blood pressure was 86/24.  Will continue to monitor.

## 2014-02-24 NOTE — Progress Notes (Signed)
D/w [name], [place]. [name] PROGRESS NOTE  Julian [name] WNU:[id number] DOB: 02/22/1946 DOA: 02/18/2014 PCP: Julian Blackbird, MD  Assessment and plan:  1. Sepsis with lactic acidosis. Secondary to left lower extremity cellulitis with gangrene. Blood cultures are negative to date. Stable. continue vancomycin and Zosyn.  Chronic left lower extremity vascular/neuropathic ulcers of the left lower extremity now with gangrene Surgery Monday  Hypotension, secondary to sepsis and hypovolemia. Nontoxic appearing. Blood pressures borderline, but asymptomatic. Labetalol held. On midodrine.  Suspect yesterday's low erroneous  Anemia of chronic kidney disease/end-stage renal disease.  Type 2 diabetes mellitus with nephropathy and neuropathy. Continue SSI  Coronary artery disease. Currently stable. The patient has no complaints of chest pain. Continue aspirin and statin. Medically optimized for surgery  ESRD: MWF schedule. Nephrology consulted  Code Status: Full code Family Communication:  Disposition Plan:   Consultants:  [facility]  Nephrology  Procedures:  Hemodialysis  Antibiotics:  Vancomycin 10/12>>  Zosyn 10/12>>  Subjective C/o leg pain  Objective: Filed Vitals:   02/24/14 0523  BP: 92/42  Pulse: 73  Temp: 98.4 F (36.9 C)  Resp: 18   temperature 98.2. Pulse 82. Oxygen saturation 99% on room air.  Intake/Output Summary (Last 24 hours) at 02/24/14 0859 Last data filed at 02/24/14 0556  Gross per 24 hour  Intake   1350 ml  Output      0 ml  Net   1350 ml   Filed Weights   02/22/14 1500 02/22/14 2053 02/23/14 2131  Weight: 106.2 kg (234 lb 2.1 oz) 107.2 kg (236 lb 5.3 oz) 109.3 kg (240 lb 15.4 oz)    Exam:   General:  In chair. Alert, oriented. Talking on phone  Cardiovascular: S1, S2, with a soft systolic murmur.  Respiratory: Clear anteriorly with decreased breath sounds in the bases. Breathing is nonlabored.  Abdomen: Obese, positive  bowel sounds, nontender, nondistended. Musculoskeletal: Chronic right AKA/stump intact with no ulceration; left with dressing from foot to knee. Gangrenous great toe  Data Reviewed: Basic Metabolic Panel:  Recent Labs Lab 02/18/14 1205 02/19/14 0501 02/21/14 0522 02/22/14 0445 02/24/14 0535  NA 133* 137 138 137 134*  K 4.5 4.6 5.4* 5.2 4.4  CL 94* 95* 96 96 94*  CO2 19 21 25 23 21   GLUCOSE 126* 156* 109* 103* 137*  BUN 37* 45* 29* 37* 24*  CREATININE 8.58* 9.97* 7.15* 8.51* 7.26*  CALCIUM 8.6 8.0* 8.5 8.6 8.6  PHOS  --  7.6* 5.7*  --  5.5*   Liver Function Tests:  Recent Labs Lab 02/18/14 0119 02/21/14 0522 02/24/14 0535  AST 24  --   --   ALT 24  --   --   ALKPHOS 78  --   --   BILITOT 0.3  --   --   PROT 7.1  --   --   ALBUMIN 2.3* 2.1* 2.0*   No results found for this basename: LIPASE, AMYLASE,  in the last 168 hours No results found for this basename: AMMONIA,  in the last 168 hours CBC:  Recent Labs Lab 02/18/14 0119 02/19/14 0501 02/20/14 0527 02/22/14 0445 02/24/14 0535  WBC 11.6* 8.1 7.9 12.4* 9.7  NEUTROABS 9.3*  --   --   --   --   HGB 8.3* 7.9* 8.3* 8.6* 8.4*  HCT 25.7* 25.0* 25.5* 27.0* 26.6*  MCV 95.2 96.2 94.1 95.4 96.4  PLT 153 145* 150 182 140*   Cardiac Enzymes: No results found for this basename:  CKTOTAL, CKMB, CKMBINDEX, TROPONINI,  in the last 168 hours BNP (last 3 results) No results found for this basename: PROBNP,  in the last 8760 hours CBG:  Recent Labs Lab 02/23/14 0708 02/23/14 1201 02/23/14 1652 02/23/14 2130 02/24/14 0733  GLUCAP 103* 137* 155* 142* 141*    Recent Results (from the past 240 hour(s))  CULTURE, BLOOD (ROUTINE X 2)     Status: None   Collection Time    02/18/14  1:22 AM      Result Value Ref Range Status   Specimen Description BLOOD LEFT ARM   Final   Special Requests BOTTLES DRAWN AEROBIC ONLY 6CC   Final   Culture NO GROWTH 5 DAYS   Final   Report Status 02/23/2014 FINAL   Final  CULTURE,  BLOOD (ROUTINE X 2)     Status: None   Collection Time    02/18/14  1:36 AM      Result Value Ref Range Status   Specimen Description BLOOD LEFT HAND   Final   Special Requests BOTTLES DRAWN AEROBIC ONLY [place]   Final   Culture NO GROWTH 5 DAYS   Final   Report Status 02/23/2014 FINAL   Final  MRSA PCR SCREENING     Status: None   Collection Time    02/18/14  4:40 AM      Result Value Ref Range Status   MRSA by PCR NEGATIVE  NEGATIVE Final   Comment:            The GeneXpert MRSA Assay (FDA     approved for NASAL specimens     only), is one component of a     comprehensive MRSA colonization     surveillance program. It is not     intended to diagnose MRSA     infection nor to guide or     monitor treatment for     MRSA infections.     Studies: No results found.  Scheduled Meds: . aspirin EC  81 mg Oral Daily  . atorvastatin  80 mg Oral QPM  . cinacalcet  120 mg Oral Q lunch  . dicyclomine  10 mg Oral TID AC & HS  . enoxaparin (LOVENOX) injection  30 mg Subcutaneous Q24H  . epoetin alfa  14,000 Units Intravenous Q M,W,F-HD  . insulin aspart  0-5 Units Subcutaneous QHS  . insulin aspart  0-9 Units Subcutaneous TID WC  . midodrine  5 mg Oral TID WC  . multivitamin  1 tablet Oral QHS  . piperacillin-tazobactam (ZOSYN)  IV  2.25 g Intravenous 3 times per day  . sevelamer carbonate  2,400 mg Oral TID WC  . sodium chloride  3 mL Intravenous Q12H  . vancomycin  1,000 mg Intravenous Q M,W,F-HD   Continuous Infusions:   Time spent: 25 minutes  Julian Redwood, MD [name] Pager [phone]. If 7PM-7AM, please contact night-coverage at [email or web address], password [facility] 02/24/2014, 8:59 AM  LOS: 6 days
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
## 2014-02-24 NOTE — Progress Notes (Signed)
Patient had 6-beat run of v-tach.  Asymptomatic.  Vitals obtained and recorded.

## 2014-02-25 ENCOUNTER — Inpatient Hospital Stay (HOSPITAL_COMMUNITY): Payer: PRIVATE HEALTH INSURANCE | Admitting: Certified Registered Nurse Anesthetist

## 2014-02-25 ENCOUNTER — Encounter (HOSPITAL_COMMUNITY)
Admission: EM | Disposition: A | Discharge status: Skilled Nursing Facility | Payer: Self-pay | Source: Home / Self Care | Attending: Internal Medicine

## 2014-02-25 ENCOUNTER — Encounter (HOSPITAL_COMMUNITY): Payer: PRIVATE HEALTH INSURANCE | Admitting: Certified Registered Nurse Anesthetist

## 2014-02-25 ENCOUNTER — Encounter (HOSPITAL_COMMUNITY): Payer: Self-pay | Admitting: Certified Registered Nurse Anesthetist

## 2014-02-25 HISTORY — PX: AMPUTATION: SHX166

## 2014-02-25 LAB — GLUCOSE, CAPILLARY
GLUCOSE-CAPILLARY: 92 mg/dL (ref 70–99)
Glucose-Capillary: 107 mg/dL — ABNORMAL HIGH (ref 70–99)
Glucose-Capillary: 96 mg/dL (ref 70–99)
Glucose-Capillary: 138 mg/dL — ABNORMAL HIGH (ref 70–99)

## 2014-02-25 LAB — RENAL FUNCTION PANEL
Albumin: 2 g/dL — ABNORMAL LOW (ref 3.5–5.2)
Anion gap: 21 — ABNORMAL HIGH (ref 5–15)
BUN: 30 mg/dL — AB (ref 6–23)
CALCIUM: 8.1 mg/dL — AB (ref 8.4–10.5)
CO2: 22 meq/L (ref 19–32)
Chloride: 93 mEq/L — ABNORMAL LOW (ref 96–112)
Creatinine, Ser: 8.61 mg/dL — ABNORMAL HIGH (ref 0.50–1.35)
GFR calc Af Amer: 7 mL/min — ABNORMAL LOW (ref >90)
GFR calc non Af Amer: 6 mL/min — ABNORMAL LOW (ref >90)
GLUCOSE: 105 mg/dL — AB (ref 70–99)
Phosphorus: 6.5 mg/dL — ABNORMAL HIGH (ref 2.3–4.6)
Potassium: 5 mEq/L (ref 3.7–5.3)
Sodium: 136 mEq/L — ABNORMAL LOW (ref 137–147)

## 2014-02-25 LAB — CBC
HCT: 25.5 % — ABNORMAL LOW (ref 39.0–52.0)
HEMOGLOBIN: 8.1 g/dL — AB (ref 13.0–17.0)
MCH: 30.5 pg (ref 26.0–34.0)
MCHC: 31.8 g/dL (ref 30.0–36.0)
MCV: 95.9 fL (ref 78.0–100.0)
Platelets: 135 10*3/uL — ABNORMAL LOW (ref 150–400)
RBC: 2.66 MIL/uL — AB (ref 4.22–5.81)
RDW: 16.9 % — ABNORMAL HIGH (ref 11.5–15.5)
WBC: 8.9 10*3/uL (ref 4.0–10.5)

## 2014-02-25 LAB — PREPARE RBC (CROSSMATCH)

## 2014-02-25 LAB — VANCOMYCIN, RANDOM: VANCOMYCIN RM: 18.8 ug/mL

## 2014-02-25 LAB — ABO/RH: ABO/RH(D): O POS

## 2014-02-25 SURGERY — AMPUTATION, ABOVE KNEE
Anesthesia: General | Site: Leg Upper | Laterality: Left

## 2014-02-25 MED ORDER — ONDANSETRON HCL 4 MG/2ML IJ SOLN
INTRAMUSCULAR | Status: DC | PRN
  Administered 2014-02-25: 4 mg via INTRAVENOUS

## 2014-02-25 MED ORDER — LABETALOL HCL 5 MG/ML IV SOLN: 10.0000 mg | INTRAVENOUS | Status: DC | PRN

## 2014-02-25 MED ORDER — LIDOCAINE HCL (CARDIAC) 20 MG/ML IV SOLN
INTRAVENOUS | Status: AC
  Filled 2014-02-25: qty 5

## 2014-02-25 MED ORDER — FENTANYL CITRATE 0.05 MG/ML IJ SOLN
INTRAMUSCULAR | Status: AC
  Filled 2014-02-25: qty 5

## 2014-02-25 MED ORDER — PENTAFLUOROPROP-TETRAFLUOROETH EX AERO: 1.0000 "application " | INHALATION_SPRAY | CUTANEOUS | Status: DC | PRN

## 2014-02-25 MED ORDER — ALTEPLASE 2 MG IJ SOLR
2.0000 mg | Freq: Once | INTRAMUSCULAR | Status: DC | PRN
  Filled 2014-02-25: qty 2

## 2014-02-25 MED ORDER — LIDOCAINE HCL 1 % IJ SOLN
INTRAMUSCULAR | Status: DC | PRN
  Administered 2014-02-25: 100 mg via INTRADERMAL

## 2014-02-25 MED ORDER — HYDROMORPHONE HCL 1 MG/ML IJ SOLN: 0.2500 mg | INTRAMUSCULAR | Status: DC | PRN

## 2014-02-25 MED ORDER — DOCUSATE SODIUM 100 MG PO CAPS
100.0000 mg | ORAL_CAPSULE | Freq: Every day | ORAL | Status: DC
  Administered 2014-02-26 – 2014-02-28 (×3): 100 mg via ORAL
  Filled 2014-02-25 (×4): qty 1

## 2014-02-25 MED ORDER — NEPRO/CARBSTEADY PO LIQD: 237.0000 mL | ORAL | Status: DC | PRN

## 2014-02-25 MED ORDER — PROPOFOL 10 MG/ML IV BOLUS
INTRAVENOUS | Status: DC | PRN
  Administered 2014-02-25: 20 mg via INTRAVENOUS
  Administered 2014-02-25: 120 mg via INTRAVENOUS
  Administered 2014-02-25: 20 mg via INTRAVENOUS

## 2014-02-25 MED ORDER — METOPROLOL TARTRATE 1 MG/ML IV SOLN
2.0000 mg | INTRAVENOUS | Status: DC | PRN
  Filled 2014-02-25 (×2): qty 5

## 2014-02-25 MED ORDER — HYDRALAZINE HCL 20 MG/ML IJ SOLN: 10.0000 mg | INTRAMUSCULAR | Status: DC | PRN

## 2014-02-25 MED ORDER — ONDANSETRON HCL 4 MG/2ML IJ SOLN
INTRAMUSCULAR | Status: AC
  Filled 2014-02-25: qty 2

## 2014-02-25 MED ORDER — SODIUM CHLORIDE 0.9 % IV SOLN: 100.0000 mL | INTRAVENOUS | Status: DC | PRN

## 2014-02-25 MED ORDER — LIDOCAINE HCL (PF) 1 % IJ SOLN: 5.0000 mL | INTRAMUSCULAR | Status: DC | PRN

## 2014-02-25 MED ORDER — MAGNESIUM SULFATE 40 MG/ML IJ SOLN
2.0000 g | Freq: Every day | INTRAMUSCULAR | Status: DC | PRN
  Filled 2014-02-25: qty 50

## 2014-02-25 MED ORDER — PIPERACILLIN-TAZOBACTAM IN DEX 2-0.25 GM/50ML IV SOLN
2.2500 g | INTRAVENOUS | Status: AC
  Administered 2014-02-25: 2.25 g via INTRAVENOUS
  Filled 2014-02-25: qty 50

## 2014-02-25 MED ORDER — PHENYLEPHRINE HCL 10 MG/ML IJ SOLN
INTRAMUSCULAR | Status: DC | PRN
  Administered 2014-02-25: 120 ug via INTRAVENOUS
  Administered 2014-02-25: 80 ug via INTRAVENOUS

## 2014-02-25 MED ORDER — PHENOL 1.4 % MT LIQD: 1.0000 | OROMUCOSAL | Status: DC | PRN

## 2014-02-25 MED ORDER — ALBUMIN HUMAN 5 % IV SOLN
INTRAVENOUS | Status: DC | PRN
  Administered 2014-02-25: 15:00:00 via INTRAVENOUS

## 2014-02-25 MED ORDER — SODIUM CHLORIDE 0.9 % IV SOLN
INTRAVENOUS | Status: DC | PRN
  Administered 2014-02-25: 14:00:00 via INTRAVENOUS

## 2014-02-25 MED ORDER — PANTOPRAZOLE SODIUM 40 MG PO TBEC
40.0000 mg | DELAYED_RELEASE_TABLET | Freq: Every day | ORAL | Status: DC
  Administered 2014-02-25 – 2014-02-28 (×4): 40 mg via ORAL
  Filled 2014-02-25 (×5): qty 1

## 2014-02-25 MED ORDER — ARTIFICIAL TEARS OP OINT
TOPICAL_OINTMENT | OPHTHALMIC | Status: AC
  Filled 2014-02-25: qty 3.5

## 2014-02-25 MED ORDER — MIDODRINE HCL 5 MG PO TABS
ORAL_TABLET | ORAL | Status: AC
  Filled 2014-02-25: qty 1

## 2014-02-25 MED ORDER — GUAIFENESIN-DM 100-10 MG/5ML PO SYRP
15.0000 mL | ORAL_SOLUTION | ORAL | Status: DC | PRN
  Filled 2014-02-25: qty 15

## 2014-02-25 MED ORDER — LIDOCAINE-PRILOCAINE 2.5-2.5 % EX CREA: 1.0000 "application " | TOPICAL_CREAM | CUTANEOUS | Status: DC | PRN

## 2014-02-25 MED ORDER — FENTANYL CITRATE 0.05 MG/ML IJ SOLN
INTRAMUSCULAR | Status: DC | PRN
  Administered 2014-02-25: 100 ug via INTRAVENOUS

## 2014-02-25 MED ORDER — POTASSIUM CHLORIDE CRYS ER 20 MEQ PO TBCR: 20.0000 meq | EXTENDED_RELEASE_TABLET | Freq: Every day | ORAL | Status: DC | PRN

## 2014-02-25 MED ORDER — ARTIFICIAL TEARS OP OINT
TOPICAL_OINTMENT | OPHTHALMIC | Status: DC | PRN
  Administered 2014-02-25: 1 via OPHTHALMIC

## 2014-02-25 MED ORDER — HEPARIN SODIUM (PORCINE) 1000 UNIT/ML DIALYSIS: 1000.0000 [IU] | INTRAMUSCULAR | Status: DC | PRN

## 2014-02-25 MED ORDER — 0.9 % SODIUM CHLORIDE (POUR BTL) OPTIME
TOPICAL | Status: DC | PRN
  Administered 2014-02-25: 1000 mL

## 2014-02-25 MED ORDER — PROMETHAZINE HCL 25 MG/ML IJ SOLN: 6.2500 mg | INTRAMUSCULAR | Status: DC | PRN

## 2014-02-25 MED ORDER — ALUM & MAG HYDROXIDE-SIMETH 200-200-20 MG/5ML PO SUSP: 15.0000 mL | ORAL | Status: DC | PRN

## 2014-02-25 MED ORDER — EPHEDRINE SULFATE 50 MG/ML IJ SOLN
INTRAMUSCULAR | Status: DC | PRN
  Administered 2014-02-25: 10 mg via INTRAVENOUS

## 2014-02-25 SURGICAL SUPPLY — 44 items
BANDAGE ELASTIC 6 VELCRO ST LF (GAUZE/BANDAGES/DRESSINGS) ×4 IMPLANT
BLADE SAW RECIP 87.9 MT (BLADE) ×2 IMPLANT
BNDG COHESIVE 4X5 TAN STRL (GAUZE/BANDAGES/DRESSINGS) ×2 IMPLANT
BNDG COHESIVE 6X5 TAN STRL LF (GAUZE/BANDAGES/DRESSINGS) ×2 IMPLANT
BNDG GAUZE ELAST 4 BULKY (GAUZE/BANDAGES/DRESSINGS) ×4 IMPLANT
CANISTER SUCTION 2500CC (MISCELLANEOUS) ×2 IMPLANT
CLIP TI MEDIUM 6 (CLIP) ×2 IMPLANT
COVER SURGICAL LIGHT HANDLE (MISCELLANEOUS) ×2 IMPLANT
COVER TABLE BACK 60X90 (DRAPES) ×2 IMPLANT
DRAIN CHANNEL 19F RND (DRAIN) IMPLANT
DRAPE ORTHO SPLIT 77X108 STRL (DRAPES) ×2
DRAPE PROXIMA HALF (DRAPES) ×2 IMPLANT
DRAPE SURG ORHT 6 SPLT 77X108 (DRAPES) ×2 IMPLANT
DRSG ADAPTIC 3X8 NADH LF (GAUZE/BANDAGES/DRESSINGS) ×2 IMPLANT
ELECT REM PT RETURN 9FT ADLT (ELECTROSURGICAL) ×2
ELECTRODE REM PT RTRN 9FT ADLT (ELECTROSURGICAL) ×1 IMPLANT
EVACUATOR SILICONE 100CC (DRAIN) IMPLANT
GAUZE SPONGE 4X4 12PLY STRL (GAUZE/BANDAGES/DRESSINGS) ×2 IMPLANT
GLOVE BIO SURGEON STRL SZ7.5 (GLOVE) ×2 IMPLANT
GLOVE BIOGEL PI IND STRL 7.5 (GLOVE) ×1 IMPLANT
GLOVE BIOGEL PI INDICATOR 7.5 (GLOVE) ×1
GLOVE SURG SS PI 7.5 STRL IVOR (GLOVE) ×2 IMPLANT
GOWN STRL REUS W/ TWL LRG LVL3 (GOWN DISPOSABLE) ×3 IMPLANT
GOWN STRL REUS W/TWL LRG LVL3 (GOWN DISPOSABLE) ×3
KIT BASIN OR (CUSTOM PROCEDURE TRAY) ×2 IMPLANT
KIT ROOM TURNOVER OR (KITS) ×2 IMPLANT
NS IRRIG 1000ML POUR BTL (IV SOLUTION) ×2 IMPLANT
PACK GENERAL/GYN (CUSTOM PROCEDURE TRAY) ×2 IMPLANT
PAD ARMBOARD 7.5X6 YLW CONV (MISCELLANEOUS) ×4 IMPLANT
SPONGE GAUZE 4X4 12PLY STER LF (GAUZE/BANDAGES/DRESSINGS) ×2 IMPLANT
STAPLER VISISTAT 35W (STAPLE) ×2 IMPLANT
STOCKINETTE IMPERVIOUS LG (DRAPES) ×2 IMPLANT
SUT ETHILON 3 0 PS 1 (SUTURE) IMPLANT
SUT SILK 2 0 SH (SUTURE) IMPLANT
SUT SILK 2 0 TIES 10X30 (SUTURE) ×2 IMPLANT
SUT SILK 2 0SH CR/8 30 (SUTURE) ×2 IMPLANT
SUT VIC AB 2-0 CT1 18 (SUTURE) ×4 IMPLANT
SUT VIC AB 2-0 SH 18 (SUTURE) ×4 IMPLANT
SUT VIC AB 3-0 SH 27 (SUTURE) ×2
SUT VIC AB 3-0 SH 27X BRD (SUTURE) ×2 IMPLANT
TOWEL OR 17X24 6PK STRL BLUE (TOWEL DISPOSABLE) ×2 IMPLANT
TOWEL OR 17X26 10 PK STRL BLUE (TOWEL DISPOSABLE) ×2 IMPLANT
UNDERPAD 30X30 INCONTINENT (UNDERPADS AND DIAPERS) ×2 IMPLANT
WATER STERILE IRR 1000ML POUR (IV SOLUTION) ×2 IMPLANT

## 2014-02-25 NOTE — Consult Note (Signed)
  [facility] Progress Note    Subjective:   No new complaints. Ready for left leg amputation today.   Objective:   BP 126/72  Pulse 77  Temp(Src) 99 F (37.2 C) (Oral)  Resp 15  Ht 5\' 10"  (1.778 m)  Wt 239 lb 10.2 oz (108.7 kg)  BMI 34.38 kg/m2  SpO2 100%  Intake/Output: I/O last 3 completed shifts: In: 1230 [P.O.:1080; IV Piggyback:150] Out: -    Intake/Output this shift:  Total I/O In: -  Out: 1471 [Other:1471] Weight change: -10.6 oz (-0.3 kg)  Physical Exam: Gen: NAD, older gentleman lying in bed CVS:RRR, no MRG Resp:CTAB MEQ:ASTM, NTND, +BS HDQ:QIWL leg dressed with black/necrotic great toe visible  Labs: BMET  Recent Labs Lab 02/19/14 0501 02/21/14 0522 02/22/14 0445 02/24/14 0535 02/25/14 0800  NA 137 138 137 134* 136*  K 4.6 5.4* 5.2 4.4 5.0  CL 95* 96 96 94* 93*  CO2 21 25 23 21 22   GLUCOSE 156* 109* 103* 137* 105*  BUN 45* 29* 37* 24* 30*  CREATININE 9.97* 7.15* 8.51* 7.26* 8.61*  ALBUMIN  --  2.1*  --  2.0* 2.0*  CALCIUM 8.0* 8.5 8.6 8.6 8.1*  PHOS 7.6* 5.7*  --  5.5* 6.5*   CBC  Recent Labs Lab 02/20/14 0527 02/22/14 0445 02/24/14 0535 02/25/14 0800  WBC 7.9 12.4* 9.7 8.9  HGB 8.3* 8.6* 8.4* 8.1*  HCT 25.5* 27.0* 26.6* 25.5*  MCV 94.1 95.4 96.4 95.9  PLT 150 182 140* 135*    Medications:    . [facility] HOLD] aspirin EC  81 mg Oral Daily  . [facility] HOLD] atorvastatin  80 mg Oral QPM  . [MAR HOLD] cinacalcet  120 mg Oral Q lunch  . [facility] HOLD] dicyclomine  10 mg Oral TID AC & HS  . [MAR HOLD] enoxaparin (LOVENOX) injection  30 mg Subcutaneous Q24H  . [facility] HOLD] epoetin alfa  14,000 Units Intravenous Q M,W,F-HD  . [MAR HOLD] insulin aspart  0-9 Units Subcutaneous TID WC  . [facility] HOLD] midodrine  5 mg Oral TID WC  . [MAR HOLD] multivitamin  1 tablet Oral QHS  . [MAR HOLD] piperacillin-tazobactam (ZOSYN)  IV  2.25 g Intravenous 3 times per day  . [facility] HOLD] sevelamer carbonate  2,400 mg Oral TID WC  . [MAR HOLD] sodium  chloride  3 mL Intravenous Q12H  . [facility] HOLD] vancomycin  1,000 mg Intravenous Q M,W,F-HD     Assessment/ Plan:   1. Sepsis/left LE gangrene - failed conservative mgt, now afebrile on Vancomycin & Zosyn since 10/12, L LE amputation today per Dr. Oneida Alar. 2. ESRD - HD on MWF @ [facility] in [place], K 4.4. HD today.  3. Hypotension/volume - BP 99/23, on Midodrine 5 mg tid; wt 109.3 kg, no signs or symptoms of fluid overload. 4. Anemia - Hgb 8.4, Epogen 14,000 U per HD. 5. Metabolic bone disease - Ca 8.6 (10.2 corrected), P 5.5; Sensipar 120 mcg qd, Patient refusing renvela 6. Severe protein calorie malnutrition - Alb 2, renal diet, multivitamin. 7. DM Type 2 - insulin per primary. 8. Hx PVD - s/p R AKA 9. CAD - s/p PCI with DES to distal LCx 09/2013.  Beverlyn Roux 02/25/2014, 2:21 PM  Roshaunda Starkey C

## 2014-02-25 NOTE — Op Note (Signed)
[facility] OPERATIVE NOTE   Procedure: Left above knee amputation  Surgeon(s): Elam Dutch, MD  ASSISTANT: Silva Bandy, PA-C  Anesthesia: General  Specimens: Left leg  PROCEDURE DETAIL: After obtaining informed consent, the patient was taken to the operating room. The patient was placed in supine position the operating room table. After induction of general anesthesia and endotracheal intubation the patient's entire left lower extremity was prepped and draped in usual sterile fashion. A circumferential incision was made on the left leg just above the knee. The incision was carried down into the sucutaneous tissues down to level the saphenous vein. This was ligated and divided between silk ties. Soft tissues were taken down as well as the muscle and fascia with cautery. The superficial femoral artery and vein were dissected free circumferentially clamped and divided. These were suture ligated proximally. Remainder of the soft tissues were taken down with cautery. The periosteum was raised on the femur approximately 5 cm above the skin edge. The femur was divided at this level. The leg was passed off the table as a specimen. Hemostasis was obtained. The wound was thoroughly irrigated with normal saline solution. The fascial edges were reapproximated using interrupted 2 0 Vicryl sutures. The subcutaneous tissues reapproximated using a running 3-0 Vicryl suture. The skin was closed with staples. Patient tolerated the procedure well and there were no complications. Instrument sponge and needle counts were correct at the end of the case. Patient was taken to recovery in stable condition.  Ruta Hinds, MD [facility] [place] Office: [phone] Pager: [phone]
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
## 2014-02-25 NOTE — Interval H&P Note (Signed)
History and Physical Interval Note:  02/25/2014 8:03 AM  Julian [name]  has presented today for surgery, with the diagnosis of /  The various methods of treatment have been discussed with the patient and family. After consideration of risks, benefits and other options for treatment, the patient has consented to  Procedure(s): AMPUTATION ABOVE KNEE (Left) as a surgical intervention .  The patient's history has been reviewed, patient examined, no change in status, stable for surgery.  I have reviewed the patient's chart and labs.  Questions were answered to the patient's satisfaction.     FIELDS,CHARLES E

## 2014-02-25 NOTE — Clinical Social Work Note (Signed)
Patient transferred from [facility] to [facility] 10/16. Patient had left AKA today. CSW will follow-up with patient regarding bed offers.  Johne Buckle Givens, MSW, LCSW [phone]

## 2014-02-25 NOTE — Progress Notes (Signed)
[name] PROGRESS NOTE  Julian [name] ZSW:[id number] DOB: 01/25/1946 DOA: 02/18/2014 PCP: Vic Blackbird, MD  Assessment and plan:  1. Sepsis with lactic acidosis. Secondary to left lower extremity cellulitis with gangrene. Blood cultures are negative. Stable. D/c abx now that s/p aka  Chronic left lower extremity vascular/neuropathic ulcers of the left lower extremity now with gangrene S/p AKA today. Pt ot  Hypotension, secondary to sepsis and hypovolemia. Labetalol held. On midodrine.    Anemia of chronic kidney disease/end-stage renal disease.  Type 2 diabetes mellitus with nephropathy and neuropathy. Continue SSI  ESRD: MWF schedule. Nephrology consulted  Code Status: Full code Family Communication:  Disposition Plan:   Consultants:  VVS  Nephrology  Procedures:  Hemodialysis  Antibiotics:  Vancomycin 10/12>>  Zosyn 10/12>>  Subjective C/o leg pain  Objective: Filed Vitals:   02/25/14 1621  BP: 103/61  Pulse: 83  Temp: 98 F (36.7 C)  Resp: 21   temperature 98.2. Pulse 82. Oxygen saturation 99% on room air.  Intake/Output Summary (Last 24 hours) at 02/25/14 1646 Last data filed at 02/25/14 1517  Gross per 24 hour  Intake   1070 ml  Output   1471 ml  Net   -401 ml   Filed Weights   02/24/14 2039 02/25/14 0750 02/25/14 1150  Weight: 109 kg (240 lb 4.8 oz) 110.5 kg (243 lb 9.7 oz) 108.7 kg (239 lb 10.2 oz)    Exam:   General:  Asleep. arousable  Cardiovascular: S1, S2, with a soft systolic murmur.  Respiratory: Clear anteriorly with decreased breath sounds in the bases. Breathing is nonlabored.  Abdomen: Obese, positive bowel sounds, nontender, nondistended. Musculoskeletal: aka dressing cdi  Data Reviewed: Basic Metabolic Panel:  Recent Labs Lab 02/19/14 0501 02/21/14 0522 02/22/14 0445 02/24/14 0535 02/25/14 0800  NA 137 138 137 134* 136*  K 4.6 5.4* 5.2 4.4 5.0  CL 95* 96 96 94* 93*  CO2 21 25 23 21 22    GLUCOSE 156* 109* 103* 137* 105*  BUN 45* 29* 37* 24* 30*  CREATININE 9.97* 7.15* 8.51* 7.26* 8.61*  CALCIUM 8.0* 8.5 8.6 8.6 8.1*  PHOS 7.6* 5.7*  --  5.5* 6.5*   Liver Function Tests:  Recent Labs Lab 02/21/14 0522 02/24/14 0535 02/25/14 0800  ALBUMIN 2.1* 2.0* 2.0*   No results found for this basename: LIPASE, AMYLASE,  in the last 168 hours No results found for this basename: AMMONIA,  in the last 168 hours CBC:  Recent Labs Lab 02/19/14 0501 02/20/14 0527 02/22/14 0445 02/24/14 0535 02/25/14 0800  WBC 8.1 7.9 12.4* 9.7 8.9  HGB 7.9* 8.3* 8.6* 8.4* 8.1*  HCT 25.0* 25.5* 27.0* 26.6* 25.5*  MCV 96.2 94.1 95.4 96.4 95.9  PLT 145* 150 182 140* 135*   Cardiac Enzymes: No results found for this basename: CKTOTAL, CKMB, CKMBINDEX, TROPONINI,  in the last 168 hours BNP (last 3 results) No results found for this basename: PROBNP,  in the last 8760 hours CBG:  Recent Labs Lab 02/24/14 1211 02/24/14 1743 02/24/14 2041 02/25/14 1229 02/25/14 1600  GLUCAP 151* 115* 111* 92 107*    Recent Results (from the past 240 hour(s))  CULTURE, BLOOD (ROUTINE X 2)     Status: None   Collection Time    02/18/14  1:22 AM      Result Value Ref Range Status   Specimen Description BLOOD LEFT ARM   Final   Special Requests BOTTLES DRAWN AEROBIC ONLY [place]   Final   Culture  NO GROWTH 5 DAYS   Final   Report Status 02/23/2014 FINAL   Final  CULTURE, BLOOD (ROUTINE X 2)     Status: None   Collection Time    02/18/14  1:36 AM      Result Value Ref Range Status   Specimen Description BLOOD LEFT HAND   Final   Special Requests BOTTLES DRAWN AEROBIC ONLY 6CC   Final   Culture NO GROWTH 5 DAYS   Final   Report Status 02/23/2014 FINAL   Final  MRSA PCR SCREENING     Status: None   Collection Time    02/18/14  4:40 AM      Result Value Ref Range Status   MRSA by PCR NEGATIVE  NEGATIVE Final   Comment:            The GeneXpert MRSA Assay (FDA     approved for NASAL specimens      only), is one component of a     comprehensive MRSA colonization     surveillance program. It is not     intended to diagnose MRSA     infection nor to guide or     monitor treatment for     MRSA infections.  SURGICAL PCR SCREEN     Status: None   Collection Time    02/24/14  8:53 AM      Result Value Ref Range Status   MRSA, PCR NEGATIVE  NEGATIVE Final   Staphylococcus aureus NEGATIVE  NEGATIVE Final   Comment:            The Xpert SA Assay (FDA     approved for NASAL specimens     in patients over 23 years of age),     is one component of     a comprehensive surveillance     program.  Test performance has     been validated by [employer] for patients greater     than or equal to 50 year old.     It is not intended     to diagnose infection nor to     guide or monitor treatment.     Studies: No results found.  Scheduled Meds: . aspirin EC  81 mg Oral Daily  . atorvastatin  80 mg Oral QPM  . cinacalcet  120 mg Oral Q lunch  . dicyclomine  10 mg Oral TID AC & HS  . [START ON 02/26/2014] docusate sodium  100 mg Oral Daily  . enoxaparin (LOVENOX) injection  30 mg Subcutaneous Q24H  . epoetin alfa  14,000 Units Intravenous Q M,W,F-HD  . insulin aspart  0-9 Units Subcutaneous TID WC  . midodrine  5 mg Oral TID WC  . multivitamin  1 tablet Oral QHS  . pantoprazole  40 mg Oral Daily  . piperacillin-tazobactam (ZOSYN)  IV  2.25 g Intravenous 3 times per day  . sevelamer carbonate  2,400 mg Oral TID WC  . sodium chloride  3 mL Intravenous Q12H  . vancomycin  1,000 mg Intravenous Q M,W,F-HD   Continuous Infusions:   Time spent: 25 minutes  Delfina Redwood, MD [name] Pager [phone]. If 7PM-7AM, please contact night-coverage at [email or web address], password [facility] 02/25/2014, 4:46 PM  LOS: 7 days

## 2014-02-25 NOTE — Procedures (Signed)
Tolerating hemodialysis without hemodynamic issues. Julian [name]

## 2014-02-25 NOTE — Anesthesia Procedure Notes (Signed)
Procedure Name: LMA Insertion Date/Time: 02/25/2014 2:07 PM Performed by: Ned Grace Pre-anesthesia Checklist: Patient identified, Emergency Drugs available, Suction available, Patient being monitored and Timeout performed Patient Re-evaluated:Patient Re-evaluated prior to inductionOxygen Delivery Method: Circle system utilized Preoxygenation: Pre-oxygenation with 100% oxygen Intubation Type: IV induction Ventilation: Mask ventilation without difficulty LMA: LMA inserted LMA Size: 5.0 Number of attempts: 1 Placement Confirmation: positive ETCO2 and breath sounds checked- equal and bilateral Tube secured with: Tape Dental Injury: Teeth and Oropharynx as per pre-operative assessment

## 2014-02-25 NOTE — Anesthesia Postprocedure Evaluation (Signed)
Anesthesia Post Note  Patient: Julian [name]  Procedure(s) Performed: Procedure(s) (LRB): AMPUTATION ABOVE KNEE (Left)  Anesthesia type: general  Patient location: PACU  Post pain: Pain level controlled  Post assessment: Patient's Cardiovascular Status Stable  Last Vitals:  Filed Vitals:   02/25/14 1551  BP: 101/58  Pulse: 85  Temp: 36.8 C  Resp: 15    Post vital signs: Reviewed and stable  Level of consciousness: sedated  Complications: No apparent anesthesia complications

## 2014-02-25 NOTE — Transfer of Care (Signed)
Immediate Anesthesia Transfer of Care Note  Patient: Julian [name]  Procedure(s) Performed: Procedure(s): AMPUTATION ABOVE KNEE (Left)  Patient Location: PACU  Anesthesia Type:General  Level of Consciousness: awake, oriented, patient cooperative and lethargic  Airway & Oxygen Therapy: Patient Spontanous Breathing and Patient connected to nasal cannula oxygen  Post-op Assessment: Report given to PACU RN and Post -op Vital signs reviewed and stable  Post vital signs: Reviewed and stable  Complications: No apparent anesthesia complications

## 2014-02-25 NOTE — Anesthesia Preprocedure Evaluation (Signed)
Anesthesia Evaluation    Reviewed: Allergy & Precautions, H&P , NPO status , Patient's Chart, lab work & pertinent test results  History of Anesthesia Complications Negative for: history of anesthetic complications  Airway       Dental   Pulmonary neg pulmonary ROS,          Cardiovascular hypertension, + CAD, + Past MI, + Cardiac Stents and + Peripheral Vascular Disease   EF 25-30% by cath, 40-45% by echo 09/18/13   Neuro/Psych negative neurological ROS  negative psych ROS   GI/Hepatic negative GI ROS, Neg liver ROS,   Endo/Other  diabetes  Renal/GU ESRF and DialysisRenal disease     Musculoskeletal   Abdominal   Peds  Hematology   Anesthesia Other Findings   Reproductive/Obstetrics                           Anesthesia Physical Anesthesia Plan  ASA: III  Anesthesia Plan: General   Post-op Pain Management:    Induction: Intravenous  Airway Management Planned: LMA  Additional Equipment:   Intra-op Plan:   Post-operative Plan: Extubation in OR  Informed Consent:   Plan Discussed with:   Anesthesia Plan Comments:         Anesthesia Quick Evaluation

## 2014-02-26 ENCOUNTER — Encounter: Payer: Self-pay | Admitting: Vascular Surgery

## 2014-02-26 ENCOUNTER — Encounter (HOSPITAL_COMMUNITY): Payer: Self-pay | Admitting: Vascular Surgery

## 2014-02-26 DIAGNOSIS — I10 Essential (primary) hypertension: Secondary | ICD-10-CM

## 2014-02-26 DIAGNOSIS — L02419 Cutaneous abscess of limb, unspecified: Secondary | ICD-10-CM

## 2014-02-26 DIAGNOSIS — L03119 Cellulitis of unspecified part of limb: Secondary | ICD-10-CM

## 2014-02-26 LAB — BASIC METABOLIC PANEL
Anion gap: 19 — ABNORMAL HIGH (ref 5–15)
BUN: 18 mg/dL (ref 6–23)
CO2: 23 mEq/L (ref 19–32)
Calcium: 8.5 mg/dL (ref 8.4–10.5)
Chloride: 95 mEq/L — ABNORMAL LOW (ref 96–112)
Creatinine, Ser: 5.51 mg/dL — ABNORMAL HIGH (ref 0.50–1.35)
GFR calc Af Amer: 11 mL/min — ABNORMAL LOW (ref >90)
GFR, EST NON AFRICAN AMERICAN: 10 mL/min — AB (ref >90)
GLUCOSE: 136 mg/dL — AB (ref 70–99)
Potassium: 4.5 mEq/L (ref 3.7–5.3)
Sodium: 137 mEq/L (ref 137–147)

## 2014-02-26 LAB — CBC
HCT: 26.9 % — ABNORMAL LOW (ref 39.0–52.0)
HEMOGLOBIN: 8.4 g/dL — AB (ref 13.0–17.0)
MCH: 30 pg (ref 26.0–34.0)
MCHC: 31.2 g/dL (ref 30.0–36.0)
MCV: 96.1 fL (ref 78.0–100.0)
PLATELETS: 135 10*3/uL — AB (ref 150–400)
RBC: 2.8 MIL/uL — ABNORMAL LOW (ref 4.22–5.81)
RDW: 17.6 % — ABNORMAL HIGH (ref 11.5–15.5)
WBC: 9 10*3/uL (ref 4.0–10.5)

## 2014-02-26 LAB — GLUCOSE, CAPILLARY
GLUCOSE-CAPILLARY: 189 mg/dL — AB (ref 70–99)
GLUCOSE-CAPILLARY: 163 mg/dL — AB (ref 70–99)
GLUCOSE-CAPILLARY: 188 mg/dL — AB (ref 70–99)
Glucose-Capillary: 165 mg/dL — ABNORMAL HIGH (ref 70–99)

## 2014-02-26 NOTE — Progress Notes (Signed)
[facility] [place]  Subjective  - Uneventful night, some pain in left AKA   Objective 112/60 73 98.1 F (36.7 C) (Oral) 18 98%  Intake/Output Summary (Last 24 hours) at 02/26/14 0753 Last data filed at 02/26/14 0611  Gross per 24 hour  Intake    710 ml  Output   1471 ml  Net   -761 ml   Left AKA dressing intact, no hematoma  Assessment/Planning: Change AKA dressing in a.m.  If incision looks good tomorrow should be ok for d/c from my standpoint.  Brynden Thune E 02/26/2014 7:53 AM --  Laboratory Lab Results:  Recent Labs  02/25/14 0800 02/26/14 0700  WBC 8.9 9.0  HGB 8.1* 8.4*  HCT 25.5* 26.9*  PLT 135* 135*   BMET  Recent Labs  02/24/14 0535 02/25/14 0800  NA 134* 136*  K 4.4 5.0  CL 94* 93*  CO2 21 22  GLUCOSE 137* 105*  BUN 24* 30*  CREATININE 7.26* 8.61*  CALCIUM 8.6 8.1*    COAG Lab Results  Component Value Date   INR 1.16 09/18/2013   INR 0.98 01/04/2012   INR 1.00 07/30/2011   No results found for this basename: PTT
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
## 2014-02-26 NOTE — Consult Note (Signed)
  [facility] Progress Note    Subjective:   Denies pain. Feeling tired.   Objective:   BP 112/60  Pulse 73  Temp(Src) 98.1 F (36.7 C) (Oral)  Resp 18  Ht 5\' 10"  (1.778 m)  Wt 239 lb 10.2 oz (108.699 kg)  BMI 34.38 kg/m2  SpO2 98%  Intake/Output: I/O last 3 completed shifts: In: 710 [P.O.:360; I.V.:100; IV Piggyback:250] Out: 1471 [Other:1471]   Intake/Output this shift:    Weight change: 3 lb 4.9 oz (1.5 kg)  Physical Exam: Gen: NAD, older gentleman lying in bed, somewhat drowsy CVS:RRR, no MRG Resp:CTAB AVW:UJWJ, NTND, +BS Ext: left leg stump dressing CDI  Labs: BMET  Recent Labs Lab 02/21/14 0522 02/22/14 0445 02/24/14 0535 02/25/14 0800  NA 138 137 134* 136*  K 5.4* 5.2 4.4 5.0  CL 96 96 94* 93*  CO2 25 23 21 22   GLUCOSE 109* 103* 137* 105*  BUN 29* 37* 24* 30*  CREATININE 7.15* 8.51* 7.26* 8.61*  ALBUMIN 2.1*  --  2.0* 2.0*  CALCIUM 8.5 8.6 8.6 8.1*  PHOS 5.7*  --  5.5* 6.5*   CBC  Recent Labs Lab 02/22/14 0445 02/24/14 0535 02/25/14 0800 02/26/14 0700  WBC 12.4* 9.7 8.9 9.0  HGB 8.6* 8.4* 8.1* 8.4*  HCT 27.0* 26.6* 25.5* 26.9*  MCV 95.4 96.4 95.9 96.1  PLT 182 140* 135* 135*    Medications:    . aspirin EC  81 mg Oral Daily  . atorvastatin  80 mg Oral QPM  . cinacalcet  120 mg Oral Q lunch  . dicyclomine  10 mg Oral TID AC & HS  . docusate sodium  100 mg Oral Daily  . enoxaparin (LOVENOX) injection  30 mg Subcutaneous Q24H  . epoetin alfa  14,000 Units Intravenous Q M,W,F-HD  . insulin aspart  0-9 Units Subcutaneous TID WC  . midodrine  5 mg Oral TID WC  . multivitamin  1 tablet Oral QHS  . pantoprazole  40 mg Oral Daily  . sevelamer carbonate  2,400 mg Oral TID WC  . sodium chloride  3 mL Intravenous Q12H     Assessment/ Plan:   1. Sepsis/left LE gangrene - failed conservative mgt, now afebrile on Vancomycin & Zosyn since 10/12, POD1 from [facility] per Dr. Oneida Alar. 2. ESRD - HD on MWF @ [facility] in [place], K  4.4. HD tomorrow.  3. Hypotension/volume - BP 99/23, on Midodrine 5 mg tid; wt 109.3 kg, no signs or symptoms of fluid overload. 4. Anemia - Hgb 8.4, Epogen 14,000 U per HD. 5. Metabolic bone disease - Ca 8.6 (10.2 corrected), P 5.5; Sensipar 120 mcg qd, Patient refusing renvela 6. Severe protein calorie malnutrition - Alb 2, renal diet, multivitamin. 7. DM Type 2 - insulin per primary. 8. Hx PVD - s/p R AKA 9. CAD - s/p PCI with DES to distal LCx 09/2013.  Beverlyn Roux 02/26/2014, 7:59 AM   Renal Attending: He looks well post amputation.  Will plan for HD in AM. Nickalous Stingley C

## 2014-02-26 NOTE — Evaluation (Signed)
Physical Therapy Evaluation Patient Details Name: Julian [name] MRN: [id number] DOB: 1945-05-14 Today's Date: 02/26/2014   History of Present Illness  Julian [name] is a 68 y.o. 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   Clinical Impression  Patient demonstrates significant deficits in functional mobility as indicated below. Will need continued skilled PT to address deficits and maximize function. Wills see as indicated and progress as tolerated. Will need SNF upon acute discharge.    Follow Up Recommendations SNF    Equipment Recommendations  Other (comment) (TBD)    Recommendations for Other Services       Precautions / Restrictions Precautions Precautions: Fall (Left AKA, hx of R AKA)      Mobility  Bed Mobility Overal bed mobility: Needs Assistance;+2 for physical assistance Bed Mobility: Rolling;Supine to Sit Rolling: Mod assist;+2 for physical assistance   Supine to sit:  (attempted but unable to perform secondary to pain)        Transfers                    Ambulation/Gait                Stairs            Wheelchair Mobility    Modified Rankin (Stroke Patients Only)       Balance Overall balance assessment: Needs assistance (unable to tolerate EOB balance)                                           Pertinent Vitals/Pain Pain Assessment: 0-10 Pain Score: 8  Pain Location: L LLE Pain Descriptors / Indicators: Constant Pain Intervention(s): Limited activity within patient's tolerance;Monitored during session;Repositioned    Home Living Family/patient expects to be discharged to:: Private residence Living Arrangements: Alone   Type of Home: Apartment Home Access: Level entry     Home Layout: One level Home  Equipment: Wheelchair - power      Prior Function           Comments: patient has electric wheelchair and aide 7 days a week     Hand Dominance   Dominant Hand: Right    Extremity/Trunk Assessment   Upper Extremity Assessment: Generalized weakness           Lower Extremity Assessment: RLE deficits/detail;LLE deficits/detail RLE Deficits / Details: R AKA LLE Deficits / Details: New L AKA     Communication      Cognition Arousal/Alertness: Awake/alert Behavior During Therapy: WFL for tasks assessed/performed Overall Cognitive Status: No family/caregiver present to determine baseline cognitive functioning                      General Comments General comments (skin integrity, edema, etc.): Educated on positioning and elevation for edema control.     Exercises        Assessment/Plan    PT Assessment Patient needs continued PT services  PT Diagnosis Acute pain   PT Problem List Decreased strength;Decreased range of motion;Decreased activity tolerance;Decreased balance;Decreased mobility;Impaired sensation;Obesity;Pain  PT Treatment Interventions DME instruction;Functional mobility training;Therapeutic activities;Therapeutic exercise;Balance training;Patient/family education   PT Goals (Current goals can be found in the Care Plan section) Acute Rehab PT Goals PT Goal Formulation:  With patient Time For Goal Achievement: 03/12/14 Potential to Achieve Goals: Fair    Frequency Min 2X/week   Barriers to discharge        Co-evaluation               End of Session   Activity Tolerance: Patient limited by pain Patient left: in bed;with call bell/phone within reach Nurse Communication: Mobility status;Need for lift equipment         Time: 0981-1914 PT Time Calculation (min): 16 min   Charges:   PT Evaluation $Initial PT Evaluation Tier I: 1 Procedure PT Treatments $Therapeutic Activity: 8-22 mins   PT G CodesDuncan Dull 02/26/2014, 4:22 PM Alben Deeds, [place] DPT  [phone]

## 2014-02-26 NOTE — Progress Notes (Signed)
[name] PROGRESS NOTE  Julian [name] QPY:[id number] DOB: 01/08/1946 DOA: 02/18/2014 PCP: Vic Blackbird, MD  Assessment and plan:  Sepsis with lactic acidosis. Secondary to left lower extremity cellulitis with gangrene. Blood cultures are negative. Stable. D/c abx now that s/p aka  Chronic left lower extremity vascular/neuropathic ulcers of the left lower extremity now with gangrene S/p AKA today. Pt ot  Hypotension, secondary to sepsis and hypovolemia. Labetalol held. On midodrine.    Anemia of chronic kidney disease/end-stage renal disease.  Type 2 diabetes mellitus with nephropathy and neuropathy. Continue SSI  ESRD: MWF schedule. Nephrology consulted  PT/OT consult- patient has electric wheelchair and aide 7 days a week  Code Status: Full code Family Communication:  Disposition Plan:   Consultants:  VVS  Nephrology  Procedures:  Hemodialysis  Antibiotics:  Vancomycin 10/12>>  Zosyn 10/12>>  Subjective No SOB, no CP  Objective: Filed Vitals:   02/26/14 0921  BP: 112/50  Pulse: 64  Temp: 98.2 F (36.8 C)  Resp: 18    Intake/Output Summary (Last 24 hours) at 02/26/14 1057 Last data filed at 02/26/14 0900  Gross per 24 hour  Intake    950 ml  Output   1471 ml  Net   -521 ml   Filed Weights   02/25/14 0750 02/25/14 1150 02/25/14 2058  Weight: 110.5 kg (243 lb 9.7 oz) 108.7 kg (239 lb 10.2 oz) 108.699 kg (239 lb 10.2 oz)    Exam:   General:  Speech difficult to understand  Cardiovascular: S1, S2, with a soft systolic murmur.  Respiratory: Clear anteriorly with decreased breath sounds in the bases. Breathing is nonlabored.  Abdomen: Obese, positive bowel sounds, nontender, nondistended. Musculoskeletal: aka dressing cdi  Data Reviewed: Basic Metabolic Panel:  Recent Labs Lab 02/21/14 0522 02/22/14 0445 02/24/14 0535 02/25/14 0800 02/26/14 0700  NA 138 137 134* 136* 137  K 5.4* 5.2 4.4 5.0 4.5  CL 96 96 94*  93* 95*  CO2 25 23 21 22 23   GLUCOSE 109* 103* 137* 105* 136*  BUN 29* 37* 24* 30* 18  CREATININE 7.15* 8.51* 7.26* 8.61* 5.51*  CALCIUM 8.5 8.6 8.6 8.1* 8.5  PHOS 5.7*  --  5.5* 6.5*  --    Liver Function Tests:  Recent Labs Lab 02/21/14 0522 02/24/14 0535 02/25/14 0800  ALBUMIN 2.1* 2.0* 2.0*   No results found for this basename: LIPASE, AMYLASE,  in the last 168 hours No results found for this basename: AMMONIA,  in the last 168 hours CBC:  Recent Labs Lab 02/20/14 0527 02/22/14 0445 02/24/14 0535 02/25/14 0800 02/26/14 0700  WBC 7.9 12.4* 9.7 8.9 9.0  HGB 8.3* 8.6* 8.4* 8.1* 8.4*  HCT 25.5* 27.0* 26.6* 25.5* 26.9*  MCV 94.1 95.4 96.4 95.9 96.1  PLT 150 182 140* 135* 135*   Cardiac Enzymes: No results found for this basename: CKTOTAL, CKMB, CKMBINDEX, TROPONINI,  in the last 168 hours BNP (last 3 results) No results found for this basename: PROBNP,  in the last 8760 hours CBG:  Recent Labs Lab 02/25/14 1229 02/25/14 1600 02/25/14 1651 02/25/14 2101 02/26/14 0750  GLUCAP 92 107* 96 138* 189*    Recent Results (from the past 240 hour(s))  CULTURE, BLOOD (ROUTINE X 2)     Status: None   Collection Time    02/18/14  1:22 AM      Result Value Ref Range Status   Specimen Description BLOOD LEFT ARM   Final   Special Requests BOTTLES DRAWN AEROBIC  ONLY 6CC   Final   Culture NO GROWTH 5 DAYS   Final   Report Status 02/23/2014 FINAL   Final  CULTURE, BLOOD (ROUTINE X 2)     Status: None   Collection Time    02/18/14  1:36 AM      Result Value Ref Range Status   Specimen Description BLOOD LEFT HAND   Final   Special Requests BOTTLES DRAWN AEROBIC ONLY 6CC   Final   Culture NO GROWTH 5 DAYS   Final   Report Status 02/23/2014 FINAL   Final  MRSA PCR SCREENING     Status: None   Collection Time    02/18/14  4:40 AM      Result Value Ref Range Status   MRSA by PCR NEGATIVE  NEGATIVE Final   Comment:            The GeneXpert MRSA Assay (FDA     approved  for NASAL specimens     only), is one component of a     comprehensive MRSA colonization     surveillance program. It is not     intended to diagnose MRSA     infection nor to guide or     monitor treatment for     MRSA infections.  SURGICAL PCR SCREEN     Status: None   Collection Time    02/24/14  8:53 AM      Result Value Ref Range Status   MRSA, PCR NEGATIVE  NEGATIVE Final   Staphylococcus aureus NEGATIVE  NEGATIVE Final   Comment:            The Xpert SA Assay (FDA     approved for NASAL specimens     in patients over 31 years of age),     is one component of     a comprehensive surveillance     program.  Test performance has     been validated by [employer] for patients greater     than or equal to 23 year old.     It is not intended     to diagnose infection nor to     guide or monitor treatment.     Studies: No results found.  Scheduled Meds: . aspirin EC  81 mg Oral Daily  . atorvastatin  80 mg Oral QPM  . cinacalcet  120 mg Oral Q lunch  . dicyclomine  10 mg Oral TID AC & HS  . docusate sodium  100 mg Oral Daily  . enoxaparin (LOVENOX) injection  30 mg Subcutaneous Q24H  . epoetin alfa  14,000 Units Intravenous Q M,W,F-HD  . insulin aspart  0-9 Units Subcutaneous TID WC  . midodrine  5 mg Oral TID WC  . multivitamin  1 tablet Oral QHS  . pantoprazole  40 mg Oral Daily  . sevelamer carbonate  2,400 mg Oral TID WC  . sodium chloride  3 mL Intravenous Q12H   Continuous Infusions:   Time spent: 25 minutes  Julian Prows, DO [name] Pager [phone]. If 7PM-7AM, please contact night-coverage at [email or web address], password [facility] 02/26/2014, 10:57 AM  LOS: 8 days

## 2014-02-27 ENCOUNTER — Telehealth: Payer: Self-pay

## 2014-02-27 ENCOUNTER — Ambulatory Visit: Payer: PRIVATE HEALTH INSURANCE | Admitting: Vascular Surgery

## 2014-02-27 DIAGNOSIS — Z89612 Acquired absence of left leg above knee: Secondary | ICD-10-CM

## 2014-02-27 DIAGNOSIS — I959 Hypotension, unspecified: Secondary | ICD-10-CM

## 2014-02-27 LAB — CBC
HEMATOCRIT: 26.2 % — AB (ref 39.0–52.0)
Hemoglobin: 8.3 g/dL — ABNORMAL LOW (ref 13.0–17.0)
MCH: 31 pg (ref 26.0–34.0)
MCHC: 31.7 g/dL (ref 30.0–36.0)
MCV: 97.8 fL (ref 78.0–100.0)
Platelets: 135 10*3/uL — ABNORMAL LOW (ref 150–400)
RBC: 2.68 MIL/uL — ABNORMAL LOW (ref 4.22–5.81)
RDW: 17.9 % — AB (ref 11.5–15.5)
WBC: 8.4 10*3/uL (ref 4.0–10.5)

## 2014-02-27 LAB — RENAL FUNCTION PANEL
ANION GAP: 17 — AB (ref 5–15)
Albumin: 2 g/dL — ABNORMAL LOW (ref 3.5–5.2)
BUN: 25 mg/dL — ABNORMAL HIGH (ref 6–23)
CHLORIDE: 95 meq/L — AB (ref 96–112)
CO2: 24 mEq/L (ref 19–32)
Calcium: 8.1 mg/dL — ABNORMAL LOW (ref 8.4–10.5)
Creatinine, Ser: 7.2 mg/dL — ABNORMAL HIGH (ref 0.50–1.35)
GFR calc non Af Amer: 7 mL/min — ABNORMAL LOW (ref >90)
GFR, EST AFRICAN AMERICAN: 8 mL/min — AB (ref >90)
Glucose, Bld: 136 mg/dL — ABNORMAL HIGH (ref 70–99)
POTASSIUM: 4.1 meq/L (ref 3.7–5.3)
Phosphorus: 5.4 mg/dL — ABNORMAL HIGH (ref 2.3–4.6)
SODIUM: 136 meq/L — AB (ref 137–147)

## 2014-02-27 LAB — GLUCOSE, CAPILLARY
GLUCOSE-CAPILLARY: 112 mg/dL — AB (ref 70–99)
Glucose-Capillary: 139 mg/dL — ABNORMAL HIGH (ref 70–99)

## 2014-02-27 MED ORDER — SODIUM CHLORIDE 0.9 % IV SOLN: 100.0000 mL | INTRAVENOUS | Status: DC | PRN

## 2014-02-27 MED ORDER — SODIUM CHLORIDE 0.9 % IV SOLN
100.0000 mL | INTRAVENOUS | Status: DC | PRN
Start: 2014-02-27 — End: 2014-02-27

## 2014-02-27 MED ORDER — HEPARIN SODIUM (PORCINE) 1000 UNIT/ML DIALYSIS: 1000.0000 [IU] | INTRAMUSCULAR | Status: DC | PRN

## 2014-02-27 MED ORDER — PENTAFLUOROPROP-TETRAFLUOROETH EX AERO
1.0000 | INHALATION_SPRAY | CUTANEOUS | Status: DC | PRN
Start: 2014-02-27 — End: 2014-02-27

## 2014-02-27 MED ORDER — ALTEPLASE 2 MG IJ SOLR: 2.0000 mg | Freq: Once | INTRAMUSCULAR | Status: DC | PRN

## 2014-02-27 MED ORDER — NEPRO/CARBSTEADY PO LIQD: 237.0000 mL | ORAL | Status: DC | PRN

## 2014-02-27 MED ORDER — HEPARIN SODIUM (PORCINE) 1000 UNIT/ML DIALYSIS: 20.0000 [IU]/kg | INTRAMUSCULAR | Status: DC | PRN

## 2014-02-27 MED ORDER — MIDODRINE HCL 5 MG PO TABS
ORAL_TABLET | ORAL | Status: AC
  Filled 2014-02-27: qty 1

## 2014-02-27 MED ORDER — LIDOCAINE HCL (PF) 1 % IJ SOLN: 5.0000 mL | INTRAMUSCULAR | Status: DC | PRN

## 2014-02-27 MED ORDER — LIDOCAINE-PRILOCAINE 2.5-2.5 % EX CREA
1.0000 | TOPICAL_CREAM | CUTANEOUS | Status: DC | PRN
Start: 2014-02-27 — End: 2014-02-27

## 2014-02-27 MED ORDER — TICAGRELOR 90 MG PO TABS
90.0000 mg | ORAL_TABLET | Freq: Two times a day (BID) | ORAL | Status: DC
  Administered 2014-02-27 – 2014-02-28 (×2): 90 mg via ORAL
  Filled 2014-02-27 (×6): qty 1

## 2014-02-27 NOTE — Procedures (Signed)
Tolerating hemodialysis.  We will need to establish a new EDW post op amputation. Julian [name]

## 2014-02-27 NOTE — Progress Notes (Signed)
Rehab admissions - Evaluated for possible admission.  Please see rehab consult recommending SNF.  Patient is sedentary and lacks social supports to benefit from acute inpatient rehab stay.  Agree with need for SNF.

## 2014-02-27 NOTE — Progress Notes (Signed)
OT Cancellation Note  Patient Details Name: Julian [name] MRN: [id number] DOB: 1945-05-27   Cancelled Treatment:    Reason Eval/Treat Not Completed: Patient not medically ready (at HD)  Parke Poisson B 02/27/2014, 7:50 AM Pager: [phone]

## 2014-02-27 NOTE — Progress Notes (Signed)
[facility] Progress Note Patient Details:  Julian [name] [birth year] [id number] Called order in to [facility]. Patient ID: [name], male   DOB: Jan 20, 1946, 68 y.o.   MRN: [id number]   Darrol Poke 02/27/2014, 9:45 AM

## 2014-02-27 NOTE — Progress Notes (Signed)
[facility] [place]  Subjective  - less pain   Objective 111/60 86 98.4 F (36.9 C) (Oral) 18 98%  Intake/Output Summary (Last 24 hours) at 02/27/14 0915 Last data filed at 02/27/14 0500  Gross per 24 hour  Intake    600 ml  Output      0 ml  Net    600 ml   Left AKA healing well no drainage incision intact  Assessment/Planning: POD #2 AKA Pain controlled ready for d/c from our stdpt. Needs follow up in 1 month Dry dressing for AKA  Julian [name],Julian [name] 02/27/2014 9:15 AM --  Laboratory Lab Results:  Recent Labs  02/26/14 0700 02/27/14 0724  WBC 9.0 8.4  HGB 8.4* 8.3*  HCT 26.9* 26.2*  PLT 135* 135*   BMET  Recent Labs  02/26/14 0700 02/27/14 0724  NA 137 136*  K 4.5 4.1  CL 95* 95*  CO2 23 24  GLUCOSE 136* 136*  BUN 18 25*  CREATININE 5.51* 7.20*  CALCIUM 8.5 8.1*    COAG Lab Results  Component Value Date   INR 1.16 09/18/2013   INR 0.98 01/04/2012   INR 1.00 07/30/2011   No results found for this basename: PTT

## 2014-02-27 NOTE — Progress Notes (Signed)
[name] PROGRESS NOTE  Julian [name] PIR:[id number] DOB: 27-Nov-1945 DOA: 02/18/2014 PCP: Vic Blackbird, MD  Assessment/Plan: #1 sepsis with lactic acidosis Secondary to left lower extremity cellulitis and gangrene. Status post left AKA. Blood cultures pending with no growth to date. Patient was treated on IV antibiotics for 7 day course with IV vancomycin and Zosyn which were discontinued after surgery. Follow.  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 Patient being followed by vascular surgery. Will need outpatient followup.  #3 hypotension Secondary to sepsis and hypovolemia. Patient currently on Midrin.  #4 anemia of chronic disease/end-stage renal disease Per renal. Patient has been receiving hemodialysis throughout his hospitalization.  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  #6 end-stage renal disease on hemodialysis Per renal.  Code Status: Full Family Communication: Updated patient. No family present. Disposition Plan: SNF when medically stable.   Consultants:  Renal: Dr. Marval Regal 02/24/2014  Vascular surgery: Dr. Trula Slade 02/24/2014  Procedures:  Left AKA 02/25/2014 per Dr. Oneida Alar  Antibiotics:  IV vancomycin 02/18/2014 >>>> 02/25/2014  IV Zosyn 02/18/2014 >>> 02/25/2014  HPI/Subjective: Patient states still with some pain in the leg however is feeling better. In hemodialysis.  Objective: Filed Vitals:   02/27/14 1038  BP: 100/48  Pulse: 70  Temp: 97.9 F (36.6 C)  Resp: 15    Intake/Output Summary (Last 24 hours) at 02/27/14 1058 Last data filed at 02/27/14 1038  Gross per 24 hour  Intake    600 ml  Output   2000 ml  Net  -1400 ml   Filed Weights   02/26/14 2237 02/27/14 0656 02/27/14 1038  Weight: 108.699 kg (239 lb 10.2 oz) 104.3 kg (229 lb 15 oz) 102.1 kg (225 lb 1.4 oz)    Exam:   General:   NAD  Cardiovascular: RRR  Respiratory: CTAB  Abdomen: Soft, nontender, nondistended, positive bowel sounds.  Musculoskeletal: No clubbing or cyanosis. Status post left AKA. Incision site clean dry and intact. Some tenderness to palpation. Staples intact.  Data Reviewed: Basic Metabolic Panel:  Recent Labs Lab 02/21/14 0522 02/22/14 0445 02/24/14 0535 02/25/14 0800 02/26/14 0700 02/27/14 0724  NA 138 137 134* 136* 137 136*  K 5.4* 5.2 4.4 5.0 4.5 4.1  CL 96 96 94* 93* 95* 95*  CO2 25 23 21 22 23 24   GLUCOSE 109* 103* 137* 105* 136* 136*  BUN 29* 37* 24* 30* 18 25*  CREATININE 7.15* 8.51* 7.26* 8.61* 5.51* 7.20*  CALCIUM 8.5 8.6 8.6 8.1* 8.5 8.1*  PHOS 5.7*  --  5.5* 6.5*  --  5.4*   Liver Function Tests:  Recent Labs Lab 02/21/14 0522 02/24/14 0535 02/25/14 0800 02/27/14 0724  ALBUMIN 2.1* 2.0* 2.0* 2.0*   No results found for this basename: LIPASE, AMYLASE,  in the last 168 hours No results found for this basename: AMMONIA,  in the last 168 hours CBC:  Recent Labs Lab 02/22/14 0445 02/24/14 0535 02/25/14 0800 02/26/14 0700 02/27/14 0724  WBC 12.4* 9.7 8.9 9.0 8.4  HGB 8.6* 8.4* 8.1* 8.4* 8.3*  HCT 27.0* 26.6* 25.5* 26.9* 26.2*  MCV 95.4 96.4 95.9 96.1 97.8  PLT 182 140* 135* 135* 135*   Cardiac Enzymes: No results found for this basename: CKTOTAL, CKMB, CKMBINDEX, TROPONINI,  in the last 168 hours BNP (last 3 results) No results found for this basename: PROBNP,  in the last 8760 hours CBG:  Recent Labs Lab 02/25/14 2101  02/26/14 0750 02/26/14 1150 02/26/14 1653 02/26/14 2236  GLUCAP 138* 189* 165* 163* 188*    Recent Results (from the past 240 hour(s))  CULTURE, BLOOD (ROUTINE X 2)     Status: None   Collection Time    02/18/14  1:22 AM      Result Value Ref Range Status   Specimen Description BLOOD LEFT ARM   Final   Special Requests BOTTLES DRAWN AEROBIC ONLY 6CC   Final   Culture NO GROWTH 5 DAYS   Final   Report Status 02/23/2014  FINAL   Final  CULTURE, BLOOD (ROUTINE X 2)     Status: None   Collection Time    02/18/14  1:36 AM      Result Value Ref Range Status   Specimen Description BLOOD LEFT HAND   Final   Special Requests BOTTLES DRAWN AEROBIC ONLY 6CC   Final   Culture NO GROWTH 5 DAYS   Final   Report Status 02/23/2014 FINAL   Final  MRSA PCR SCREENING     Status: None   Collection Time    02/18/14  4:40 AM      Result Value Ref Range Status   MRSA by PCR NEGATIVE  NEGATIVE Final   Comment:            The GeneXpert MRSA Assay (FDA     approved for NASAL specimens     only), is one component of a     comprehensive MRSA colonization     surveillance program. It is not     intended to diagnose MRSA     infection nor to guide or     monitor treatment for     MRSA infections.  SURGICAL PCR SCREEN     Status: None   Collection Time    02/24/14  8:53 AM      Result Value Ref Range Status   MRSA, PCR NEGATIVE  NEGATIVE Final   Staphylococcus aureus NEGATIVE  NEGATIVE Final   Comment:            The Xpert SA Assay (FDA     approved for NASAL specimens     in patients over 67 years of age),     is one component of     a comprehensive surveillance     program.  Test performance has     been validated by [employer] for patients greater     than or equal to 67 year old.     It is not intended     to diagnose infection nor to     guide or monitor treatment.     Studies: No results found.  Scheduled Meds: . aspirin EC  81 mg Oral Daily  . atorvastatin  80 mg Oral QPM  . cinacalcet  120 mg Oral Q lunch  . dicyclomine  10 mg Oral TID AC & HS  . docusate sodium  100 mg Oral Daily  . enoxaparin (LOVENOX) injection  30 mg Subcutaneous Q24H  . epoetin alfa  14,000 Units Intravenous Q M,W,F-HD  . insulin aspart  0-9 Units Subcutaneous TID WC  . midodrine  5 mg Oral TID WC  . multivitamin  1 tablet Oral QHS  . pantoprazole  40 mg Oral Daily  . sevelamer carbonate  2,400 mg Oral TID WC  .  sodium chloride  3 mL Intravenous Q12H   Continuous Infusions:   Principal Problem:   Sepsis  Active Problems:   End-stage renal disease on hemodialysis   Foot ulcer, left   CAD (coronary artery disease)   Cellulitis of left lower extremity   Hypotension   Diabetes mellitus type 2 with complications   Anemia of renal disease   Lactic acidosis   PVD (peripheral vascular disease)   Gangrene of lower extremity    Time spent: 35 mins    [facility] MD [name] Pager [phone]. If 7PM-7AM, please contact night-coverage at [email or web address], password [facility] 02/27/2014, 10:58 AM  LOS: 9 days

## 2014-02-27 NOTE — Consult Note (Signed)
  [facility] Progress Note    Subjective:   Feeling well, no new complaints   Objective:   BP 99/64  Pulse 85  Temp(Src) 98.4 F (36.9 C) (Oral)  Resp 18  Ht 5\' 10"  (1.778 m)  Wt 229 lb 15 oz (104.3 kg)  BMI 32.99 kg/m2  SpO2 98%  Intake/Output: I/O last 3 completed shifts: In: 1200 [P.O.:1200] Out: -    Intake/Output this shift:    Weight change: -3 lb 15.5 oz (-1.801 kg)  Physical Exam: Gen: NAD, older gentleman lying in bed, somewhat drowsy CVS:RRR, no MRG Resp:CTAB QJJ:HERD, NTND, +BS Ext: left leg stump dressing CDI  Labs: BMET  Recent Labs Lab 02/21/14 0522 02/22/14 0445 02/24/14 0535 02/25/14 0800 02/26/14 0700 02/27/14 0724  NA 138 137 134* 136* 137 136*  K 5.4* 5.2 4.4 5.0 4.5 4.1  CL 96 96 94* 93* 95* 95*  CO2 25 23 21 22 23 24   GLUCOSE 109* 103* 137* 105* 136* 136*  BUN 29* 37* 24* 30* 18 25*  CREATININE 7.15* 8.51* 7.26* 8.61* 5.51* 7.20*  ALBUMIN 2.1*  --  2.0* 2.0*  --  2.0*  CALCIUM 8.5 8.6 8.6 8.1* 8.5 8.1*  PHOS 5.7*  --  5.5* 6.5*  --  5.4*   CBC  Recent Labs Lab 02/24/14 0535 02/25/14 0800 02/26/14 0700 02/27/14 0724  WBC 9.7 8.9 9.0 8.4  HGB 8.4* 8.1* 8.4* 8.3*  HCT 26.6* 25.5* 26.9* 26.2*  MCV 96.4 95.9 96.1 97.8  PLT 140* 135* 135* 135*    Medications:    . aspirin EC  81 mg Oral Daily  . atorvastatin  80 mg Oral QPM  . cinacalcet  120 mg Oral Q lunch  . dicyclomine  10 mg Oral TID AC & HS  . docusate sodium  100 mg Oral Daily  . enoxaparin (LOVENOX) injection  30 mg Subcutaneous Q24H  . epoetin alfa  14,000 Units Intravenous Q M,W,F-HD  . insulin aspart  0-9 Units Subcutaneous TID WC  . midodrine      . midodrine  5 mg Oral TID WC  . multivitamin  1 tablet Oral QHS  . pantoprazole  40 mg Oral Daily  . sevelamer carbonate  2,400 mg Oral TID WC  . sodium chloride  3 mL Intravenous Q12H     Assessment/ Plan:   1. Sepsis/left LE gangrene - POD2 from L AKA per Dr. Oneida Alar. Ready for discharge  when SNF can be set up 2. ESRD - HD on MWF @ [facility] in [place], K 4.4. HD today  3. Hypotension/volume - BP 99/23, on Midodrine 5 mg tid; wt 109.3 kg, no signs or symptoms of fluid overload. 4. Anemia - Hgb 8.4, Epogen 14,000 U per HD. 5. Metabolic bone disease - Ca 8.6 (10.2 corrected), P 5.5; Sensipar 120 mcg qd, Patient refusing renvela 6. Severe protein calorie malnutrition - Alb 2, renal diet, multivitamin. 7. DM Type 2 - insulin per primary. 8. Hx PVD - s/p B AKA 9. CAD - s/p PCI with DES to distal LCx 09/2013.  Julian [name] 02/27/2014, 8:31 AM

## 2014-02-28 ENCOUNTER — Ambulatory Visit: Payer: PRIVATE HEALTH INSURANCE | Admitting: Vascular Surgery

## 2014-02-28 ENCOUNTER — Telehealth: Payer: Self-pay | Admitting: Vascular Surgery

## 2014-02-28 LAB — RENAL FUNCTION PANEL
ALBUMIN: 1.9 g/dL — AB (ref 3.5–5.2)
ANION GAP: 14 (ref 5–15)
BUN: 18 mg/dL (ref 6–23)
CHLORIDE: 98 meq/L (ref 96–112)
CO2: 25 mEq/L (ref 19–32)
CREATININE: 5.22 mg/dL — AB (ref 0.50–1.35)
Calcium: 7.8 mg/dL — ABNORMAL LOW (ref 8.4–10.5)
GFR, EST AFRICAN AMERICAN: 12 mL/min — AB (ref >90)
GFR, EST NON AFRICAN AMERICAN: 10 mL/min — AB (ref >90)
Glucose, Bld: 153 mg/dL — ABNORMAL HIGH (ref 70–99)
POTASSIUM: 4.4 meq/L (ref 3.7–5.3)
Phosphorus: 3.7 mg/dL (ref 2.3–4.6)
Sodium: 137 mEq/L (ref 137–147)

## 2014-02-28 LAB — CBC
HCT: 29.4 % — ABNORMAL LOW (ref 39.0–52.0)
Hemoglobin: 9.1 g/dL — ABNORMAL LOW (ref 13.0–17.0)
MCH: 31 pg (ref 26.0–34.0)
MCHC: 31 g/dL (ref 30.0–36.0)
MCV: 100 fL (ref 78.0–100.0)
PLATELETS: 137 10*3/uL — AB (ref 150–400)
RBC: 2.94 MIL/uL — AB (ref 4.22–5.81)
RDW: 18.4 % — AB (ref 11.5–15.5)
WBC: 8.3 10*3/uL (ref 4.0–10.5)

## 2014-02-28 LAB — GLUCOSE, CAPILLARY
GLUCOSE-CAPILLARY: 199 mg/dL — AB (ref 70–99)
GLUCOSE-CAPILLARY: 147 mg/dL — AB (ref 70–99)
Glucose-Capillary: 160 mg/dL — ABNORMAL HIGH (ref 70–99)

## 2014-02-28 MED ORDER — DOCUSATE SODIUM 283 MG RE ENEM: 1.0000 | ENEMA | RECTAL | Status: DC | PRN

## 2014-02-28 MED ORDER — CAMPHOR-MENTHOL 0.5-0.5 % EX LOTN
1.0000 "application " | TOPICAL_LOTION | Freq: Three times a day (TID) | CUTANEOUS | Status: DC | PRN
  Filled 2014-02-28: qty 222

## 2014-02-28 MED ORDER — CALCIUM CARBONATE 1250 MG/5ML PO SUSP: 500.0000 mg | Freq: Four times a day (QID) | ORAL | Status: DC | PRN

## 2014-02-28 MED ORDER — ZOLPIDEM TARTRATE 5 MG PO TABS: 5.0000 mg | ORAL_TABLET | Freq: Every evening | ORAL | Status: DC | PRN

## 2014-02-28 MED ORDER — SORBITOL 70 % SOLN
30.0000 mL | Status: DC | PRN
  Filled 2014-02-28: qty 30

## 2014-02-28 MED ORDER — HYDROXYZINE HCL 25 MG PO TABS: 25.0000 mg | ORAL_TABLET | Freq: Three times a day (TID) | ORAL | Status: DC | PRN

## 2014-02-28 NOTE — Progress Notes (Signed)
Medicare Important Message given? YES  (If response is "NO", the following Medicare IM given date fields will be blank)  Date Medicare IM given:  02/28/2014 Medicare IM given by: Jonnie Finner

## 2014-02-28 NOTE — Telephone Encounter (Signed)
Message copied by Gena Fray on Thu Feb 28, 2014  3:18 PM ------      Message from: Peter Minium K      Created: Thu Feb 28, 2014  9:01 AM      Regarding: Schedule                   ----- Message -----         From: Gabriel Earing, PA-C         Sent: 02/28/2014   8:25 AM           To: [facility] Charge Pool            S/p left AKA 02/25/14.  F/u with Dr. Oneida Alar 4 weeks.            Thanks,      Aldona Bar ------

## 2014-02-28 NOTE — Progress Notes (Signed)
1. Sepsis/left LE gangrene - s/p L AKA per Dr. Oneida Alar. 2. ESRD - HD on MWF @ [facility] in [place], K 4.4. HD Friday if still in hospital.  Will need to establish a new lower EDW s/p amputation.   Subjective: Interval History: Pain is well controlled.  He runs a chronic low BP  Objective: Vital signs in last 24 hours: Temp:  [97.7 F (36.5 C)-98.4 F (36.9 C)] 98.4 F (36.9 C) (10/22 0935) Pulse Rate:  [80-87] 84 (10/22 0935) Resp:  [16-18] 18 (10/22 0935) BP: (81-110)/(48-56) 95/48 mmHg (10/22 0935) SpO2:  [100 %] 100 % (10/22 0935) Weight change: -6.599 kg (-14 lb 8.8 oz)  Intake/Output from previous day: 10/21 0701 - 10/22 0700 In: -  Out: 2000  Intake/Output this shift: Total I/O In: 240 [P.O.:240] Out: -   General appearance: alert and cooperative Resp: clear to auscultation bilaterally Cardio: regular rate and rhythm, S1, S2 normal, no murmur, click, rub or gallop Bila amputee  Lab Results:  Recent Labs  02/27/14 0724 02/28/14 0703  WBC 8.4 8.3  HGB 8.3* 9.1*  HCT 26.2* 29.4*  PLT 135* 137*   BMET:  Recent Labs  02/27/14 0724 02/28/14 0703  NA 136* 137  K 4.1 4.4  CL 95* 98  CO2 24 25  GLUCOSE 136* 153*  BUN 25* 18  CREATININE 7.20* 5.22*  CALCIUM 8.1* 7.8*   No results found for this basename: PTH,  in the last 72 hours Iron Studies: No results found for this basename: IRON, TIBC, TRANSFERRIN, FERRITIN,  in the last 72 hours Studies/Results: No results found.  Scheduled: . aspirin EC  81 mg Oral Daily  . atorvastatin  80 mg Oral QPM  . cinacalcet  120 mg Oral Q lunch  . dicyclomine  10 mg Oral TID AC & HS  . docusate sodium  100 mg Oral Daily  . enoxaparin (LOVENOX) injection  30 mg Subcutaneous Q24H  . epoetin alfa  14,000 Units Intravenous Q M,W,F-HD  . insulin aspart  0-9 Units Subcutaneous TID WC  . midodrine  5 mg Oral TID WC  . multivitamin  1 tablet Oral QHS  . pantoprazole  40 mg Oral Daily  . sevelamer carbonate  2,400 mg Oral  TID WC  . sodium chloride  3 mL Intravenous Q12H  . ticagrelor  90 mg Oral BID     LOS: 10 days   Sabena Winner C 02/28/2014,11:57 AM

## 2014-02-28 NOTE — Progress Notes (Signed)
Occupational Therapy Evaluation Patient Details Name: Julian [name] MRN: [id number] DOB: Mar 22, 1946 Today's Date: 02/28/2014    History of Present Illness Julian [name] is a 68 y.o. 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    Clinical Impression   Patient presents to OT with decreased ADL and mobility skills due to the problems listed below. Patient will benefit from skilled OT to maximize independence and safety with ADLs.     Follow Up Recommendations  SNF;Supervision/Assistance - 24 hour    Equipment Recommendations  Other (comment) (tbd at SNF)    Recommendations for Other Services       Precautions / Restrictions Precautions Precautions: Fall Restrictions Weight Bearing Restrictions: Yes      Mobility Bed Mobility                  Transfers                      Balance                                            ADL Overall ADL's : Needs assistance/impaired Eating/Feeding: Set up;Bed level   Grooming: Wash/dry hands;Wash/dry face;Bed level   Upper Body Bathing: Set up;Bed level   Lower Body Bathing: Maximal assistance;Bed level   Upper Body Dressing : Minimal assistance;Bed level   Lower Body Dressing: Total assistance;Bed level                 General ADL Comments: Patient motivated, participatory as able. Able to use urinal independently during evaluation.      Vision                     Perception     Praxis      Pertinent Vitals/Pain Pain Assessment: No/denies pain     Hand Dominance Right   Extremity/Trunk Assessment Upper Extremity Assessment Upper Extremity Assessment: Overall WFL for tasks assessed   Lower Extremity Assessment Lower Extremity Assessment: Defer to PT evaluation        Communication Communication Communication: No difficulties   Cognition Arousal/Alertness: Awake/alert Behavior During Therapy: WFL for tasks assessed/performed Overall Cognitive Status: Within Functional Limits for tasks assessed                     General Comments       Exercises       Shoulder Instructions      Home Living Family/patient expects to be discharged to:: Skilled nursing facility Living Arrangements: Alone   Type of Home: Apartment Home Access: Level entry     Home Layout: One level               Home Equipment: Wheelchair - power;Wheelchair - manual   Additional Comments: has aide 7 days/week, 6-8 hours/day      Prior Functioning/Environment Level of Independence: Needs assistance    ADL's / Homemaking Assistance Needed: aide assists with bathing, dressing, toileting, meals        OT Diagnosis: Generalized weakness   OT Problem List: Decreased strength;Decreased activity tolerance;Impaired balance (sitting and/or standing);Decreased knowledge of use of DME or AE;Decreased knowledge of precautions   OT Treatment/Interventions: Self-care/ADL training;Therapeutic exercise;DME  and/or AE instruction;Therapeutic activities;Patient/family education    OT Goals(Current goals can be found in the care plan section) Acute Rehab OT Goals Patient Stated Goal: agrees to rehab prior to home OT Goal Formulation: With patient Time For Goal Achievement: 03/14/14 Potential to Achieve Goals: Fair  OT Frequency: Min 2X/week   Barriers to D/C: Decreased caregiver support          Co-evaluation              End of Session Nurse Communication: Other (comment) (CNA reported pt requires more than 1 person for mobility)  Activity Tolerance: Patient tolerated treatment well Patient left: in bed;with call bell/phone within reach   Time: 2542-7062 OT Time Calculation (min): 29 min Charges:  OT General Charges $OT Visit: 1 Procedure OT  Evaluation $Initial OT Evaluation Tier I: 1 Procedure OT Treatments $Self Care/Home Management : 23-37 mins G-Codes:    Kyair Ditommaso A 2014-03-23, 11:15 AM

## 2014-02-28 NOTE — Progress Notes (Signed)
[name] PROGRESS NOTE  Julian [name] NUU:[id number] DOB: Sep 22, 1945 DOA: 02/18/2014 PCP: Vic Blackbird, MD  Assessment/Plan: #1 sepsis with lactic acidosis Secondary to left lower extremity cellulitis and gangrene. Status post left AKA. Blood cultures pending with no growth to date. Patient was treated on IV antibiotics for 7 day course with IV vancomycin and Zosyn which were discontinued after surgery. Follow.  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 Patient being followed by vascular surgery. Will need outpatient followup.  #3 Chronic hypotension Secondary to sepsis and hypovolemia and chronically. Patient currently on Midrin.  #4 anemia of chronic disease/end-stage renal disease Per renal. Patient has been receiving hemodialysis throughout his hospitalization.  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  #6 end-stage renal disease on hemodialysis Per renal.  Code Status: Full Family Communication: Updated patient. No family present. Disposition Plan: SNF when medically stable.   Consultants:  Renal: Dr. Marval Regal 02/24/2014  Vascular surgery: Dr. Trula Slade 02/24/2014  Procedures:  Left AKA 02/25/2014 per Dr. Oneida Alar  Antibiotics:  IV vancomycin 02/18/2014 >>>> 02/25/2014  IV Zosyn 02/18/2014 >>> 02/25/2014  HPI/Subjective: Patient states still with some pain in the leg however is feeling better. Patient complaining of constipation. Patient however refusing medications.  Objective: Filed Vitals:   02/28/14 0935  BP: 95/48  Pulse: 84  Temp: 98.4 F (36.9 C)  Resp: 18    Intake/Output Summary (Last 24 hours) at 02/28/14 1218 Last data filed at 02/28/14 0900  Gross per 24 hour  Intake    240 ml  Output      0 ml  Net    240 ml   Filed Weights   02/26/14 2237 02/27/14 0656 02/27/14 1038  Weight: 108.699 kg (239 lb 10.2 oz) 104.3  kg (229 lb 15 oz) 102.1 kg (225 lb 1.4 oz)    Exam:   General:  NAD  Cardiovascular: RRR  Respiratory: CTAB  Abdomen: Soft, nontender, nondistended, positive bowel sounds.  Musculoskeletal: No clubbing or cyanosis. Status post left AKA. Incision site clean dry and intact. Some tenderness to palpation. Staples intact.  Data Reviewed: Basic Metabolic Panel:  Recent Labs Lab 02/24/14 0535 02/25/14 0800 02/26/14 0700 02/27/14 0724 02/28/14 0703  NA 134* 136* 137 136* 137  K 4.4 5.0 4.5 4.1 4.4  CL 94* 93* 95* 95* 98  CO2 21 22 23 24 25   GLUCOSE 137* 105* 136* 136* 153*  BUN 24* 30* 18 25* 18  CREATININE 7.26* 8.61* 5.51* 7.20* 5.22*  CALCIUM 8.6 8.1* 8.5 8.1* 7.8*  PHOS 5.5* 6.5*  --  5.4* 3.7   Liver Function Tests:  Recent Labs Lab 02/24/14 0535 02/25/14 0800 02/27/14 0724 02/28/14 0703  ALBUMIN 2.0* 2.0* 2.0* 1.9*   No results found for this basename: LIPASE, AMYLASE,  in the last 168 hours No results found for this basename: AMMONIA,  in the last 168 hours CBC:  Recent Labs Lab 02/24/14 0535 02/25/14 0800 02/26/14 0700 02/27/14 0724 02/28/14 0703  WBC 9.7 8.9 9.0 8.4 8.3  HGB 8.4* 8.1* 8.4* 8.3* 9.1*  HCT 26.6* 25.5* 26.9* 26.2* 29.4*  MCV 96.4 95.9 96.1 97.8 100.0  PLT 140* 135* 135* 135* 137*   Cardiac Enzymes: No results found for this basename: CKTOTAL, CKMB, CKMBINDEX, TROPONINI,  in the last 168 hours BNP (last 3 results) No results found for this basename: PROBNP,  in the last 8760 hours CBG:  Recent Labs Lab 02/26/14  2236 02/27/14 1114 02/27/14 1648 02/28/14 0748 02/28/14 1139  GLUCAP 188* 112* 139* 160* 199*    Recent Results (from the past 240 hour(s))  SURGICAL PCR SCREEN     Status: None   Collection Time    02/24/14  8:53 AM      Result Value Ref Range Status   MRSA, PCR NEGATIVE  NEGATIVE Final   Staphylococcus aureus NEGATIVE  NEGATIVE Final   Comment:            The Xpert SA Assay (FDA     approved for NASAL  specimens     in patients over 17 years of age),     is one component of     a comprehensive surveillance     program.  Test performance has     been validated by [employer] for patients greater     than or equal to 30 year old.     It is not intended     to diagnose infection nor to     guide or monitor treatment.     Studies: No results found.  Scheduled Meds: . aspirin EC  81 mg Oral Daily  . atorvastatin  80 mg Oral QPM  . cinacalcet  120 mg Oral Q lunch  . dicyclomine  10 mg Oral TID AC & HS  . docusate sodium  100 mg Oral Daily  . enoxaparin (LOVENOX) injection  30 mg Subcutaneous Q24H  . epoetin alfa  14,000 Units Intravenous Q M,W,F-HD  . insulin aspart  0-9 Units Subcutaneous TID WC  . midodrine  5 mg Oral TID WC  . multivitamin  1 tablet Oral QHS  . pantoprazole  40 mg Oral Daily  . sevelamer carbonate  2,400 mg Oral TID WC  . sodium chloride  3 mL Intravenous Q12H  . ticagrelor  90 mg Oral BID   Continuous Infusions:   Principal Problem:   Sepsis Active Problems:   End-stage renal disease on hemodialysis   Foot ulcer, left   CAD (coronary artery disease)   Cellulitis of left lower extremity   Hypotension   Diabetes mellitus type 2 with complications   Anemia of renal disease   Lactic acidosis   PVD (peripheral vascular disease)   Gangrene of lower extremity    Time spent: 35 mins    [facility] MD [name] Pager [phone]. If 7PM-7AM, please contact night-coverage at [email or web address], password [facility] 02/28/2014, 12:18 PM  LOS: 10 days

## 2014-02-28 NOTE — Progress Notes (Signed)
  Progress Note    02/28/2014 8:57 AM 3 Days Post-Op  Subjective:  States he didn't have any pain overnight.  A little sore when he tries to move it  Afebrile VSS   Filed Vitals:   02/28/14 0523  BP: 110/52  Pulse: 87  Temp: 98.2 F (36.8 C)  Resp: 18    Physical Exam: Incisions:  Retention sock is in place and is clean and dry.   CBC    Component Value Date/Time   WBC 8.3 02/28/2014 0703   RBC 2.94* 02/28/2014 0703   HGB 9.1* 02/28/2014 0703   HCT 29.4* 02/28/2014 0703   PLT 137* 02/28/2014 0703   MCV 100.0 02/28/2014 0703   MCH 31.0 02/28/2014 0703   MCHC 31.0 02/28/2014 0703   RDW 18.4* 02/28/2014 0703   LYMPHSABS 1.3 02/18/2014 0119   MONOABS 0.9 02/18/2014 0119   EOSABS 0.1 02/18/2014 0119   BASOSABS 0.0 02/18/2014 0119    BMET    Component Value Date/Time   NA 137 02/28/2014 0703   K 4.4 02/28/2014 0703   CL 98 02/28/2014 0703   CO2 25 02/28/2014 0703   GLUCOSE 153* 02/28/2014 0703   BUN 18 02/28/2014 0703   CREATININE 5.22* 02/28/2014 0703   CREATININE 7.23* 09/04/2013 0905   CALCIUM 7.8* 02/28/2014 0703   GFRNONAA 10* 02/28/2014 0703   GFRAA 12* 02/28/2014 0703    INR    Component Value Date/Time   INR 1.16 09/18/2013 0610     Intake/Output Summary (Last 24 hours) at 02/28/14 0857 Last data filed at 02/27/14 1038  Gross per 24 hour  Intake      0 ml  Output   2000 ml  Net  -2000 ml     Assessment/Plan:  68 y.o. 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 -pain is well controlled. -stump inspected yesterday, so I did not take down dressing today as pt is eating breakfast.  -he is ready for d/c from vascular surgery standpoint.  -if he is here tomorrow, we will check his stump. -f/u with Dr. Oneida Alar in 4 weeks.  Our office will call the pt to arrange an appt.   Leontine Locket, PA-C [facility] [phone] 02/28/2014 8:57 AM

## 2014-02-28 NOTE — Progress Notes (Signed)
Pt has been refusing majority of medications throughout the day, despite numerous attempts by RN and education. MD made aware. Will continue to monitor.

## 2014-02-28 NOTE — Progress Notes (Signed)
[facility]  Patient Status: Active (receiving services up to time of hospitalization)  [facility] is providing the following services: RN  If patient discharges after hours, please call [phone].   Consepcion Hearing 02/28/2014, 2:49 PM

## 2014-03-01 LAB — CBC
HEMATOCRIT: 27 % — AB (ref 39.0–52.0)
Hemoglobin: 8.5 g/dL — ABNORMAL LOW (ref 13.0–17.0)
MCH: 30.4 pg (ref 26.0–34.0)
MCHC: 31.5 g/dL (ref 30.0–36.0)
MCV: 96.4 fL (ref 78.0–100.0)
Platelets: 137 10*3/uL — ABNORMAL LOW (ref 150–400)
RBC: 2.8 MIL/uL — ABNORMAL LOW (ref 4.22–5.81)
RDW: 18.4 % — ABNORMAL HIGH (ref 11.5–15.5)
WBC: 7.3 10*3/uL (ref 4.0–10.5)

## 2014-03-01 LAB — TYPE AND SCREEN
ABO/RH(D): O POS
ANTIBODY SCREEN: NEGATIVE
Unit division: 0
Unit division: 0

## 2014-03-01 LAB — GLUCOSE, CAPILLARY
GLUCOSE-CAPILLARY: 131 mg/dL — AB (ref 70–99)
Glucose-Capillary: 135 mg/dL — ABNORMAL HIGH (ref 70–99)

## 2014-03-01 LAB — RENAL FUNCTION PANEL
Albumin: 1.9 g/dL — ABNORMAL LOW (ref 3.5–5.2)
Anion gap: 14 (ref 5–15)
BUN: 26 mg/dL — AB (ref 6–23)
CHLORIDE: 97 meq/L (ref 96–112)
CO2: 25 meq/L (ref 19–32)
Calcium: 7.3 mg/dL — ABNORMAL LOW (ref 8.4–10.5)
Creatinine, Ser: 6.83 mg/dL — ABNORMAL HIGH (ref 0.50–1.35)
GFR calc Af Amer: 9 mL/min — ABNORMAL LOW (ref >90)
GFR calc non Af Amer: 7 mL/min — ABNORMAL LOW (ref >90)
Glucose, Bld: 146 mg/dL — ABNORMAL HIGH (ref 70–99)
Phosphorus: 3.6 mg/dL (ref 2.3–4.6)
Potassium: 4 mEq/L (ref 3.7–5.3)
Sodium: 136 mEq/L — ABNORMAL LOW (ref 137–147)

## 2014-03-01 MED ORDER — PENTAFLUOROPROP-TETRAFLUOROETH EX AERO: 1.0000 "application " | INHALATION_SPRAY | CUTANEOUS | Status: DC | PRN

## 2014-03-01 MED ORDER — HEPARIN SODIUM (PORCINE) 1000 UNIT/ML DIALYSIS
20.0000 [IU]/kg | INTRAMUSCULAR | Status: DC | PRN
  Administered 2014-03-01: 2000 [IU] via INTRAVENOUS_CENTRAL

## 2014-03-01 MED ORDER — SODIUM CHLORIDE 0.9 % IV SOLN: 100.0000 mL | INTRAVENOUS | Status: DC | PRN

## 2014-03-01 MED ORDER — OXYCODONE-ACETAMINOPHEN 10-325 MG PO TABS: 1.0000 | ORAL_TABLET | Freq: Four times a day (QID) | ORAL | Status: DC | PRN

## 2014-03-01 MED ORDER — ALBUMIN HUMAN 25 % IV SOLN
INTRAVENOUS | Status: AC
  Administered 2014-03-01: 25 g via INTRAVENOUS
  Filled 2014-03-01: qty 100

## 2014-03-01 MED ORDER — ALBUMIN HUMAN 25 % IV SOLN
25.0000 g | Freq: Once | INTRAVENOUS | Status: AC
  Administered 2014-03-01: 25 g via INTRAVENOUS
  Filled 2014-03-01 (×2): qty 100

## 2014-03-01 MED ORDER — LIDOCAINE-PRILOCAINE 2.5-2.5 % EX CREA: 1.0000 "application " | TOPICAL_CREAM | CUTANEOUS | Status: DC | PRN

## 2014-03-01 MED ORDER — DSS 100 MG PO CAPS: 100.0000 mg | ORAL_CAPSULE | Freq: Every day | ORAL | Status: DC

## 2014-03-01 MED ORDER — NEPRO/CARBSTEADY PO LIQD: 237.0000 mL | ORAL | Status: DC | PRN

## 2014-03-01 MED ORDER — LIDOCAINE HCL (PF) 1 % IJ SOLN: 5.0000 mL | INTRAMUSCULAR | Status: DC | PRN

## 2014-03-01 MED ORDER — MIDODRINE HCL 5 MG PO TABS: 5.0000 mg | ORAL_TABLET | Freq: Three times a day (TID) | ORAL | Status: DC

## 2014-03-01 MED ORDER — HEPARIN SODIUM (PORCINE) 1000 UNIT/ML DIALYSIS: 1000.0000 [IU] | INTRAMUSCULAR | Status: DC | PRN

## 2014-03-01 MED ORDER — ALTEPLASE 2 MG IJ SOLR
2.0000 mg | Freq: Once | INTRAMUSCULAR | Status: DC | PRN
  Filled 2014-03-01: qty 2

## 2014-03-01 NOTE — Progress Notes (Signed)
PT Cancellation Note  Patient Details Name: MARTINEZ BOXX MRN: [id number] DOB: 06-May-1946   Cancelled Treatment:    Reason Eval/Treat Not Completed: Patient at procedure or test/unavailable. Will check back as time allows this afternoon.    Rolinda Roan 03/01/2014, 12:26 PM  Rolinda Roan, PT, DPT [facility] Pager: [phone]

## 2014-03-01 NOTE — Progress Notes (Signed)
Pt continues to refuse night time medications. Pt educated on medications. Per pt will not take any medications that he feels he does not need. Pt resting in bed. PRN pain and anti-diarrhea medications administered per order. Will continue to monitor.

## 2014-03-01 NOTE — Progress Notes (Signed)
Called report to [place] to [place].

## 2014-03-01 NOTE — Progress Notes (Signed)
  Subjective: Interval History: Pain is well controlled.  No complaints this am. Wants to go home  Objective: Vital signs in last 24 hours: Temp:  [97.9 F (36.6 C)-99.1 F (37.3 C)] 97.9 F (36.6 C) (10/23 0500) Pulse Rate:  [79-87] 79 (10/23 0753) Resp:  [18] 18 (10/23 0500) BP: (86-108)/(44-54) 87/52 mmHg (10/23 0753) SpO2:  [97 %-100 %] 97 % (10/23 0500) Weight:  [232 lb 9.4 oz (105.5 kg)] 232 lb 9.4 oz (105.5 kg) (10/22 2100) Weight change: 7 lb 7.9 oz (3.4 kg)  Intake/Output from previous day: 10/22 0701 - 10/23 0700 In: 840 [P.O.:840] Out: -  Intake/Output this shift:    General appearance: alert and cooperative Resp: clear to auscultation bilaterally Cardio: regular rate and rhythm, S1, S2 normal, no murmur, click, rub or gallop Bila amputee  Lab Results:  Recent Labs  02/27/14 0724 02/28/14 0703  WBC 8.4 8.3  HGB 8.3* 9.1*  HCT 26.2* 29.4*  PLT 135* 137*   BMET:   Recent Labs  02/27/14 0724 02/28/14 0703  NA 136* 137  K 4.1 4.4  CL 95* 98  CO2 24 25  GLUCOSE 136* 153*  BUN 25* 18  CREATININE 7.20* 5.22*  CALCIUM 8.1* 7.8*   No results found for this basename: PTH,  in the last 72 hours Iron Studies: No results found for this basename: IRON, TIBC, TRANSFERRIN, FERRITIN,  in the last 72 hours Studies/Results: No results found.  Scheduled: . aspirin EC  81 mg Oral Daily  . atorvastatin  80 mg Oral QPM  . cinacalcet  120 mg Oral Q lunch  . dicyclomine  10 mg Oral TID AC & HS  . docusate sodium  100 mg Oral Daily  . enoxaparin (LOVENOX) injection  30 mg Subcutaneous Q24H  . epoetin alfa  14,000 Units Intravenous Q M,W,F-HD  . insulin aspart  0-9 Units Subcutaneous TID WC  . midodrine  5 mg Oral TID WC  . multivitamin  1 tablet Oral QHS  . pantoprazole  40 mg Oral Daily  . sevelamer carbonate  2,400 mg Oral TID WC  . sodium chloride  3 mL Intravenous Q12H  . ticagrelor  90 mg Oral BID   Assessment and Plan: 1. 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 2. ESRD - HD on MWF @ [facility] in [place], K 4.4. HD today then resume outpatient Monday  Will need to establish a new lower EDW s/p amputation.     LOS: 11 days   Beverlyn Roux 03/01/2014,8:07 AM  Renal Attending; He feels well.  He looks a little heavy.  We will dialyze today and try to optimize volume before discharge. Lydiah Pong C
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
## 2014-03-01 NOTE — Progress Notes (Signed)
  [facility] Progress Note  03/01/2014 10:19 AM  S/p left AKA. 4 Days Post-Op  Patient seen in HD. Having minimal pain in left stump and able to mobilize some. Stump is healing well with sutures intact. Stable for d/c from vascular standpoint. F/u with Dr. Oneida Alar in 4 weeks with staple removal.   Virgina Jock, PA-C [facility] Office: [phone] Pager: [phone] 03/01/2014 10:19 AM

## 2014-03-04 ENCOUNTER — Telehealth: Payer: Self-pay | Admitting: Vascular Surgery

## 2014-03-04 NOTE — Telephone Encounter (Signed)
notified [name] at  [phone] of post op appt. with dr. Oneida Alar on 03-28-14 at 11:45

## 2014-03-12 ENCOUNTER — Telehealth: Payer: Self-pay | Admitting: Family Medicine

## 2014-03-12 MED ORDER — OXYCODONE-ACETAMINOPHEN 10-325 MG PO TABS: 1.0000 | ORAL_TABLET | Freq: Two times a day (BID) | ORAL | Status: DC | PRN

## 2014-03-12 NOTE — Telephone Encounter (Signed)
Okay to refill should say 1 po BID prn pain #60

## 2014-03-12 NOTE — Telephone Encounter (Signed)
Ok to refill??  Last office visit 01/22/2014.  Last refill 01/27/2014.  Was given #15 from ER on 03/01/2014.

## 2014-03-12 NOTE — Telephone Encounter (Signed)
Requesting rx on percocet  [phone]

## 2014-03-12 NOTE — Telephone Encounter (Signed)
Prescription printed and patient made aware to come to office to pick up per VM.  

## 2014-03-22 ENCOUNTER — Telehealth: Payer: Self-pay

## 2014-03-22 ENCOUNTER — Ambulatory Visit (INDEPENDENT_AMBULATORY_CARE_PROVIDER_SITE_OTHER): Payer: PRIVATE HEALTH INSURANCE | Admitting: Family

## 2014-03-22 ENCOUNTER — Encounter: Payer: Self-pay | Admitting: Family

## 2014-03-22 DIAGNOSIS — R609 Edema, unspecified: Secondary | ICD-10-CM

## 2014-03-22 NOTE — Patient Instructions (Signed)
Peripheral Vascular Disease Peripheral Vascular Disease (PVD), also called Peripheral Arterial Disease (PAD), is a circulation problem caused by cholesterol (atherosclerotic plaque) deposits in the arteries. PVD commonly occurs in the lower extremities (legs) but it can occur in other areas of the body, such as your arms. The cholesterol buildup in the arteries reduces blood flow which can cause pain and other serious problems. The presence of PVD can place a person at risk for Coronary Artery Disease (CAD).  CAUSES  Causes of PVD can be many. It is usually associated with more than one risk factor such as:   High Cholesterol.  Smoking.  Diabetes.  Lack of exercise or inactivity.  High blood pressure (hypertension).  Obesity.  Family history. SYMPTOMS   When the lower extremities are affected, patients with PVD may experience:  Leg pain with exertion or physical activity. This is called INTERMITTENT CLAUDICATION. This may present as cramping or numbness with physical activity. The location of the pain is associated with the level of blockage. For example, blockage at the abdominal level (distal abdominal aorta) may result in buttock or hip pain. Lower leg arterial blockage may result in calf pain.  As PVD becomes more severe, pain can develop with less physical activity.  In people with severe PVD, leg pain may occur at rest.  Other PVD signs and symptoms:  Leg numbness or weakness.  Coldness in the affected leg or foot, especially when compared to the other leg.  A change in leg color.  Patients with significant PVD are more prone to ulcers or sores on toes, feet or legs. These may take longer to heal or may reoccur. The ulcers or sores can become infected.  If signs and symptoms of PVD are ignored, gangrene may occur. This can result in the loss of toes or loss of an entire limb.  Not all leg pain is related to PVD. Other medical conditions can cause leg pain such  as:  Blood clots (embolism) or Deep Vein Thrombosis.  Inflammation of the blood vessels (vasculitis).  Spinal stenosis. DIAGNOSIS  Diagnosis of PVD can involve several different types of tests. These can include:  Pulse Volume Recording Method (PVR). This test is simple, painless and does not involve the use of X-rays. PVR involves measuring and comparing the blood pressure in the arms and legs. An ABI (Ankle-Brachial Index) is calculated. The normal ratio of blood pressures is 1. As this number becomes smaller, it indicates more severe disease.  < 0.95 - indicates significant narrowing in one or more leg vessels.  <0.8 - there will usually be pain in the foot, leg or buttock with exercise.  <0.4 - will usually have pain in the legs at rest.  <0.25 - usually indicates limb threatening PVD.  Doppler detection of pulses in the legs. This test is painless and checks to see if you have a pulses in your legs/feet.  A dye or contrast material (a substance that highlights the blood vessels so they show up on x-ray) may be given to help your caregiver better see the arteries for the following tests. The dye is eliminated from your body by the kidney's. Your caregiver may order blood work to check your kidney function and other laboratory values before the following tests are performed:  Magnetic Resonance Angiography (MRA). An MRA is a picture study of the blood vessels and arteries. The MRA machine uses a large magnet to produce images of the blood vessels.  Computed Tomography Angiography (CTA). A CTA   is a specialized x-ray that looks at how the blood flows in your blood vessels. An IV may be inserted into your arm so contrast dye can be injected.  Angiogram. Is a procedure that uses x-rays to look at your blood vessels. This procedure is minimally invasive, meaning a small incision (cut) is made in your groin. A small tube (catheter) is then inserted into the artery of your groin. The catheter  is guided to the blood vessel or artery your caregiver wants to examine. Contrast dye is injected into the catheter. X-rays are then taken of the blood vessel or artery. After the images are obtained, the catheter is taken out. TREATMENT  Treatment of PVD involves many interventions which may include:  Lifestyle changes:  Quitting smoking.  Exercise.  Following a low fat, low cholesterol diet.  Control of diabetes.  Foot care is very important to the PVD patient. Good foot care can help prevent infection.  Medication:  Cholesterol-lowering medicine.  Blood pressure medicine.  Anti-platelet drugs.  Certain medicines may reduce symptoms of Intermittent Claudication.  Interventional/Surgical options:  Angioplasty. An Angioplasty is a procedure that inflates a balloon in the blocked artery. This opens the blocked artery to improve blood flow.  Stent Implant. A wire mesh tube (stent) is placed in the artery. The stent expands and stays in place, allowing the artery to remain open.  Peripheral Bypass Surgery. This is a surgical procedure that reroutes the blood around a blocked artery to help improve blood flow. This type of procedure may be performed if Angioplasty or stent implants are not an option. SEEK IMMEDIATE MEDICAL CARE IF:   You develop pain or numbness in your arms or legs.  Your arm or leg turns cold, becomes blue in color.  You develop redness, warmth, swelling and pain in your arms or legs. MAKE SURE YOU:   Understand these instructions.  Will watch your condition.  Will get help right away if you are not doing well or get worse. Document Released: 06/03/2004 Document Revised: 07/19/2011 Document Reviewed: 04/30/2008 ExitCare Patient Information 2015 [employer]. This information is not intended to replace advice given to you by your health care provider. Make sure you discuss any questions you have with your health care provider.   Stasis Ulcer Stasis  ulcers occur in the legs when the circulation is damaged. An ulcer may look like a small hole in the skin.  CAUSES Stasis ulcers occur because your veins do not work properly. Veins have valves that help the blood return to the heart. If these valves do not work right, blood flows backwards and backs up into the veins near the skin. This condition causes the veins to become larger because of increased pressure and may lead to a stasis ulcer. SYMPTOMS   Shallow (superficial) sore on the leg.  Clear drainage or weeping from the sore.  Leg pain or a feeling of heaviness. This may be worse at the end of the day.  Leg swelling.  Skin color changes. DIAGNOSIS  Your caregiver will make a diagnosis by examining your leg. Your caregiver may order tests such as an ultrasound or other studies to evaluate the blood flow of the leg. HOME CARE INSTRUCTIONS   Do not stand or sit in one position for long periods of time. Do not sit with your legs crossed. Rest with your legs raised during the day. If possible, it is best if you can elevate your legs above your heart for 30 minutes, 3   to 4 times a day.  Wear elastic stockings or support hose. Do not wear other tight encircling garments around legs, pelvis, or waist. This causes increased pressure in your veins. If your caregiver has applied compressive medicated wraps, use them as instructed.  Walk as much as possible to increase blood flow. If you are taking long rides in a car or plane, take a break to walk around every 2 hours. If not already on aspirin, take a baby aspirin before long trips unless you have medical reasons that prohibit this.  Raise the foot of your bed at night with 2-inch blocks if approved by your caregiver. This may not be desirable if you have heart failure or breathing problems.  If you get a cut in the skin over the vein and the vein bleeds, lie down with your leg raised and gently clean the area with a clean cloth. Apply pressure  on the cut until the bleeding stops. Then place a dressing on the cut. See your caregiver if it continues to bleed or needs stitches. Also, see your caregiver if you develop an infection.Signs of an infection include a fever, redness, increased pain, and drainage of pus.  If your caregiver has given you a follow-up appointment, it is very important to keep that appointment. Not keeping the appointment could result in a chronic or permanent injury, pain, and disability. If there is any problem keeping the appointment, call your caregiver for assistance. SEEK IMMEDIATE MEDICAL CARE IF:  The ulcer area starts to break down.  You have pain, redness, tenderness, pus, or hard swelling in your leg over a vein or near the ulcer.  Your leg pain is uncomfortable.  You develop an unexplained fever.  You develop chest pain or shortness of breath. Document Released: 01/19/2001 Document Revised: 07/19/2011 Document Reviewed: 08/16/2010 ExitCare Patient Information 2015 [employer]. This information is not intended to replace advice given to you by your health care provider. Make sure you discuss any questions you have with your health care provider.     
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
## 2014-03-22 NOTE — Telephone Encounter (Signed)
Phone call from nurse at nursing facility.  Reported the Nurse Practitioner evaluated pt. And noted increased edema of the left thigh, and redness along the AKA incisional area.  Is requesting that pt. be evaluated.  Nurse denied that pt. Has any fevers documented.  Denied any incisional drainage.  Appt. given for 2:40 PM today, for incision check.  Agrees with plan.

## 2014-03-23 ENCOUNTER — Telehealth: Payer: Self-pay | Admitting: Family Medicine

## 2014-03-23 MED ORDER — GLIPIZIDE 5 MG PO TABS
5.0000 mg | ORAL_TABLET | Freq: Every day | ORAL | Status: DC
Start: 2014-03-23 — End: 2014-05-24

## 2014-03-23 NOTE — Telephone Encounter (Signed)
Please call pt, i received his labs his blood sugars are too high and this may impact healing of his recent ampuation I want him to take Glipizide 5mg  once a day with breakfast Check his blood sugars fasting Send in new test strips and meter if needed, he has been off meds for about 2 years now

## 2014-03-25 MED ORDER — BLOOD GLUCOSE METER KIT: PACK | Status: DC

## 2014-03-25 NOTE — Addendum Note (Signed)
Addended by: Sheral Flow on: 03/25/2014 10:26 AM   Modules accepted: Orders

## 2014-03-25 NOTE — Telephone Encounter (Signed)
Call placed to patient.   Reports that he is currently residing at [facility] for rehab x2 weeks.   Advised that he will pick up prescription and begin new orders when he returns to home.   MD made aware.

## 2014-03-27 ENCOUNTER — Encounter: Payer: Self-pay | Admitting: Vascular Surgery

## 2014-03-28 ENCOUNTER — Ambulatory Visit (INDEPENDENT_AMBULATORY_CARE_PROVIDER_SITE_OTHER): Payer: Self-pay | Admitting: Vascular Surgery

## 2014-03-28 ENCOUNTER — Encounter: Payer: Self-pay | Admitting: Vascular Surgery

## 2014-03-28 VITALS — BP 109/36 | HR 94 | Resp 18 | Ht 70.0 in | Wt 238.0 lb

## 2014-03-28 DIAGNOSIS — I739 Peripheral vascular disease, unspecified: Secondary | ICD-10-CM

## 2014-03-28 NOTE — Progress Notes (Signed)
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 He returns today for postoperative follow-up. He denies any drainage from the stump. He denies any pain in the stump.  Physical exam:  Filed Vitals:   03/28/14 1230 03/28/14 1239  BP: 109/36   Pulse: 94   Resp: 18   Height: 5\' 10"  (1.778 m)   Weight:  238 lb (107.956 kg)    Left above-knee amputation incision intact with edema in the left leg, no erythema  Half of staples removed today  Assessment: Healing left above-knee amputation still with some edema  Plan: Follow-up 2 weeks for removal of remainder of staples. The patient should have wrapping of his left above-knee amputation stump daily with an Ace wrap to decrease the amount of swelling in his leg.  Ruta Hinds, MD [facility] [place] Office: [phone] Pager: [phone]

## 2014-04-02 ENCOUNTER — Ambulatory Visit: Payer: PRIVATE HEALTH INSURANCE | Admitting: Family Medicine

## 2014-04-10 ENCOUNTER — Encounter: Payer: Self-pay | Admitting: Vascular Surgery

## 2014-04-11 ENCOUNTER — Ambulatory Visit: Payer: PRIVATE HEALTH INSURANCE | Admitting: Vascular Surgery

## 2014-04-17 ENCOUNTER — Encounter: Payer: Self-pay | Admitting: Vascular Surgery

## 2014-04-18 ENCOUNTER — Encounter (HOSPITAL_COMMUNITY): Payer: Self-pay | Admitting: Vascular Surgery

## 2014-04-18 ENCOUNTER — Ambulatory Visit (INDEPENDENT_AMBULATORY_CARE_PROVIDER_SITE_OTHER): Payer: PRIVATE HEALTH INSURANCE | Admitting: Vascular Surgery

## 2014-04-18 VITALS — BP 92/59 | HR 94 | Temp 98.1°F | Resp 14 | Ht 70.0 in | Wt 238.0 lb

## 2014-04-18 DIAGNOSIS — Z4802 Encounter for removal of sutures: Secondary | ICD-10-CM | POA: Insufficient documentation

## 2014-04-18 NOTE — Progress Notes (Signed)
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 He returns today for postoperative follow-up. He denies any drainage from the stump. He denies any pain in the stump.  Physical exam:  Filed Vitals:   04/18/14 1443  BP: 92/59  Pulse: 94  Temp: 98.1 F (36.7 C)  TempSrc: Oral  Resp: 14  Height: 5\' 10"  (1.778 m)  Weight: 238 lb (107.956 kg)  SpO2: 95%   Left above-knee amputation incision intact with edema in the left leg, no erythema  Still with some edema. Well-healed incision. Remainder of staples removed today.  Assessment: Healing left above-knee amputation still with some edema  Plan: Follow-up as needed  Ruta Hinds, MD [facility] Office: [phone] Pager: [phone]

## 2014-04-23 ENCOUNTER — Ambulatory Visit: Payer: PRIVATE HEALTH INSURANCE | Admitting: Family Medicine

## 2014-05-07 ENCOUNTER — Ambulatory Visit: Payer: PRIVATE HEALTH INSURANCE | Admitting: Family Medicine

## 2014-05-09 ENCOUNTER — Telehealth: Payer: Self-pay | Admitting: *Deleted

## 2014-05-09 NOTE — Telephone Encounter (Signed)
Received call from [place], [facility].   Reports thatpatient has been discharged from [place] as of 05/06/2014.  MD to me made aware, .

## 2014-05-10 DIAGNOSIS — N186 End stage renal disease: Secondary | ICD-10-CM | POA: Diagnosis not present

## 2014-05-10 DIAGNOSIS — N19 Unspecified kidney failure: Secondary | ICD-10-CM | POA: Diagnosis not present

## 2014-05-10 DIAGNOSIS — Z992 Dependence on renal dialysis: Secondary | ICD-10-CM | POA: Diagnosis not present

## 2014-05-10 DIAGNOSIS — D509 Iron deficiency anemia, unspecified: Secondary | ICD-10-CM | POA: Diagnosis not present

## 2014-05-10 DIAGNOSIS — D631 Anemia in chronic kidney disease: Secondary | ICD-10-CM | POA: Diagnosis not present

## 2014-05-10 DIAGNOSIS — L03119 Cellulitis of unspecified part of limb: Secondary | ICD-10-CM | POA: Diagnosis not present

## 2014-05-11 DIAGNOSIS — R69 Illness, unspecified: Secondary | ICD-10-CM | POA: Diagnosis not present

## 2014-05-13 DIAGNOSIS — Z992 Dependence on renal dialysis: Secondary | ICD-10-CM | POA: Diagnosis not present

## 2014-05-13 DIAGNOSIS — N186 End stage renal disease: Secondary | ICD-10-CM | POA: Diagnosis not present

## 2014-05-13 DIAGNOSIS — D509 Iron deficiency anemia, unspecified: Secondary | ICD-10-CM | POA: Diagnosis not present

## 2014-05-13 DIAGNOSIS — D631 Anemia in chronic kidney disease: Secondary | ICD-10-CM | POA: Diagnosis not present

## 2014-05-14 ENCOUNTER — Inpatient Hospital Stay: Payer: PRIVATE HEALTH INSURANCE | Admitting: Family Medicine

## 2014-05-14 DIAGNOSIS — I739 Peripheral vascular disease, unspecified: Secondary | ICD-10-CM | POA: Diagnosis not present

## 2014-05-14 DIAGNOSIS — I10 Essential (primary) hypertension: Secondary | ICD-10-CM | POA: Diagnosis not present

## 2014-05-14 DIAGNOSIS — Z89611 Acquired absence of right leg above knee: Secondary | ICD-10-CM | POA: Diagnosis not present

## 2014-05-14 DIAGNOSIS — Z4889 Encounter for other specified surgical aftercare: Secondary | ICD-10-CM | POA: Diagnosis not present

## 2014-05-14 DIAGNOSIS — Z89612 Acquired absence of left leg above knee: Secondary | ICD-10-CM | POA: Diagnosis not present

## 2014-05-14 DIAGNOSIS — E1165 Type 2 diabetes mellitus with hyperglycemia: Secondary | ICD-10-CM | POA: Diagnosis not present

## 2014-05-15 DIAGNOSIS — E1165 Type 2 diabetes mellitus with hyperglycemia: Secondary | ICD-10-CM | POA: Diagnosis not present

## 2014-05-15 DIAGNOSIS — Z89612 Acquired absence of left leg above knee: Secondary | ICD-10-CM | POA: Diagnosis not present

## 2014-05-15 DIAGNOSIS — N186 End stage renal disease: Secondary | ICD-10-CM | POA: Diagnosis not present

## 2014-05-15 DIAGNOSIS — D631 Anemia in chronic kidney disease: Secondary | ICD-10-CM | POA: Diagnosis not present

## 2014-05-15 DIAGNOSIS — D509 Iron deficiency anemia, unspecified: Secondary | ICD-10-CM | POA: Diagnosis not present

## 2014-05-15 DIAGNOSIS — Z992 Dependence on renal dialysis: Secondary | ICD-10-CM | POA: Diagnosis not present

## 2014-05-15 DIAGNOSIS — Z4889 Encounter for other specified surgical aftercare: Secondary | ICD-10-CM | POA: Diagnosis not present

## 2014-05-15 DIAGNOSIS — I739 Peripheral vascular disease, unspecified: Secondary | ICD-10-CM | POA: Diagnosis not present

## 2014-05-15 DIAGNOSIS — I10 Essential (primary) hypertension: Secondary | ICD-10-CM | POA: Diagnosis not present

## 2014-05-15 DIAGNOSIS — Z89611 Acquired absence of right leg above knee: Secondary | ICD-10-CM | POA: Diagnosis not present

## 2014-05-16 DIAGNOSIS — Z89611 Acquired absence of right leg above knee: Secondary | ICD-10-CM | POA: Diagnosis not present

## 2014-05-16 DIAGNOSIS — I10 Essential (primary) hypertension: Secondary | ICD-10-CM | POA: Diagnosis not present

## 2014-05-16 DIAGNOSIS — Z89612 Acquired absence of left leg above knee: Secondary | ICD-10-CM | POA: Diagnosis not present

## 2014-05-16 DIAGNOSIS — E1165 Type 2 diabetes mellitus with hyperglycemia: Secondary | ICD-10-CM | POA: Diagnosis not present

## 2014-05-16 DIAGNOSIS — I739 Peripheral vascular disease, unspecified: Secondary | ICD-10-CM | POA: Diagnosis not present

## 2014-05-16 DIAGNOSIS — Z4889 Encounter for other specified surgical aftercare: Secondary | ICD-10-CM | POA: Diagnosis not present

## 2014-05-17 ENCOUNTER — Telehealth: Payer: Self-pay | Admitting: Family Medicine

## 2014-05-17 DIAGNOSIS — Z992 Dependence on renal dialysis: Secondary | ICD-10-CM | POA: Diagnosis not present

## 2014-05-17 DIAGNOSIS — D631 Anemia in chronic kidney disease: Secondary | ICD-10-CM | POA: Diagnosis not present

## 2014-05-17 DIAGNOSIS — N186 End stage renal disease: Secondary | ICD-10-CM | POA: Diagnosis not present

## 2014-05-17 DIAGNOSIS — D509 Iron deficiency anemia, unspecified: Secondary | ICD-10-CM | POA: Diagnosis not present

## 2014-05-17 MED ORDER — DIPHENOXYLATE-ATROPINE 2.5-0.025 MG PO TABS: 1.0000 | ORAL_TABLET | Freq: Four times a day (QID) | ORAL | Status: DC | PRN

## 2014-05-17 NOTE — Telephone Encounter (Signed)
Okay to refill? 

## 2014-05-17 NOTE — Telephone Encounter (Signed)
Rx to pharmacy and pt called

## 2014-05-17 NOTE — Telephone Encounter (Signed)
Requesting refill of his Lomotil?

## 2014-05-20 DIAGNOSIS — D509 Iron deficiency anemia, unspecified: Secondary | ICD-10-CM | POA: Diagnosis not present

## 2014-05-20 DIAGNOSIS — N186 End stage renal disease: Secondary | ICD-10-CM | POA: Diagnosis not present

## 2014-05-20 DIAGNOSIS — E119 Type 2 diabetes mellitus without complications: Secondary | ICD-10-CM | POA: Diagnosis not present

## 2014-05-20 DIAGNOSIS — Z992 Dependence on renal dialysis: Secondary | ICD-10-CM | POA: Diagnosis not present

## 2014-05-20 DIAGNOSIS — D631 Anemia in chronic kidney disease: Secondary | ICD-10-CM | POA: Diagnosis not present

## 2014-05-21 DIAGNOSIS — Z4889 Encounter for other specified surgical aftercare: Secondary | ICD-10-CM | POA: Diagnosis not present

## 2014-05-21 DIAGNOSIS — E1165 Type 2 diabetes mellitus with hyperglycemia: Secondary | ICD-10-CM | POA: Diagnosis not present

## 2014-05-21 DIAGNOSIS — I739 Peripheral vascular disease, unspecified: Secondary | ICD-10-CM | POA: Diagnosis not present

## 2014-05-21 DIAGNOSIS — Z89612 Acquired absence of left leg above knee: Secondary | ICD-10-CM | POA: Diagnosis not present

## 2014-05-21 DIAGNOSIS — Z89611 Acquired absence of right leg above knee: Secondary | ICD-10-CM | POA: Diagnosis not present

## 2014-05-21 DIAGNOSIS — I10 Essential (primary) hypertension: Secondary | ICD-10-CM | POA: Diagnosis not present

## 2014-05-22 ENCOUNTER — Inpatient Hospital Stay: Payer: PRIVATE HEALTH INSURANCE | Admitting: Family Medicine

## 2014-05-22 DIAGNOSIS — D631 Anemia in chronic kidney disease: Secondary | ICD-10-CM | POA: Diagnosis not present

## 2014-05-22 DIAGNOSIS — N186 End stage renal disease: Secondary | ICD-10-CM | POA: Diagnosis not present

## 2014-05-22 DIAGNOSIS — Z992 Dependence on renal dialysis: Secondary | ICD-10-CM | POA: Diagnosis not present

## 2014-05-22 DIAGNOSIS — D509 Iron deficiency anemia, unspecified: Secondary | ICD-10-CM | POA: Diagnosis not present

## 2014-05-23 ENCOUNTER — Telehealth: Payer: Self-pay | Admitting: Family Medicine

## 2014-05-23 DIAGNOSIS — Z4889 Encounter for other specified surgical aftercare: Secondary | ICD-10-CM | POA: Diagnosis not present

## 2014-05-23 DIAGNOSIS — Z89611 Acquired absence of right leg above knee: Secondary | ICD-10-CM | POA: Diagnosis not present

## 2014-05-23 DIAGNOSIS — E1165 Type 2 diabetes mellitus with hyperglycemia: Secondary | ICD-10-CM | POA: Diagnosis not present

## 2014-05-23 DIAGNOSIS — I739 Peripheral vascular disease, unspecified: Secondary | ICD-10-CM | POA: Diagnosis not present

## 2014-05-23 DIAGNOSIS — Z89612 Acquired absence of left leg above knee: Secondary | ICD-10-CM | POA: Diagnosis not present

## 2014-05-23 DIAGNOSIS — I10 Essential (primary) hypertension: Secondary | ICD-10-CM | POA: Diagnosis not present

## 2014-05-23 NOTE — Telephone Encounter (Signed)
[phone] Pt is wanting to speak to you about medication

## 2014-05-24 ENCOUNTER — Inpatient Hospital Stay: Payer: Medicaid Other | Admitting: Family Medicine

## 2014-05-24 ENCOUNTER — Encounter: Payer: Self-pay | Admitting: *Deleted

## 2014-05-24 DIAGNOSIS — N186 End stage renal disease: Secondary | ICD-10-CM | POA: Diagnosis not present

## 2014-05-24 DIAGNOSIS — Z992 Dependence on renal dialysis: Secondary | ICD-10-CM | POA: Diagnosis not present

## 2014-05-24 DIAGNOSIS — D631 Anemia in chronic kidney disease: Secondary | ICD-10-CM | POA: Diagnosis not present

## 2014-05-24 DIAGNOSIS — D509 Iron deficiency anemia, unspecified: Secondary | ICD-10-CM | POA: Diagnosis not present

## 2014-05-24 MED ORDER — OXYCODONE-ACETAMINOPHEN 10-325 MG PO TABS: 1.0000 | ORAL_TABLET | Freq: Two times a day (BID) | ORAL | Status: DC | PRN

## 2014-05-24 NOTE — Telephone Encounter (Signed)
Okay to refill? 

## 2014-05-24 NOTE — Telephone Encounter (Signed)
Call placed to patient. No answer. No VM.  

## 2014-05-24 NOTE — Telephone Encounter (Signed)
Prescription printed and patient made aware to come to office to pick up.  

## 2014-05-24 NOTE — Telephone Encounter (Signed)
Received return call.   Patient requesting refill on Percocet 10/325mg .   Ok to refill??  Last office visit 01/22/2014.  Last refill 03/12/2014.

## 2014-05-26 DIAGNOSIS — Z992 Dependence on renal dialysis: Secondary | ICD-10-CM | POA: Diagnosis not present

## 2014-05-26 DIAGNOSIS — N19 Unspecified kidney failure: Secondary | ICD-10-CM | POA: Diagnosis not present

## 2014-05-26 DIAGNOSIS — L03119 Cellulitis of unspecified part of limb: Secondary | ICD-10-CM | POA: Diagnosis not present

## 2014-05-27 DIAGNOSIS — D509 Iron deficiency anemia, unspecified: Secondary | ICD-10-CM | POA: Diagnosis not present

## 2014-05-27 DIAGNOSIS — Z992 Dependence on renal dialysis: Secondary | ICD-10-CM | POA: Diagnosis not present

## 2014-05-27 DIAGNOSIS — N186 End stage renal disease: Secondary | ICD-10-CM | POA: Diagnosis not present

## 2014-05-27 DIAGNOSIS — D631 Anemia in chronic kidney disease: Secondary | ICD-10-CM | POA: Diagnosis not present

## 2014-05-28 ENCOUNTER — Telehealth: Payer: Self-pay | Admitting: Family Medicine

## 2014-05-28 DIAGNOSIS — E1165 Type 2 diabetes mellitus with hyperglycemia: Secondary | ICD-10-CM | POA: Diagnosis not present

## 2014-05-28 DIAGNOSIS — I10 Essential (primary) hypertension: Secondary | ICD-10-CM | POA: Diagnosis not present

## 2014-05-28 DIAGNOSIS — Z89611 Acquired absence of right leg above knee: Secondary | ICD-10-CM | POA: Diagnosis not present

## 2014-05-28 DIAGNOSIS — I739 Peripheral vascular disease, unspecified: Secondary | ICD-10-CM | POA: Diagnosis not present

## 2014-05-28 DIAGNOSIS — Z4889 Encounter for other specified surgical aftercare: Secondary | ICD-10-CM | POA: Diagnosis not present

## 2014-05-28 DIAGNOSIS — Z89612 Acquired absence of left leg above knee: Secondary | ICD-10-CM | POA: Diagnosis not present

## 2014-05-28 MED ORDER — MIDODRINE HCL 5 MG PO TABS: 5.0000 mg | ORAL_TABLET | Freq: Three times a day (TID) | ORAL | Status: DC

## 2014-05-28 MED ORDER — TICAGRELOR 90 MG PO TABS: 90.0000 mg | ORAL_TABLET | Freq: Two times a day (BID) | ORAL | Status: DC

## 2014-05-28 NOTE — Telephone Encounter (Signed)
Pt just now home form Rehab.  Has appointmnet here 1/26 to follow up with Dr Buelah Manis. On two new medications which he does not have at home and needs RX.  Brilinta 90 mg BID  Midodrine 5 mg TID  One month supply each sent.

## 2014-05-29 DIAGNOSIS — N186 End stage renal disease: Secondary | ICD-10-CM | POA: Diagnosis not present

## 2014-05-29 DIAGNOSIS — D509 Iron deficiency anemia, unspecified: Secondary | ICD-10-CM | POA: Diagnosis not present

## 2014-05-29 DIAGNOSIS — D631 Anemia in chronic kidney disease: Secondary | ICD-10-CM | POA: Diagnosis not present

## 2014-05-29 DIAGNOSIS — Z992 Dependence on renal dialysis: Secondary | ICD-10-CM | POA: Diagnosis not present

## 2014-06-03 NOTE — Telephone Encounter (Signed)
noted 

## 2014-06-04 ENCOUNTER — Inpatient Hospital Stay: Payer: Medicaid Other | Admitting: Family Medicine

## 2014-06-04 DIAGNOSIS — D509 Iron deficiency anemia, unspecified: Secondary | ICD-10-CM | POA: Diagnosis not present

## 2014-06-04 DIAGNOSIS — D631 Anemia in chronic kidney disease: Secondary | ICD-10-CM | POA: Diagnosis not present

## 2014-06-04 DIAGNOSIS — Z992 Dependence on renal dialysis: Secondary | ICD-10-CM | POA: Diagnosis not present

## 2014-06-04 DIAGNOSIS — N186 End stage renal disease: Secondary | ICD-10-CM | POA: Diagnosis not present

## 2014-06-05 DIAGNOSIS — Z992 Dependence on renal dialysis: Secondary | ICD-10-CM | POA: Diagnosis not present

## 2014-06-05 DIAGNOSIS — N186 End stage renal disease: Secondary | ICD-10-CM | POA: Diagnosis not present

## 2014-06-05 DIAGNOSIS — D631 Anemia in chronic kidney disease: Secondary | ICD-10-CM | POA: Diagnosis not present

## 2014-06-05 DIAGNOSIS — D509 Iron deficiency anemia, unspecified: Secondary | ICD-10-CM | POA: Diagnosis not present

## 2014-06-07 DIAGNOSIS — N186 End stage renal disease: Secondary | ICD-10-CM | POA: Diagnosis not present

## 2014-06-07 DIAGNOSIS — D509 Iron deficiency anemia, unspecified: Secondary | ICD-10-CM | POA: Diagnosis not present

## 2014-06-07 DIAGNOSIS — D631 Anemia in chronic kidney disease: Secondary | ICD-10-CM | POA: Diagnosis not present

## 2014-06-07 DIAGNOSIS — Z992 Dependence on renal dialysis: Secondary | ICD-10-CM | POA: Diagnosis not present

## 2014-06-09 DIAGNOSIS — N186 End stage renal disease: Secondary | ICD-10-CM | POA: Diagnosis not present

## 2014-06-09 DIAGNOSIS — Z992 Dependence on renal dialysis: Secondary | ICD-10-CM | POA: Diagnosis not present

## 2014-06-10 DIAGNOSIS — D631 Anemia in chronic kidney disease: Secondary | ICD-10-CM | POA: Diagnosis not present

## 2014-06-10 DIAGNOSIS — N186 End stage renal disease: Secondary | ICD-10-CM | POA: Diagnosis not present

## 2014-06-10 DIAGNOSIS — Z992 Dependence on renal dialysis: Secondary | ICD-10-CM | POA: Diagnosis not present

## 2014-06-10 DIAGNOSIS — I739 Peripheral vascular disease, unspecified: Secondary | ICD-10-CM | POA: Diagnosis not present

## 2014-06-10 DIAGNOSIS — Z4889 Encounter for other specified surgical aftercare: Secondary | ICD-10-CM | POA: Diagnosis not present

## 2014-06-10 DIAGNOSIS — Z89611 Acquired absence of right leg above knee: Secondary | ICD-10-CM | POA: Diagnosis not present

## 2014-06-10 DIAGNOSIS — D509 Iron deficiency anemia, unspecified: Secondary | ICD-10-CM | POA: Diagnosis not present

## 2014-06-10 DIAGNOSIS — I10 Essential (primary) hypertension: Secondary | ICD-10-CM | POA: Diagnosis not present

## 2014-06-10 DIAGNOSIS — E1165 Type 2 diabetes mellitus with hyperglycemia: Secondary | ICD-10-CM | POA: Diagnosis not present

## 2014-06-10 DIAGNOSIS — Z89612 Acquired absence of left leg above knee: Secondary | ICD-10-CM | POA: Diagnosis not present

## 2014-06-11 DIAGNOSIS — E11351 Type 2 diabetes mellitus with proliferative diabetic retinopathy with macular edema: Secondary | ICD-10-CM | POA: Diagnosis not present

## 2014-06-11 LAB — HM DIABETES EYE EXAM

## 2014-06-12 DIAGNOSIS — N186 End stage renal disease: Secondary | ICD-10-CM | POA: Diagnosis not present

## 2014-06-12 DIAGNOSIS — Z992 Dependence on renal dialysis: Secondary | ICD-10-CM | POA: Diagnosis not present

## 2014-06-12 DIAGNOSIS — D509 Iron deficiency anemia, unspecified: Secondary | ICD-10-CM | POA: Diagnosis not present

## 2014-06-12 DIAGNOSIS — D631 Anemia in chronic kidney disease: Secondary | ICD-10-CM | POA: Diagnosis not present

## 2014-06-14 DIAGNOSIS — N186 End stage renal disease: Secondary | ICD-10-CM | POA: Diagnosis not present

## 2014-06-14 DIAGNOSIS — D509 Iron deficiency anemia, unspecified: Secondary | ICD-10-CM | POA: Diagnosis not present

## 2014-06-14 DIAGNOSIS — Z992 Dependence on renal dialysis: Secondary | ICD-10-CM | POA: Diagnosis not present

## 2014-06-14 DIAGNOSIS — D631 Anemia in chronic kidney disease: Secondary | ICD-10-CM | POA: Diagnosis not present

## 2014-06-17 DIAGNOSIS — N186 End stage renal disease: Secondary | ICD-10-CM | POA: Diagnosis not present

## 2014-06-17 DIAGNOSIS — Z992 Dependence on renal dialysis: Secondary | ICD-10-CM | POA: Diagnosis not present

## 2014-06-17 DIAGNOSIS — D509 Iron deficiency anemia, unspecified: Secondary | ICD-10-CM | POA: Diagnosis not present

## 2014-06-17 DIAGNOSIS — D631 Anemia in chronic kidney disease: Secondary | ICD-10-CM | POA: Diagnosis not present

## 2014-06-18 ENCOUNTER — Encounter: Payer: Self-pay | Admitting: Family Medicine

## 2014-06-18 ENCOUNTER — Other Ambulatory Visit: Payer: Self-pay | Admitting: *Deleted

## 2014-06-18 ENCOUNTER — Ambulatory Visit (INDEPENDENT_AMBULATORY_CARE_PROVIDER_SITE_OTHER): Payer: Medicare Other | Admitting: Family Medicine

## 2014-06-18 VITALS — BP 110/60 | HR 60 | Temp 98.4°F | Resp 14

## 2014-06-18 DIAGNOSIS — N186 End stage renal disease: Secondary | ICD-10-CM | POA: Diagnosis not present

## 2014-06-18 DIAGNOSIS — E118 Type 2 diabetes mellitus with unspecified complications: Secondary | ICD-10-CM | POA: Diagnosis not present

## 2014-06-18 DIAGNOSIS — I9589 Other hypotension: Secondary | ICD-10-CM | POA: Diagnosis not present

## 2014-06-18 DIAGNOSIS — Z89611 Acquired absence of right leg above knee: Secondary | ICD-10-CM | POA: Diagnosis not present

## 2014-06-18 DIAGNOSIS — Z992 Dependence on renal dialysis: Secondary | ICD-10-CM

## 2014-06-18 DIAGNOSIS — Z89612 Acquired absence of left leg above knee: Secondary | ICD-10-CM

## 2014-06-18 DIAGNOSIS — E785 Hyperlipidemia, unspecified: Secondary | ICD-10-CM | POA: Diagnosis not present

## 2014-06-18 LAB — LIPID PANEL
CHOL/HDL RATIO: 2.8 ratio
Cholesterol: 97 mg/dL (ref 0–200)
HDL: 35 mg/dL — ABNORMAL LOW (ref >39)
LDL CALC: 47 mg/dL (ref 0–99)
Triglycerides: 76 mg/dL (ref <150)
VLDL: 15 mg/dL (ref 0–40)

## 2014-06-18 LAB — HEMOGLOBIN A1C
HEMOGLOBIN A1C: 6.6 % — AB (ref <5.7)
Mean Plasma Glucose: 143 mg/dL — ABNORMAL HIGH (ref <117)

## 2014-06-18 MED ORDER — MECLIZINE HCL 25 MG PO TABS: 25.0000 mg | ORAL_TABLET | Freq: Three times a day (TID) | ORAL | Status: AC | PRN

## 2014-06-18 MED ORDER — BLOOD GLUCOSE MONITOR KIT: PACK | Status: DC

## 2014-06-18 MED ORDER — LORATADINE 10 MG PO TABS: 10.0000 mg | ORAL_TABLET | Freq: Every day | ORAL | Status: DC

## 2014-06-18 MED ORDER — SEVELAMER CARBONATE 800 MG PO TABS: ORAL_TABLET | ORAL | Status: DC

## 2014-06-18 MED ORDER — MIDODRINE HCL 5 MG PO TABS: 5.0000 mg | ORAL_TABLET | Freq: Three times a day (TID) | ORAL | Status: DC

## 2014-06-18 MED ORDER — CINACALCET HCL 90 MG PO TABS: 90.0000 mg | ORAL_TABLET | Freq: Every day | ORAL | Status: AC

## 2014-06-18 MED ORDER — ATORVASTATIN CALCIUM 80 MG PO TABS: 80.0000 mg | ORAL_TABLET | Freq: Every evening | ORAL | Status: DC

## 2014-06-18 MED ORDER — DICYCLOMINE HCL 10 MG PO CAPS: 10.0000 mg | ORAL_CAPSULE | Freq: Three times a day (TID) | ORAL | Status: AC

## 2014-06-18 MED ORDER — OXYCODONE-ACETAMINOPHEN 10-325 MG PO TABS: 1.0000 | ORAL_TABLET | Freq: Two times a day (BID) | ORAL | Status: DC | PRN

## 2014-06-18 MED ORDER — LABETALOL HCL 200 MG PO TABS: 200.0000 mg | ORAL_TABLET | Freq: Two times a day (BID) | ORAL | Status: DC

## 2014-06-18 MED ORDER — TICAGRELOR 90 MG PO TABS: 90.0000 mg | ORAL_TABLET | Freq: Two times a day (BID) | ORAL | Status: AC

## 2014-06-18 NOTE — Assessment & Plan Note (Signed)
Recheck A1C , may need to restart insulin therapy, previoulsy on lantus  Will also provide with new meter/testing supplies

## 2014-06-18 NOTE — Patient Instructions (Signed)
Pain medication given Continue current medications We will call with your blood sugars New blood glucose meter, check sugars in the morning goal < 120 fasting F/U 3 months
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
## 2014-06-18 NOTE — Assessment & Plan Note (Signed)
Check lipids on lipitor 80mg 

## 2014-06-18 NOTE — Assessment & Plan Note (Signed)
Doing well with midodrine meds refilled

## 2014-06-19 DIAGNOSIS — D509 Iron deficiency anemia, unspecified: Secondary | ICD-10-CM | POA: Diagnosis not present

## 2014-06-19 DIAGNOSIS — D631 Anemia in chronic kidney disease: Secondary | ICD-10-CM | POA: Diagnosis not present

## 2014-06-19 DIAGNOSIS — Z992 Dependence on renal dialysis: Secondary | ICD-10-CM | POA: Diagnosis not present

## 2014-06-19 DIAGNOSIS — N186 End stage renal disease: Secondary | ICD-10-CM | POA: Diagnosis not present

## 2014-06-21 DIAGNOSIS — N186 End stage renal disease: Secondary | ICD-10-CM | POA: Diagnosis not present

## 2014-06-21 DIAGNOSIS — D631 Anemia in chronic kidney disease: Secondary | ICD-10-CM | POA: Diagnosis not present

## 2014-06-21 DIAGNOSIS — Z992 Dependence on renal dialysis: Secondary | ICD-10-CM | POA: Diagnosis not present

## 2014-06-21 DIAGNOSIS — D509 Iron deficiency anemia, unspecified: Secondary | ICD-10-CM | POA: Diagnosis not present

## 2014-06-25 DIAGNOSIS — N186 End stage renal disease: Secondary | ICD-10-CM | POA: Diagnosis not present

## 2014-06-25 DIAGNOSIS — Z992 Dependence on renal dialysis: Secondary | ICD-10-CM | POA: Diagnosis not present

## 2014-06-25 DIAGNOSIS — D631 Anemia in chronic kidney disease: Secondary | ICD-10-CM | POA: Diagnosis not present

## 2014-06-25 DIAGNOSIS — D509 Iron deficiency anemia, unspecified: Secondary | ICD-10-CM | POA: Diagnosis not present

## 2014-06-26 DIAGNOSIS — N186 End stage renal disease: Secondary | ICD-10-CM | POA: Diagnosis not present

## 2014-06-26 DIAGNOSIS — Z992 Dependence on renal dialysis: Secondary | ICD-10-CM | POA: Diagnosis not present

## 2014-06-26 DIAGNOSIS — D509 Iron deficiency anemia, unspecified: Secondary | ICD-10-CM | POA: Diagnosis not present

## 2014-06-26 DIAGNOSIS — N19 Unspecified kidney failure: Secondary | ICD-10-CM | POA: Diagnosis not present

## 2014-06-26 DIAGNOSIS — L03119 Cellulitis of unspecified part of limb: Secondary | ICD-10-CM | POA: Diagnosis not present

## 2014-06-26 DIAGNOSIS — D631 Anemia in chronic kidney disease: Secondary | ICD-10-CM | POA: Diagnosis not present

## 2014-06-27 DIAGNOSIS — Z4889 Encounter for other specified surgical aftercare: Secondary | ICD-10-CM | POA: Diagnosis not present

## 2014-06-27 DIAGNOSIS — Z89612 Acquired absence of left leg above knee: Secondary | ICD-10-CM | POA: Diagnosis not present

## 2014-06-27 DIAGNOSIS — E1165 Type 2 diabetes mellitus with hyperglycemia: Secondary | ICD-10-CM | POA: Diagnosis not present

## 2014-06-27 DIAGNOSIS — I10 Essential (primary) hypertension: Secondary | ICD-10-CM | POA: Diagnosis not present

## 2014-06-27 DIAGNOSIS — I739 Peripheral vascular disease, unspecified: Secondary | ICD-10-CM | POA: Diagnosis not present

## 2014-06-27 DIAGNOSIS — Z89611 Acquired absence of right leg above knee: Secondary | ICD-10-CM | POA: Diagnosis not present

## 2014-06-28 DIAGNOSIS — Z992 Dependence on renal dialysis: Secondary | ICD-10-CM | POA: Diagnosis not present

## 2014-06-28 DIAGNOSIS — N186 End stage renal disease: Secondary | ICD-10-CM | POA: Diagnosis not present

## 2014-06-28 DIAGNOSIS — D631 Anemia in chronic kidney disease: Secondary | ICD-10-CM | POA: Diagnosis not present

## 2014-06-28 DIAGNOSIS — D509 Iron deficiency anemia, unspecified: Secondary | ICD-10-CM | POA: Diagnosis not present

## 2014-07-01 DIAGNOSIS — D631 Anemia in chronic kidney disease: Secondary | ICD-10-CM | POA: Diagnosis not present

## 2014-07-01 DIAGNOSIS — N186 End stage renal disease: Secondary | ICD-10-CM | POA: Diagnosis not present

## 2014-07-01 DIAGNOSIS — D509 Iron deficiency anemia, unspecified: Secondary | ICD-10-CM | POA: Diagnosis not present

## 2014-07-01 DIAGNOSIS — Z992 Dependence on renal dialysis: Secondary | ICD-10-CM | POA: Diagnosis not present

## 2014-07-02 ENCOUNTER — Telehealth: Payer: Self-pay | Admitting: Family Medicine

## 2014-07-02 DIAGNOSIS — Z4889 Encounter for other specified surgical aftercare: Secondary | ICD-10-CM | POA: Diagnosis not present

## 2014-07-02 DIAGNOSIS — E1165 Type 2 diabetes mellitus with hyperglycemia: Secondary | ICD-10-CM | POA: Diagnosis not present

## 2014-07-02 DIAGNOSIS — Z89611 Acquired absence of right leg above knee: Secondary | ICD-10-CM | POA: Diagnosis not present

## 2014-07-02 DIAGNOSIS — Z89612 Acquired absence of left leg above knee: Secondary | ICD-10-CM | POA: Diagnosis not present

## 2014-07-02 DIAGNOSIS — I10 Essential (primary) hypertension: Secondary | ICD-10-CM | POA: Diagnosis not present

## 2014-07-02 DIAGNOSIS — I739 Peripheral vascular disease, unspecified: Secondary | ICD-10-CM | POA: Diagnosis not present

## 2014-07-02 MED ORDER — DIPHENOXYLATE-ATROPINE 2.5-0.025 MG PO TABS: 1.0000 | ORAL_TABLET | Freq: Four times a day (QID) | ORAL | Status: DC | PRN

## 2014-07-02 NOTE — Telephone Encounter (Signed)
Ok to refill Lomotil? 

## 2014-07-02 NOTE — Telephone Encounter (Signed)
[facility]  Patient needs refill on his diphenoxylate

## 2014-07-02 NOTE — Telephone Encounter (Signed)
Medication called to pharmacy. 

## 2014-07-02 NOTE — Telephone Encounter (Signed)
okay

## 2014-07-03 DIAGNOSIS — D509 Iron deficiency anemia, unspecified: Secondary | ICD-10-CM | POA: Diagnosis not present

## 2014-07-03 DIAGNOSIS — D631 Anemia in chronic kidney disease: Secondary | ICD-10-CM | POA: Diagnosis not present

## 2014-07-03 DIAGNOSIS — N186 End stage renal disease: Secondary | ICD-10-CM | POA: Diagnosis not present

## 2014-07-03 DIAGNOSIS — Z992 Dependence on renal dialysis: Secondary | ICD-10-CM | POA: Diagnosis not present

## 2014-07-05 DIAGNOSIS — N186 End stage renal disease: Secondary | ICD-10-CM | POA: Diagnosis not present

## 2014-07-05 DIAGNOSIS — D509 Iron deficiency anemia, unspecified: Secondary | ICD-10-CM | POA: Diagnosis not present

## 2014-07-05 DIAGNOSIS — D631 Anemia in chronic kidney disease: Secondary | ICD-10-CM | POA: Diagnosis not present

## 2014-07-05 DIAGNOSIS — Z992 Dependence on renal dialysis: Secondary | ICD-10-CM | POA: Diagnosis not present

## 2014-07-07 ENCOUNTER — Emergency Department (HOSPITAL_COMMUNITY): Payer: Medicare Other

## 2014-07-07 ENCOUNTER — Encounter (HOSPITAL_COMMUNITY): Payer: Self-pay

## 2014-07-07 ENCOUNTER — Emergency Department (HOSPITAL_COMMUNITY)
Admission: EM | Admit: 2014-07-07 | Discharge: 2014-07-07 | Disposition: A | Discharge status: Home / Self Care | Payer: Medicare Other | Attending: Emergency Medicine | Admitting: Emergency Medicine

## 2014-07-07 DIAGNOSIS — I96 Gangrene, not elsewhere classified: Secondary | ICD-10-CM | POA: Insufficient documentation

## 2014-07-07 DIAGNOSIS — I120 Hypertensive chronic kidney disease with stage 5 chronic kidney disease or end stage renal disease: Secondary | ICD-10-CM | POA: Insufficient documentation

## 2014-07-07 DIAGNOSIS — G8929 Other chronic pain: Secondary | ICD-10-CM | POA: Insufficient documentation

## 2014-07-07 DIAGNOSIS — Z7401 Bed confinement status: Secondary | ICD-10-CM | POA: Diagnosis not present

## 2014-07-07 DIAGNOSIS — M79646 Pain in unspecified finger(s): Secondary | ICD-10-CM | POA: Diagnosis not present

## 2014-07-07 DIAGNOSIS — M869 Osteomyelitis, unspecified: Secondary | ICD-10-CM | POA: Diagnosis not present

## 2014-07-07 DIAGNOSIS — E11319 Type 2 diabetes mellitus with unspecified diabetic retinopathy without macular edema: Secondary | ICD-10-CM | POA: Insufficient documentation

## 2014-07-07 DIAGNOSIS — Z79899 Other long term (current) drug therapy: Secondary | ICD-10-CM | POA: Insufficient documentation

## 2014-07-07 DIAGNOSIS — Z7982 Long term (current) use of aspirin: Secondary | ICD-10-CM | POA: Diagnosis not present

## 2014-07-07 DIAGNOSIS — Z992 Dependence on renal dialysis: Secondary | ICD-10-CM | POA: Diagnosis not present

## 2014-07-07 DIAGNOSIS — R279 Unspecified lack of coordination: Secondary | ICD-10-CM | POA: Diagnosis not present

## 2014-07-07 DIAGNOSIS — Z862 Personal history of diseases of the blood and blood-forming organs and certain disorders involving the immune mechanism: Secondary | ICD-10-CM | POA: Diagnosis not present

## 2014-07-07 DIAGNOSIS — M86142 Other acute osteomyelitis, left hand: Secondary | ICD-10-CM | POA: Insufficient documentation

## 2014-07-07 DIAGNOSIS — M79642 Pain in left hand: Secondary | ICD-10-CM | POA: Diagnosis present

## 2014-07-07 DIAGNOSIS — Z8619 Personal history of other infectious and parasitic diseases: Secondary | ICD-10-CM | POA: Diagnosis not present

## 2014-07-07 DIAGNOSIS — N186 End stage renal disease: Secondary | ICD-10-CM | POA: Diagnosis not present

## 2014-07-07 DIAGNOSIS — I2510 Atherosclerotic heart disease of native coronary artery without angina pectoris: Secondary | ICD-10-CM | POA: Diagnosis not present

## 2014-07-07 DIAGNOSIS — S61206A Unspecified open wound of right little finger without damage to nail, initial encounter: Secondary | ICD-10-CM | POA: Diagnosis not present

## 2014-07-07 LAB — CBC WITH DIFFERENTIAL/PLATELET
BASOS ABS: 0 10*3/uL (ref 0.0–0.1)
Basophils Relative: 0 % (ref 0–1)
Eosinophils Absolute: 0.1 10*3/uL (ref 0.0–0.7)
Eosinophils Relative: 3 % (ref 0–5)
HCT: 39.1 % (ref 39.0–52.0)
Hemoglobin: 12.1 g/dL — ABNORMAL LOW (ref 13.0–17.0)
Lymphocytes Relative: 30 % (ref 12–46)
Lymphs Abs: 1.4 10*3/uL (ref 0.7–4.0)
MCH: 30.6 pg (ref 26.0–34.0)
MCHC: 30.9 g/dL (ref 30.0–36.0)
MCV: 99 fL (ref 78.0–100.0)
Monocytes Absolute: 0.4 10*3/uL (ref 0.1–1.0)
Monocytes Relative: 8 % (ref 3–12)
NEUTROS ABS: 2.8 10*3/uL (ref 1.7–7.7)
Neutrophils Relative %: 59 % (ref 43–77)
Platelets: 78 10*3/uL — ABNORMAL LOW (ref 150–400)
RBC: 3.95 MIL/uL — ABNORMAL LOW (ref 4.22–5.81)
RDW: 17 % — ABNORMAL HIGH (ref 11.5–15.5)
WBC: 4.7 10*3/uL (ref 4.0–10.5)

## 2014-07-07 LAB — BASIC METABOLIC PANEL
Anion gap: 10 (ref 5–15)
BUN: 34 mg/dL — ABNORMAL HIGH (ref 6–23)
CO2: 27 mmol/L (ref 19–32)
CREATININE: 6.72 mg/dL — AB (ref 0.50–1.35)
Calcium: 9.7 mg/dL (ref 8.4–10.5)
Chloride: 100 mmol/L (ref 96–112)
GFR calc non Af Amer: 8 mL/min — ABNORMAL LOW (ref >90)
GFR, EST AFRICAN AMERICAN: 9 mL/min — AB (ref >90)
Glucose, Bld: 137 mg/dL — ABNORMAL HIGH (ref 70–99)
POTASSIUM: 4 mmol/L (ref 3.5–5.1)
SODIUM: 137 mmol/L (ref 135–145)

## 2014-07-07 LAB — I-STAT CG4 LACTIC ACID, ED
LACTIC ACID, VENOUS: 1.84 mmol/L (ref 0.5–2.0)
Lactic Acid, Venous: 2.67 mmol/L (ref 0.5–2.0)

## 2014-07-07 LAB — PROTIME-INR
INR: 1.21 (ref 0.00–1.49)
Prothrombin Time: 15.4 seconds — ABNORMAL HIGH (ref 11.6–15.2)

## 2014-07-07 MED ORDER — SODIUM CHLORIDE 0.9 % IV BOLUS (SEPSIS): 500.0000 mL | Freq: Once | INTRAVENOUS | Status: DC

## 2014-07-07 MED ORDER — AMOXICILLIN-POT CLAVULANATE 500-125 MG PO TABS: 1.0000 | ORAL_TABLET | Freq: Every day | ORAL | Status: DC

## 2014-07-07 MED ORDER — AMOXICILLIN-POT CLAVULANATE 875-125 MG PO TABS
1.0000 | ORAL_TABLET | Freq: Once | ORAL | Status: AC
  Administered 2014-07-07: 1 via ORAL
  Filled 2014-07-07: qty 1
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
## 2014-07-07 NOTE — Discharge Instructions (Signed)
Bone and Joint Infections You have a bone infection of your finger. You likely need an amputation. Follow up with Dr. Oneida Alar and Dr. Burney Gauze.  If you are unable to see Dr. Burney Gauze, see Dr. Aline Brochure. Return to the ED with new or worsening symptoms. Joint infections are called septic or infectious arthritis. An infected joint may damage cartilage and tissue very quickly. This may destroy the joint. Bone infections (osteomyelitis) may last for years. Joints may become stiff if left untreated. Bacteria are the most common cause. Other causes include viruses and fungi, but these are more rare. Bone and joint infections usually come from injury or infection elsewhere in your body; the germs are carried to your bones or joints through the bloodstream.  CAUSES   Blood-carried germs from an infection elsewhere in your body can eventually spread to a bone or joint. The germ staphylococcus is the most common cause of both osteomyelitis and septic arthritis.  An injury can introduce germs into your bones or joints. SYMPTOMS   Weight loss.  Tiredness.  Chills and fever.  Bone or joint pain at rest and with activity.  Tenderness when touching the area or bending the joint.  Refusal to bear weight on a leg or inability to use an arm due to pain.  Decreased range of motion in a joint.  Skin redness, warmth, and tenderness.  Open skin sores and drainage. RISK FACTORS Children, the elderly, and those with weak immune systems are at increased risk of bone and joint infections. It is more common in people with HIV infections and with people on chemotherapy. People are also at increased risk if they have surgery where metal implants are used to stabilize the bone. Plates, screws, or artificial joints provide a surface that bacteria can stick on. Such a growth of bacteria is called biofilm. The biofilm protects bacteria from antibiotics and bodily defenses. This allows germs to multiply. Other reasons for  increased risks include:   Having previous surgery or injury of a bone or joint.  Being on high-dose corticosteroids and immunosuppressive medications that weaken your body's resistance to germs.  Diabetes and long-standing diseases.  Use of intravenous street drugs.  Being on hemodialysis.  Having a history of urinary tract infections.  Removal of your spleen (splenectomy). This weakens your immunity.  Chronic viral infections such as HIV or AIDS.  Lack of sensation such as paraplegia, quadriplegia, or spina bifida. DIAGNOSIS   Increased numbers of white blood cells in your blood may indicate infection. Some times your caregivers are able to identify the infecting germs by testing your blood. Inflammatory markers present in your bloodstream such as an erythrocyte sedimentation rate (ESR or sed rate) or c-reactive protein (CRP) can be indicators of deep infection.  Bone scans and X-ray exams are necessary for diagnosing osteomyelitis. They may help your caregiver find the infected areas. Other studies may give more detailed information. They may help detect fluid collections around a joint, abnormal bone surfaces, or be useful in diagnosing septic arthritis. They can find soft tissue swelling and find excess fluid in an infected joint or the adjacent bone. These tests include:  Ultrasound.  CT (computerized tomography).  MRI (magnetic resonance imaging).  The best test for diagnosing a bone or joint infection is an aspiration or biopsy. Your caregiver will usually use a local anesthetic. He or she can then remove tissue from a bone injury or use a needle to take fluids from an infected joint. A local anesthetic medication numbs  the area to be biopsied. Often biopsies are done in the operating room under general anesthesia. This means you will be asleep during the procedure. Tests performed on these samples can identify an infection. TREATMENT   Treatment can help control  long-standing infections, but infections may come back.  Infections can infect any bone or joint at any age.  Bone and joint infections are rarely fatal.  Bone infection left untreated can become a never-ending infection. It can spread to other areas of your body. It may eventually cause bone death. Reduced limb or joint function can result. In severe cases, this may require removal of a limb. Spinal osteomyelitis is very dangerous. Untreated, it may damage spinal nerves and cause death.  The most common complication of septic arthritis is osteoarthritis with pain and decreased range of motion of the joint. Some forms of treatment may include:  If the infection is caused by bacteria, it is generally treated with antibiotics. You will likely receive the drugs through a vein (intravenously) for anywhere from 2 to 6 weeks. In some cases, especially with children, oral antibiotics following an initial intravenous dose may be effective. The treatment you receive depends on the:  Type of bacteria.  Location of the infection.  Type of surgery that might be done.  Other health conditions or issues you might have.  Your caregiver may drain soft tissue abscesses or pockets of fluid around infected bones or joints. If you have septic arthritis, your caregiver may use a needle to drain pus from the joint on a daily basis. He or she may use an arthroscope to clean the joint or may need to open the joint surgically to remove damaged tissue and infection. An arthroscope is an instrument like a thin lighted telescope. It can be used to look inside the joint.  Surgery is usually needed if the infection has become long-standing. It may also be needed if there is hardware (such as metal plates, screws, or artificial joints) inside the patient. Sometimes a bone or muscle graft is needed to fill in the open space. This promotes growth of new tissues and better blood flow to the area. PREVENTION   Clean and  disinfect wounds quickly to help prevent the start of a bone or joint infection. Get treatment for any infections to prevent spread to a bone or joint.  Do not smoke. Smoking decreases healing rates of bone and predisposes to infection.  When given medications that suppress your immune system, use them according to your caregiver's instructions. Do not take more than prescribed for your condition.  Take good care of your feet and skin, especially if you have diabetes, decreased sensation or circulation problems. SEEK IMMEDIATE MEDICAL CARE IF:   You cannot bear weight on a leg or use an arm, especially following a minor injury. This can be a sign of bone or joint infection.  You think you may have signs or symptoms of a bone or joint infection. Your chance of getting rid of an infection is better if treated early. Document Released: 04/26/2005 Document Revised: 07/19/2011 Document Reviewed: 03/26/2009 [facility] Patient Information 2015 [place], [place]. This information is not intended to replace advice given to you by your health care provider. Make sure you discuss any questions you have with your health care provider.

## 2014-07-07 NOTE — ED Notes (Signed)
POC discussed with pt and aide, pt is in no distress.  Await further orders and will help organize transport back home once pt is discharged.  Pt is not in any distress at this time.  No s/s or infection or distress at this time

## 2014-07-07 NOTE — ED Notes (Signed)
Pt here for evaluation of swelling and discoloration of right pinky.

## 2014-07-07 NOTE — ED Notes (Signed)
No radial pulse felt but i was able to doppler radial pulse with handheld doppler

## 2014-07-08 ENCOUNTER — Encounter: Payer: Self-pay | Admitting: Vascular Surgery

## 2014-07-08 DIAGNOSIS — D631 Anemia in chronic kidney disease: Secondary | ICD-10-CM | POA: Diagnosis not present

## 2014-07-08 DIAGNOSIS — N186 End stage renal disease: Secondary | ICD-10-CM | POA: Diagnosis not present

## 2014-07-08 DIAGNOSIS — Z992 Dependence on renal dialysis: Secondary | ICD-10-CM | POA: Diagnosis not present

## 2014-07-08 DIAGNOSIS — D509 Iron deficiency anemia, unspecified: Secondary | ICD-10-CM | POA: Diagnosis not present

## 2014-07-09 ENCOUNTER — Ambulatory Visit (HOSPITAL_COMMUNITY)
Admission: RE | Admit: 2014-07-09 | Discharge: 2014-07-09 | Disposition: A | Discharge status: Home / Self Care | Payer: Medicare Other | Source: Ambulatory Visit | Attending: Vascular Surgery | Admitting: Vascular Surgery

## 2014-07-09 ENCOUNTER — Ambulatory Visit (INDEPENDENT_AMBULATORY_CARE_PROVIDER_SITE_OTHER): Payer: Medicare Other | Admitting: Vascular Surgery

## 2014-07-09 ENCOUNTER — Other Ambulatory Visit: Payer: Self-pay | Admitting: *Deleted

## 2014-07-09 ENCOUNTER — Encounter: Payer: Self-pay | Admitting: Vascular Surgery

## 2014-07-09 VITALS — BP 123/61 | HR 98 | Ht 70.0 in | Wt 238.0 lb

## 2014-07-09 DIAGNOSIS — Z89611 Acquired absence of right leg above knee: Secondary | ICD-10-CM | POA: Diagnosis not present

## 2014-07-09 DIAGNOSIS — E1165 Type 2 diabetes mellitus with hyperglycemia: Secondary | ICD-10-CM | POA: Diagnosis not present

## 2014-07-09 DIAGNOSIS — M79643 Pain in unspecified hand: Secondary | ICD-10-CM | POA: Diagnosis not present

## 2014-07-09 DIAGNOSIS — I739 Peripheral vascular disease, unspecified: Secondary | ICD-10-CM | POA: Diagnosis not present

## 2014-07-09 DIAGNOSIS — I96 Gangrene, not elsewhere classified: Secondary | ICD-10-CM

## 2014-07-09 DIAGNOSIS — I10 Essential (primary) hypertension: Secondary | ICD-10-CM | POA: Diagnosis not present

## 2014-07-09 DIAGNOSIS — Z89612 Acquired absence of left leg above knee: Secondary | ICD-10-CM | POA: Diagnosis not present
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
## 2014-07-10 DIAGNOSIS — Z992 Dependence on renal dialysis: Secondary | ICD-10-CM | POA: Diagnosis not present

## 2014-07-10 DIAGNOSIS — D631 Anemia in chronic kidney disease: Secondary | ICD-10-CM | POA: Diagnosis not present

## 2014-07-10 DIAGNOSIS — D509 Iron deficiency anemia, unspecified: Secondary | ICD-10-CM | POA: Diagnosis not present

## 2014-07-10 DIAGNOSIS — N186 End stage renal disease: Secondary | ICD-10-CM | POA: Diagnosis not present

## 2014-07-10 NOTE — Addendum Note (Signed)
Addended by: Mena Goes on: 07/10/2014 11:14 AM   Modules accepted: Orders

## 2014-07-11 ENCOUNTER — Other Ambulatory Visit: Payer: Self-pay | Admitting: *Deleted

## 2014-07-11 MED ORDER — ATORVASTATIN CALCIUM 80 MG PO TABS: 80.0000 mg | ORAL_TABLET | Freq: Every evening | ORAL | Status: AC

## 2014-07-11 NOTE — Telephone Encounter (Signed)
Received fax requesting refill on Lipitor.   Refill appropriate and filled per protocol.

## 2014-07-12 DIAGNOSIS — N186 End stage renal disease: Secondary | ICD-10-CM | POA: Diagnosis not present

## 2014-07-12 DIAGNOSIS — D631 Anemia in chronic kidney disease: Secondary | ICD-10-CM | POA: Diagnosis not present

## 2014-07-12 DIAGNOSIS — Z992 Dependence on renal dialysis: Secondary | ICD-10-CM | POA: Diagnosis not present

## 2014-07-12 DIAGNOSIS — D509 Iron deficiency anemia, unspecified: Secondary | ICD-10-CM | POA: Diagnosis not present

## 2014-07-12 LAB — CULTURE, BLOOD (ROUTINE X 2)
CULTURE: NO GROWTH
Culture: NO GROWTH

## 2014-07-15 DIAGNOSIS — Z992 Dependence on renal dialysis: Secondary | ICD-10-CM | POA: Diagnosis not present

## 2014-07-15 DIAGNOSIS — D631 Anemia in chronic kidney disease: Secondary | ICD-10-CM | POA: Diagnosis not present

## 2014-07-15 DIAGNOSIS — N186 End stage renal disease: Secondary | ICD-10-CM | POA: Diagnosis not present

## 2014-07-15 DIAGNOSIS — D509 Iron deficiency anemia, unspecified: Secondary | ICD-10-CM | POA: Diagnosis not present

## 2014-07-16 ENCOUNTER — Encounter: Payer: Self-pay | Admitting: Nurse Practitioner

## 2014-07-16 ENCOUNTER — Ambulatory Visit: Payer: Medicaid Other | Admitting: Nurse Practitioner

## 2014-07-16 ENCOUNTER — Other Ambulatory Visit: Payer: Self-pay | Admitting: Orthopedic Surgery

## 2014-07-16 ENCOUNTER — Telehealth: Payer: Self-pay | Admitting: Internal Medicine

## 2014-07-16 DIAGNOSIS — M86241 Subacute osteomyelitis, right hand: Secondary | ICD-10-CM | POA: Diagnosis not present

## 2014-07-16 NOTE — Telephone Encounter (Signed)
PATIENT WAS A NO SHOW 07/16/14 AND LETTER WAS SENT

## 2014-07-17 DIAGNOSIS — D509 Iron deficiency anemia, unspecified: Secondary | ICD-10-CM | POA: Diagnosis not present

## 2014-07-17 DIAGNOSIS — Z992 Dependence on renal dialysis: Secondary | ICD-10-CM | POA: Diagnosis not present

## 2014-07-17 DIAGNOSIS — N186 End stage renal disease: Secondary | ICD-10-CM | POA: Diagnosis not present

## 2014-07-17 DIAGNOSIS — D631 Anemia in chronic kidney disease: Secondary | ICD-10-CM | POA: Diagnosis not present

## 2014-07-18 ENCOUNTER — Telehealth: Payer: Self-pay | Admitting: Family Medicine

## 2014-07-18 ENCOUNTER — Other Ambulatory Visit: Payer: Self-pay | Admitting: Family Medicine

## 2014-07-18 MED ORDER — OXYCODONE-ACETAMINOPHEN 10-325 MG PO TABS: 1.0000 | ORAL_TABLET | Freq: Two times a day (BID) | ORAL | Status: DC | PRN

## 2014-07-18 NOTE — Telephone Encounter (Signed)
[phone] Patient calling to get refill on his percocet

## 2014-07-18 NOTE — Telephone Encounter (Signed)
Ok to refill??  Last office visit/ refill 06/18/2014.

## 2014-07-18 NOTE — Telephone Encounter (Signed)
ok 

## 2014-07-18 NOTE — Telephone Encounter (Signed)
Ok to refill??  Last office visit 06/18/2014.  Last refill 07/02/2014.

## 2014-07-18 NOTE — Telephone Encounter (Signed)
Prescription printed and patient made aware to come to office to pick up.  

## 2014-07-19 DIAGNOSIS — N186 End stage renal disease: Secondary | ICD-10-CM | POA: Diagnosis not present

## 2014-07-19 DIAGNOSIS — D509 Iron deficiency anemia, unspecified: Secondary | ICD-10-CM | POA: Diagnosis not present

## 2014-07-19 DIAGNOSIS — Z992 Dependence on renal dialysis: Secondary | ICD-10-CM | POA: Diagnosis not present

## 2014-07-19 DIAGNOSIS — D631 Anemia in chronic kidney disease: Secondary | ICD-10-CM | POA: Diagnosis not present

## 2014-07-19 NOTE — Telephone Encounter (Signed)
ok 

## 2014-07-19 NOTE — Telephone Encounter (Signed)
Noted  

## 2014-07-19 NOTE — Telephone Encounter (Signed)
Medication called to pharmacy. 

## 2014-07-22 ENCOUNTER — Encounter (HOSPITAL_BASED_OUTPATIENT_CLINIC_OR_DEPARTMENT_OTHER): Payer: Self-pay | Admitting: *Deleted

## 2014-07-22 ENCOUNTER — Other Ambulatory Visit: Payer: Self-pay | Admitting: Orthopedic Surgery

## 2014-07-22 DIAGNOSIS — D631 Anemia in chronic kidney disease: Secondary | ICD-10-CM | POA: Diagnosis not present

## 2014-07-22 DIAGNOSIS — D509 Iron deficiency anemia, unspecified: Secondary | ICD-10-CM | POA: Diagnosis not present

## 2014-07-22 DIAGNOSIS — Z992 Dependence on renal dialysis: Secondary | ICD-10-CM | POA: Diagnosis not present

## 2014-07-22 DIAGNOSIS — N186 End stage renal disease: Secondary | ICD-10-CM | POA: Diagnosis not present

## 2014-07-22 MED ORDER — CEFAZOLIN SODIUM-DEXTROSE 2-3 GM-% IV SOLR
2.0000 g | INTRAVENOUS | Status: AC
  Administered 2014-07-23: 2 g via INTRAVENOUS
  Filled 2014-07-22: qty 50

## 2014-07-22 MED ORDER — CHLORHEXIDINE GLUCONATE 4 % EX LIQD: 60.0000 mL | Freq: Once | CUTANEOUS | Status: DC

## 2014-07-23 ENCOUNTER — Encounter (HOSPITAL_COMMUNITY): Payer: Self-pay | Admitting: *Deleted

## 2014-07-23 ENCOUNTER — Encounter (HOSPITAL_COMMUNITY)
Admission: RE | Disposition: A | Discharge status: Home / Self Care | Payer: Self-pay | Source: Ambulatory Visit | Attending: Orthopedic Surgery

## 2014-07-23 ENCOUNTER — Ambulatory Visit (HOSPITAL_COMMUNITY): Payer: Medicare Other | Admitting: Anesthesiology

## 2014-07-23 ENCOUNTER — Ambulatory Visit (HOSPITAL_BASED_OUTPATIENT_CLINIC_OR_DEPARTMENT_OTHER)
Admission: RE | Admit: 2014-07-23 | Discharge: 2014-07-23 | Disposition: A | Discharge status: Home / Self Care | Payer: Medicare Other | Source: Ambulatory Visit | Attending: Orthopedic Surgery | Admitting: Orthopedic Surgery

## 2014-07-23 DIAGNOSIS — N186 End stage renal disease: Secondary | ICD-10-CM | POA: Insufficient documentation

## 2014-07-23 DIAGNOSIS — M879 Osteonecrosis, unspecified: Secondary | ICD-10-CM | POA: Diagnosis not present

## 2014-07-23 DIAGNOSIS — Z89611 Acquired absence of right leg above knee: Secondary | ICD-10-CM | POA: Diagnosis not present

## 2014-07-23 DIAGNOSIS — I2510 Atherosclerotic heart disease of native coronary artery without angina pectoris: Secondary | ICD-10-CM | POA: Diagnosis not present

## 2014-07-23 DIAGNOSIS — Z9861 Coronary angioplasty status: Secondary | ICD-10-CM | POA: Insufficient documentation

## 2014-07-23 DIAGNOSIS — G8929 Other chronic pain: Secondary | ICD-10-CM | POA: Insufficient documentation

## 2014-07-23 DIAGNOSIS — E1152 Type 2 diabetes mellitus with diabetic peripheral angiopathy with gangrene: Secondary | ICD-10-CM | POA: Diagnosis not present

## 2014-07-23 DIAGNOSIS — E669 Obesity, unspecified: Secondary | ICD-10-CM | POA: Diagnosis not present

## 2014-07-23 DIAGNOSIS — Z992 Dependence on renal dialysis: Secondary | ICD-10-CM | POA: Diagnosis not present

## 2014-07-23 DIAGNOSIS — I252 Old myocardial infarction: Secondary | ICD-10-CM | POA: Diagnosis not present

## 2014-07-23 DIAGNOSIS — M549 Dorsalgia, unspecified: Secondary | ICD-10-CM | POA: Diagnosis not present

## 2014-07-23 DIAGNOSIS — I120 Hypertensive chronic kidney disease with stage 5 chronic kidney disease or end stage renal disease: Secondary | ICD-10-CM | POA: Diagnosis not present

## 2014-07-23 DIAGNOSIS — Z6834 Body mass index (BMI) 34.0-34.9, adult: Secondary | ICD-10-CM | POA: Diagnosis not present

## 2014-07-23 DIAGNOSIS — I96 Gangrene, not elsewhere classified: Secondary | ICD-10-CM | POA: Diagnosis not present

## 2014-07-23 DIAGNOSIS — M86241 Subacute osteomyelitis, right hand: Secondary | ICD-10-CM | POA: Diagnosis not present

## 2014-07-23 HISTORY — PX: AMPUTATION: SHX166

## 2014-07-23 HISTORY — DX: Anemia, unspecified: D64.9

## 2014-07-23 HISTORY — DX: Acute myocardial infarction, unspecified: I21.9

## 2014-07-23 LAB — BASIC METABOLIC PANEL
ANION GAP: 11 (ref 5–15)
BUN: 21 mg/dL (ref 6–23)
CHLORIDE: 99 mmol/L (ref 96–112)
CO2: 27 mmol/L (ref 19–32)
Calcium: 10.5 mg/dL (ref 8.4–10.5)
Creatinine, Ser: 6.13 mg/dL — ABNORMAL HIGH (ref 0.50–1.35)
GFR calc Af Amer: 10 mL/min — ABNORMAL LOW (ref >90)
GFR calc non Af Amer: 8 mL/min — ABNORMAL LOW (ref >90)
GLUCOSE: 104 mg/dL — AB (ref 70–99)
Potassium: 4.6 mmol/L (ref 3.5–5.1)
SODIUM: 137 mmol/L (ref 135–145)

## 2014-07-23 LAB — CBC
HEMATOCRIT: 39.6 % (ref 39.0–52.0)
HEMOGLOBIN: 12.5 g/dL — AB (ref 13.0–17.0)
MCH: 30.9 pg (ref 26.0–34.0)
MCHC: 31.6 g/dL (ref 30.0–36.0)
MCV: 97.8 fL (ref 78.0–100.0)
PLATELETS: 65 10*3/uL — AB (ref 150–400)
RBC: 4.05 MIL/uL — ABNORMAL LOW (ref 4.22–5.81)
RDW: 17.5 % — ABNORMAL HIGH (ref 11.5–15.5)
WBC: 4.7 10*3/uL (ref 4.0–10.5)

## 2014-07-23 LAB — GLUCOSE, CAPILLARY
Glucose-Capillary: 96 mg/dL (ref 70–99)
Glucose-Capillary: 99 mg/dL (ref 70–99)
Glucose-Capillary: 94 mg/dL (ref 70–99)

## 2014-07-23 SURGERY — AMPUTATION, FOOT, RAY
Anesthesia: General | Site: Hand | Laterality: Right

## 2014-07-23 MED ORDER — BUPIVACAINE-EPINEPHRINE (PF) 0.25% -1:200000 IJ SOLN
INTRAMUSCULAR | Status: AC
  Filled 2014-07-23: qty 30

## 2014-07-23 MED ORDER — FENTANYL CITRATE 0.05 MG/ML IJ SOLN
INTRAMUSCULAR | Status: AC
  Filled 2014-07-23: qty 5

## 2014-07-23 MED ORDER — CHLORHEXIDINE GLUCONATE 4 % EX LIQD
60.0000 mL | Freq: Once | CUTANEOUS | Status: DC
  Filled 2014-07-23: qty 60

## 2014-07-23 MED ORDER — EPHEDRINE SULFATE 50 MG/ML IJ SOLN
INTRAMUSCULAR | Status: DC | PRN
  Administered 2014-07-23 (×3): 10 mg via INTRAVENOUS

## 2014-07-23 MED ORDER — CEFAZOLIN SODIUM-DEXTROSE 2-3 GM-% IV SOLR: 2.0000 g | INTRAVENOUS | Status: DC

## 2014-07-23 MED ORDER — SODIUM CHLORIDE 0.9 % IV SOLN
INTRAVENOUS | Status: DC
  Administered 2014-07-23: 11:00:00 via INTRAVENOUS

## 2014-07-23 MED ORDER — ARTIFICIAL TEARS OP OINT
TOPICAL_OINTMENT | OPHTHALMIC | Status: AC
  Filled 2014-07-23: qty 3.5

## 2014-07-23 MED ORDER — PHENYLEPHRINE HCL 10 MG/ML IJ SOLN
INTRAMUSCULAR | Status: DC | PRN
  Administered 2014-07-23 (×2): 80 ug via INTRAVENOUS

## 2014-07-23 MED ORDER — BUPIVACAINE-EPINEPHRINE 0.25% -1:200000 IJ SOLN
INTRAMUSCULAR | Status: DC | PRN
  Administered 2014-07-23: 10 mL

## 2014-07-23 MED ORDER — LABETALOL HCL 200 MG PO TABS
200.0000 mg | ORAL_TABLET | Freq: Once | ORAL | Status: AC
  Administered 2014-07-23: 200 mg via ORAL
  Filled 2014-07-23: qty 1

## 2014-07-23 MED ORDER — FENTANYL CITRATE 0.05 MG/ML IJ SOLN
INTRAMUSCULAR | Status: DC | PRN
  Administered 2014-07-23 (×2): 50 ug via INTRAVENOUS

## 2014-07-23 MED ORDER — OXYCODONE-ACETAMINOPHEN 5-325 MG PO TABS: 1.0000 | ORAL_TABLET | ORAL | Status: DC | PRN

## 2014-07-23 MED ORDER — ONDANSETRON HCL 4 MG/2ML IJ SOLN
INTRAMUSCULAR | Status: AC
  Filled 2014-07-23: qty 2

## 2014-07-23 MED ORDER — PROPOFOL 10 MG/ML IV BOLUS
INTRAVENOUS | Status: DC | PRN
  Administered 2014-07-23: 150 mg via INTRAVENOUS

## 2014-07-23 MED ORDER — ROCURONIUM BROMIDE 50 MG/5ML IV SOLN
INTRAVENOUS | Status: AC
  Filled 2014-07-23: qty 1

## 2014-07-23 MED ORDER — 0.9 % SODIUM CHLORIDE (POUR BTL) OPTIME
TOPICAL | Status: DC | PRN
  Administered 2014-07-23: 1000 mL

## 2014-07-23 MED ORDER — PROPOFOL 10 MG/ML IV BOLUS
INTRAVENOUS | Status: AC
  Filled 2014-07-23: qty 20

## 2014-07-23 MED ORDER — LIDOCAINE HCL (CARDIAC) 20 MG/ML IV SOLN
INTRAVENOUS | Status: DC | PRN
  Administered 2014-07-23: 50 mg via INTRAVENOUS

## 2014-07-23 SURGICAL SUPPLY — 60 items
BANDAGE ELASTIC 3 VELCRO ST LF (GAUZE/BANDAGES/DRESSINGS) ×6 IMPLANT
BLADE LONG MED 31MMX9MM (MISCELLANEOUS) ×1
BLADE LONG MED 31X9 (MISCELLANEOUS) ×2 IMPLANT
BNDG CMPR 9X4 STRL LF SNTH (GAUZE/BANDAGES/DRESSINGS) ×1
BNDG COHESIVE 1X5 TAN STRL LF (GAUZE/BANDAGES/DRESSINGS) IMPLANT
BNDG CONFORM 2 STRL LF (GAUZE/BANDAGES/DRESSINGS) IMPLANT
BNDG ESMARK 4X9 LF (GAUZE/BANDAGES/DRESSINGS) ×3 IMPLANT
BNDG GAUZE ELAST 4 BULKY (GAUZE/BANDAGES/DRESSINGS) ×3 IMPLANT
CLIP TI WIDE RED SMALL 6 (CLIP) ×6 IMPLANT
CLOSURE WOUND 1/2 X4 (GAUZE/BANDAGES/DRESSINGS)
CORDS BIPOLAR (ELECTRODE) ×3 IMPLANT
COVER SURGICAL LIGHT HANDLE (MISCELLANEOUS) ×3 IMPLANT
CUFF TOURNIQUET SINGLE 18IN (TOURNIQUET CUFF) IMPLANT
CUFF TOURNIQUET SINGLE 24IN (TOURNIQUET CUFF) IMPLANT
DRAPE OEC MINIVIEW 54X84 (DRAPES) IMPLANT
DRAPE SURG 17X23 STRL (DRAPES) ×3 IMPLANT
DRSG PAD ABDOMINAL 8X10 ST (GAUZE/BANDAGES/DRESSINGS) ×3 IMPLANT
DURAPREP 26ML APPLICATOR (WOUND CARE) ×3 IMPLANT
GAUZE SPONGE 2X2 8PLY STRL LF (GAUZE/BANDAGES/DRESSINGS) IMPLANT
GAUZE SPONGE 4X4 12PLY STRL (GAUZE/BANDAGES/DRESSINGS) IMPLANT
GAUZE XEROFORM 1X8 LF (GAUZE/BANDAGES/DRESSINGS) ×3 IMPLANT
GLOVE BIO SURGEON STRL SZ7 (GLOVE) ×3 IMPLANT
GLOVE BIO SURGEON STRL SZ8 (GLOVE) ×3 IMPLANT
GLOVE BIOGEL M STRL SZ7.5 (GLOVE) ×3 IMPLANT
GLOVE BIOGEL PI IND STRL 7.0 (GLOVE) ×2 IMPLANT
GLOVE BIOGEL PI INDICATOR 7.0 (GLOVE) ×4
GLOVE ECLIPSE 7.0 STRL STRAW (GLOVE) ×3 IMPLANT
GLOVE SURG SYN 8.0 (GLOVE) ×3 IMPLANT
GOWN STRL REUS W/ TWL LRG LVL3 (GOWN DISPOSABLE) ×3 IMPLANT
GOWN STRL REUS W/ TWL XL LVL3 (GOWN DISPOSABLE) ×1 IMPLANT
GOWN STRL REUS W/TWL LRG LVL3 (GOWN DISPOSABLE) ×9
GOWN STRL REUS W/TWL XL LVL3 (GOWN DISPOSABLE) ×3
KIT BASIN OR (CUSTOM PROCEDURE TRAY) ×3 IMPLANT
KIT ROOM TURNOVER OR (KITS) ×3 IMPLANT
MANIFOLD NEPTUNE II (INSTRUMENTS) ×3 IMPLANT
NEEDLE HYPO 25GX1X1/2 BEV (NEEDLE) ×3 IMPLANT
NS IRRIG 1000ML POUR BTL (IV SOLUTION) ×3 IMPLANT
PACK ORTHO EXTREMITY (CUSTOM PROCEDURE TRAY) ×3 IMPLANT
PAD ARMBOARD 7.5X6 YLW CONV (MISCELLANEOUS) ×6 IMPLANT
PAD CAST 3X4 CTTN HI CHSV (CAST SUPPLIES) IMPLANT
PADDING CAST ABS 3INX4YD NS (CAST SUPPLIES) ×2
PADDING CAST ABS COTTON 3X4 (CAST SUPPLIES) ×1 IMPLANT
PADDING CAST COTTON 3X4 STRL (CAST SUPPLIES)
SPECIMEN JAR SMALL (MISCELLANEOUS) ×3 IMPLANT
SPLINT PLASTER EXTRA FAST 3X15 (CAST SUPPLIES) ×2
SPLINT PLASTER GYPS XFAST 3X15 (CAST SUPPLIES) ×1 IMPLANT
SPONGE GAUZE 2X2 STER 10/PKG (GAUZE/BANDAGES/DRESSINGS)
SPONGE GAUZE 4X4 12PLY STER LF (GAUZE/BANDAGES/DRESSINGS) ×3 IMPLANT
STRIP CLOSURE SKIN 1/2X4 (GAUZE/BANDAGES/DRESSINGS) IMPLANT
SUCTION FRAZIER TIP 10 FR DISP (SUCTIONS) ×3 IMPLANT
SUT PROLENE 3 0 PS 2 (SUTURE) ×6 IMPLANT
SUT VIC AB 2-0 CT1 27 (SUTURE) ×3
SUT VIC AB 2-0 CT1 TAPERPNT 27 (SUTURE) ×1 IMPLANT
SYR CONTROL 10ML LL (SYRINGE) ×3 IMPLANT
TOWEL OR 17X24 6PK STRL BLUE (TOWEL DISPOSABLE) ×3 IMPLANT
TOWEL OR 17X26 10 PK STRL BLUE (TOWEL DISPOSABLE) ×3 IMPLANT
TUBE CONNECTING 12'X1/4 (SUCTIONS) ×1
TUBE CONNECTING 12X1/4 (SUCTIONS) ×2 IMPLANT
UNDERPAD 30X30 INCONTINENT (UNDERPADS AND DIAPERS) ×3 IMPLANT
WATER STERILE IRR 1000ML POUR (IV SOLUTION) ×3 IMPLANT
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
## 2014-07-23 NOTE — Transfer of Care (Signed)
Immediate Anesthesia Transfer of Care Note  Patient: Julian [name]  Procedure(s) Performed: Procedure(s): RIGHT SMALL FINGER RAY AMPUTATION  (Right)  Patient Location: PACU  Anesthesia Type:General  Level of Consciousness: awake, alert  and oriented  Airway & Oxygen Therapy: Patient Spontanous Breathing and Patient connected to face mask oxygen  Post-op Assessment: Report given to RN, Post -op Vital signs reviewed and stable and Patient moving all extremities X 4  Post vital signs: Reviewed and stable  Last Vitals:  Filed Vitals:   07/23/14 1352  BP: 118/70  Pulse: 74  Temp:   Resp: 8    Complications: No apparent anesthesia complications

## 2014-07-23 NOTE — Anesthesia Preprocedure Evaluation (Addendum)
Anesthesia Evaluation  Patient identified by MRN, date of birth, ID band Patient awake    Reviewed: Allergy & Precautions, H&P , NPO status , Patient's Chart, lab work & pertinent test results  Airway Mallampati: II  TM Distance: >3 FB Neck ROM: Full    Dental  (+) Poor Dentition, Dental Advidsory Given   Pulmonary neg pulmonary ROS,  breath sounds clear to auscultation        Cardiovascular hypertension, + CAD, + Past MI and + Peripheral Vascular Disease + dysrhythmias Rhythm:Regular Rate:Normal     Neuro/Psych    GI/Hepatic Neg liver ROS,   Endo/Other  diabetes  Renal/GU Renal disease     Musculoskeletal   Abdominal   Peds  Hematology  (+) anemia ,   Anesthesia Other Findings   Reproductive/Obstetrics                            Anesthesia Physical Anesthesia Plan  ASA: III  Anesthesia Plan: General   Post-op Pain Management:    Induction: Intravenous  Airway Management Planned: LMA  Additional Equipment:   Intra-op Plan:   Post-operative Plan: Extubation in OR  Informed Consent: I have reviewed the patients History and Physical, chart, labs and discussed the procedure including the risks, benefits and alternatives for the proposed anesthesia with the patient or authorized representative who has indicated his/her understanding and acceptance.   Dental advisory given and Dental Advisory Given  Plan Discussed with: CRNA and Anesthesiologist  Anesthesia Plan Comments:        Anesthesia Quick Evaluation

## 2014-07-23 NOTE — Op Note (Signed)
See note [phone]

## 2014-07-23 NOTE — Discharge Instructions (Signed)
°What to eat: ° °For your first meals, you should eat lightly; only small meals initially.  If you do not have nausea, you may eat larger meals.  Avoid spicy, greasy and heavy food.   ° °General Anesthesia, Adult, Care After  °Refer to this sheet in the next few weeks. These instructions provide you with information on caring for yourself after your procedure. Your health care provider may also give you more specific instructions. Your treatment has been planned according to current medical practices, but problems sometimes occur. Call your health care provider if you have any problems or questions after your procedure.  °WHAT TO EXPECT AFTER THE PROCEDURE  °After the procedure, it is typical to experience:  °Sleepiness.  °Nausea and vomiting. °HOME CARE INSTRUCTIONS  °For the first 24 hours after general anesthesia:  °Have a responsible person with you.  °Do not drive a car. If you are alone, do not take public transportation.  °Do not drink alcohol.  °Do not take medicine that has not been prescribed by your health care provider.  °Do not sign important papers or make important decisions.  °You may resume a normal diet and activities as directed by your health care provider.  °Change bandages (dressings) as directed.  °If you have questions or problems that seem related to general anesthesia, call the hospital and ask for the anesthetist or anesthesiologist on call. °SEEK MEDICAL CARE IF:  °You have nausea and vomiting that continue the day after anesthesia.  °You develop a rash. °SEEK IMMEDIATE MEDICAL CARE IF:  °You have difficulty breathing.  °You have chest pain.  °You have any allergic problems. °Document Released: 08/02/2000 Document Revised: 12/27/2012 Document Reviewed: 11/09/2012  °ExitCare® Patient Information ©2014 [employer].  ° °Sore Throat  ° ° °A sore throat is a painful, burning, sore, or scratchy feeling of the throat. There may be pain or tenderness when swallowing or talking. You may have  other symptoms with a sore throat. These include coughing, sneezing, fever, or a swollen neck. A sore throat is often the first sign of another sickness. These sicknesses may include a cold, flu, strep throat, or an infection called mono. Most sore throats go away without medical treatment.  °HOME CARE  °Only take medicine as told by your doctor.  °Drink enough fluids to keep your pee (urine) clear or pale yellow.  °Rest as needed.  °Try using throat sprays, lozenges, or suck on hard candy (if older than 4 years or as told).  °Sip warm liquids, such as broth, herbal tea, or warm water with honey. Try sucking on frozen ice pops or drinking cold liquids.  °Rinse the mouth (gargle) with salt water. Mix 1 teaspoon salt with 8 ounces of water.  °Do not smoke. Avoid being around others when they are smoking.  °Put a humidifier in your bedroom at night to moisten the air. You can also turn on a hot shower and sit in the bathroom for 5-10 minutes. Be sure the bathroom door is closed. °GET HELP RIGHT AWAY IF:  °You have trouble breathing.  °You cannot swallow fluids, soft foods, or your spit (saliva).  °You have more puffiness (swelling) in the throat.  °Your sore throat does not get better in 7 days.  °You feel sick to your stomach (nauseous) and throw up (vomit).  °You have a fever or lasting symptoms for more than 2-3 days.  °You have a fever and your symptoms suddenly get worse. °MAKE SURE YOU:  °Understand these   instructions.  °Will watch your condition.  °Will get help right away if you are not doing well or get worse. °Document Released: 02/03/2008 Document Revised: 01/19/2012 Document Reviewed: 01/02/2012  °ExitCare® Patient Information ©2015 [employer]. This information is not intended to replace advice given to you by your health care provider. Make sure you discuss any questions you have with your health care provider.  ° ° ° °

## 2014-07-23 NOTE — Anesthesia Postprocedure Evaluation (Signed)
  Anesthesia Post-op Note  Patient: Julian [name]  Procedure(s) Performed: Procedure(s): RIGHT SMALL FINGER RAY AMPUTATION  (Right)  Patient Location: PACU  Anesthesia Type:General  Level of Consciousness: awake  Airway and Oxygen Therapy: Patient Spontanous Breathing  Post-op Pain: mild  Post-op Assessment: Post-op Vital signs reviewed  Post-op Vital Signs: Reviewed  Last Vitals:  Filed Vitals:   07/23/14 1415  BP:   Pulse: 71  Temp:   Resp: 12    Complications: No apparent anesthesia complications

## 2014-07-23 NOTE — Anesthesia Procedure Notes (Signed)
Procedure Name: LMA Insertion Date/Time: 07/23/2014 12:45 PM Performed by: Neldon Newport Pre-anesthesia Checklist: Patient being monitored, Suction available, Emergency Drugs available, Patient identified and Timeout performed Patient Re-evaluated:Patient Re-evaluated prior to inductionOxygen Delivery Method: Circle system utilized Preoxygenation: Pre-oxygenation with 100% oxygen Intubation Type: IV induction Ventilation: Mask ventilation without difficulty LMA: LMA inserted LMA Size: 4.0 Number of attempts: 1 Placement Confirmation: positive ETCO2 and breath sounds checked- equal and bilateral Tube secured with: Tape Dental Injury: Teeth and Oropharynx as per pre-operative assessment

## 2014-07-24 ENCOUNTER — Encounter (HOSPITAL_COMMUNITY): Payer: Self-pay | Admitting: Orthopedic Surgery

## 2014-07-24 DIAGNOSIS — D509 Iron deficiency anemia, unspecified: Secondary | ICD-10-CM | POA: Diagnosis not present

## 2014-07-24 DIAGNOSIS — D631 Anemia in chronic kidney disease: Secondary | ICD-10-CM | POA: Diagnosis not present

## 2014-07-24 DIAGNOSIS — N186 End stage renal disease: Secondary | ICD-10-CM | POA: Diagnosis not present

## 2014-07-24 DIAGNOSIS — Z992 Dependence on renal dialysis: Secondary | ICD-10-CM | POA: Diagnosis not present

## 2014-07-24 NOTE — Op Note (Signed)
NAMEBENECIO, KLUGER NO.:  [id number]  MEDICAL RECORD NO.:  [id number]  LOCATION:                                 FACILITY:  PHYSICIAN:  Sheral Apley. 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  PROCEDURE:  Left small finger ray amputation.  SURGEON:  Sheral Apley. Burney Gauze, M.D.  ASSISTANT:  Julian Reil, P.A.  ANESTHESIA:  General.  TOURNIQUET TIME:  33 minutes.  COMPLICATIONS:  No complication.  DRAINS:  No drains.  DESCRIPTION OF PROCEDURE:  The patient was taken to the operating suite. After induction of general anesthetic, the right upper extremities prepped and draped in usual sterile fashion.  An Esmarch was used to exsanguinate the limb and a forearm tourniquet was inflated to 250 mmHg. At this point in time, we incised dorsally over the shaft of the small metacarpal and went up into the webspace in a V fashion across to the outside going volarly in V fashion to the A1 pulley area.  Skin was incised sharply, and dorsally we identified the EDC and EDQ to the small finger.  We transected and drew them distally.  We subperiosteally dissected the metacarpal.  Using the oscillating saw, we removed the distal aspect,  cutting it at the metaphyseal-diaphyseal flare and keeping the base intact at the [facility] joint area.  We then carefully dissected dorsally until the extensor tendon was drawn to the level of metacarpophalangeal joint.  We next flipped the hand in full supination. We identified the neurovascular bundles.  We carefully cut the nerves and allowed to retract into the proximal wound.  We used hemoclips to tie off the digital vessels to the small finger.  We then transected  to the volar capsule and the flexor tendons and then proximal phalanx and metacarpal were carefully removed.  The wound was thoroughly irrigated, and we closed the periosteal tube with 2-0 Vicryl, burying the nerves, and then closed the skin with a 3-0 Prolene in a combination of simple and horizontal mattress sutures.  Xeroform, 4x4s, and an ulnar gutter splint was applied.  The patient tolerated the procedure well, went to the recovery room in stable fashion.     Sheral Apley Burney Gauze, M.D.     MAW/MEDQ  D:  07/23/2014  T:  07/24/2014  Job:  [id number]

## 2014-07-25 DIAGNOSIS — N19 Unspecified kidney failure: Secondary | ICD-10-CM | POA: Diagnosis not present

## 2014-07-25 DIAGNOSIS — Z992 Dependence on renal dialysis: Secondary | ICD-10-CM | POA: Diagnosis not present

## 2014-07-25 DIAGNOSIS — L03119 Cellulitis of unspecified part of limb: Secondary | ICD-10-CM | POA: Diagnosis not present

## 2014-07-26 DIAGNOSIS — D631 Anemia in chronic kidney disease: Secondary | ICD-10-CM | POA: Diagnosis not present

## 2014-07-26 DIAGNOSIS — N186 End stage renal disease: Secondary | ICD-10-CM | POA: Diagnosis not present

## 2014-07-26 DIAGNOSIS — Z992 Dependence on renal dialysis: Secondary | ICD-10-CM | POA: Diagnosis not present

## 2014-07-26 DIAGNOSIS — D509 Iron deficiency anemia, unspecified: Secondary | ICD-10-CM | POA: Diagnosis not present

## 2014-07-29 DIAGNOSIS — Z992 Dependence on renal dialysis: Secondary | ICD-10-CM | POA: Diagnosis not present

## 2014-07-29 DIAGNOSIS — N186 End stage renal disease: Secondary | ICD-10-CM | POA: Diagnosis not present

## 2014-07-29 DIAGNOSIS — D631 Anemia in chronic kidney disease: Secondary | ICD-10-CM | POA: Diagnosis not present

## 2014-07-29 DIAGNOSIS — D509 Iron deficiency anemia, unspecified: Secondary | ICD-10-CM | POA: Diagnosis not present

## 2014-07-30 DIAGNOSIS — M86241 Subacute osteomyelitis, right hand: Secondary | ICD-10-CM | POA: Diagnosis not present

## 2014-07-31 DIAGNOSIS — D509 Iron deficiency anemia, unspecified: Secondary | ICD-10-CM | POA: Diagnosis not present

## 2014-07-31 DIAGNOSIS — N186 End stage renal disease: Secondary | ICD-10-CM | POA: Diagnosis not present

## 2014-07-31 DIAGNOSIS — Z992 Dependence on renal dialysis: Secondary | ICD-10-CM | POA: Diagnosis not present

## 2014-07-31 DIAGNOSIS — D631 Anemia in chronic kidney disease: Secondary | ICD-10-CM | POA: Diagnosis not present

## 2014-08-02 DIAGNOSIS — N186 End stage renal disease: Secondary | ICD-10-CM | POA: Diagnosis not present

## 2014-08-02 DIAGNOSIS — Z992 Dependence on renal dialysis: Secondary | ICD-10-CM | POA: Diagnosis not present

## 2014-08-02 DIAGNOSIS — D631 Anemia in chronic kidney disease: Secondary | ICD-10-CM | POA: Diagnosis not present

## 2014-08-02 DIAGNOSIS — D509 Iron deficiency anemia, unspecified: Secondary | ICD-10-CM | POA: Diagnosis not present

## 2014-08-05 DIAGNOSIS — D631 Anemia in chronic kidney disease: Secondary | ICD-10-CM | POA: Diagnosis not present

## 2014-08-05 DIAGNOSIS — D509 Iron deficiency anemia, unspecified: Secondary | ICD-10-CM | POA: Diagnosis not present

## 2014-08-05 DIAGNOSIS — Z992 Dependence on renal dialysis: Secondary | ICD-10-CM | POA: Diagnosis not present

## 2014-08-05 DIAGNOSIS — N186 End stage renal disease: Secondary | ICD-10-CM | POA: Diagnosis not present

## 2014-08-07 DIAGNOSIS — N186 End stage renal disease: Secondary | ICD-10-CM | POA: Diagnosis not present

## 2014-08-07 DIAGNOSIS — D509 Iron deficiency anemia, unspecified: Secondary | ICD-10-CM | POA: Diagnosis not present

## 2014-08-07 DIAGNOSIS — D631 Anemia in chronic kidney disease: Secondary | ICD-10-CM | POA: Diagnosis not present

## 2014-08-07 DIAGNOSIS — Z992 Dependence on renal dialysis: Secondary | ICD-10-CM | POA: Diagnosis not present

## 2014-08-08 DIAGNOSIS — N186 End stage renal disease: Secondary | ICD-10-CM | POA: Diagnosis not present

## 2014-08-08 DIAGNOSIS — Z992 Dependence on renal dialysis: Secondary | ICD-10-CM | POA: Diagnosis not present

## 2014-08-09 DIAGNOSIS — D509 Iron deficiency anemia, unspecified: Secondary | ICD-10-CM | POA: Diagnosis not present

## 2014-08-09 DIAGNOSIS — D631 Anemia in chronic kidney disease: Secondary | ICD-10-CM | POA: Diagnosis not present

## 2014-08-09 DIAGNOSIS — Z992 Dependence on renal dialysis: Secondary | ICD-10-CM | POA: Diagnosis not present

## 2014-08-09 DIAGNOSIS — N186 End stage renal disease: Secondary | ICD-10-CM | POA: Diagnosis not present

## 2014-08-12 DIAGNOSIS — N186 End stage renal disease: Secondary | ICD-10-CM | POA: Diagnosis not present

## 2014-08-12 DIAGNOSIS — Z992 Dependence on renal dialysis: Secondary | ICD-10-CM | POA: Diagnosis not present

## 2014-08-12 DIAGNOSIS — D509 Iron deficiency anemia, unspecified: Secondary | ICD-10-CM | POA: Diagnosis not present

## 2014-08-12 DIAGNOSIS — D631 Anemia in chronic kidney disease: Secondary | ICD-10-CM | POA: Diagnosis not present

## 2014-08-13 ENCOUNTER — Telehealth: Payer: Self-pay | Admitting: *Deleted

## 2014-08-13 ENCOUNTER — Inpatient Hospital Stay: Payer: Medicare Other | Admitting: Family Medicine

## 2014-08-13 NOTE — Telephone Encounter (Signed)
Received call from patient.   Reports that he will have to have aide call back later to re-schedule his appointment. States that transportation advised that he does not have an appointment with them today.

## 2014-08-14 DIAGNOSIS — N186 End stage renal disease: Secondary | ICD-10-CM | POA: Diagnosis not present

## 2014-08-14 DIAGNOSIS — Z992 Dependence on renal dialysis: Secondary | ICD-10-CM | POA: Diagnosis not present

## 2014-08-14 DIAGNOSIS — D631 Anemia in chronic kidney disease: Secondary | ICD-10-CM | POA: Diagnosis not present

## 2014-08-14 DIAGNOSIS — D509 Iron deficiency anemia, unspecified: Secondary | ICD-10-CM | POA: Diagnosis not present

## 2014-08-16 DIAGNOSIS — D631 Anemia in chronic kidney disease: Secondary | ICD-10-CM | POA: Diagnosis not present

## 2014-08-16 DIAGNOSIS — Z992 Dependence on renal dialysis: Secondary | ICD-10-CM | POA: Diagnosis not present

## 2014-08-16 DIAGNOSIS — N186 End stage renal disease: Secondary | ICD-10-CM | POA: Diagnosis not present

## 2014-08-16 DIAGNOSIS — D509 Iron deficiency anemia, unspecified: Secondary | ICD-10-CM | POA: Diagnosis not present

## 2014-08-19 ENCOUNTER — Telehealth: Payer: Self-pay | Admitting: Family Medicine

## 2014-08-19 DIAGNOSIS — Z992 Dependence on renal dialysis: Secondary | ICD-10-CM | POA: Diagnosis not present

## 2014-08-19 DIAGNOSIS — D631 Anemia in chronic kidney disease: Secondary | ICD-10-CM | POA: Diagnosis not present

## 2014-08-19 DIAGNOSIS — E119 Type 2 diabetes mellitus without complications: Secondary | ICD-10-CM | POA: Diagnosis not present

## 2014-08-19 DIAGNOSIS — D509 Iron deficiency anemia, unspecified: Secondary | ICD-10-CM | POA: Diagnosis not present

## 2014-08-19 DIAGNOSIS — N186 End stage renal disease: Secondary | ICD-10-CM | POA: Diagnosis not present

## 2014-08-19 NOTE — Telephone Encounter (Signed)
Ok to refill Percocet??  Last office visit 06/18/2014.  Last refill 07/18/2014.

## 2014-08-19 NOTE — Telephone Encounter (Signed)
Patient needs rx for his percocet and also the diphenoxylate if possible  [place] and when percocet ready to pick up please call him at [phone]

## 2014-08-19 NOTE — Telephone Encounter (Signed)
Ok to refill Lomotil? 

## 2014-08-20 DIAGNOSIS — N19 Unspecified kidney failure: Secondary | ICD-10-CM | POA: Diagnosis not present

## 2014-08-20 DIAGNOSIS — Z89619 Acquired absence of unspecified leg above knee: Secondary | ICD-10-CM | POA: Diagnosis not present

## 2014-08-20 DIAGNOSIS — Z992 Dependence on renal dialysis: Secondary | ICD-10-CM | POA: Diagnosis not present

## 2014-08-20 MED ORDER — DIPHENOXYLATE-ATROPINE 2.5-0.025 MG PO TABS: ORAL_TABLET | ORAL | Status: AC

## 2014-08-20 MED ORDER — OXYCODONE-ACETAMINOPHEN 10-325 MG PO TABS: 1.0000 | ORAL_TABLET | Freq: Two times a day (BID) | ORAL | Status: DC | PRN

## 2014-08-20 NOTE — Telephone Encounter (Signed)
Prescription printed and patient made aware to come to office to pick up.  

## 2014-08-20 NOTE — Telephone Encounter (Signed)
Okay to refill both meds

## 2014-08-21 DIAGNOSIS — N186 End stage renal disease: Secondary | ICD-10-CM | POA: Diagnosis not present

## 2014-08-21 DIAGNOSIS — D509 Iron deficiency anemia, unspecified: Secondary | ICD-10-CM | POA: Diagnosis not present

## 2014-08-21 DIAGNOSIS — D631 Anemia in chronic kidney disease: Secondary | ICD-10-CM | POA: Diagnosis not present

## 2014-08-21 DIAGNOSIS — Z992 Dependence on renal dialysis: Secondary | ICD-10-CM | POA: Diagnosis not present

## 2014-08-23 DIAGNOSIS — D631 Anemia in chronic kidney disease: Secondary | ICD-10-CM | POA: Diagnosis not present

## 2014-08-23 DIAGNOSIS — N186 End stage renal disease: Secondary | ICD-10-CM | POA: Diagnosis not present

## 2014-08-23 DIAGNOSIS — D509 Iron deficiency anemia, unspecified: Secondary | ICD-10-CM | POA: Diagnosis not present

## 2014-08-23 DIAGNOSIS — Z992 Dependence on renal dialysis: Secondary | ICD-10-CM | POA: Diagnosis not present

## 2014-08-25 DIAGNOSIS — N19 Unspecified kidney failure: Secondary | ICD-10-CM | POA: Diagnosis not present

## 2014-08-25 DIAGNOSIS — Z992 Dependence on renal dialysis: Secondary | ICD-10-CM | POA: Diagnosis not present

## 2014-08-25 DIAGNOSIS — L03119 Cellulitis of unspecified part of limb: Secondary | ICD-10-CM | POA: Diagnosis not present

## 2014-08-26 DIAGNOSIS — D509 Iron deficiency anemia, unspecified: Secondary | ICD-10-CM | POA: Diagnosis not present

## 2014-08-26 DIAGNOSIS — D631 Anemia in chronic kidney disease: Secondary | ICD-10-CM | POA: Diagnosis not present

## 2014-08-26 DIAGNOSIS — N186 End stage renal disease: Secondary | ICD-10-CM | POA: Diagnosis not present

## 2014-08-26 DIAGNOSIS — Z992 Dependence on renal dialysis: Secondary | ICD-10-CM | POA: Diagnosis not present

## 2014-08-27 ENCOUNTER — Ambulatory Visit (INDEPENDENT_AMBULATORY_CARE_PROVIDER_SITE_OTHER): Payer: Medicare Other | Admitting: Family Medicine

## 2014-08-27 ENCOUNTER — Encounter: Payer: Self-pay | Admitting: Family Medicine

## 2014-08-27 VITALS — BP 122/76 | HR 78 | Temp 97.8°F | Resp 18

## 2014-08-27 DIAGNOSIS — R197 Diarrhea, unspecified: Secondary | ICD-10-CM | POA: Diagnosis not present

## 2014-08-27 DIAGNOSIS — I739 Peripheral vascular disease, unspecified: Secondary | ICD-10-CM

## 2014-08-27 DIAGNOSIS — N186 End stage renal disease: Secondary | ICD-10-CM

## 2014-08-27 DIAGNOSIS — Z992 Dependence on renal dialysis: Secondary | ICD-10-CM | POA: Diagnosis not present

## 2014-08-27 DIAGNOSIS — I10 Essential (primary) hypertension: Secondary | ICD-10-CM

## 2014-08-27 DIAGNOSIS — E118 Type 2 diabetes mellitus with unspecified complications: Secondary | ICD-10-CM

## 2014-08-27 NOTE — Patient Instructions (Signed)
Continue current medications Blood pressure looks good Continue pain medications F/U 4 months

## 2014-08-27 NOTE — Progress Notes (Signed)
Patient ID: Julian [name], male   DOB: 12-28-1945, 69 y.o.   MRN: [id number]   Subjective:    Patient ID: Julian [name], male    DOB: 04/16/1946, 69 y.o.   MRN: [id number]  Patient presents for Hospital F/U  patient here for hospital follow-up. 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 He had an ulceration that would not heal. He states that overall he has been doing well. He is doing some exercises with his hand given to him by the hand surgeon. His diabetes has been well-controlled his A1c was at 6.6% off of any insulin. His pain is controlled with the Percocet I am prescribing. He has no new concerns today. He is still in dialysis  He also requests refill on his Lomotil which he uses for his chronic diarrhea   Review Of Systems:  GEN- denies fatigue, fever, weight loss,weakness, recent illness HEENT- denies eye drainage, change in vision, nasal discharge, CVS- denies chest pain, palpitations RESP- denies SOB, cough, wheeze ABD- denies N/V, change in stools, abd pain GU- denies dysuria, hematuria, dribbling, incontinence MSK- denies joint pain, muscle aches, injury Neuro- denies headache, dizziness, syncope, seizure activity       Objective:    BP 122/76 mmHg  Pulse 78  Temp(Src) 97.8 F (36.6 C) (Oral)  Resp 18 GEN- NAD, alert and oriented x3 HEENT- PERRL, EOMI, non injected sclera, pink conjunctiva, MMM, oropharynx clear CVS- RRR, no murmur RESP-CTAB EXT- bilat AKA, bandage of right hand, 5th digit missing Pulses- Radial 2+        Assessment & Plan:      Problem List Items Addressed This Visit    Type 2 diabetes with complication   PVD (peripheral vascular disease)   Essential hypertension, benign   End-stage renal disease on hemodialysis - Primary   Diarrhea      Note: This dictation was prepared with Dragon dictation along with smaller phrase technology. Any transcriptional errors that result  from this process are unintentional.

## 2014-08-27 NOTE — Assessment & Plan Note (Signed)
Diabetes is well controlled off medications. Discussed with him the importance with his peripheral vascular disease any injuries infections need to be looked at and immediately as he is now status post bilateral AKA as well as now he is missing a digit.

## 2014-08-27 NOTE — Assessment & Plan Note (Signed)
Chronic diarrhea and bowel problems. I will refill his Lomotil he has done well with this

## 2014-08-27 NOTE — Assessment & Plan Note (Signed)
Blood pressure well-controlled today. 

## 2014-08-28 DIAGNOSIS — D509 Iron deficiency anemia, unspecified: Secondary | ICD-10-CM | POA: Diagnosis not present

## 2014-08-28 DIAGNOSIS — D631 Anemia in chronic kidney disease: Secondary | ICD-10-CM | POA: Diagnosis not present

## 2014-08-28 DIAGNOSIS — Z992 Dependence on renal dialysis: Secondary | ICD-10-CM | POA: Diagnosis not present

## 2014-08-28 DIAGNOSIS — N186 End stage renal disease: Secondary | ICD-10-CM | POA: Diagnosis not present

## 2014-08-30 DIAGNOSIS — Z992 Dependence on renal dialysis: Secondary | ICD-10-CM | POA: Diagnosis not present

## 2014-08-30 DIAGNOSIS — D509 Iron deficiency anemia, unspecified: Secondary | ICD-10-CM | POA: Diagnosis not present

## 2014-08-30 DIAGNOSIS — D631 Anemia in chronic kidney disease: Secondary | ICD-10-CM | POA: Diagnosis not present

## 2014-08-30 DIAGNOSIS — N186 End stage renal disease: Secondary | ICD-10-CM | POA: Diagnosis not present

## 2014-09-01 NOTE — Op Note (Signed)
PATIENT NAME:  Julian [name], Julian [name] MR#:  [id number] DATE OF BIRTH:  [birth year]  DATE OF PROCEDURE:  06/28/2011  PREOPERATIVE DIAGNOSES:  1. End-stage renal disease.  2. Nonfunctional right jugular PermCath.  3. Morbid obesity.  4. Left arm AV fistula not consistently usable for dialysis.  POSTOPERATIVE DIAGNOSES:  1. End-stage renal disease.  2. Nonfunctional right jugular PermCath.  3. Morbid obesity.  4. Left arm AV fistula not consistently usable for dialysis.  PROCEDURE: Continuous TPA infusion to right jugular PermCath.   SURGEON: Algernon Huxley, M.D.   ANESTHESIA: None.   ESTIMATED BLOOD LOSS: None.   INDICATION FOR PROCEDURE: This is a 69 year old African American male with a nonfunctional PermCath. He is brought in for TPA infusion to try to clear the line.   DESCRIPTION OF PROCEDURE: The patient is brought in and over a two-hour period of continuous infusion of 2 mg TPA was infused through both lumens of the PermCath. Following this continuous infusion both lumens withdrew blood well and flushed easily with sterile saline and then a concentrated heparin solution was placed. New sterile caps were placed. The patient tolerated the procedure well.  ____________________________ Algernon Huxley, MD jsd:slb D: 06/29/2011 14:53:37 ET      T: 06/29/2011 15:45:59 ET   JOB#: [id number] Algernon Huxley MD ELECTRONICALLY SIGNED 07/02/2011 13:24

## 2014-09-02 DIAGNOSIS — N186 End stage renal disease: Secondary | ICD-10-CM | POA: Diagnosis not present

## 2014-09-02 DIAGNOSIS — D509 Iron deficiency anemia, unspecified: Secondary | ICD-10-CM | POA: Diagnosis not present

## 2014-09-02 DIAGNOSIS — E1165 Type 2 diabetes mellitus with hyperglycemia: Secondary | ICD-10-CM | POA: Diagnosis not present

## 2014-09-02 DIAGNOSIS — D631 Anemia in chronic kidney disease: Secondary | ICD-10-CM | POA: Diagnosis not present

## 2014-09-02 DIAGNOSIS — Z992 Dependence on renal dialysis: Secondary | ICD-10-CM | POA: Diagnosis not present

## 2014-09-04 DIAGNOSIS — D631 Anemia in chronic kidney disease: Secondary | ICD-10-CM | POA: Diagnosis not present

## 2014-09-04 DIAGNOSIS — Z992 Dependence on renal dialysis: Secondary | ICD-10-CM | POA: Diagnosis not present

## 2014-09-04 DIAGNOSIS — D509 Iron deficiency anemia, unspecified: Secondary | ICD-10-CM | POA: Diagnosis not present

## 2014-09-04 DIAGNOSIS — N186 End stage renal disease: Secondary | ICD-10-CM | POA: Diagnosis not present

## 2014-09-05 ENCOUNTER — Telehealth: Payer: Self-pay | Admitting: *Deleted

## 2014-09-05 NOTE — Telephone Encounter (Signed)
Received call from patient.   Requesting records from nursing home and surgery on hand. States that he had fall in nursing home and that is what caused issue with hand to require surgical removal of finger.   Advised that written request will be required for [employer] for any medical records. States that he will notify lawyer in AM to have request sent to office.   MD to be made aware.

## 2014-09-06 DIAGNOSIS — D509 Iron deficiency anemia, unspecified: Secondary | ICD-10-CM | POA: Diagnosis not present

## 2014-09-06 DIAGNOSIS — D631 Anemia in chronic kidney disease: Secondary | ICD-10-CM | POA: Diagnosis not present

## 2014-09-06 DIAGNOSIS — Z992 Dependence on renal dialysis: Secondary | ICD-10-CM | POA: Diagnosis not present

## 2014-09-06 DIAGNOSIS — N186 End stage renal disease: Secondary | ICD-10-CM | POA: Diagnosis not present

## 2014-09-07 DIAGNOSIS — N186 End stage renal disease: Secondary | ICD-10-CM | POA: Diagnosis not present

## 2014-09-07 DIAGNOSIS — Z992 Dependence on renal dialysis: Secondary | ICD-10-CM | POA: Diagnosis not present

## 2014-09-09 ENCOUNTER — Encounter: Payer: Self-pay | Admitting: Vascular Surgery

## 2014-09-09 DIAGNOSIS — D509 Iron deficiency anemia, unspecified: Secondary | ICD-10-CM | POA: Diagnosis not present

## 2014-09-09 DIAGNOSIS — N186 End stage renal disease: Secondary | ICD-10-CM | POA: Diagnosis not present

## 2014-09-09 DIAGNOSIS — Z992 Dependence on renal dialysis: Secondary | ICD-10-CM | POA: Diagnosis not present

## 2014-09-09 DIAGNOSIS — D631 Anemia in chronic kidney disease: Secondary | ICD-10-CM | POA: Diagnosis not present

## 2014-09-10 ENCOUNTER — Ambulatory Visit: Payer: Medicare Other | Admitting: Vascular Surgery

## 2014-09-10 ENCOUNTER — Inpatient Hospital Stay (HOSPITAL_COMMUNITY): Admission: RE | Admit: 2014-09-10 | Payer: Medicare Other | Source: Ambulatory Visit

## 2014-09-10 DIAGNOSIS — S61401D Unspecified open wound of right hand, subsequent encounter: Secondary | ICD-10-CM | POA: Diagnosis not present

## 2014-09-10 DIAGNOSIS — M86241 Subacute osteomyelitis, right hand: Secondary | ICD-10-CM | POA: Diagnosis not present

## 2014-09-11 ENCOUNTER — Ambulatory Visit (HOSPITAL_COMMUNITY): Payer: Medicare Other | Attending: Orthopedic Surgery | Admitting: Physical Therapy

## 2014-09-11 DIAGNOSIS — Z992 Dependence on renal dialysis: Secondary | ICD-10-CM | POA: Diagnosis not present

## 2014-09-11 DIAGNOSIS — D631 Anemia in chronic kidney disease: Secondary | ICD-10-CM | POA: Diagnosis not present

## 2014-09-11 DIAGNOSIS — D509 Iron deficiency anemia, unspecified: Secondary | ICD-10-CM | POA: Diagnosis not present

## 2014-09-11 DIAGNOSIS — N186 End stage renal disease: Secondary | ICD-10-CM | POA: Diagnosis not present

## 2014-09-13 DIAGNOSIS — D509 Iron deficiency anemia, unspecified: Secondary | ICD-10-CM | POA: Diagnosis not present

## 2014-09-13 DIAGNOSIS — D631 Anemia in chronic kidney disease: Secondary | ICD-10-CM | POA: Diagnosis not present

## 2014-09-13 DIAGNOSIS — Z992 Dependence on renal dialysis: Secondary | ICD-10-CM | POA: Diagnosis not present

## 2014-09-13 DIAGNOSIS — N186 End stage renal disease: Secondary | ICD-10-CM | POA: Diagnosis not present

## 2014-09-15 ENCOUNTER — Emergency Department (HOSPITAL_COMMUNITY)
Admission: EM | Admit: 2014-09-15 | Discharge: 2014-09-15 | Disposition: A | Discharge status: Home / Self Care | Payer: Medicare Other | Attending: Emergency Medicine | Admitting: Emergency Medicine

## 2014-09-15 DIAGNOSIS — E11319 Type 2 diabetes mellitus with unspecified diabetic retinopathy without macular edema: Secondary | ICD-10-CM | POA: Insufficient documentation

## 2014-09-15 DIAGNOSIS — Z743 Need for continuous supervision: Secondary | ICD-10-CM | POA: Diagnosis not present

## 2014-09-15 DIAGNOSIS — Z8619 Personal history of other infectious and parasitic diseases: Secondary | ICD-10-CM | POA: Diagnosis not present

## 2014-09-15 DIAGNOSIS — H5462 Unqualified visual loss, left eye, normal vision right eye: Secondary | ICD-10-CM | POA: Diagnosis not present

## 2014-09-15 DIAGNOSIS — Z79899 Other long term (current) drug therapy: Secondary | ICD-10-CM | POA: Insufficient documentation

## 2014-09-15 DIAGNOSIS — E669 Obesity, unspecified: Secondary | ICD-10-CM | POA: Diagnosis not present

## 2014-09-15 DIAGNOSIS — Z7982 Long term (current) use of aspirin: Secondary | ICD-10-CM | POA: Insufficient documentation

## 2014-09-15 DIAGNOSIS — N186 End stage renal disease: Secondary | ICD-10-CM | POA: Diagnosis not present

## 2014-09-15 DIAGNOSIS — Z862 Personal history of diseases of the blood and blood-forming organs and certain disorders involving the immune mechanism: Secondary | ICD-10-CM | POA: Diagnosis not present

## 2014-09-15 DIAGNOSIS — I252 Old myocardial infarction: Secondary | ICD-10-CM | POA: Diagnosis not present

## 2014-09-15 DIAGNOSIS — R279 Unspecified lack of coordination: Secondary | ICD-10-CM | POA: Diagnosis not present

## 2014-09-15 DIAGNOSIS — I120 Hypertensive chronic kidney disease with stage 5 chronic kidney disease or end stage renal disease: Secondary | ICD-10-CM | POA: Diagnosis not present

## 2014-09-15 DIAGNOSIS — Z9889 Other specified postprocedural states: Secondary | ICD-10-CM | POA: Diagnosis not present

## 2014-09-15 DIAGNOSIS — I2510 Atherosclerotic heart disease of native coronary artery without angina pectoris: Secondary | ICD-10-CM | POA: Diagnosis not present

## 2014-09-15 DIAGNOSIS — Z992 Dependence on renal dialysis: Secondary | ICD-10-CM | POA: Insufficient documentation

## 2014-09-15 DIAGNOSIS — H547 Unspecified visual loss: Secondary | ICD-10-CM | POA: Diagnosis not present

## 2014-09-15 DIAGNOSIS — R0682 Tachypnea, not elsewhere classified: Secondary | ICD-10-CM | POA: Diagnosis not present

## 2014-09-15 MED ORDER — TETRACAINE HCL 0.5 % OP SOLN
1.0000 [drp] | Freq: Once | OPHTHALMIC | Status: AC
  Administered 2014-09-15: 1 [drp] via OPHTHALMIC
  Filled 2014-09-15: qty 2

## 2014-09-15 NOTE — ED Notes (Signed)
EMS called to transport pt. Home.
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
## 2014-09-15 NOTE — Discharge Instructions (Signed)
Please see your ophthalmologist or follow-up with the one listed in your discharge instructions. If you develop pain in your eye, fevers or significant worsening symptoms back to the ER however you need to see a specialist.  If you were given medicines take as directed.  If you are on coumadin or contraceptives realize their levels and effectiveness is altered by many different medicines.  If you have any reaction (rash, tongues swelling, other) to the medicines stop taking and see a physician.   Please follow up as directed and return to the ER or see a physician for new or worsening symptoms.  Thank you. Filed Vitals:   09/15/14 1421 09/15/14 1430 09/15/14 1500  BP: 114/54 112/62 112/63  Pulse: 67 98 94  Temp: 98.4 F (36.9 C)    TempSrc: Oral    Resp: 18    Weight: 240 lb (108.863 kg)    SpO2: 94% 95% 93%

## 2014-09-15 NOTE — ED Notes (Addendum)
Pt is blind in right eye, and c/o vision "getting darker and darker" in left eye for last few weeks; pt is diabetic; dialysis schedule M/W/F, Pt in nad; denies dizziness or headache

## 2014-09-16 ENCOUNTER — Telehealth: Payer: Self-pay | Admitting: Family Medicine

## 2014-09-16 DIAGNOSIS — D631 Anemia in chronic kidney disease: Secondary | ICD-10-CM | POA: Diagnosis not present

## 2014-09-16 DIAGNOSIS — D509 Iron deficiency anemia, unspecified: Secondary | ICD-10-CM | POA: Diagnosis not present

## 2014-09-16 DIAGNOSIS — N186 End stage renal disease: Secondary | ICD-10-CM | POA: Diagnosis not present

## 2014-09-16 DIAGNOSIS — Z992 Dependence on renal dialysis: Secondary | ICD-10-CM | POA: Diagnosis not present

## 2014-09-16 MED ORDER — OXYCODONE-ACETAMINOPHEN 10-325 MG PO TABS: 1.0000 | ORAL_TABLET | Freq: Two times a day (BID) | ORAL | Status: DC | PRN

## 2014-09-16 NOTE — Telephone Encounter (Signed)
Okay to refill? 

## 2014-09-16 NOTE — Telephone Encounter (Signed)
Patient requesting refill on percocet [phone]

## 2014-09-16 NOTE — Telephone Encounter (Signed)
Prescription printed and patient made aware to come to office to pick up.  

## 2014-09-16 NOTE — Telephone Encounter (Signed)
Ok to refill??  Last office visit 08/27/2014.  Last refill 08/20/2014.

## 2014-09-17 DIAGNOSIS — E11349 Type 2 diabetes mellitus with severe nonproliferative diabetic retinopathy without macular edema: Secondary | ICD-10-CM | POA: Diagnosis not present

## 2014-09-18 ENCOUNTER — Telehealth: Payer: Self-pay | Admitting: Family Medicine

## 2014-09-18 NOTE — Telephone Encounter (Signed)
I called patient to check on him. His nurse aide was here today states that he seen to be a little confused lately. He was in the ER this weekend because of decreased vision in his left eye he was seen by his eye doctor who told him there was questionable concern about blood flow to his brain but I do not have any actual records of this conversation. He actually went to dialysis today. He states that he has not been feeling very well he had a fever the other day he denied any headache states that he did feel confused to time and just did not feel well. I asked him multiple times about the eye doctor and then he will referred to the hand doctor but then his orientation would clear up perfectly. I recommended that he be seen in the emergency room as he is unable to get transportation to my office without the use of a transportation service as he is a bilaterally PT he states that he is unable to go to the emergency room today and will go tomorrow after his hand doctor appointment. He does not have any current fever or chest pain shortness of breath or headache. I will follow up with his age to make sure that he does go to the emergency room I'm concerned that he may have had a TIA or stroke. He needs scan done.  I called pt aide Arnette Schaumann, at pt request, LVM, pt needs to go to ER for evaluation since he can not come into office

## 2014-09-19 ENCOUNTER — Emergency Department (HOSPITAL_COMMUNITY): Payer: Medicare Other

## 2014-09-19 ENCOUNTER — Emergency Department (HOSPITAL_COMMUNITY)
Admission: EM | Admit: 2014-09-19 | Discharge: 2014-09-19 | Disposition: A | Discharge status: Home / Self Care | Payer: Medicare Other | Source: Home / Self Care | Attending: Emergency Medicine | Admitting: Emergency Medicine

## 2014-09-19 ENCOUNTER — Encounter (HOSPITAL_COMMUNITY): Payer: Self-pay

## 2014-09-19 DIAGNOSIS — E11319 Type 2 diabetes mellitus with unspecified diabetic retinopathy without macular edema: Secondary | ICD-10-CM

## 2014-09-19 DIAGNOSIS — E669 Obesity, unspecified: Secondary | ICD-10-CM | POA: Insufficient documentation

## 2014-09-19 DIAGNOSIS — S299XXA Unspecified injury of thorax, initial encounter: Secondary | ICD-10-CM

## 2014-09-19 DIAGNOSIS — I2510 Atherosclerotic heart disease of native coronary artery without angina pectoris: Secondary | ICD-10-CM

## 2014-09-19 DIAGNOSIS — G8929 Other chronic pain: Secondary | ICD-10-CM

## 2014-09-19 DIAGNOSIS — I5042 Chronic combined systolic (congestive) and diastolic (congestive) heart failure: Secondary | ICD-10-CM | POA: Diagnosis present

## 2014-09-19 DIAGNOSIS — I255 Ischemic cardiomyopathy: Secondary | ICD-10-CM | POA: Diagnosis not present

## 2014-09-19 DIAGNOSIS — D638 Anemia in other chronic diseases classified elsewhere: Secondary | ICD-10-CM | POA: Diagnosis present

## 2014-09-19 DIAGNOSIS — Y92091 Bathroom in other non-institutional residence as the place of occurrence of the external cause: Secondary | ICD-10-CM

## 2014-09-19 DIAGNOSIS — N186 End stage renal disease: Secondary | ICD-10-CM | POA: Diagnosis present

## 2014-09-19 DIAGNOSIS — Z89612 Acquired absence of left leg above knee: Secondary | ICD-10-CM | POA: Diagnosis not present

## 2014-09-19 DIAGNOSIS — Z6832 Body mass index (BMI) 32.0-32.9, adult: Secondary | ICD-10-CM

## 2014-09-19 DIAGNOSIS — Z79899 Other long term (current) drug therapy: Secondary | ICD-10-CM

## 2014-09-19 DIAGNOSIS — Y9389 Activity, other specified: Secondary | ICD-10-CM | POA: Insufficient documentation

## 2014-09-19 DIAGNOSIS — N2581 Secondary hyperparathyroidism of renal origin: Secondary | ICD-10-CM | POA: Diagnosis present

## 2014-09-19 DIAGNOSIS — L89152 Pressure ulcer of sacral region, stage 2: Secondary | ICD-10-CM | POA: Diagnosis not present

## 2014-09-19 DIAGNOSIS — E1151 Type 2 diabetes mellitus with diabetic peripheral angiopathy without gangrene: Secondary | ICD-10-CM | POA: Diagnosis not present

## 2014-09-19 DIAGNOSIS — L02511 Cutaneous abscess of right hand: Secondary | ICD-10-CM | POA: Diagnosis not present

## 2014-09-19 DIAGNOSIS — E46 Unspecified protein-calorie malnutrition: Secondary | ICD-10-CM | POA: Diagnosis present

## 2014-09-19 DIAGNOSIS — R001 Bradycardia, unspecified: Secondary | ICD-10-CM | POA: Diagnosis not present

## 2014-09-19 DIAGNOSIS — I120 Hypertensive chronic kidney disease with stage 5 chronic kidney disease or end stage renal disease: Secondary | ICD-10-CM | POA: Diagnosis present

## 2014-09-19 DIAGNOSIS — L03113 Cellulitis of right upper limb: Secondary | ICD-10-CM | POA: Diagnosis present

## 2014-09-19 DIAGNOSIS — Y998 Other external cause status: Secondary | ICD-10-CM

## 2014-09-19 DIAGNOSIS — Z89021 Acquired absence of right finger(s): Secondary | ICD-10-CM | POA: Diagnosis not present

## 2014-09-19 DIAGNOSIS — S6991XA Unspecified injury of right wrist, hand and finger(s), initial encounter: Secondary | ICD-10-CM | POA: Insufficient documentation

## 2014-09-19 DIAGNOSIS — R279 Unspecified lack of coordination: Secondary | ICD-10-CM | POA: Diagnosis not present

## 2014-09-19 DIAGNOSIS — E875 Hyperkalemia: Secondary | ICD-10-CM | POA: Diagnosis present

## 2014-09-19 DIAGNOSIS — M898X9 Other specified disorders of bone, unspecified site: Secondary | ICD-10-CM | POA: Diagnosis present

## 2014-09-19 DIAGNOSIS — Z22322 Carrier or suspected carrier of Methicillin resistant Staphylococcus aureus: Secondary | ICD-10-CM

## 2014-09-19 DIAGNOSIS — W19XXXA Unspecified fall, initial encounter: Secondary | ICD-10-CM

## 2014-09-19 DIAGNOSIS — Z992 Dependence on renal dialysis: Secondary | ICD-10-CM | POA: Insufficient documentation

## 2014-09-19 DIAGNOSIS — Z8619 Personal history of other infectious and parasitic diseases: Secondary | ICD-10-CM

## 2014-09-19 DIAGNOSIS — K529 Noninfective gastroenteritis and colitis, unspecified: Secondary | ICD-10-CM | POA: Diagnosis present

## 2014-09-19 DIAGNOSIS — S0990XA Unspecified injury of head, initial encounter: Secondary | ICD-10-CM | POA: Diagnosis not present

## 2014-09-19 DIAGNOSIS — Z955 Presence of coronary angioplasty implant and graft: Secondary | ICD-10-CM

## 2014-09-19 DIAGNOSIS — S79912A Unspecified injury of left hip, initial encounter: Secondary | ICD-10-CM | POA: Diagnosis not present

## 2014-09-19 DIAGNOSIS — Z7902 Long term (current) use of antithrombotics/antiplatelets: Secondary | ICD-10-CM

## 2014-09-19 DIAGNOSIS — Z862 Personal history of diseases of the blood and blood-forming organs and certain disorders involving the immune mechanism: Secondary | ICD-10-CM | POA: Insufficient documentation

## 2014-09-19 DIAGNOSIS — A419 Sepsis, unspecified organism: Principal | ICD-10-CM | POA: Diagnosis present

## 2014-09-19 DIAGNOSIS — M7989 Other specified soft tissue disorders: Secondary | ICD-10-CM | POA: Diagnosis not present

## 2014-09-19 DIAGNOSIS — S3992XA Unspecified injury of lower back, initial encounter: Secondary | ICD-10-CM | POA: Diagnosis not present

## 2014-09-19 DIAGNOSIS — S300XXA Contusion of lower back and pelvis, initial encounter: Secondary | ICD-10-CM | POA: Diagnosis not present

## 2014-09-19 DIAGNOSIS — W050XXA Fall from non-moving wheelchair, initial encounter: Secondary | ICD-10-CM | POA: Insufficient documentation

## 2014-09-19 DIAGNOSIS — S20211A Contusion of right front wall of thorax, initial encounter: Secondary | ICD-10-CM | POA: Diagnosis not present

## 2014-09-19 DIAGNOSIS — S3993XA Unspecified injury of pelvis, initial encounter: Secondary | ICD-10-CM | POA: Diagnosis not present

## 2014-09-19 DIAGNOSIS — B951 Streptococcus, group B, as the cause of diseases classified elsewhere: Secondary | ICD-10-CM | POA: Diagnosis present

## 2014-09-19 DIAGNOSIS — M25552 Pain in left hip: Secondary | ICD-10-CM | POA: Diagnosis not present

## 2014-09-19 DIAGNOSIS — R6521 Severe sepsis with septic shock: Secondary | ICD-10-CM | POA: Diagnosis present

## 2014-09-19 DIAGNOSIS — Z89611 Acquired absence of right leg above knee: Secondary | ICD-10-CM

## 2014-09-19 DIAGNOSIS — M545 Low back pain: Secondary | ICD-10-CM | POA: Diagnosis not present

## 2014-09-19 DIAGNOSIS — A480 Gas gangrene: Secondary | ICD-10-CM | POA: Diagnosis not present

## 2014-09-19 DIAGNOSIS — Z7982 Long term (current) use of aspirin: Secondary | ICD-10-CM

## 2014-09-19 DIAGNOSIS — I252 Old myocardial infarction: Secondary | ICD-10-CM | POA: Diagnosis not present

## 2014-09-19 DIAGNOSIS — R400 Somnolence: Secondary | ICD-10-CM | POA: Diagnosis not present

## 2014-09-19 DIAGNOSIS — E11649 Type 2 diabetes mellitus with hypoglycemia without coma: Secondary | ICD-10-CM | POA: Diagnosis not present

## 2014-09-19 DIAGNOSIS — Z9889 Other specified postprocedural states: Secondary | ICD-10-CM | POA: Insufficient documentation

## 2014-09-19 DIAGNOSIS — T07XXXA Unspecified multiple injuries, initial encounter: Secondary | ICD-10-CM

## 2014-09-19 DIAGNOSIS — T148 Other injury of unspecified body region: Secondary | ICD-10-CM | POA: Insufficient documentation

## 2014-09-19 DIAGNOSIS — D72829 Elevated white blood cell count, unspecified: Secondary | ICD-10-CM | POA: Diagnosis not present

## 2014-09-19 DIAGNOSIS — Z743 Need for continuous supervision: Secondary | ICD-10-CM | POA: Diagnosis not present

## 2014-09-19 DIAGNOSIS — B952 Enterococcus as the cause of diseases classified elsewhere: Secondary | ICD-10-CM | POA: Diagnosis present

## 2014-09-19 DIAGNOSIS — T1490 Injury, unspecified: Secondary | ICD-10-CM | POA: Diagnosis not present

## 2014-09-19 DIAGNOSIS — R296 Repeated falls: Secondary | ICD-10-CM | POA: Diagnosis present
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
## 2014-09-19 NOTE — Discharge Instructions (Signed)
Recheck with your hand surgeon as scheduled.

## 2014-09-19 NOTE — Telephone Encounter (Signed)
Per chart notes, patient was evaluated in ED and CT performed.

## 2014-09-19 NOTE — ED Notes (Signed)
Pt called ems after he fell out of his wheelchair in the bathroom.  Pt c/o hand pain from a previous injury.  Per ems, pt denies complaints from current fall.

## 2014-09-20 ENCOUNTER — Inpatient Hospital Stay (HOSPITAL_COMMUNITY): Payer: Medicare Other | Admitting: Anesthesiology

## 2014-09-20 ENCOUNTER — Inpatient Hospital Stay (HOSPITAL_COMMUNITY)
Admission: EM | Admit: 2014-09-20 | Discharge: 2014-09-30 | DRG: 853 | Disposition: A | Discharge status: Skilled Nursing Facility | Payer: Medicare Other | Attending: Internal Medicine | Admitting: Internal Medicine

## 2014-09-20 ENCOUNTER — Inpatient Hospital Stay (HOSPITAL_COMMUNITY): Payer: Medicare Other

## 2014-09-20 ENCOUNTER — Emergency Department (HOSPITAL_COMMUNITY): Payer: Medicare Other

## 2014-09-20 ENCOUNTER — Other Ambulatory Visit (HOSPITAL_COMMUNITY): Payer: Self-pay | Admitting: *Deleted

## 2014-09-20 ENCOUNTER — Encounter (HOSPITAL_COMMUNITY): Payer: Self-pay

## 2014-09-20 ENCOUNTER — Encounter (HOSPITAL_COMMUNITY)
Admission: EM | Disposition: A | Discharge status: Skilled Nursing Facility | Payer: Self-pay | Source: Home / Self Care | Attending: Pulmonary Disease

## 2014-09-20 ENCOUNTER — Encounter: Payer: Self-pay | Admitting: Vascular Surgery

## 2014-09-20 DIAGNOSIS — S3992XA Unspecified injury of lower back, initial encounter: Secondary | ICD-10-CM | POA: Diagnosis not present

## 2014-09-20 DIAGNOSIS — L089 Local infection of the skin and subcutaneous tissue, unspecified: Secondary | ICD-10-CM | POA: Diagnosis not present

## 2014-09-20 DIAGNOSIS — I739 Peripheral vascular disease, unspecified: Secondary | ICD-10-CM | POA: Diagnosis present

## 2014-09-20 DIAGNOSIS — E875 Hyperkalemia: Secondary | ICD-10-CM

## 2014-09-20 DIAGNOSIS — Z451 Encounter for adjustment and management of infusion pump: Secondary | ICD-10-CM | POA: Diagnosis not present

## 2014-09-20 DIAGNOSIS — D72829 Elevated white blood cell count, unspecified: Secondary | ICD-10-CM | POA: Diagnosis present

## 2014-09-20 DIAGNOSIS — Z6832 Body mass index (BMI) 32.0-32.9, adult: Secondary | ICD-10-CM | POA: Diagnosis not present

## 2014-09-20 DIAGNOSIS — K529 Noninfective gastroenteritis and colitis, unspecified: Secondary | ICD-10-CM | POA: Diagnosis not present

## 2014-09-20 DIAGNOSIS — I5042 Chronic combined systolic (congestive) and diastolic (congestive) heart failure: Secondary | ICD-10-CM | POA: Diagnosis present

## 2014-09-20 DIAGNOSIS — N19 Unspecified kidney failure: Secondary | ICD-10-CM | POA: Diagnosis not present

## 2014-09-20 DIAGNOSIS — I951 Orthostatic hypotension: Secondary | ICD-10-CM | POA: Diagnosis not present

## 2014-09-20 DIAGNOSIS — I255 Ischemic cardiomyopathy: Secondary | ICD-10-CM | POA: Diagnosis not present

## 2014-09-20 DIAGNOSIS — Z89612 Acquired absence of left leg above knee: Secondary | ICD-10-CM

## 2014-09-20 DIAGNOSIS — M7989 Other specified soft tissue disorders: Secondary | ICD-10-CM | POA: Diagnosis not present

## 2014-09-20 DIAGNOSIS — Z89611 Acquired absence of right leg above knee: Secondary | ICD-10-CM

## 2014-09-20 DIAGNOSIS — S60511A Abrasion of right hand, initial encounter: Secondary | ICD-10-CM

## 2014-09-20 DIAGNOSIS — I313 Pericardial effusion (noninflammatory): Secondary | ICD-10-CM | POA: Diagnosis not present

## 2014-09-20 DIAGNOSIS — E669 Obesity, unspecified: Secondary | ICD-10-CM

## 2014-09-20 DIAGNOSIS — M545 Low back pain: Secondary | ICD-10-CM | POA: Diagnosis not present

## 2014-09-20 DIAGNOSIS — M726 Necrotizing fasciitis: Secondary | ICD-10-CM | POA: Diagnosis not present

## 2014-09-20 DIAGNOSIS — M79605 Pain in left leg: Secondary | ICD-10-CM | POA: Diagnosis not present

## 2014-09-20 DIAGNOSIS — E11649 Type 2 diabetes mellitus with hypoglycemia without coma: Secondary | ICD-10-CM | POA: Diagnosis not present

## 2014-09-20 DIAGNOSIS — N390 Urinary tract infection, site not specified: Secondary | ICD-10-CM

## 2014-09-20 DIAGNOSIS — I9589 Other hypotension: Secondary | ICD-10-CM | POA: Diagnosis present

## 2014-09-20 DIAGNOSIS — Z79899 Other long term (current) drug therapy: Secondary | ICD-10-CM | POA: Diagnosis not present

## 2014-09-20 DIAGNOSIS — I639 Cerebral infarction, unspecified: Secondary | ICD-10-CM

## 2014-09-20 DIAGNOSIS — Z22322 Carrier or suspected carrier of Methicillin resistant Staphylococcus aureus: Secondary | ICD-10-CM | POA: Diagnosis not present

## 2014-09-20 DIAGNOSIS — Z7982 Long term (current) use of aspirin: Secondary | ICD-10-CM | POA: Diagnosis not present

## 2014-09-20 DIAGNOSIS — D638 Anemia in other chronic diseases classified elsewhere: Secondary | ICD-10-CM | POA: Diagnosis present

## 2014-09-20 DIAGNOSIS — S0990XA Unspecified injury of head, initial encounter: Secondary | ICD-10-CM | POA: Diagnosis not present

## 2014-09-20 DIAGNOSIS — W19XXXA Unspecified fall, initial encounter: Secondary | ICD-10-CM

## 2014-09-20 DIAGNOSIS — Z452 Encounter for adjustment and management of vascular access device: Secondary | ICD-10-CM

## 2014-09-20 DIAGNOSIS — E119 Type 2 diabetes mellitus without complications: Secondary | ICD-10-CM | POA: Diagnosis not present

## 2014-09-20 DIAGNOSIS — E118 Type 2 diabetes mellitus with unspecified complications: Secondary | ICD-10-CM | POA: Diagnosis present

## 2014-09-20 DIAGNOSIS — N2581 Secondary hyperparathyroidism of renal origin: Secondary | ICD-10-CM | POA: Diagnosis not present

## 2014-09-20 DIAGNOSIS — L89152 Pressure ulcer of sacral region, stage 2: Secondary | ICD-10-CM | POA: Diagnosis not present

## 2014-09-20 DIAGNOSIS — E1151 Type 2 diabetes mellitus with diabetic peripheral angiopathy without gangrene: Secondary | ICD-10-CM | POA: Diagnosis not present

## 2014-09-20 DIAGNOSIS — S79912A Unspecified injury of left hip, initial encounter: Secondary | ICD-10-CM | POA: Diagnosis not present

## 2014-09-20 DIAGNOSIS — Z992 Dependence on renal dialysis: Secondary | ICD-10-CM

## 2014-09-20 DIAGNOSIS — Z4781 Encounter for orthopedic aftercare following surgical amputation: Secondary | ICD-10-CM | POA: Diagnosis not present

## 2014-09-20 DIAGNOSIS — R001 Bradycardia, unspecified: Secondary | ICD-10-CM | POA: Diagnosis not present

## 2014-09-20 DIAGNOSIS — L03113 Cellulitis of right upper limb: Secondary | ICD-10-CM | POA: Diagnosis not present

## 2014-09-20 DIAGNOSIS — L98499 Non-pressure chronic ulcer of skin of other sites with unspecified severity: Secondary | ICD-10-CM | POA: Diagnosis not present

## 2014-09-20 DIAGNOSIS — N186 End stage renal disease: Secondary | ICD-10-CM | POA: Diagnosis not present

## 2014-09-20 DIAGNOSIS — E46 Unspecified protein-calorie malnutrition: Secondary | ICD-10-CM | POA: Diagnosis not present

## 2014-09-20 DIAGNOSIS — L03119 Cellulitis of unspecified part of limb: Secondary | ICD-10-CM | POA: Diagnosis not present

## 2014-09-20 DIAGNOSIS — I10 Essential (primary) hypertension: Secondary | ICD-10-CM | POA: Diagnosis not present

## 2014-09-20 DIAGNOSIS — D649 Anemia, unspecified: Secondary | ICD-10-CM | POA: Diagnosis not present

## 2014-09-20 DIAGNOSIS — B952 Enterococcus as the cause of diseases classified elsewhere: Secondary | ICD-10-CM | POA: Diagnosis not present

## 2014-09-20 DIAGNOSIS — A480 Gas gangrene: Secondary | ICD-10-CM | POA: Diagnosis not present

## 2014-09-20 DIAGNOSIS — R6521 Severe sepsis with septic shock: Secondary | ICD-10-CM

## 2014-09-20 DIAGNOSIS — R52 Pain, unspecified: Secondary | ICD-10-CM | POA: Diagnosis not present

## 2014-09-20 DIAGNOSIS — L02511 Cutaneous abscess of right hand: Secondary | ICD-10-CM | POA: Diagnosis not present

## 2014-09-20 DIAGNOSIS — I120 Hypertensive chronic kidney disease with stage 5 chronic kidney disease or end stage renal disease: Secondary | ICD-10-CM | POA: Diagnosis not present

## 2014-09-20 DIAGNOSIS — A419 Sepsis, unspecified organism: Secondary | ICD-10-CM | POA: Diagnosis present

## 2014-09-20 DIAGNOSIS — I509 Heart failure, unspecified: Secondary | ICD-10-CM | POA: Diagnosis not present

## 2014-09-20 DIAGNOSIS — M6281 Muscle weakness (generalized): Secondary | ICD-10-CM | POA: Diagnosis not present

## 2014-09-20 DIAGNOSIS — R259 Unspecified abnormal involuntary movements: Secondary | ICD-10-CM | POA: Diagnosis not present

## 2014-09-20 DIAGNOSIS — E785 Hyperlipidemia, unspecified: Secondary | ICD-10-CM | POA: Diagnosis not present

## 2014-09-20 DIAGNOSIS — L0390 Cellulitis, unspecified: Secondary | ICD-10-CM

## 2014-09-20 DIAGNOSIS — Z89021 Acquired absence of right finger(s): Secondary | ICD-10-CM | POA: Diagnosis not present

## 2014-09-20 DIAGNOSIS — M25552 Pain in left hip: Secondary | ICD-10-CM | POA: Diagnosis not present

## 2014-09-20 DIAGNOSIS — I252 Old myocardial infarction: Secondary | ICD-10-CM | POA: Diagnosis not present

## 2014-09-20 DIAGNOSIS — N185 Chronic kidney disease, stage 5: Secondary | ICD-10-CM | POA: Diagnosis not present

## 2014-09-20 DIAGNOSIS — S58011A Complete traumatic amputation at elbow level, right arm, initial encounter: Secondary | ICD-10-CM | POA: Diagnosis not present

## 2014-09-20 DIAGNOSIS — Z89111 Acquired absence of right hand: Secondary | ICD-10-CM | POA: Diagnosis not present

## 2014-09-20 DIAGNOSIS — Z955 Presence of coronary angioplasty implant and graft: Secondary | ICD-10-CM | POA: Diagnosis not present

## 2014-09-20 DIAGNOSIS — M549 Dorsalgia, unspecified: Secondary | ICD-10-CM | POA: Diagnosis not present

## 2014-09-20 DIAGNOSIS — R739 Hyperglycemia, unspecified: Secondary | ICD-10-CM

## 2014-09-20 DIAGNOSIS — B951 Streptococcus, group B, as the cause of diseases classified elsewhere: Secondary | ICD-10-CM | POA: Diagnosis not present

## 2014-09-20 DIAGNOSIS — R296 Repeated falls: Secondary | ICD-10-CM | POA: Diagnosis not present

## 2014-09-20 DIAGNOSIS — Z7902 Long term (current) use of antithrombotics/antiplatelets: Secondary | ICD-10-CM | POA: Diagnosis not present

## 2014-09-20 DIAGNOSIS — M898X9 Other specified disorders of bone, unspecified site: Secondary | ICD-10-CM | POA: Diagnosis not present

## 2014-09-20 DIAGNOSIS — J9811 Atelectasis: Secondary | ICD-10-CM | POA: Diagnosis not present

## 2014-09-20 DIAGNOSIS — E11319 Type 2 diabetes mellitus with unspecified diabetic retinopathy without macular edema: Secondary | ICD-10-CM | POA: Diagnosis not present

## 2014-09-20 DIAGNOSIS — I2510 Atherosclerotic heart disease of native coronary artery without angina pectoris: Secondary | ICD-10-CM | POA: Diagnosis not present

## 2014-09-20 HISTORY — PX: I&D EXTREMITY: SHX5045

## 2014-09-20 LAB — URINALYSIS, ROUTINE W REFLEX MICROSCOPIC
Glucose, UA: NEGATIVE mg/dL
Ketones, ur: NEGATIVE mg/dL
NITRITE: NEGATIVE
Specific Gravity, Urine: 1.02 (ref 1.005–1.030)
UROBILINOGEN UA: 0.2 mg/dL (ref 0.0–1.0)
pH: 7 (ref 5.0–8.0)

## 2014-09-20 LAB — CBC
HEMATOCRIT: 37 % — AB (ref 39.0–52.0)
Hemoglobin: 12.7 g/dL — ABNORMAL LOW (ref 13.0–17.0)
MCH: 30.2 pg (ref 26.0–34.0)
MCHC: 34.3 g/dL (ref 30.0–36.0)
MCV: 88.1 fL (ref 78.0–100.0)
Platelets: 89 10*3/uL — ABNORMAL LOW (ref 150–400)
RBC: 4.2 MIL/uL — AB (ref 4.22–5.81)
RDW: 16.7 % — ABNORMAL HIGH (ref 11.5–15.5)
WBC: 15.5 10*3/uL — ABNORMAL HIGH (ref 4.0–10.5)

## 2014-09-20 LAB — RENAL FUNCTION PANEL
ANION GAP: 20 — AB (ref 5–15)
Albumin: 2.4 g/dL — ABNORMAL LOW (ref 3.5–5.0)
BUN: 85 mg/dL — ABNORMAL HIGH (ref 6–20)
CALCIUM: 9.5 mg/dL (ref 8.9–10.3)
CHLORIDE: 95 mmol/L — AB (ref 101–111)
CO2: 21 mmol/L — AB (ref 22–32)
Creatinine, Ser: 10.21 mg/dL — ABNORMAL HIGH (ref 0.61–1.24)
GFR calc Af Amer: 5 mL/min — ABNORMAL LOW (ref >60)
GFR calc non Af Amer: 5 mL/min — ABNORMAL LOW (ref >60)
Glucose, Bld: 144 mg/dL — ABNORMAL HIGH (ref 65–99)
PHOSPHORUS: 8.4 mg/dL — AB (ref 2.5–4.6)
Potassium: 5.1 mmol/L (ref 3.5–5.1)
Sodium: 136 mmol/L (ref 135–145)

## 2014-09-20 LAB — COMPREHENSIVE METABOLIC PANEL
ALT: 17 U/L (ref 17–63)
AST: 76 U/L — AB (ref 15–41)
Albumin: 3 g/dL — ABNORMAL LOW (ref 3.5–5.0)
Alkaline Phosphatase: 146 U/L — ABNORMAL HIGH (ref 38–126)
BUN: 79 mg/dL — ABNORMAL HIGH (ref 6–20)
CHLORIDE: 95 mmol/L — AB (ref 101–111)
CO2: 18 mmol/L — AB (ref 22–32)
CREATININE: 10.04 mg/dL — AB (ref 0.61–1.24)
Calcium: 9.7 mg/dL (ref 8.9–10.3)
GFR calc non Af Amer: 5 mL/min — ABNORMAL LOW (ref >60)
GFR, EST AFRICAN AMERICAN: 5 mL/min — AB (ref >60)
GLUCOSE: 226 mg/dL — AB (ref 65–99)
POTASSIUM: 5.7 mmol/L — AB (ref 3.5–5.1)
SODIUM: 135 mmol/L (ref 135–145)
TOTAL PROTEIN: 7.7 g/dL (ref 6.5–8.1)
Total Bilirubin: 3.3 mg/dL — ABNORMAL HIGH (ref 0.3–1.2)

## 2014-09-20 LAB — CBC WITH DIFFERENTIAL/PLATELET
Basophils Absolute: 0 10*3/uL (ref 0.0–0.1)
Basophils Relative: 0 % (ref 0–1)
EOS PCT: 0 % (ref 0–5)
Eosinophils Absolute: 0 10*3/uL (ref 0.0–0.7)
HCT: 36.8 % — ABNORMAL LOW (ref 39.0–52.0)
Hemoglobin: 12.3 g/dL — ABNORMAL LOW (ref 13.0–17.0)
Lymphocytes Relative: 4 % — ABNORMAL LOW (ref 12–46)
Lymphs Abs: 0.7 10*3/uL (ref 0.7–4.0)
MCH: 30.6 pg (ref 26.0–34.0)
MCHC: 33.4 g/dL (ref 30.0–36.0)
MCV: 91.5 fL (ref 78.0–100.0)
MONOS PCT: 5 % (ref 3–12)
Monocytes Absolute: 1 10*3/uL (ref 0.1–1.0)
NEUTROS PCT: 90 % — AB (ref 43–77)
Neutro Abs: 16.5 10*3/uL — ABNORMAL HIGH (ref 1.7–7.7)
Platelets: 98 10*3/uL — ABNORMAL LOW (ref 150–400)
RBC: 4.02 MIL/uL — ABNORMAL LOW (ref 4.22–5.81)
RDW: 16.7 % — ABNORMAL HIGH (ref 11.5–15.5)
SMEAR REVIEW: DECREASED
WBC: 18.3 10*3/uL — AB (ref 4.0–10.5)

## 2014-09-20 LAB — URINE MICROSCOPIC-ADD ON

## 2014-09-20 LAB — GLUCOSE, CAPILLARY
GLUCOSE-CAPILLARY: 123 mg/dL — AB (ref 65–99)
GLUCOSE-CAPILLARY: 144 mg/dL — AB (ref 65–99)
GLUCOSE-CAPILLARY: 117 mg/dL — AB (ref 65–99)
GLUCOSE-CAPILLARY: 100 mg/dL — AB (ref 65–99)
Glucose-Capillary: 128 mg/dL — ABNORMAL HIGH (ref 65–99)

## 2014-09-20 LAB — CBG MONITORING, ED
GLUCOSE-CAPILLARY: 229 mg/dL — AB (ref 65–99)
Glucose-Capillary: 163 mg/dL — ABNORMAL HIGH (ref 65–99)

## 2014-09-20 LAB — LACTIC ACID, PLASMA
LACTIC ACID, VENOUS: 3.1 mmol/L — AB (ref 0.5–2.0)
Lactic Acid, Venous: 3.1 mmol/L (ref 0.5–2.0)

## 2014-09-20 LAB — MRSA PCR SCREENING: MRSA BY PCR: POSITIVE — AB

## 2014-09-20 LAB — CREATININE, SERUM
Creatinine, Ser: 10.35 mg/dL — ABNORMAL HIGH (ref 0.61–1.24)
GFR calc non Af Amer: 4 mL/min — ABNORMAL LOW (ref >60)
GFR, EST AFRICAN AMERICAN: 5 mL/min — AB (ref >60)

## 2014-09-20 SURGERY — IRRIGATION AND DEBRIDEMENT EXTREMITY
Anesthesia: General | Laterality: Right

## 2014-09-20 MED ORDER — INSULIN ASPART 100 UNIT/ML ~~LOC~~ SOLN: 0.0000 [IU] | Freq: Every day | SUBCUTANEOUS | Status: DC

## 2014-09-20 MED ORDER — CISATRACURIUM BESYLATE 20 MG/10ML IV SOLN
INTRAVENOUS | Status: AC
  Filled 2014-09-20: qty 10

## 2014-09-20 MED ORDER — ATORVASTATIN CALCIUM 80 MG PO TABS: 80.0000 mg | ORAL_TABLET | Freq: Every evening | ORAL | Status: DC

## 2014-09-20 MED ORDER — PROPOFOL 10 MG/ML IV BOLUS
INTRAVENOUS | Status: DC | PRN
  Administered 2014-09-20: 100 mg via INTRAVENOUS

## 2014-09-20 MED ORDER — HEPARIN SODIUM (PORCINE) 5000 UNIT/ML IJ SOLN
5000.0000 [IU] | Freq: Three times a day (TID) | INTRAMUSCULAR | Status: DC
  Administered 2014-09-20 – 2014-09-30 (×18): 5000 [IU] via SUBCUTANEOUS
  Filled 2014-09-20 (×31): qty 1

## 2014-09-20 MED ORDER — SODIUM CHLORIDE 0.9 % IR SOLN
Status: DC | PRN
  Administered 2014-09-20: 3000 mL
  Administered 2014-09-20: 1000 mL

## 2014-09-20 MED ORDER — LABETALOL HCL 200 MG PO TABS: 200.0000 mg | ORAL_TABLET | Freq: Two times a day (BID) | ORAL | Status: DC

## 2014-09-20 MED ORDER — DOPAMINE-DEXTROSE 3.2-5 MG/ML-% IV SOLN
0.0000 ug/kg/min | INTRAVENOUS | Status: DC
Start: 2014-09-20 — End: 2014-09-21
  Administered 2014-09-20: 5 ug/kg/min via INTRAVENOUS

## 2014-09-20 MED ORDER — DICYCLOMINE HCL 10 MG PO CAPS
10.0000 mg | ORAL_CAPSULE | Freq: Three times a day (TID) | ORAL | Status: DC
  Administered 2014-09-20 – 2014-09-30 (×26): 10 mg via ORAL
  Filled 2014-09-20 (×44): qty 1

## 2014-09-20 MED ORDER — PROMETHAZINE HCL 25 MG/ML IJ SOLN: 6.2500 mg | INTRAMUSCULAR | Status: DC | PRN

## 2014-09-20 MED ORDER — GLYCOPYRROLATE 0.2 MG/ML IJ SOLN
INTRAMUSCULAR | Status: AC
  Filled 2014-09-20: qty 4

## 2014-09-20 MED ORDER — ACETAMINOPHEN 325 MG PO TABS
650.0000 mg | ORAL_TABLET | Freq: Four times a day (QID) | ORAL | Status: DC | PRN
  Administered 2014-09-30: 650 mg via ORAL
  Filled 2014-09-20: qty 2

## 2014-09-20 MED ORDER — SODIUM CHLORIDE 0.9 % IV SOLN
INTRAVENOUS | Status: DC
  Administered 2014-09-20: 13:00:00 via INTRAVENOUS

## 2014-09-20 MED ORDER — SODIUM CHLORIDE 0.9 % IJ SOLN
3.0000 mL | Freq: Two times a day (BID) | INTRAMUSCULAR | Status: DC
  Administered 2014-09-20 – 2014-09-29 (×14): 3 mL via INTRAVENOUS

## 2014-09-20 MED ORDER — ONDANSETRON HCL 4 MG/2ML IJ SOLN: 4.0000 mg | Freq: Four times a day (QID) | INTRAMUSCULAR | Status: DC | PRN

## 2014-09-20 MED ORDER — MIDODRINE HCL 5 MG PO TABS
5.0000 mg | ORAL_TABLET | Freq: Three times a day (TID) | ORAL | Status: DC
  Administered 2014-09-20: 5 mg via ORAL
  Filled 2014-09-20 (×5): qty 1

## 2014-09-20 MED ORDER — PIPERACILLIN-TAZOBACTAM IN DEX 2-0.25 GM/50ML IV SOLN
2.2500 g | Freq: Three times a day (TID) | INTRAVENOUS | Status: DC
  Administered 2014-09-20 – 2014-09-25 (×12): 2.25 g via INTRAVENOUS
  Filled 2014-09-20 (×16): qty 50

## 2014-09-20 MED ORDER — ONDANSETRON HCL 4 MG/2ML IJ SOLN
INTRAMUSCULAR | Status: DC | PRN
  Administered 2014-09-20: 4 mg via INTRAVENOUS

## 2014-09-20 MED ORDER — VANCOMYCIN HCL IN DEXTROSE 1-5 GM/200ML-% IV SOLN
INTRAVENOUS | Status: AC
  Filled 2014-09-20: qty 200

## 2014-09-20 MED ORDER — RENA-VITE PO TABS
1.0000 | ORAL_TABLET | Freq: Every day | ORAL | Status: DC
  Administered 2014-09-22 – 2014-09-30 (×4): 1 via ORAL
  Filled 2014-09-20 (×11): qty 1

## 2014-09-20 MED ORDER — CISATRACURIUM BESYLATE (PF) 10 MG/5ML IV SOLN
INTRAVENOUS | Status: DC | PRN
  Administered 2014-09-20: 16 mg via INTRAVENOUS
  Administered 2014-09-20: 4 mg via INTRAVENOUS

## 2014-09-20 MED ORDER — MIDAZOLAM HCL 2 MG/2ML IJ SOLN
INTRAMUSCULAR | Status: AC
  Filled 2014-09-20: qty 2

## 2014-09-20 MED ORDER — ONDANSETRON HCL 4 MG PO TABS: 4.0000 mg | ORAL_TABLET | Freq: Four times a day (QID) | ORAL | Status: DC | PRN

## 2014-09-20 MED ORDER — VANCOMYCIN HCL IN DEXTROSE 1-5 GM/200ML-% IV SOLN
1000.0000 mg | Freq: Once | INTRAVENOUS | Status: AC
  Administered 2014-09-20: 1000 mg via INTRAVENOUS
  Filled 2014-09-20: qty 200

## 2014-09-20 MED ORDER — EPHEDRINE SULFATE 50 MG/ML IJ SOLN
INTRAMUSCULAR | Status: DC | PRN
  Administered 2014-09-20: 10 mg via INTRAVENOUS

## 2014-09-20 MED ORDER — HYDROMORPHONE HCL 1 MG/ML IJ SOLN: 0.2500 mg | INTRAMUSCULAR | Status: DC | PRN

## 2014-09-20 MED ORDER — INSULIN ASPART 100 UNIT/ML ~~LOC~~ SOLN
0.0000 [IU] | Freq: Three times a day (TID) | SUBCUTANEOUS | Status: DC
  Administered 2014-09-21: 1 [IU] via SUBCUTANEOUS
  Administered 2014-09-22: 2 [IU] via SUBCUTANEOUS
  Administered 2014-09-22 – 2014-09-23 (×3): 1 [IU] via SUBCUTANEOUS
  Administered 2014-09-24: 2 [IU] via SUBCUTANEOUS
  Administered 2014-09-28: 1 [IU] via SUBCUTANEOUS
  Administered 2014-09-29: 2 [IU] via SUBCUTANEOUS

## 2014-09-20 MED ORDER — DEXTROSE 5 % IV SOLN
10.0000 mg | INTRAVENOUS | Status: DC | PRN
  Administered 2014-09-20: 25 ug/min via INTRAVENOUS

## 2014-09-20 MED ORDER — NEPRO/CARBSTEADY PO LIQD
237.0000 mL | ORAL | Status: DC | PRN
  Filled 2014-09-20: qty 237

## 2014-09-20 MED ORDER — PHENYLEPHRINE HCL 10 MG/ML IJ SOLN
INTRAMUSCULAR | Status: DC | PRN
  Administered 2014-09-20: 40 ug via INTRAVENOUS
  Administered 2014-09-20 (×2): 80 ug via INTRAVENOUS

## 2014-09-20 MED ORDER — ATORVASTATIN CALCIUM 80 MG PO TABS
80.0000 mg | ORAL_TABLET | Freq: Every evening | ORAL | Status: DC
  Administered 2014-09-21 – 2014-09-29 (×7): 80 mg via ORAL
  Filled 2014-09-20 (×10): qty 1

## 2014-09-20 MED ORDER — LIDOCAINE HCL (PF) 1 % IJ SOLN: 5.0000 mL | INTRAMUSCULAR | Status: DC | PRN

## 2014-09-20 MED ORDER — HEPARIN SODIUM (PORCINE) 1000 UNIT/ML DIALYSIS
1000.0000 [IU] | INTRAMUSCULAR | Status: DC | PRN
  Filled 2014-09-20: qty 1

## 2014-09-20 MED ORDER — ACETAMINOPHEN 650 MG RE SUPP: 650.0000 mg | Freq: Four times a day (QID) | RECTAL | Status: DC | PRN

## 2014-09-20 MED ORDER — LABETALOL HCL 200 MG PO TABS
200.0000 mg | ORAL_TABLET | Freq: Two times a day (BID) | ORAL | Status: DC
  Filled 2014-09-20: qty 1

## 2014-09-20 MED ORDER — DOPAMINE-DEXTROSE 3.2-5 MG/ML-% IV SOLN
2.0000 ug/kg/min | INTRAVENOUS | Status: DC
  Administered 2014-09-20: 5 ug/kg/min via INTRAVENOUS

## 2014-09-20 MED ORDER — SEVELAMER CARBONATE 800 MG PO TABS
1600.0000 mg | ORAL_TABLET | Freq: Two times a day (BID) | ORAL | Status: DC
  Administered 2014-09-21 – 2014-09-25 (×6): 1600 mg via ORAL
  Filled 2014-09-20 (×15): qty 2

## 2014-09-20 MED ORDER — MIDAZOLAM HCL 2 MG/2ML IJ SOLN
INTRAMUSCULAR | Status: AC
  Administered 2014-09-20: 0.5 mg via INTRAVENOUS
  Filled 2014-09-20: qty 4

## 2014-09-20 MED ORDER — NEOSTIGMINE METHYLSULFATE 10 MG/10ML IV SOLN
INTRAVENOUS | Status: DC | PRN
  Administered 2014-09-20: 5 mg via INTRAVENOUS

## 2014-09-20 MED ORDER — DOPAMINE-DEXTROSE 3.2-5 MG/ML-% IV SOLN
INTRAVENOUS | Status: AC
  Administered 2014-09-20: 5 ug/kg/min via INTRAVENOUS
  Filled 2014-09-20: qty 250

## 2014-09-20 MED ORDER — ALTEPLASE 2 MG IJ SOLR: 2.0000 mg | Freq: Once | INTRAMUSCULAR | Status: AC | PRN

## 2014-09-20 MED ORDER — FENTANYL CITRATE (PF) 250 MCG/5ML IJ SOLN
INTRAMUSCULAR | Status: AC
  Filled 2014-09-20: qty 5

## 2014-09-20 MED ORDER — NEOSTIGMINE METHYLSULFATE 10 MG/10ML IV SOLN
INTRAVENOUS | Status: AC
  Filled 2014-09-20: qty 1

## 2014-09-20 MED ORDER — ASPIRIN EC 81 MG PO TBEC: 81.0000 mg | DELAYED_RELEASE_TABLET | Freq: Every day | ORAL | Status: DC

## 2014-09-20 MED ORDER — GLYCOPYRROLATE 0.2 MG/ML IJ SOLN
INTRAMUSCULAR | Status: DC | PRN
  Administered 2014-09-20: .8 mg via INTRAVENOUS

## 2014-09-20 MED ORDER — SODIUM CHLORIDE 0.9 % IV SOLN
100.0000 mL | INTRAVENOUS | Status: DC | PRN
Start: 2014-09-20 — End: 2014-09-21

## 2014-09-20 MED ORDER — MORPHINE SULFATE 2 MG/ML IJ SOLN
2.0000 mg | INTRAMUSCULAR | Status: DC | PRN
  Administered 2014-09-22 – 2014-09-29 (×6): 2 mg via INTRAVENOUS
  Filled 2014-09-20 (×7): qty 1

## 2014-09-20 MED ORDER — MIDAZOLAM HCL 2 MG/2ML IJ SOLN
0.5000 mg | Freq: Once | INTRAMUSCULAR | Status: AC | PRN
  Administered 2014-09-20: 0.5 mg via INTRAVENOUS

## 2014-09-20 MED ORDER — SODIUM CHLORIDE 0.9 % IV SOLN: 100.0000 mL | INTRAVENOUS | Status: DC | PRN

## 2014-09-20 MED ORDER — PENTAFLUOROPROP-TETRAFLUOROETH EX AERO: 1.0000 "application " | INHALATION_SPRAY | CUTANEOUS | Status: DC | PRN

## 2014-09-20 MED ORDER — PIPERACILLIN-TAZOBACTAM 3.375 G IVPB 30 MIN
3.3750 g | Freq: Once | INTRAVENOUS | Status: AC
  Administered 2014-09-20: 3.375 g via INTRAVENOUS
  Filled 2014-09-20: qty 50

## 2014-09-20 MED ORDER — INSULIN ASPART 100 UNIT/ML ~~LOC~~ SOLN
10.0000 [IU] | Freq: Once | SUBCUTANEOUS | Status: AC
  Administered 2014-09-20: 10 [IU] via INTRAVENOUS
  Filled 2014-09-20: qty 1

## 2014-09-20 MED ORDER — CINACALCET HCL 30 MG PO TABS
90.0000 mg | ORAL_TABLET | Freq: Every day | ORAL | Status: DC
  Administered 2014-09-21 – 2014-09-29 (×5): 90 mg via ORAL
  Filled 2014-09-20 (×10): qty 3

## 2014-09-20 MED ORDER — LIDOCAINE-PRILOCAINE 2.5-2.5 % EX CREA: 1.0000 "application " | TOPICAL_CREAM | CUTANEOUS | Status: DC | PRN

## 2014-09-20 MED ORDER — SEVELAMER CARBONATE 800 MG PO TABS
2400.0000 mg | ORAL_TABLET | Freq: Three times a day (TID) | ORAL | Status: DC
  Filled 2014-09-20 (×2): qty 3

## 2014-09-20 MED ORDER — MEPERIDINE HCL 25 MG/ML IJ SOLN: 6.2500 mg | INTRAMUSCULAR | Status: DC | PRN

## 2014-09-20 SURGICAL SUPPLY — 51 items
BANDAGE COBAN STERILE 2 (GAUZE/BANDAGES/DRESSINGS) IMPLANT
BANDAGE ELASTIC 3 VELCRO ST LF (GAUZE/BANDAGES/DRESSINGS) ×3 IMPLANT
BANDAGE ELASTIC 4 VELCRO ST LF (GAUZE/BANDAGES/DRESSINGS) ×3 IMPLANT
BNDG COHESIVE 1X5 TAN STRL LF (GAUZE/BANDAGES/DRESSINGS) IMPLANT
BNDG CONFORM 2 STRL LF (GAUZE/BANDAGES/DRESSINGS) IMPLANT
BNDG ESMARK 4X9 LF (GAUZE/BANDAGES/DRESSINGS) IMPLANT
BNDG GAUZE ELAST 4 BULKY (GAUZE/BANDAGES/DRESSINGS) ×3 IMPLANT
CORDS BIPOLAR (ELECTRODE) ×3 IMPLANT
COVER SURGICAL LIGHT HANDLE (MISCELLANEOUS) ×3 IMPLANT
DECANTER SPIKE VIAL GLASS SM (MISCELLANEOUS) ×3 IMPLANT
DRAIN PENROSE 1/4X12 LTX STRL (WOUND CARE) IMPLANT
DRSG ADAPTIC 3X8 NADH LF (GAUZE/BANDAGES/DRESSINGS) IMPLANT
DRSG EMULSION OIL 3X3 NADH (GAUZE/BANDAGES/DRESSINGS) ×3 IMPLANT
DRSG PAD ABDOMINAL 8X10 ST (GAUZE/BANDAGES/DRESSINGS) ×6 IMPLANT
GAUZE PACKING IODOFORM 1X5 (MISCELLANEOUS) ×3 IMPLANT
GAUZE PACKING IODOFORM 2 (PACKING) ×3 IMPLANT
GAUZE SPONGE 4X4 12PLY STRL (GAUZE/BANDAGES/DRESSINGS) ×3 IMPLANT
GAUZE XEROFORM 1X8 LF (GAUZE/BANDAGES/DRESSINGS) ×3 IMPLANT
GLOVE BIO SURGEON STRL SZ7.5 (GLOVE) ×3 IMPLANT
GLOVE BIOGEL PI IND STRL 8 (GLOVE) ×1 IMPLANT
GLOVE BIOGEL PI INDICATOR 8 (GLOVE) ×2
GOWN STRL REUS W/ TWL LRG LVL3 (GOWN DISPOSABLE) ×1 IMPLANT
GOWN STRL REUS W/TWL LRG LVL3 (GOWN DISPOSABLE) ×2
KIT BASIN OR (CUSTOM PROCEDURE TRAY) ×3 IMPLANT
KIT ROOM TURNOVER OR (KITS) ×3 IMPLANT
LOOP VESSEL MAXI BLUE (MISCELLANEOUS) IMPLANT
LOOP VESSEL MINI RED (MISCELLANEOUS) IMPLANT
MANIFOLD NEPTUNE II (INSTRUMENTS) ×3 IMPLANT
NEEDLE HYPO 25X1 1.5 SAFETY (NEEDLE) IMPLANT
NS IRRIG 1000ML POUR BTL (IV SOLUTION) ×3 IMPLANT
PACK ORTHO EXTREMITY (CUSTOM PROCEDURE TRAY) ×3 IMPLANT
PAD ARMBOARD 7.5X6 YLW CONV (MISCELLANEOUS) ×6 IMPLANT
SCRUB BETADINE 4OZ XXX (MISCELLANEOUS) ×3 IMPLANT
SET CYSTO W/LG BORE CLAMP LF (SET/KITS/TRAYS/PACK) ×3 IMPLANT
SOLUTION BETADINE 4OZ (MISCELLANEOUS) ×3 IMPLANT
SPONGE GAUZE 4X4 12PLY STER LF (GAUZE/BANDAGES/DRESSINGS) ×9 IMPLANT
SPONGE LAP 18X18 X RAY DECT (DISPOSABLE) ×3 IMPLANT
SPONGE LAP 4X18 X RAY DECT (DISPOSABLE) ×3 IMPLANT
SUCTION FRAZIER TIP 10 FR DISP (SUCTIONS) ×3 IMPLANT
SUT ETHILON 4 0 PS 2 18 (SUTURE) ×3 IMPLANT
SUT MON AB 5-0 P3 18 (SUTURE) IMPLANT
SYR CONTROL 10ML LL (SYRINGE) IMPLANT
TOWEL OR 17X24 6PK STRL BLUE (TOWEL DISPOSABLE) ×3 IMPLANT
TOWEL OR 17X26 10 PK STRL BLUE (TOWEL DISPOSABLE) ×3 IMPLANT
TUBE ANAEROBIC SPECIMEN COL (MISCELLANEOUS) IMPLANT
TUBE CONNECTING 12'X1/4 (SUCTIONS) ×1
TUBE CONNECTING 12X1/4 (SUCTIONS) ×2 IMPLANT
TUBE FEEDING 5FR 15 INCH (TUBING) IMPLANT
UNDERPAD 30X30 INCONTINENT (UNDERPADS AND DIAPERS) ×3 IMPLANT
WATER STERILE IRR 1000ML POUR (IV SOLUTION) ×3 IMPLANT
YANKAUER SUCT BULB TIP NO VENT (SUCTIONS) ×3 IMPLANT

## 2014-09-20 NOTE — Brief Op Note (Addendum)
09/20/2014  4:33 PM  PATIENT:  Julian [name]  69 y.o. male  PRE-OPERATIVE DIAGNOSIS:  RIGHT HAND Gas gangrene  POST-OPERATIVE DIAGNOSIS:  right hand gas gangrene   PROCEDURE:  Procedure(s): IRRIGATION AND DEBRIDEMENT RIGHT HAND (Right)  SURGEON:  Surgeon(s) and Role:    * Leanora Cover, MD - Primary  PHYSICIAN ASSISTANT:   ASSISTANTS: none   ANESTHESIA:   general  EBL:  Total I/O In: 400 [I.V.:400] Out: 30 [Blood:30]  BLOOD ADMINISTERED:none  DRAINS: iodoform packing  LOCAL MEDICATIONS USED:  NONE  SPECIMEN:  Source of Specimen:  right hand  DISPOSITION OF SPECIMEN:  micro  COUNTS:  YES  TOURNIQUET:   Total Tourniquet Time Documented: Forearm (Right) - 77 minutes Total: Forearm (Right) - 77 minutes   DICTATION: .Other Dictation: Dictation Number [id number]  PLAN OF CARE: Admit to inpatient   PATIENT DISPOSITION:  PACU - hemodynamically stable.   Delay start of Pharmacological VTE agent (>24hrs) due to surgical blood loss or risk of bleeding: no

## 2014-09-20 NOTE — ED Notes (Signed)
Dr Earlie Counts paged to verify orders.
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
## 2014-09-20 NOTE — Transfer of Care (Signed)
Immediate Anesthesia Transfer of Care Note  Patient: Julian [name]  Procedure(s) Performed: Procedure(s): IRRIGATION AND DEBRIDEMENT RIGHT HAND (Right)  Patient Location: PACU  Anesthesia Type:General  Level of Consciousness: awake and alert   Airway & Oxygen Therapy: Patient Spontanous Breathing and Patient connected to face mask oxygen  Post-op Assessment: Report given to RN and Post -op Vital signs reviewed and stable  Post vital signs: Reviewed and stable  Last Vitals:  Filed Vitals:   09/20/14 1630  BP:   Pulse: 89  Temp: 36.1 C  Resp: 14    Complications: No apparent anesthesia complications

## 2014-09-20 NOTE — ED Notes (Signed)
Report given to [facility] with [facility] and report given to Rich Reining, RN at [facility].

## 2014-09-20 NOTE — ED Notes (Signed)
Lab called with Lactic Acid 3.1, informed her that patient has already left with [facility]

## 2014-09-20 NOTE — ED Notes (Signed)
Hospitalist in to see pt. #rd lab tech in and able to get labs.  Antibiotics started.

## 2014-09-20 NOTE — ED Notes (Signed)
Pt reportedly fell at home while trying to get to the restroom from the commode to the floor.
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
## 2014-09-20 NOTE — Significant Event (Signed)
1835pm-Received patient from PACU into 54M03. Patient arrived on room air, is alert and oriented to self only, swearing, and swinging right arm against staff, left arm was in soft wrist restraint. Patient arrived with dopamine @5mcg , NS @10  via left forearm PIV. Very agitated-wanting to pee sitting up in his own toilet at home.   Noted at admission here to 55M with pressure ulcer stage 2 on sacrum, multiple skin tears on left lower buttocks. Place foam dressing on sacrum. Patient had a BM at arrival. Placed on monitors. Was tachycardiac in the 110-150. RN turned off dopamine. Left arm soft wrist restraint removed. Assisted patient with urinal. Bed alarm on. Patient oriented to room and unit. No personal belongings arrived with patient; no family at bedside.

## 2014-09-20 NOTE — Progress Notes (Signed)
[facility] Progress Note Patient Name: Julian [name] DOB: 1945-10-29 MRN: [id number]   Date of Service  09/20/2014  HPI/Events of Note  Patient is s/p surgery is alert in NAD but BP is 62/41.  H/O of hypotension on oral midodrine.  Midodrine was restarted.  Had been on DA gtt in OR.  eICU Interventions  Plan: Restart DA gtt for BP support Attempt to determine patient's baaseline BP.     Intervention Category Major Interventions: Hypotension - evaluation and management  Prue Lingenfelter 09/20/2014, 9:48 PM

## 2014-09-20 NOTE — Consult Note (Signed)
PULMONARY / CRITICAL CARE MEDICINE   Name: Julian [name] MRN: [id number] DOB: 12-29-1945    ADMISSION DATE:  09/20/2014 CONSULTATION DATE:  09/20/14  REFERRING MD :  Dr Wyline Copas  CHIEF COMPLAINT:  Septic Shock  INITIAL PRESENTATION: 26 w ESRD on HD, severe PVD, DM. Transferred to [facility] for surgical debridement of R hand wound. He was hypotensive post-op and transferred to ICU on dopamine. PCCM to assume care until stabilized.   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 He was evaluated in the ED at [facility] on 5/12 and then again on 5/13 for falls and concern for possible CVA. Head CT scan was reassuring and no traumatic injury was identified. He was found to have purulent drainage from a chronic wound of the R hand. Clinical picture consistent w sepsis and weakness / falls. He transferred to [facility] 5/13 directly to OR for debridement of R hand by Dr Fredna Dow. Post-op he was successfully extubated but hypotensive in the PACU. Dopamine was started and he transferred to ICU for further care.   PAST MEDICAL HISTORY :   has a past medical history of Hypertension; Diabetes mellitus; Peripheral vascular disease; Obesity; Chronic back pain; ESRD on hemodialysis; Chronic diarrhea; Leg pain (10/13); C. difficile diarrhea (07/2012); Diabetic retinopathy associated with type 2 diabetes mellitus; CAD (coronary artery disease); Ischemic cardiomyopathy; CHB (complete heart block); Thrombocytopenia; Second degree AV block, Mobitz type I; Gangrene of lower extremity (02/18/2014); Myocardial infarction; and Anemia.  has past surgical history that includes Above knee leg amputaton; Arteriovenous graft placement; vein  bypass graft,aorto-fem-pop; Insertion of dialysis catheter (07/30/2011); Colonoscopy (Dec 2007); AV fistula placement; Bascilic Vein Transposition (08/24/2011); Eye surgery; AV fistula placement (01/04/2012); Colonoscopy (N/A, 07/13/2012); Cataract extraction (Bilateral); AV fistula placement (Right, 12/25/2012); Amputation (Left, 02/25/2014); shuntogram (N/A, 12/09/2011); shuntogram (Right, 01/26/2013); left heart catheterization with coronary angiogram (N/A, 09/18/2013); Above knee leg amputaton (Left); and Amputation (Right, 07/23/2014). Prior to Admission medications   Medication Sig Start Date End Date Taking? Authorizing Provider  aspirin EC 81 MG EC tablet Take 1 tablet (81 mg total) by mouth daily. 09/20/13  Yes Dayna N Dunn, PA-C  atorvastatin (LIPITOR) 80 MG tablet Take 1 tablet (80 mg total) by mouth every evening. 07/11/14  Yes Alycia Rossetti, MD  cinacalcet (SENSIPAR) 90 MG tablet Take 1 tablet (90 mg total) by mouth daily. 06/18/14  Yes Alycia Rossetti, MD  dicyclomine (BENTYL) 10 MG capsule Take 1 capsule (10 mg total) by mouth 4 (four) times daily -  before meals and at bedtime. 06/18/14  Yes Alycia Rossetti, MD  diphenoxylate-atropine (LOMOTIL) 2.5-0.025 MG per tablet TAKE ONE TABLET BY MOUTH 4 TIMES DAILY AS NEEDED FOR LOOSE STOOLS. 08/20/14  Yes Alycia Rossetti, MD  labetalol (NORMODYNE) 200 MG tablet Take 1 tablet (200 mg total) by mouth 2 (two) times daily. 06/18/14  Yes Alycia Rossetti, MD  meclizine (ANTIVERT) 25 MG tablet Take 1 tablet (25 mg total) by mouth 3 (three) times daily as needed for dizziness. 06/18/14  Yes Alycia Rossetti, MD  methocarbamol (ROBAXIN) 500 MG tablet Take 500 mg by mouth every 8 (eight) hours  as needed for muscle spasms.    Yes Historical Provider, MD  midodrine (PROAMATINE) 5 MG tablet Take 1 tablet (5 mg total) by mouth 3 (three) times daily with meals. 06/18/14  Yes Alycia Rossetti, MD  multivitamin (RENA-VIT) TABS tablet Take 1 tablet by mouth at bedtime. Rena-Vit.  09/20/13  Yes Dayna N Dunn, PA-C  nitroGLYCERIN (NITROSTAT) 0.4 MG SL tablet Place 1 tablet (0.4 mg total) under the tongue every 5 (five) minutes as needed for chest pain (up to 3 doses). 09/20/13  Yes Dayna N Dunn, PA-C  oxyCODONE-acetaminophen (PERCOCET) 10-325 MG per tablet Take 1 tablet by mouth 2 (two) times daily as needed for pain. 09/16/14  Yes Alycia Rossetti, MD  sevelamer carbonate (RENVELA) 800 MG tablet Take 4 tablets with meals and 2 tablets with snacks. Patient taking differently: Take 1,600 mg by mouth 2 (two) times daily.  06/18/14  Yes Alycia Rossetti, MD  ticagrelor (BRILINTA) 90 MG TABS tablet Take 1 tablet (90 mg total) by mouth 2 (two) times daily. 06/18/14  Yes Alycia Rossetti, MD  blood glucose meter kit and supplies KIT Dispense based on patient and insurance preference. Use up to monitor FSBS 1x daily. Dx: E11.9. Patient not taking: Reported on 09/20/2014 06/18/14   Alycia Rossetti, MD  Blood Glucose Monitoring Suppl (BLOOD GLUCOSE METER KIT AND SUPPLIES) Dispense based on patient and insurance preference. Use to monitor FSBS 1x daily. Dx: E11.9. Please dispense lancets and test strips, #50. Patient not taking: Reported on 09/15/2014 03/25/14   Alycia Rossetti, MD   No Known Allergies  FAMILY HISTORY:  indicated that his mother is deceased. He indicated that his father is deceased.  SOCIAL HISTORY:  reports that he has never smoked. He has never used smokeless tobacco. He reports that he does not drink alcohol or use illicit drugs.  REVIEW OF SYSTEMS:  No headache, no N/V, no abd pain, no chest pain. Has some right arm pain. Feels slightly weak. Had a little diarrhea. No productive cough. All other systems negative.  SUBJECTIVE:   VITAL SIGNS: Temp:  [96.4 F (35.8 C)-98.5 F (36.9 C)] 97.6 F (36.4 C) (05/13 1825) Pulse Rate:  [66-113] 75 (05/13 1840) Resp:  [9-21] 10 (05/13 1845) BP: (66-160)/(25-142) 124/75 mmHg (05/13 1845) SpO2:  [90 %-100 %] 100 % (05/13  1840) Weight:  [105 kg (231 lb 7.7 oz)-108.863 kg (240 lb)] 105 kg (231 lb 7.7 oz) (05/13 1845) HEMODYNAMICS:   VENTILATOR SETTINGS:   INTAKE / OUTPUT:  Intake/Output Summary (Last 24 hours) at 09/20/14 1910 Last data filed at 09/20/14 1845  Gross per 24 hour  Intake 412.07 ml  Output     30 ml  Net 382.07 ml    PHYSICAL EXAMINATION: General:  Awake, alert, oriented to person and place Neuro:  Both arms move equally, no facial droop, tongue midline, speech normal HEENT:  Atraumatic, eyes open and pupils are equal, no stridor Cardiovascular:  RRR, no loud murmur Lungs:  No wheeze, mild intermittent basilar rales that resolves with deep breaths Abdomen:  Globular, soft, nontender, no guarding Musculoskeletal:  S/p Bilateral AKA, right hand has a heavy dressing s/p  Skin:  Stage 2 pressure ulcer on sacral area, fistula right arm looks clean  LABS:  CBC  Recent Labs Lab 09/20/14 0636  WBC 18.3*  HGB 12.3*  HCT 36.8*  PLT 98*   Coag's No results for input(s): APTT, INR in the last 168 hours. BMET  Recent Labs Lab  09/20/14 0636  NA 135  K 5.7*  CL 95*  CO2 18*  BUN 79*  CREATININE 10.04*  GLUCOSE 226*   Electrolytes  Recent Labs Lab 09/20/14 0636  CALCIUM 9.7   Sepsis Markers  Recent Labs Lab 09/20/14 0830 09/20/14 1015  LATICACIDVEN 3.1* 3.1*   ABG No results for input(s): PHART, PCO2ART, PO2ART in the last 168 hours. Liver Enzymes  Recent Labs Lab 09/20/14 0636  AST 76*  ALT 17  ALKPHOS 146*  BILITOT 3.3*  ALBUMIN 3.0*   Cardiac Enzymes No results for input(s): TROPONINI, PROBNP in the last 168 hours. Glucose  Recent Labs Lab 09/20/14 0743 09/20/14 1144 09/20/14 1255 09/20/14 1403 09/20/14 1624 09/20/14 1829  GLUCAP 229* 163* 123* 144* 128* 117*    Imaging Dg Chest 1 View  09/19/2014   CLINICAL DATA:  Patient fell out of wheelchair this morning. Altered level of consciousness.  EXAM: CHEST  1 VIEW  COMPARISON:  02/18/2014   FINDINGS: Shallow inspiration. Cardiac enlargement. Pulmonary vascularity is normal. No focal airspace disease or consolidation in the lungs. No blunting of costophrenic angles. No pneumothorax. Calcified and tortuous aorta. Mediastinal contours appear intact. Degenerative changes in the shoulders. Vascular graft in the left arm.  IMPRESSION: Cardiac enlargement.  No evidence of active pulmonary disease.   Electronically Signed   By: Lucienne Capers M.D.   On: 09/19/2014 05:31   Dg Pelvis 1-2 Views  09/19/2014   CLINICAL DATA:  Patient fell out of wheelchair. Altered level of consciousness.  EXAM: PELVIS - 1-2 VIEW  COMPARISON:  None.  FINDINGS: Diffuse bone demineralization. Extensive vascular calcifications. Pelvis and hips appear intact. No acute displaced fractures are identified. SI joints and symphysis pubis are not displaced.  IMPRESSION: No acute bony abnormalities.   Electronically Signed   By: Lucienne Capers M.D.   On: 09/19/2014 05:33   Ct Head Wo Contrast  09/19/2014   CLINICAL DATA:  Initial evaluation for acute trauma, fall.  EXAM: CT HEAD WITHOUT CONTRAST  TECHNIQUE: Contiguous axial images were obtained from the base of the skull through the vertex without intravenous contrast.  COMPARISON:  Prior study from 01/21/2012  FINDINGS: Diffuse prominence of the CSF containing spaces is compatible with generalized cerebral atrophy. Encephalomalacia within the right occipital lobe consistent with remote right PCA territory infarct, stable. Chronic small vessel ischemic changes noted. Prominent vascular calcifications noted.  No acute large vessel territory infarct. No intracranial hemorrhage. No extra-axial fluid collection.  No mass lesion or midline shift.  No hydrocephalus.  Scalp soft tissues within normal limits. No acute abnormality about the orbits.  Calvarium intact.  Probable retention cyst present within the left maxillary sinus. Scattered mucosal thickening present throughout the  visualized paranasal sinuses. No air-fluid levels to suggest active sinus infection. Mastoid air cells are clear.  IMPRESSION: 1. No acute intracranial process. 2. Remote right PCA territory infarct. 3. Generalized cerebral atrophy with chronic microvascular ischemic disease.   Electronically Signed   By: Jeannine Boga M.D.   On: 09/19/2014 05:24     ASSESSMENT / PLAN:  PULMONARY OETT 5/13 >> 5/13 (for sgy) A: No acute issues P:   Continue good pulmonary hygiene Incentive spirometry as able, elevate head of bed  CARDIOVASCULAR CVL A: Shock, likely septic shock Severe PVD CAD HTN Ischemic CM P:  Careful volume resuscitation given renal status Dopamine running via peripheral; attempt to wean quickly. If able then should be able to defer central access Hold labetalol Restart home midodrine Restart  ASA and Brilenta, statin when OK with surgery Careful with narcs   RENAL A:  ESRD on HD Hyperkalemia  P:   Renal service following Recheck BMP this pm and in am Likely restart sensipar, MVI, sevelamer on 5/14 Might get HD tonight  GASTROINTESTINAL A:  Chronic diarrhea P:   Restart bentyl and lomotil when he is taking good PO, likely 5/14  HEMATOLOGIC A:  Leukocytosis  Anemia of chronic disease P:  Follow CBC  INFECTIOUS A:  R hand gas gangrene P:   BCx2 5/13 >>  UC 5/13 >>  R hand wound 5/13 >>   Vanco 5/13 >>  Zosyn 5/13 >>   S/p debridement 5/13, plan narrow abx based on cx data   ENDOCRINE A:  DM   P:   Start SSI protocol  NEUROLOGIC A:  No acute issues P:   Pain control, judicious w narcs   FAMILY  - Updates:   - Inter-disciplinary family meet or Palliative Care meeting due by:  5/20    TODAY'S SUMMARY: admitted for septic shock due severe right arm/hand gas gangrene s/p I&D, slightly hypotensive post op needing dopamine drip. ESRD on HD.   Critical Care time: 35 minutes  Al[facility] [place] Pager: [phone] 09/20/2014, 7:10 PM

## 2014-09-20 NOTE — Progress Notes (Signed)
ANTIBIOTIC CONSULT NOTE  Pharmacy Consult for Vancomycin Indication: Sepsis and cellulitis  No Known Allergies  Patient Measurements: Height: 5\' 10"  (177.8 cm) Weight: 240 lb (108.863 kg) IBW/kg (Calculated) : 73  Vital Signs: Temp: 96.4 F (35.8 C) (05/13 0646) Temp Source: Rectal (05/13 0646) BP: 97/66 mmHg (05/13 0930) Pulse Rate: 77 (05/13 0930) Intake/Output from previous day:   Intake/Output from this shift:    Labs:  Recent Labs  09/20/14 0636  WBC 18.3*  HGB 12.3*  PLT 98*  CREATININE 10.04*   Estimated Creatinine Clearance: 8.8 mL/min (by C-G formula based on Cr of 10.04). No results for input(s): VANCOTROUGH, VANCOPEAK, VANCORANDOM, GENTTROUGH, GENTPEAK, GENTRANDOM, TOBRATROUGH, TOBRAPEAK, TOBRARND, AMIKACINPEAK, AMIKACINTROU, AMIKACIN in the last 72 hours.   Microbiology: Recent Results (from the past 720 hour(s))  Culture, blood (routine x 2)     Status: None (Preliminary result)   Collection Time: 09/20/14  7:48 AM  Result Value Ref Range Status   Specimen Description BLOOD  Final   Special Requests NONE  Final   Culture NO GROWTH <24 HRS  Final   Report Status PENDING  Incomplete    Anti-infectives    Start     Dose/Rate Route Frequency Ordered Stop   09/20/14 0930  vancomycin (VANCOCIN) IVPB 1000 mg/200 mL premix     1,000 mg 200 mL/hr over 60 Minutes Intravenous  Once 09/20/14 0925     09/20/14 0730  vancomycin (VANCOCIN) IVPB 1000 mg/200 mL premix     1,000 mg 200 mL/hr over 60 Minutes Intravenous  Once 09/20/14 0722 09/20/14 0953   09/20/14 0730  piperacillin-tazobactam (ZOSYN) IVPB 3.375 g     3.375 g 100 mL/hr over 30 Minutes Intravenous  Once 09/20/14 0722 09/20/14 0853     Assessment: Okay for Protocol, received initial doses in the morning.  Plan to admit to [facility], initial doses ordered in ED.  Zosyn 3.375gm IV x 1 and a total of 2gm IV Vancomycin to be given.  Goal of Therapy:  Pre-Hemodialysis Vancomycin level goal range  =15-25 mcg/ml  Plan:  Zosyn 2.25gm IV every 8 hours. Follow-up micro data, labs, vitals. F/U HD schedule for further Vancomycin dosing. Measure antibiotic drug levels at steady state Follow up culture results  Pricilla Larsson 09/20/2014,9:58 AM
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
## 2014-09-20 NOTE — ED Notes (Signed)
Dressing to right hand, edematous.  Blister noted to middle and ring fingers to right hand, with bruise and redness to index, middle and ring fingers.

## 2014-09-20 NOTE — ED Notes (Signed)
Waiting on labs to start antibiotics.

## 2014-09-20 NOTE — ED Notes (Signed)
   09/20/14 0640  [place]  Intervention Call light w/in reach;Comfort measures;Introduced self to pt/other;Pain assessed;Stretcher locked in lowest position;Plan of care discussed with pt/other  Assessment Alert;Vital signs WDL;Patient comfortable;Patient resting  pt states he fell trying to get to the commode. Complaining of left hip pain. Pt was covered in stool upon arriving by EMS from home. Pt cleaned & new bed linens applied.

## 2014-09-20 NOTE — Anesthesia Postprocedure Evaluation (Signed)
  Anesthesia Post-op Note  Patient: Julian [name]  Procedure(s) Performed: Procedure(s): IRRIGATION AND DEBRIDEMENT RIGHT HAND (Right)  Patient Location: PACU  Anesthesia Type:General  Level of Consciousness: sedated, patient cooperative and responds to stimulation  Airway and Oxygen Therapy: Patient Spontanous Breathing  Post-op Pain: none  Post-op Assessment: Post-op Vital signs reviewed, Patient's Cardiovascular Status Stable, Respiratory Function Stable, Patent Airway, No signs of Nausea or vomiting and Pain level controlled, requiring Dopamine for BP support with hypotension/necrotizing fasciitis   Post-op Vital Signs: Reviewed and stable on Dopamine  Last Vitals:  Filed Vitals:   09/20/14 1700  BP: 74/25  Pulse:   Temp:   Resp:     Complications: No apparent anesthesia complications.  Critical care assuming care of patient, ICU bed pending.

## 2014-09-20 NOTE — ED Notes (Signed)
Several attempts being made to obtain IV site and draw labs without success.

## 2014-09-20 NOTE — ED Notes (Signed)
Pt cbg [phone]

## 2014-09-20 NOTE — ED Provider Notes (Addendum)
Patient turned over to me. Patient with significant medical problems. Was hypothermic started on broad-spectrum antibiotics. 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 Patient also due for dialysis today. A potassium elevated 5.7. Sugar elevated in the 200s will be given some insulin which will help bring down the potassium. Patient normally dialyzed in his right arm. Primary will have to have a temporary catheter placed [place] think the one E dialyzed with the right arm. Normally dialyzed here but since he needs to be transferred to [facility] to see his hand surgeon Dr. Harlene Salts of nephrology there will be consult to by the hospitalist. Hospitals arranging transfer.  Findings in the right hand are consistent with gas gangrene. Pus draining from previous amputation site. Patient's of right thumb has some involvement but it's mostly the other 3 fingers that show most of the evidence of infection.. Marked swelling of the other fingers little fingers already been removed. That was back in March. Hand is also puffy as well x-ray shows evidence of gas.  Patient came in by EMS for fall x-rays of the lumbar back showed no evidence of acute injury. Also head CT shows no acute problems does show evidence of a remote infarct. Patient's chest x-ray negative no significant pulmonary edema.   In addition patient's urine shows evidence of infection. With the Zosyn and vancomycin started should help cover the hand infection as well as the urinary tract infection. Patient's hypothermia could've been related to early sepsis. Lactic acid is pending. CO2 though does show some acidosis.  Dr. Burney Gauze office contacted they're aware that the patient will be admitted to the hospitalist service [facility] he will see the patient in hand surgery consultation.   CRITICAL CARE Performed by: Fredia Sorrow Total critical care time: 30 Critical care time was exclusive of separately billable  procedures and treating other patients. Critical care was necessary to treat or prevent imminent or life-threatening deterioration. Critical care was time spent personally by me on the following activities: development of treatment plan with patient and/or surrogate as well as nursing, discussions with consultants, evaluation of patient's response to treatment, examination of patient, obtaining history from patient or surrogate, ordering and performing treatments and interventions, ordering and review of laboratory studies, ordering and review of radiographic studies, pulse oximetry and re-evaluation of patient's condition.    Results for orders placed or performed during the hospital encounter of 09/20/14  Culture, blood (routine x 2)  Result Value Ref Range   Specimen Description BLOOD    Special Requests NONE    Culture NO GROWTH <24 HRS    Report Status PENDING   Urinalysis, Routine w reflex microscopic  Result Value Ref Range   Color, Urine YELLOW YELLOW   APPearance TURBID (A) CLEAR   Specific Gravity, Urine 1.020 1.005 - 1.030   pH 7.0 5.0 - 8.0   Glucose, UA NEGATIVE NEGATIVE mg/dL   Hgb urine dipstick LARGE (A) NEGATIVE   Bilirubin Urine SMALL (A) NEGATIVE   Ketones, ur NEGATIVE NEGATIVE mg/dL   Protein, ur >300 (A) NEGATIVE mg/dL   Urobilinogen, UA 0.2 0.0 - 1.0 mg/dL   Nitrite NEGATIVE NEGATIVE   Leukocytes, UA MODERATE (A) NEGATIVE  Comprehensive metabolic panel  Result Value Ref Range   Sodium 135 135 - 145 mmol/L   Potassium 5.7 (H) 3.5 - 5.1 mmol/L   Chloride 95 (L) 101 - 111 mmol/L   CO2 18 (L) 22 - 32 mmol/L   Glucose, Bld 226 (H) 65 -  99 mg/dL   BUN 79 (H) 6 - 20 mg/dL   Creatinine, Ser 10.04 (H) 0.61 - 1.24 mg/dL   Calcium 9.7 8.9 - 10.3 mg/dL   Total Protein 7.7 6.5 - 8.1 g/dL   Albumin 3.0 (L) 3.5 - 5.0 g/dL   AST 76 (H) 15 - 41 U/L   ALT 17 17 - 63 U/L   Alkaline Phosphatase 146 (H) 38 - 126 U/L   Total Bilirubin 3.3 (H) 0.3 - 1.2 mg/dL   GFR calc non  Af Amer 5 (L) >60 mL/min   GFR calc Af Amer 5 (L) >60 mL/min   Anion gap NOT CALCULATED 5 - 15  Urine microscopic-add on  Result Value Ref Range   WBC, UA TOO NUMEROUS TO COUNT <3 WBC/hpf   RBC / HPF TOO NUMEROUS TO COUNT <3 RBC/hpf   Bacteria, UA MANY (A) RARE  POC CBG, ED  Result Value Ref Range   Glucose-Capillary 229 (H) 65 - 99 mg/dL   Dg Chest 1 View  09/19/2014   CLINICAL DATA:  Patient fell out of wheelchair this morning. Altered level of consciousness.  EXAM: CHEST  1 VIEW  COMPARISON:  02/18/2014  FINDINGS: Shallow inspiration. Cardiac enlargement. Pulmonary vascularity is normal. No focal airspace disease or consolidation in the lungs. No blunting of costophrenic angles. No pneumothorax. Calcified and tortuous aorta. Mediastinal contours appear intact. Degenerative changes in the shoulders. Vascular graft in the left arm.  IMPRESSION: Cardiac enlargement.  No evidence of active pulmonary disease.   Electronically Signed   By: Lucienne Capers M.D.   On: 09/19/2014 05:31   Dg Lumbar Spine Complete  09/20/2014   CLINICAL DATA:  Fall.  Low back pain.  Right hand swelling.  EXAM: LUMBAR SPINE - COMPLETE 4+ VIEW  COMPARISON:  Lumbar spine radiographs 02/24/2013  FINDINGS: 5 non rib-bearing lumbar type vertebral bodies are present. Moderate osteopenia is noted. Vertebral body heights and alignment are maintained. Extensive atherosclerotic calcifications are present in the aorta and branch vessels without aneurysm. The bowel gas pattern is unremarkable. The pelvis is intact.  IMPRESSION: 1. No acute or focal abnormality of the lumbar spine. 2. Extensive atherosclerotic disease without evidence for aneurysm.   Electronically Signed   By: San Morelle M.D.   On: 09/20/2014 07:44   Dg Pelvis 1-2 Views  09/19/2014   CLINICAL DATA:  Patient fell out of wheelchair. Altered level of consciousness.  EXAM: PELVIS - 1-2 VIEW  COMPARISON:  None.  FINDINGS: Diffuse bone demineralization. Extensive  vascular calcifications. Pelvis and hips appear intact. No acute displaced fractures are identified. SI joints and symphysis pubis are not displaced.  IMPRESSION: No acute bony abnormalities.   Electronically Signed   By: Lucienne Capers M.D.   On: 09/19/2014 05:33   Ct Head Wo Contrast  09/20/2014   CLINICAL DATA:  Fall earlier today  EXAM: CT HEAD WITHOUT CONTRAST  TECHNIQUE: Contiguous axial images were obtained from the base of the skull through the vertex without intravenous contrast.  COMPARISON:  Sep 19, 2014  FINDINGS: Mild generalized atrophy is stable. There is slightly more cerebellar atrophy than elsewhere. There is no intracranial mass, hemorrhage, extra-axial fluid collection, or midline shift. There is evidence of a prior infarct in the medial right occipital lobe, stable. There is slight periventricular small vessel disease. No new gray-white compartment lesion is identified. No acute infarct evident. Basal ganglia calcification may be physiologic in this age group. The bony calvarium appears intact. The mastoid air  cells are clear. There is mucosal thickening in both maxillary antra with a retention cyst in the inferior left maxillary antrum. There is mucosal thickening of multiple ethmoid air cells. Mild mucosal thickening in both posterior sphenoid sinuses also noted.  IMPRESSION: Atrophy. Prior infarct medial right occipital lobe. Slight periventricular small vessel disease. No acute infarct apparent. No intracranial hemorrhage, mass, or extra-axial fluid. Paranasal sinus disease.   Electronically Signed   By: Lowella Grip III M.D.   On: 09/20/2014 07:18   Ct Head Wo Contrast  09/19/2014   CLINICAL DATA:  Initial evaluation for acute trauma, fall.  EXAM: CT HEAD WITHOUT CONTRAST  TECHNIQUE: Contiguous axial images were obtained from the base of the skull through the vertex without intravenous contrast.  COMPARISON:  Prior study from 01/21/2012  FINDINGS: Diffuse prominence of the CSF  containing spaces is compatible with generalized cerebral atrophy. Encephalomalacia within the right occipital lobe consistent with remote right PCA territory infarct, stable. Chronic small vessel ischemic changes noted. Prominent vascular calcifications noted.  No acute large vessel territory infarct. No intracranial hemorrhage. No extra-axial fluid collection.  No mass lesion or midline shift.  No hydrocephalus.  Scalp soft tissues within normal limits. No acute abnormality about the orbits.  Calvarium intact.  Probable retention cyst present within the left maxillary sinus. Scattered mucosal thickening present throughout the visualized paranasal sinuses. No air-fluid levels to suggest active sinus infection. Mastoid air cells are clear.  IMPRESSION: 1. No acute intracranial process. 2. Remote right PCA territory infarct. 3. Generalized cerebral atrophy with chronic microvascular ischemic disease.   Electronically Signed   By: Jeannine Boga M.D.   On: 09/19/2014 05:24   Dg Hand Complete Right  09/20/2014   CLINICAL DATA:  Fall. Swelling of the right hand. Amputation of the fifth digit 2 months ago.  EXAM: Three views of the right hand.  COMPARISON:  Right hand radiographs 07/07/2014  FINDINGS: Amputation is noted at the proximal aspect of the fifth metacarpal. There is marked soft tissue swelling with gas in the long finger and ring finger. Extensive dorsal soft tissue swelling is noted over the hand diffuse subcutaneous gas centered over the first and second metacarpals. No definite bone erosion is evident. There is moderate osteopenia. Extensive small vessel calcifications are noted.  IMPRESSION: 1. Extensive soft tissue swelling with gas over the dorsal aspect of the hand extending into the digits compatible with cellulitis. This does not appear to be related to the acute event. 2. Extensive soft tissue swelling extends into all 4 residual digits. 3. Amputation of the fifth ray at the level of the  metacarpal. 4. Diffuse microvascular calcifications compatible with diabetes.   Electronically Signed   By: San Morelle M.D.   On: 09/20/2014 07:51   Dg Hip Unilat With Pelvis 1v Left  09/20/2014   CLINICAL DATA:  Fall. Left hip pain. The patient fell at home trying to get to the commode.  EXAM: LEFT HIP (WITH PELVIS) 1 VIEW  COMPARISON:  None.  FINDINGS: Moderate osteopenia is present. The left hip is located. No acute fracture or dislocation is evident. Extensive atherosclerotic calcifications are present. The pelvis is intact.  IMPRESSION: 1. No acute fracture of the left hip. 2. Moderate osteopenia. 3. Extensive atherosclerotic disease is again noted.   Electronically Signed   By: San Morelle M.D.   On: 09/20/2014 07:46      Fredia Sorrow, MD 09/20/14 [id number]  Fredia Sorrow, MD 09/20/14 [id number]  Fredia Sorrow, MD 09/20/14  [place], MD 09/20/14 [phone]  Addendum:  No beds available at [facility] so patient will be transferred to emergency department at [facility]. Dr. Mariane Baumgarten emergency medicine notified. Hospitalist 6 receiving patient on that and [facility]a aware and hospitalist transferring from this and Dr. Sherrian Divers also aware. The hand surgeon's office also aware the patient will be going to the ED. [facility] aware and will be coming to get the patient.  Fredia Sorrow, MD 09/20/14 [phone]
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
## 2014-09-20 NOTE — Op Note (Signed)
[id number] 

## 2014-09-20 NOTE — Anesthesia Preprocedure Evaluation (Addendum)
Anesthesia Evaluation  Patient identified by MRN, date of birth, ID band Patient awake    Reviewed: Allergy & Precautions, H&P , NPO status , Patient's Chart, lab work & pertinent test results  History of Anesthesia Complications Negative for: history of anesthetic complications  Airway Mallampati: II  TM Distance: >3 FB Neck ROM: Full    Dental  (+) Poor Dentition, Dental Advidsory Given, Loose, Chipped, Missing   Pulmonary neg pulmonary ROS,  breath sounds clear to auscultation        Cardiovascular hypertension, Pt. on medications - angina+ CAD, + Past MI, + Cardiac Stents ('15 NSTEMI) and + Peripheral Vascular Disease + dysrhythmias Rhythm:Regular Rate:Normal  09/2013 TTE: Left ventricle: The cavity size was normal. Wall thickness was increased in a pattern of mild LVH. Systolic function was mildly to moderately reduced. The estimated ejection fraction was in the range of 40% to 45%. Doppler parameters are consistent with a reversible restrictive pattern, indicative of decreased left ventricular diastolic compliance and/or increased left atrial pressure (grade 3 diastolic dysfunction). - Aortic valve: Valve area: 1.76cm^2(VTI). Valve area: 1.8cm^2 (Vmax). - Mitral valve: Calcified annulus. Mildly thickened leaflets . Valve area by continuity equation (using LVOT flow): 1.22cm^2. - Left atrium: The atrium was mildly dilated.    Neuro/Psych negative neurological ROS     GI/Hepatic negative GI ROS, Neg liver ROS,   Endo/Other  diabetes (glu 123), Type 2Morbid obesity  Renal/GU ESRF and DialysisRenal disease (MWF dialysis, K+ 5.7)     Musculoskeletal   Abdominal (+) + obese,   Peds  Hematology  (+) anemia , Plts 98,000 Hgb 12.3   Anesthesia Other Findings   Reproductive/Obstetrics                           Anesthesia Physical  Anesthesia Plan  ASA: III and  emergent  Anesthesia Plan: General   Post-op Pain Management:    Induction: Intravenous  Airway Management Planned: Oral ETT and Video Laryngoscope Planned  Additional Equipment:   Intra-op Plan:   Post-operative Plan: Extubation in OR  Informed Consent: I have reviewed the patients History and Physical, chart, labs and discussed the procedure including the risks, benefits and alternatives for the proposed anesthesia with the patient or authorized representative who has indicated his/her understanding and acceptance.   Dental advisory given  Plan Discussed with: CRNA and Surgeon  Anesthesia Plan Comments: (Plan routine monitors, GETA with VideoGlide intubation (due to very poor, fragile dentition))       Anesthesia Quick Evaluation

## 2014-09-20 NOTE — Anesthesia Procedure Notes (Signed)
Procedure Name: Intubation Date/Time: 09/20/2014 2:21 PM Performed by: Jenne Campus Pre-anesthesia Checklist: Patient identified, Emergency Drugs available, Suction available, Patient being monitored and Timeout performed Patient Re-evaluated:Patient Re-evaluated prior to inductionOxygen Delivery Method: Circle system utilized Preoxygenation: Pre-oxygenation with 100% oxygen Intubation Type: IV induction Ventilation: Mask ventilation without difficulty Grade View: Grade I Tube type: Oral Tube size: 7.5 mm Number of attempts: 1 Airway Equipment and Method: Video-laryngoscopy and Rigid stylet Placement Confirmation: positive ETCO2,  CO2 detector and breath sounds checked- equal and bilateral Secured at: 22 cm Tube secured with: Tape Dental Injury: Teeth and Oropharynx as per pre-operative assessment

## 2014-09-20 NOTE — Telephone Encounter (Signed)
Pt admitted due to sepsis

## 2014-09-21 DIAGNOSIS — L03113 Cellulitis of right upper limb: Secondary | ICD-10-CM

## 2014-09-21 DIAGNOSIS — L0390 Cellulitis, unspecified: Secondary | ICD-10-CM

## 2014-09-21 DIAGNOSIS — Z992 Dependence on renal dialysis: Secondary | ICD-10-CM

## 2014-09-21 DIAGNOSIS — N186 End stage renal disease: Secondary | ICD-10-CM

## 2014-09-21 DIAGNOSIS — A419 Sepsis, unspecified organism: Principal | ICD-10-CM

## 2014-09-21 DIAGNOSIS — E118 Type 2 diabetes mellitus with unspecified complications: Secondary | ICD-10-CM

## 2014-09-21 LAB — URINE CULTURE
CULTURE: NO GROWTH
Colony Count: NO GROWTH

## 2014-09-21 LAB — COMPREHENSIVE METABOLIC PANEL
ALK PHOS: 136 U/L — AB (ref 38–126)
ALT: 16 U/L — ABNORMAL LOW (ref 17–63)
ANION GAP: 16 — AB (ref 5–15)
AST: 79 U/L — AB (ref 15–41)
Albumin: 2.4 g/dL — ABNORMAL LOW (ref 3.5–5.0)
BILIRUBIN TOTAL: 2.8 mg/dL — AB (ref 0.3–1.2)
BUN: 56 mg/dL — AB (ref 6–20)
CALCIUM: 8.8 mg/dL — AB (ref 8.9–10.3)
CO2: 23 mmol/L (ref 22–32)
Chloride: 96 mmol/L — ABNORMAL LOW (ref 101–111)
Creatinine, Ser: 7.54 mg/dL — ABNORMAL HIGH (ref 0.61–1.24)
GFR calc non Af Amer: 7 mL/min — ABNORMAL LOW (ref >60)
GFR, EST AFRICAN AMERICAN: 8 mL/min — AB (ref >60)
GLUCOSE: 101 mg/dL — AB (ref 65–99)
Potassium: 4 mmol/L (ref 3.5–5.1)
Sodium: 135 mmol/L (ref 135–145)
TOTAL PROTEIN: 7.5 g/dL (ref 6.5–8.1)

## 2014-09-21 LAB — CBC
HEMATOCRIT: 38.6 % — AB (ref 39.0–52.0)
HEMOGLOBIN: 13.4 g/dL (ref 13.0–17.0)
MCH: 30.6 pg (ref 26.0–34.0)
MCHC: 34.7 g/dL (ref 30.0–36.0)
MCV: 88.1 fL (ref 78.0–100.0)
Platelets: 82 10*3/uL — ABNORMAL LOW (ref 150–400)
RBC: 4.38 MIL/uL (ref 4.22–5.81)
RDW: 16.7 % — ABNORMAL HIGH (ref 11.5–15.5)
WBC: 16.3 10*3/uL — ABNORMAL HIGH (ref 4.0–10.5)

## 2014-09-21 LAB — MAGNESIUM: MAGNESIUM: 2 mg/dL (ref 1.7–2.4)

## 2014-09-21 LAB — LACTIC ACID, PLASMA: Lactic Acid, Venous: 3.1 mmol/L (ref 0.5–2.0)

## 2014-09-21 LAB — GLUCOSE, CAPILLARY
GLUCOSE-CAPILLARY: 99 mg/dL (ref 65–99)
GLUCOSE-CAPILLARY: 147 mg/dL — AB (ref 65–99)
Glucose-Capillary: 125 mg/dL — ABNORMAL HIGH (ref 65–99)
Glucose-Capillary: 117 mg/dL — ABNORMAL HIGH (ref 65–99)

## 2014-09-21 LAB — HEPATITIS B SURFACE ANTIGEN: Hepatitis B Surface Ag: NEGATIVE

## 2014-09-21 LAB — HEPATITIS B SURFACE ANTIBODY,QUALITATIVE: HEP B S AB: POSITIVE — AB

## 2014-09-21 LAB — PHOSPHORUS: Phosphorus: 5.9 mg/dL — ABNORMAL HIGH (ref 2.5–4.6)

## 2014-09-21 MED ORDER — VANCOMYCIN HCL IN DEXTROSE 1-5 GM/200ML-% IV SOLN
1000.0000 mg | Freq: Once | INTRAVENOUS | Status: AC
  Administered 2014-09-21: 1000 mg via INTRAVENOUS
  Filled 2014-09-21: qty 200

## 2014-09-21 MED ORDER — VANCOMYCIN HCL IN DEXTROSE 1-5 GM/200ML-% IV SOLN
1000.0000 mg | INTRAVENOUS | Status: DC
  Administered 2014-09-23: 1000 mg via INTRAVENOUS
  Filled 2014-09-21 (×3): qty 200

## 2014-09-21 MED ORDER — MIDODRINE HCL 5 MG PO TABS
10.0000 mg | ORAL_TABLET | Freq: Three times a day (TID) | ORAL | Status: DC
  Administered 2014-09-21 – 2014-09-30 (×23): 10 mg via ORAL
  Filled 2014-09-21 (×32): qty 2

## 2014-09-21 MED ORDER — SODIUM CHLORIDE 0.9 % IV BOLUS (SEPSIS)
500.0000 mL | Freq: Once | INTRAVENOUS | Status: AC
  Administered 2014-09-21: 500 mL via INTRAVENOUS

## 2014-09-21 MED ORDER — MUPIROCIN 2 % EX OINT
1.0000 "application " | TOPICAL_OINTMENT | Freq: Two times a day (BID) | CUTANEOUS | Status: AC
  Administered 2014-09-21 – 2014-09-25 (×9): 1 via NASAL
  Filled 2014-09-21: qty 22

## 2014-09-21 MED ORDER — CETYLPYRIDINIUM CHLORIDE 0.05 % MT LIQD
7.0000 mL | Freq: Two times a day (BID) | OROMUCOSAL | Status: DC
  Administered 2014-09-21 – 2014-09-24 (×6): 7 mL via OROMUCOSAL

## 2014-09-21 MED ORDER — CHLORHEXIDINE GLUCONATE CLOTH 2 % EX PADS
6.0000 | MEDICATED_PAD | Freq: Every day | CUTANEOUS | Status: AC
  Administered 2014-09-21 – 2014-09-25 (×5): 6 via TOPICAL

## 2014-09-21 MED ORDER — PHENYLEPHRINE HCL 10 MG/ML IJ SOLN
30.0000 ug/min | INTRAVENOUS | Status: DC
  Administered 2014-09-21: 30 ug/min via INTRAVENOUS
  Administered 2014-09-21: 50 ug/min via INTRAVENOUS
  Filled 2014-09-21 (×3): qty 1

## 2014-09-21 NOTE — Progress Notes (Signed)
ANTIBIOTIC CONSULT NOTE  Pharmacy Consult for Vancomycin, Zosyn Indication: Sepsis and cellulitis  No Known Allergies  Patient Measurements: Height: 5\' 10"  (177.8 cm) Weight: 225 lb 15.5 oz (102.5 kg) (minus four pillows ) IBW/kg (Calculated) : 73  Vital Signs: Temp: 98.2 F (36.8 C) (05/14 0829) Temp Source: Oral (05/14 0829) BP: 108/86 mmHg (05/14 0845) Pulse Rate: 125 (05/14 0845) Intake/Output from previous day: 05/13 0701 - 05/14 0700 In: 674.3 [I.V.:574.3; IV Piggyback:100] Out: 2643 [Blood:30] Intake/Output from this shift: Total I/O In: 19.8 [I.V.:19.8] Out: -   Labs:  Recent Labs  09/20/14 0636 09/20/14 1930 09/21/14 0425 09/21/14 0426  WBC 18.3* 15.5* 16.3*  --   HGB 12.3* 12.7* 13.4  --   PLT 98* 89* 82*  --   CREATININE 10.04* 10.35*  10.21*  --  7.54*   Estimated Creatinine Clearance: 11.4 mL/min (by C-G formula based on Cr of 7.54). No results for input(s): VANCOTROUGH, VANCOPEAK, VANCORANDOM, GENTTROUGH, GENTPEAK, GENTRANDOM, TOBRATROUGH, TOBRAPEAK, TOBRARND, AMIKACINPEAK, AMIKACINTROU, AMIKACIN in the last 72 hours.   Microbiology: Recent Results (from the past 720 hour(s))  Culture, blood (routine x 2)     Status: None (Preliminary result)   Collection Time: 09/20/14  7:48 AM  Result Value Ref Range Status   Specimen Description BLOOD  Final   Special Requests NONE  Final   Culture NO GROWTH <24 HRS  Final   Report Status PENDING  Incomplete  Anaerobic culture     Status: None (Preliminary result)   Collection Time: 09/20/14  2:46 PM  Result Value Ref Range Status   Specimen Description ABSCESS  Final   Special Requests RIGHT HAND PT ON VACOMYCIN ZOSYN  Final   Gram Stain   Final    FEW WBC PRESENT, PREDOMINANTLY MONONUCLEAR NO SQUAMOUS EPITHELIAL CELLS SEEN ABUNDANT GRAM POSITIVE COCCI IN PAIRS IN CLUSTERS ABUNDANT GRAM NEGATIVE RODS Performed at [facility]    Culture PENDING  Incomplete   Report Status PENDING  Incomplete   Culture, routine-abscess     Status: None (Preliminary result)   Collection Time: 09/20/14  2:46 PM  Result Value Ref Range Status   Specimen Description ABSCESS  Final   Special Requests RIGHT HAND PT ON VANCOMYCIN ZOSYN  Final   Gram Stain   Final    FEW WBC PRESENT, PREDOMINANTLY MONONUCLEAR NO SQUAMOUS EPITHELIAL CELLS SEEN ABUNDANT GRAM POSITIVE COCCI IN PAIRS IN CLUSTERS ABUNDANT GRAM NEGATIVE RODS Performed at [facility]    Culture PENDING  Incomplete   Report Status PENDING  Incomplete  MRSA PCR Screening     Status: Abnormal   Collection Time: 09/20/14  6:45 PM  Result Value Ref Range Status   MRSA by PCR POSITIVE (A) NEGATIVE Final    Comment:        The GeneXpert MRSA Assay (FDA approved for NASAL specimens only), is one component of a comprehensive MRSA colonization surveillance program. It is not intended to diagnose MRSA infection nor to guide or monitor treatment for MRSA infections. RESULT CALLED TO, READ BACK BY AND VERIFIED WITH: T WOODS RN [id number] 09/20/14 A BROWNING     Anti-infectives    Start     Dose/Rate Route Frequency Ordered Stop   09/21/14 1000  vancomycin (VANCOCIN) IVPB 1000 mg/200 mL premix     1,000 mg 200 mL/hr over 60 Minutes Intravenous  Once 09/21/14 0925     09/20/14 2200  piperacillin-tazobactam (ZOSYN) IVPB 2.25 g     2.25 g 100 mL/hr  over 30 Minutes Intravenous 3 times per day 09/20/14 1844     09/20/14 1407  vancomycin (VANCOCIN) 1 GM/200ML IVPB    Comments:  Vira Agar, Beth   : cabinet override      09/20/14 1407 09/21/14 0214   09/20/14 0930  vancomycin (VANCOCIN) IVPB 1000 mg/200 mL premix     1,000 mg 200 mL/hr over 60 Minutes Intravenous  Once 09/20/14 0925 09/20/14 1115   09/20/14 0730  vancomycin (VANCOCIN) IVPB 1000 mg/200 mL premix     1,000 mg 200 mL/hr over 60 Minutes Intravenous  Once 09/20/14 0722 09/20/14 1009   09/20/14 0730  piperacillin-tazobactam (ZOSYN) IVPB 3.375 g     3.375 g 100 mL/hr over 30  Minutes Intravenous  Once 09/20/14 0722 09/20/14 0853     Assessment: 78 YOM with BLE amputation and non-healing R hand would. S/p I&D on 5/13. Patient has a h/o ESRD on MWF HD. He received a loading dose of vancomycin in the ED yesterday after which he received HD late last night. WBC elevated at 16.3, Afebrile.    Goal of Therapy:  Pre-Hemodialysis Vancomycin level goal range =15-25 mcg/ml  Plan:  -Zosyn 2.25gm IV every 8 hours. -Vancomycin 1 gm IV Q HD on MWF. Give additional Vancomycin 1 gm IV today to make up for yesterday's HD  Follow-up micro data, labs, vitals. Measure antibiotic drug levels at steady state Follow up culture results  Albertina Parr, PharmD., BCPS Clinical Pharmacist Pager [phone]

## 2014-09-21 NOTE — Progress Notes (Signed)
Subjective: 1 Day Post-Op Procedure(s) (LRB): IRRIGATION AND DEBRIDEMENT RIGHT HAND (Right) Patient reports pain as minimal.    Objective: Vital signs in last 24 hours: Temp:  [97 F (36.1 C)-98.2 F (36.8 C)] 97.6 F (36.4 C) (05/14 1243) Pulse Rate:  [27-136] 90 (05/14 1500) Resp:  [0-27] 0 (05/14 1500) BP: (44-175)/(23-146) 66/43 mmHg (05/14 1409) SpO2:  [82 %-100 %] 100 % (05/14 1500) Weight:  [102.5 kg (225 lb 15.5 oz)-105 kg (231 lb 7.7 oz)] 102.5 kg (225 lb 15.5 oz) (05/14 0528)  Intake/Output from previous day: 05/13 0701 - 05/14 0700 In: 674.3 [I.V.:574.3; IV Piggyback:100] Out: 2643 [Blood:30] Intake/Output this shift: Total I/O In: 382.6 [I.V.:132.6; IV Piggyback:250] Out: -    Recent Labs  09/20/14 0636 09/20/14 1930 09/21/14 0425  HGB 12.3* 12.7* 13.4    Recent Labs  09/20/14 1930 09/21/14 0425  WBC 15.5* 16.3*  RBC 4.20* 4.38  HCT 37.0* 38.6*  PLT 89* 82*    Recent Labs  09/20/14 1930 09/21/14 0426  NA 136 135  K 5.1 4.0  CL 95* 96*  CO2 21* 23  BUN 85* 56*  CREATININE 10.35*  10.21* 7.54*  GLUCOSE 144* 101*  CALCIUM 9.5 8.8*   No results for input(s): LABPT, INR in the last 72 hours.  dressing clean/dry/intact.  malodorous.  long finger dusky with poor sensation.  index and ring with some sensation and capillary refill.  intact capillary refill and sensation in thumb.  no erythema proximal to dressing.  Assessment/Plan: 1 Day Post-Op Procedure(s) (LRB): IRRIGATION AND DEBRIDEMENT RIGHT HAND (Right) Plan return to OR tomorrow morning for repeat irrigation and debridement.  NPO after midnight.    Janet Decesare R 09/21/2014, 3:38 PM

## 2014-09-21 NOTE — Progress Notes (Signed)
Pt alert with periods of confusing but following commands. Marland Kitchen SBP 80's - 40's. Titrated Neo from 38mcg to 66mcg with no change in BP. Dr. Tamala Julian made aware.

## 2014-09-21 NOTE — Progress Notes (Signed)
PULMONARY / CRITICAL CARE MEDICINE   Name: Julian [name] MRN: [id number] DOB: 1945-11-13    ADMISSION DATE:  09/20/2014 CONSULTATION DATE:  09/20/14  REFERRING MD :  Dr Wyline Copas  CHIEF COMPLAINT:  Septic Shock  INITIAL PRESENTATION: 78 w ESRD on HD, severe PVD, DM. Transferred to [facility] for surgical debridement of R hand wound. He was hypotensive post-op and transferred to ICU on dopamine. PCCM to assume care until stabilized.   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, no acute findings R hand XR 5/13 >> dorsal swelling and gas, extending into 4 digits L hip XR 5/13 >> no acute fx, osteopenia L spine 5/13 >> no fx or acute abnormality  SIGNIFICANT EVENTS: R hand surgery debridement 5/13   VITAL SIGNS: Temp:  [97 F (36.1 C)-98.5 F (36.9 C)] 98.2 F (36.8 C) (05/14 0829) Pulse Rate:  [33-125] 89 (05/14 1030) Resp:  [0-24] 17 (05/14 1030) BP: (44-166)/(23-142) 61/38 mmHg (05/14 1030) SpO2:  [82 %-100 %] 93 % (05/14 1030) Weight:  [102.5 kg (225 lb 15.5 oz)-105 kg (231 lb 7.7 oz)] 102.5 kg (225 lb 15.5 oz) (05/14 0528) HEMODYNAMICS:   VENTILATOR SETTINGS:   INTAKE / OUTPUT:  Intake/Output Summary (Last 24 hours) at 09/21/14 1118 Last data filed at 09/21/14 1000  Gross per 24 hour  Intake 733.65 ml  Output   2643 ml  Net -1909.35 ml    PHYSICAL EXAMINATION: General:  Awake, alert, oriented to person and place Neuro:  Both arms move equally, no facial droop, tongue midline, speech normal HEENT:  Atraumatic, eyes open and pupils are equal, no stridor Cardiovascular:  RRR, no loud murmur Lungs:  No wheeze, mild intermittent basilar rales that resolves with deep breaths Abdomen:  Globular, soft, nontender, no guarding Musculoskeletal:  S/p Bilateral AKA, right hand has a heavy dressing s/p  Skin:  Stage 2 pressure ulcer on sacral area, fistula right arm looks clean  LABS:  CBC  Recent Labs Lab 09/20/14 0636 09/20/14 1930 09/21/14 0425  WBC 18.3*  15.5* 16.3*  HGB 12.3* 12.7* 13.4  HCT 36.8* 37.0* 38.6*  PLT 98* 89* 82*   Coag's No results for input(s): APTT, INR in the last 168 hours. BMET  Recent Labs Lab 09/20/14 0636 09/20/14 1930 09/21/14 0426  NA 135 136 135  K 5.7* 5.1 4.0  CL 95* 95* 96*  CO2 18* 21* 23  BUN 79* 85* 56*  CREATININE 10.04* 10.35*  10.21* 7.54*  GLUCOSE 226* 144* 101*   Electrolytes  Recent Labs Lab 09/20/14 0636 09/20/14 1930 09/21/14 0426  CALCIUM 9.7 9.5 8.8*  MG  --   --  2.0  PHOS  --  8.4* 5.9*   Sepsis Markers  Recent Labs Lab 09/20/14 0830 09/20/14 1015  LATICACIDVEN 3.1* 3.1*   ABG No results for input(s): PHART, PCO2ART, PO2ART in the last 168 hours. Liver Enzymes  Recent Labs Lab 09/20/14 0636 09/20/14 1930 09/21/14 0426  AST 76*  --  79*  ALT 17  --  16*  ALKPHOS 146*  --  136*  BILITOT 3.3*  --  2.8*  ALBUMIN 3.0* 2.4* 2.4*   Cardiac Enzymes No results for input(s): TROPONINI, PROBNP in the last 168 hours. Glucose  Recent Labs Lab 09/20/14 1255 09/20/14 1403 09/20/14 1624 09/20/14 1829 09/20/14 2135 09/21/14 0826  GLUCAP 123* 144* 128* 117* 100* 99    Imaging Dg Lumbar Spine Complete  09/20/2014   CLINICAL DATA:  Fall.  Low back pain.  Right hand swelling.  EXAM: LUMBAR SPINE - COMPLETE 4+ VIEW  COMPARISON:  Lumbar spine radiographs 02/24/2013  FINDINGS: 5 non rib-bearing lumbar type vertebral bodies are present. Moderate osteopenia is noted. Vertebral body heights and alignment are maintained. Extensive atherosclerotic calcifications are present in the aorta and branch vessels without aneurysm. The bowel gas pattern is unremarkable. The pelvis is intact.  IMPRESSION: 1. No acute or focal abnormality of the lumbar spine. 2. Extensive atherosclerotic disease without evidence for aneurysm.   Electronically Signed   By: San Morelle M.D.   On: 09/20/2014 07:44   Ct Head Wo Contrast  09/20/2014   CLINICAL DATA:  Fall earlier today  EXAM: CT  HEAD WITHOUT CONTRAST  TECHNIQUE: Contiguous axial images were obtained from the base of the skull through the vertex without intravenous contrast.  COMPARISON:  Sep 19, 2014  FINDINGS: Mild generalized atrophy is stable. There is slightly more cerebellar atrophy than elsewhere. There is no intracranial mass, hemorrhage, extra-axial fluid collection, or midline shift. There is evidence of a prior infarct in the medial right occipital lobe, stable. There is slight periventricular small vessel disease. No new gray-white compartment lesion is identified. No acute infarct evident. Basal ganglia calcification may be physiologic in this age group. The bony calvarium appears intact. The mastoid air cells are clear. There is mucosal thickening in both maxillary antra with a retention cyst in the inferior left maxillary antrum. There is mucosal thickening of multiple ethmoid air cells. Mild mucosal thickening in both posterior sphenoid sinuses also noted.  IMPRESSION: Atrophy. Prior infarct medial right occipital lobe. Slight periventricular small vessel disease. No acute infarct apparent. No intracranial hemorrhage, mass, or extra-axial fluid. Paranasal sinus disease.   Electronically Signed   By: Lowella Grip III M.D.   On: 09/20/2014 07:18   Dg Hand Complete Right  09/20/2014   CLINICAL DATA:  Fall. Swelling of the right hand. Amputation of the fifth digit 2 months ago.  EXAM: Three views of the right hand.  COMPARISON:  Right hand radiographs 07/07/2014  FINDINGS: Amputation is noted at the proximal aspect of the fifth metacarpal. There is marked soft tissue swelling with gas in the long finger and ring finger. Extensive dorsal soft tissue swelling is noted over the hand diffuse subcutaneous gas centered over the first and second metacarpals. No definite bone erosion is evident. There is moderate osteopenia. Extensive small vessel calcifications are noted.  IMPRESSION: 1. Extensive soft tissue swelling with gas  over the dorsal aspect of the hand extending into the digits compatible with cellulitis. This does not appear to be related to the acute event. 2. Extensive soft tissue swelling extends into all 4 residual digits. 3. Amputation of the fifth ray at the level of the metacarpal. 4. Diffuse microvascular calcifications compatible with diabetes.   Electronically Signed   By: San Morelle M.D.   On: 09/20/2014 07:51   Dg Hip Unilat With Pelvis 1v Left  09/20/2014   CLINICAL DATA:  Fall. Left hip pain. The patient fell at home trying to get to the commode.  EXAM: LEFT HIP (WITH PELVIS) 1 VIEW  COMPARISON:  None.  FINDINGS: Moderate osteopenia is present. The left hip is located. No acute fracture or dislocation is evident. Extensive atherosclerotic calcifications are present. The pelvis is intact.  IMPRESSION: 1. No acute fracture of the left hip. 2. Moderate osteopenia. 3. Extensive atherosclerotic disease is again noted.   Electronically Signed   By: San Morelle M.D.   On:  09/20/2014 07:46     ASSESSMENT / PLAN:  PULMONARY OETT 5/13 >> 5/13 (for sgy) A: No acute issues P:   Continue good pulmonary hygiene Incentive spirometry as able, elevate head of bed  CARDIOVASCULAR CVL A:  Shock, likely septic shock Severe PVD CAD HTN Ischemic CM P:  Careful volume resuscitation given renal status Dopamine running via peripheral; attempt to wean quickly. If able then should be able to defer central access Hold labetalol Restart home midodrine Restart ASA and Brilenta, statin when OK with surgery Careful with narcs   RENAL A:   ESRD on HD Hyperkalemia  P:   Renal service following Recheck BMP this pm and in am Likely restart sensipar, MVI, sevelamer on 5/14 Might get HD tonight  GASTROINTESTINAL A:   Chronic diarrhea P:   Restart bentyl and lomotil when he is taking good PO, likely 5/14  HEMATOLOGIC A:  Leukocytosis  Anemia of chronic disease P:  Follow  CBC  INFECTIOUS A:  R hand gas gangrene P:   BCx2 5/13 >>  UC 5/13 >>  R hand wound 5/13: abundant GPC pairs/clusters>>>  Vanco 5/13 >>  Zosyn 5/13 >>   S/p debridement 5/13, plan narrow abx based on cx data   ENDOCRINE A:  DM   P:   Start SSI protocol  NEUROLOGIC A:  No acute issues P:   Pain control, judicious w narcs   FAMILY  - Updates:   - Inter-disciplinary family meet or Palliative Care meeting due by:  5/20    TODAY'S SUMMARY: admitted for septic shock due severe right arm/hand gas gangrene s/p I&D, slightly hypotensive post op needing dopamine drip. ESRD on HD.

## 2014-09-21 NOTE — Op Note (Signed)
NAMESIMPSON, PAULOS NO.:  [id number]  MEDICAL RECORD NO.:  [id number]  LOCATION:  2M03C                        FACILITY:  [place]  PHYSICIAN:  Leanora Cover, MD        DATE OF BIRTH:  Sep 09, 1945  DATE OF PROCEDURE:  09/20/2014 DATE OF DISCHARGE:                              OPERATIVE REPORT   PREOPERATIVE DIAGNOSIS:  Right hand gas gangrene.  POSTOPERATIVE DIAGNOSIS:  Right hand gas gangrene.  PROCEDURE:   1. Incision and drainage of right hand dorsum including long finger including skin, subcutaneous tissues, and fascia. 2. Incision and drainage dorsum right ring finger including skin and subcutaneous tissues. 3. Incision and drainage dorsum right thumb including skin and subcutaneous tissues. 4. Incision and drainage volar long finger including skin, subcutaneous tissues, and fascia. 5. Incision and drainage volar index finger including skin and subcutaneous tissues. 6. Incision and drainage volar ring finger including skin, subcutaneous tissues, and fascia. 7. Irrigation and debridement small finger ray amputation wound including skin, subcutaneous tissues, and fascia.  SURGEON:  Leanora Cover, MD  ASSISTANT:  None.  ANESTHESIA:  General.  IV FLUIDS:  Per Anesthesia flow sheet.  ESTIMATED BLOOD LOSS:  Minimal.  COMPLICATIONS:  None.  SPECIMENS:  Cultures to micro.  TOURNIQUET TIME:  77 minutes.  DISPOSITION:  Stable to PACU.  INDICATIONS:  Julian [name] is a 69 year old male, who underwent a right small finger ray amputation by Dr. Burney [name] for infection.  He had been doing well.  Over the past few days to a week, he has had increasing pain and swelling of the right hand.  He was taken to [facility] today where radiographs revealed subcutaneous air. He was transferred to [facility] for further care.  On examination, he has swollen, erythematous, and painful right hand both volarly and dorsally.  Recommended incision and  drainage.  Risks, benefits, and alternatives of surgery were discussed including the risk of blood loss; infection; damage to nerves, vessels, tendons, ligaments, bone; failure of surgery; need for additional surgery; complications with wound healing; continued pain, continued infection; need for irrigation and debridement and possible amputation.  He voiced understanding of these risks and elected to proceed.  OPERATIVE COURSE:  After being identified preoperatively by myself, the patient and I agreed upon procedure and site of procedure.  Surgical site was marked.  The risks, benefits, and alternatives of surgery were reviewed and he wished to proceed.  Surgical consent had been signed. He was transferred to the operating room and placed on the operating room table in supine position with the right upper extremity on arm board.  General anesthesia was induced by Anesthesiology.  He had been given vancomycin and Zosyn at [facility].  Surgical pause was performed between surgeons, anesthesia, and operating room staff, and all were in agreement as to the patient, procedure, site of procedure.  Tourniquet at the proximal aspect of the forearm was inflated to 250 mmHg after gravity exsanguination of the hand and forearm.  Incision was made on the dorsum of the hand between the index and long finger metacarpals. This was carried into subcutaneous tissues by spreading technique. Gross purulence was encountered.  The abscess  cavity coursed toward the thumb-index webspace.  Another incision was made over the dorsum of the thumb near the MP joint.  As this was where the cavity was tracking. There was no gross purulence over the thumb proximal phalanx and did not course more radially to this incision.  The incision ended up being extended into the distal aspect of the forearm following devitalized tissues.  There was significant amount of devitalized tissue in the dorsum of the hand including  skin and subcutaneous tissues.  The fourth dorsal compartment was open and there was no purulence within the compartment.  Combination of sharp debridement of the skin and subcutaneous tissues using the scissors as well as scraping with the curette and rongeurs was used.  Incision was made on the dorsum of the long finger, which was connected to the previous incision and an incision on the dorsum of the ring finger over the proximal phalanx. There was evidence of poor vascularity of these tissues, but no gross purulence in the ring finger.  The skin itself was hyperemic and purplish.  Attention was turned to the volar aspect of the hand.  There was epidermolysis.  This was all removed.  An incision was made over the MP joint of the long finger and carried into subcutaneous tissues by spreading technique.  There was gross purulence encountered.  There was again soft tissue necrosis.  The wound was extended both proximally and distally.  The radial and ulnar neurovascular bundles were identified and protected.  The wound was extended into the mid palm.  This was extended until no purulence was encountered.  Combination of sharp debridement of skin, subcutaneous tissues, and the palmar fascia and Ligament of Legueu and Juvara were sharply debrided with the scissors.  Rongeurs and curette were also used carefully.  Incision was made at the volar aspect of the ring finger.  Again, there was purulence and necrotic tissue, which was again debrided as above.  Care was taken to protect the radial and ulnar neurovascular bundles.  The wound of the small finger ray amputation was debrided.  There was gross purulence here.  This communicated with the areas of the volar infection of the long and ring fingers.  Devitalized tissue including skin and subcutaneous tissues again debrided sharply with the scissors.  The wound was extended in its previous incision proximally approximately 1-2 cm.  There was  no tracking of the purulence in this direction.  All wounds were copiously irrigated with 4000 mL of sterile saline by cysto tubing.  They were then packed open with quarter-inch iodoform [name].  Bipolar electrocautery was used throughout the case to obtain hemostasis.  The wounds were dressed with sterile 4x4s, ABDs, and wrapped with a Kerlix bandage.  A volar splint was placed and wrapped with Kerlix and Ace bandage.  Tourniquet was deflated at 77 minutes.  The index and ring finger had intact capillary refill.  The long finger was purplish in coloration as it was prior to the start of the case.  The thumb had good capillary refill.  The operative drapes were broken down.  The patient was awoken from anesthesia safely.  He was transferred back to stretcher and taken to PACU in stable condition.  He will be admitted to the Medicine for medical coverage dialysis and IV antibiotics.  He will likely need repeat irrigation debridement in 2-3 days.     Leanora Cover, MD     KK/MEDQ  D:  09/20/2014  T:  09/21/2014  Job:  [id number]

## 2014-09-21 NOTE — Progress Notes (Signed)
PULMONARY / CRITICAL CARE MEDICINE   Name: Bethany Cumming MRN: [id number] DOB: Jul 15, 1945    ADMISSION DATE:  09/20/2014 CONSULTATION DATE:  09/20/14  REFERRING MD :  Dr Wyline Copas  CHIEF COMPLAINT:  Septic Shock  INITIAL PRESENTATION:  13 w ESRD on HD, severe PVD, DM. Transferred to [facility] for surgical debridement of R hand wound. He was hypotensive post-op and transferred to ICU on dopamine. PCCM to assume care until stabilized.   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, no acute findings R hand XR 5/13 >> dorsal swelling and gas, extending into 4 digits L hip XR 5/13 >> no acute fx, osteopenia L spine 5/13 >> no fx or acute abnormality  SIGNIFICANT EVENTS: R hand surgery debridement 5/13   VITAL SIGNS: Temp:  [97 F (36.1 C)-98.5 F (36.9 C)] 98.2 F (36.8 C) (05/14 0829) Pulse Rate:  [33-125] 89 (05/14 1030) Resp:  [0-24] 17 (05/14 1030) BP: (44-166)/(23-142) 61/38 mmHg (05/14 1030) SpO2:  [82 %-100 %] 93 % (05/14 1030) Weight:  [102.5 kg (225 lb 15.5 oz)-105 kg (231 lb 7.7 oz)] 102.5 kg (225 lb 15.5 oz) (05/14 0528)  Room air  HEMODYNAMICS:   VENTILATOR SETTINGS:   INTAKE / OUTPUT:  Intake/Output Summary (Last 24 hours) at 09/21/14 1132 Last data filed at 09/21/14 1000  Gross per 24 hour  Intake 733.65 ml  Output   2643 ml  Net -1909.35 ml    PHYSICAL EXAMINATION: General:  Awake, alert, oriented to person and place. Still confused  Neuro:  Both arms move equally, no facial droop, tongue midline, speech normal HEENT:  Atraumatic, eyes open and pupils are equal, no stridor Cardiovascular:  RRR, no loud murmur Lungs:  No wheeze, decreased bases  Abdomen:  Globular, soft, nontender, no guarding Musculoskeletal:  S/p Bilateral AKA, right hand has a heavy dressing s/p  Skin:  Stage 2 pressure ulcer on sacral area, fistula right arm looks clean  LABS:  CBC  Recent Labs Lab 09/20/14 0636 09/20/14 1930 09/21/14 0425  WBC 18.3* 15.5* 16.3*  HGB  12.3* 12.7* 13.4  HCT 36.8* 37.0* 38.6*  PLT 98* 89* 82*   Coag's No results for input(s): APTT, INR in the last 168 hours. BMET  Recent Labs Lab 09/20/14 0636 09/20/14 1930 09/21/14 0426  NA 135 136 135  K 5.7* 5.1 4.0  CL 95* 95* 96*  CO2 18* 21* 23  BUN 79* 85* 56*  CREATININE 10.04* 10.35*  10.21* 7.54*  GLUCOSE 226* 144* 101*   Electrolytes  Recent Labs Lab 09/20/14 0636 09/20/14 1930 09/21/14 0426  CALCIUM 9.7 9.5 8.8*  MG  --   --  2.0  PHOS  --  8.4* 5.9*   Sepsis Markers  Recent Labs Lab 09/20/14 0830 09/20/14 1015  LATICACIDVEN 3.1* 3.1*   ABG No results for input(s): PHART, PCO2ART, PO2ART in the last 168 hours. Liver Enzymes  Recent Labs Lab 09/20/14 0636 09/20/14 1930 09/21/14 0426  AST 76*  --  79*  ALT 17  --  16*  ALKPHOS 146*  --  136*  BILITOT 3.3*  --  2.8*  ALBUMIN 3.0* 2.4* 2.4*   Cardiac Enzymes No results for input(s): TROPONINI, PROBNP in the last 168 hours. Glucose  Recent Labs Lab 09/20/14 1255 09/20/14 1403 09/20/14 1624 09/20/14 1829 09/20/14 2135 09/21/14 0826  GLUCAP 123* 144* 128* 117* 100* 99    Imaging Dg Lumbar Spine Complete  09/20/2014   CLINICAL DATA:  Fall.  Low  back pain.  Right hand swelling.  EXAM: LUMBAR SPINE - COMPLETE 4+ VIEW  COMPARISON:  Lumbar spine radiographs 02/24/2013  FINDINGS: 5 non rib-bearing lumbar type vertebral bodies are present. Moderate osteopenia is noted. Vertebral body heights and alignment are maintained. Extensive atherosclerotic calcifications are present in the aorta and branch vessels without aneurysm. The bowel gas pattern is unremarkable. The pelvis is intact.  IMPRESSION: 1. No acute or focal abnormality of the lumbar spine. 2. Extensive atherosclerotic disease without evidence for aneurysm.   Electronically Signed   By: San Morelle M.D.   On: 09/20/2014 07:44   Ct Head Wo Contrast  09/20/2014   CLINICAL DATA:  Fall earlier today  EXAM: CT HEAD WITHOUT CONTRAST   TECHNIQUE: Contiguous axial images were obtained from the base of the skull through the vertex without intravenous contrast.  COMPARISON:  Sep 19, 2014  FINDINGS: Mild generalized atrophy is stable. There is slightly more cerebellar atrophy than elsewhere. There is no intracranial mass, hemorrhage, extra-axial fluid collection, or midline shift. There is evidence of a prior infarct in the medial right occipital lobe, stable. There is slight periventricular small vessel disease. No new gray-white compartment lesion is identified. No acute infarct evident. Basal ganglia calcification may be physiologic in this age group. The bony calvarium appears intact. The mastoid air cells are clear. There is mucosal thickening in both maxillary antra with a retention cyst in the inferior left maxillary antrum. There is mucosal thickening of multiple ethmoid air cells. Mild mucosal thickening in both posterior sphenoid sinuses also noted.  IMPRESSION: Atrophy. Prior infarct medial right occipital lobe. Slight periventricular small vessel disease. No acute infarct apparent. No intracranial hemorrhage, mass, or extra-axial fluid. Paranasal sinus disease.   Electronically Signed   By: Lowella Grip III M.D.   On: 09/20/2014 07:18   Dg Hand Complete Right  09/20/2014   CLINICAL DATA:  Fall. Swelling of the right hand. Amputation of the fifth digit 2 months ago.  EXAM: Three views of the right hand.  COMPARISON:  Right hand radiographs 07/07/2014  FINDINGS: Amputation is noted at the proximal aspect of the fifth metacarpal. There is marked soft tissue swelling with gas in the long finger and ring finger. Extensive dorsal soft tissue swelling is noted over the hand diffuse subcutaneous gas centered over the first and second metacarpals. No definite bone erosion is evident. There is moderate osteopenia. Extensive small vessel calcifications are noted.  IMPRESSION: 1. Extensive soft tissue swelling with gas over the dorsal aspect  of the hand extending into the digits compatible with cellulitis. This does not appear to be related to the acute event. 2. Extensive soft tissue swelling extends into all 4 residual digits. 3. Amputation of the fifth ray at the level of the metacarpal. 4. Diffuse microvascular calcifications compatible with diabetes.   Electronically Signed   By: San Morelle M.D.   On: 09/20/2014 07:51   Dg Hip Unilat With Pelvis 1v Left  09/20/2014   CLINICAL DATA:  Fall. Left hip pain. The patient fell at home trying to get to the commode.  EXAM: LEFT HIP (WITH PELVIS) 1 VIEW  COMPARISON:  None.  FINDINGS: Moderate osteopenia is present. The left hip is located. No acute fracture or dislocation is evident. Extensive atherosclerotic calcifications are present. The pelvis is intact.  IMPRESSION: 1. No acute fracture of the left hip. 2. Moderate osteopenia. 3. Extensive atherosclerotic disease is again noted.   Electronically Signed   By: Wynetta Fines.D.  On: 09/20/2014 07:46     ASSESSMENT / PLAN:  PULMONARY OETT 5/13 >> 5/13 (for sgy) A:  No acute issues P:   Continue good pulmonary hygiene Incentive spirometry as able, elevate head of bed  CARDIOVASCULAR CVL A:  Shock, likely septic shock superimposed on underlying chronic hypotension; possibly exacerbated by negative volume status s/p HD Severe PVD CAD HTN Ischemic CM P:  Careful volume resuscitation given renal status Change dopamine to neo: Goal SBP > 80 Fluid challenge, then repeat lactic acid Hold labetalol Midodrine adjusted Restart ASA and Brilenta, statin when OK with surgery Careful with narcs   RENAL A:  ESRD on HD Hyperkalemia  P:   Renal service following restart sensipar, MVI, sevelamer on 5/14  GASTROINTESTINAL A:  Chronic diarrhea P:   Restart bentyl and lomotil  HEMATOLOGIC A:  Leukocytosis  Anemia of chronic disease P:  Follow CBC  INFECTIOUS A:  R hand gas gangrene P:   BCx2 5/13 >>  UC  5/13 >>  R hand wound 5/13 >> w/ gpc clusters and chains>>> Vanco 5/13 >>  Zosyn 5/13 >>  S/p debridement 5/13, plan narrow abx based on cx data   ENDOCRINE A:  DM   P:   Start SSI protocol  NEUROLOGIC A:  No acute issues P:   Pain control, judicious w narcs   FAMILY  - Updates:   - Inter-disciplinary family meet or Palliative Care meeting due by:  5/20    TODAY'S SUMMARY:  Still hypotensive. Chronically hypotensive at baseline. May be element of residual sepsis/SIRS, plus volume depletion s/p HD. He is on room air. Had almost 3 liters removed. Gets tachy on dopamine. Will change to neo, goal SBP >80, give bolus NS. Cont current abx and await culture. Should stay in ICU for another 24 hours.   Erick Colace ACNP-BC [place] Pager # [phone] OR # [phone] if no answer  Attending Note:  I have examined patient, reviewed labs, studies and notes. I have discussed the case with Jerrye Bushy, and I agree with the data and plans as amended above. Pt with hx ESRD presented w septic shock due to R hand wound. This has been debrided and he is hemodynamically improved. On my eval today he is interacting, has some mild intermittent agitation but is redirectable. We will give some additional IVF, wean pressors to off. He will likely be ready for transfer to SDU tomorrow. Independent critical care time is 35 minutes.   Baltazar Apo, MD, PhD 09/21/2014, 1:41 PM [facility] [phone] or if no answer [phone]

## 2014-09-21 NOTE — Progress Notes (Signed)
CRITICAL VALUE ALERT  Critical value received:  Lactic Acid 3.1  Date of notification:  09/21/14  Time of notification:  2224  Critical value read back:Yes.    Nurse who received alert:  Sanjuana Kava, RN  MD notified (1st page):  Dr. Tamala Julian Margaree Mackintosh)  Time of first page:  1714  MD notified (2nd page):  Time of second page:  Responding MD:  Spoke with Dr. Tamala Julian  Time MD responded:  [phone]

## 2014-09-21 NOTE — Progress Notes (Signed)
Subjective:  Went to ICU after surgery due to hypotension.  He is noted to have chronic hypotension as OP and is on midodrine- he is still on dopamine- but was able to get his HD last night overnight with 2600 removed.  Is sleepy this AM  Objective Vital signs in last 24 hours: Filed Vitals:   09/21/14 0645 09/21/14 0700 09/21/14 0715 09/21/14 0730  BP: 96/40 102/61 55/23 44/33   Pulse: 106 103 103 106  Temp:      TempSrc:      Resp: 17 13 10 11   Height:      Weight:      SpO2: 92% 96% 96% 95%   Weight change: -3.863 kg (-8 lb 8.3 oz)  Intake/Output Summary (Last 24 hours) at 09/21/14 0754 Last data filed at 09/21/14 0700  Gross per 24 hour  Intake 674.25 ml  Output   2643 ml  Net -1968.75 ml    Assessment/Plan: 1. R hand abscess - s/p R 5th finger amputation 3/15, x-ray with gas, s/p I and D per Fredna Dow 5/13- on zosyn and vanc 2. ESRD - HD on MWF @ [facility] via AVF, missed HD 5/11, received in the middle of the night- no apparent issues. 3. Hypertension/volume - BP generally low, on Midodrine; wt 108.9 kg, CXR negative yesterday. Will increase midodrine to max dose.  Otherwise supportive care per CCM- now on dopamine with HR 120's for low BP this AM.  Should not need HD again til Monday  4. Anemia - Hgb 12.3 on outpatient Epogen, weekly Fe. Interesting it is not lower s/p surgery 5. Metabolic bone disease - Ca 9.7 (10.5 corrected); no Vitamin D, Renvela 3 with meals. 6. Nutrition - Alb 3, renal diet, multivitamin. 7. DM Type 2 - insulin per primary. 8. Hx PVD - s/p B AKA 9. CAD - s/p PCI with DES to distal LCx 09/2013.   Prateek Knipple A    Labs: Basic Metabolic Panel:  Recent Labs Lab 09/20/14 0636 09/20/14 1930 09/21/14 0426  NA 135 136 135  K 5.7* 5.1 4.0  CL 95* 95* 96*  CO2 18* 21* 23  GLUCOSE 226* 144* 101*  BUN 79* 85* 56*  CREATININE 10.04* 10.35*  10.21* 7.54*  CALCIUM 9.7 9.5 8.8*  PHOS  --  8.4* 5.9*   Liver Function Tests:  Recent  Labs Lab 09/20/14 0636 09/20/14 1930 09/21/14 0426  AST 76*  --  79*  ALT 17  --  16*  ALKPHOS 146*  --  136*  BILITOT 3.3*  --  2.8*  PROT 7.7  --  7.5  ALBUMIN 3.0* 2.4* 2.4*   No results for input(s): LIPASE, AMYLASE in the last 168 hours. No results for input(s): AMMONIA in the last 168 hours. CBC:  Recent Labs Lab 09/20/14 0636 09/20/14 1930 09/21/14 0425  WBC 18.3* 15.5* 16.3*  NEUTROABS 16.5*  --   --   HGB 12.3* 12.7* 13.4  HCT 36.8* 37.0* 38.6*  MCV 91.5 88.1 88.1  PLT 98* 89* 82*   Cardiac Enzymes: No results for input(s): CKTOTAL, CKMB, CKMBINDEX, TROPONINI in the last 168 hours. CBG:  Recent Labs Lab 09/20/14 1255 09/20/14 1403 09/20/14 1624 09/20/14 1829 09/20/14 2135  GLUCAP 123* 144* 128* 117* 100*    Iron Studies: No results for input(s): IRON, TIBC, TRANSFERRIN, FERRITIN in the last 72 hours. Studies/Results: Dg Lumbar Spine Complete  09/20/2014   CLINICAL DATA:  Fall.  Low back pain.  Right hand swelling.  EXAM: LUMBAR  SPINE - COMPLETE 4+ VIEW  COMPARISON:  Lumbar spine radiographs 02/24/2013  FINDINGS: 5 non rib-bearing lumbar type vertebral bodies are present. Moderate osteopenia is noted. Vertebral body heights and alignment are maintained. Extensive atherosclerotic calcifications are present in the aorta and branch vessels without aneurysm. The bowel gas pattern is unremarkable. The pelvis is intact.  IMPRESSION: 1. No acute or focal abnormality of the lumbar spine. 2. Extensive atherosclerotic disease without evidence for aneurysm.   Electronically Signed   By: San Morelle M.D.   On: 09/20/2014 07:44   Ct Head Wo Contrast  09/20/2014   CLINICAL DATA:  Fall earlier today  EXAM: CT HEAD WITHOUT CONTRAST  TECHNIQUE: Contiguous axial images were obtained from the base of the skull through the vertex without intravenous contrast.  COMPARISON:  Sep 19, 2014  FINDINGS: Mild generalized atrophy is stable. There is slightly more cerebellar  atrophy than elsewhere. There is no intracranial mass, hemorrhage, extra-axial fluid collection, or midline shift. There is evidence of a prior infarct in the medial right occipital lobe, stable. There is slight periventricular small vessel disease. No new gray-white compartment lesion is identified. No acute infarct evident. Basal ganglia calcification may be physiologic in this age group. The bony calvarium appears intact. The mastoid air cells are clear. There is mucosal thickening in both maxillary antra with a retention cyst in the inferior left maxillary antrum. There is mucosal thickening of multiple ethmoid air cells. Mild mucosal thickening in both posterior sphenoid sinuses also noted.  IMPRESSION: Atrophy. Prior infarct medial right occipital lobe. Slight periventricular small vessel disease. No acute infarct apparent. No intracranial hemorrhage, mass, or extra-axial fluid. Paranasal sinus disease.   Electronically Signed   By: Lowella Grip III M.D.   On: 09/20/2014 07:18   Dg Hand Complete Right  09/20/2014   CLINICAL DATA:  Fall. Swelling of the right hand. Amputation of the fifth digit 2 months ago.  EXAM: Three views of the right hand.  COMPARISON:  Right hand radiographs 07/07/2014  FINDINGS: Amputation is noted at the proximal aspect of the fifth metacarpal. There is marked soft tissue swelling with gas in the long finger and ring finger. Extensive dorsal soft tissue swelling is noted over the hand diffuse subcutaneous gas centered over the first and second metacarpals. No definite bone erosion is evident. There is moderate osteopenia. Extensive small vessel calcifications are noted.  IMPRESSION: 1. Extensive soft tissue swelling with gas over the dorsal aspect of the hand extending into the digits compatible with cellulitis. This does not appear to be related to the acute event. 2. Extensive soft tissue swelling extends into all 4 residual digits. 3. Amputation of the fifth ray at the level  of the metacarpal. 4. Diffuse microvascular calcifications compatible with diabetes.   Electronically Signed   By: San Morelle M.D.   On: 09/20/2014 07:51   Dg Hip Unilat With Pelvis 1v Left  09/20/2014   CLINICAL DATA:  Fall. Left hip pain. The patient fell at home trying to get to the commode.  EXAM: LEFT HIP (WITH PELVIS) 1 VIEW  COMPARISON:  None.  FINDINGS: Moderate osteopenia is present. The left hip is located. No acute fracture or dislocation is evident. Extensive atherosclerotic calcifications are present. The pelvis is intact.  IMPRESSION: 1. No acute fracture of the left hip. 2. Moderate osteopenia. 3. Extensive atherosclerotic disease is again noted.   Electronically Signed   By: San Morelle M.D.   On: 09/20/2014 07:46   Medications: Infusions: .  sodium chloride 10 mL/hr at 09/20/14 2000  . DOPamine 5 mcg/kg/min (09/21/14 0700)    Scheduled Medications: . antiseptic oral rinse  7 mL Mouth Rinse BID  . atorvastatin  80 mg Oral QPM  . Chlorhexidine Gluconate Cloth  6 each Topical Q0600  . cinacalcet  90 mg Oral Q supper  . dicyclomine  10 mg Oral TID AC & HS  . heparin  5,000 Units Subcutaneous 3 times per day  . insulin aspart  0-5 Units Subcutaneous QHS  . insulin aspart  0-9 Units Subcutaneous TID WC  . midodrine  5 mg Oral TID WC  . multivitamin  1 tablet Oral QHS  . mupirocin ointment  1 application Nasal BID  . piperacillin-tazobactam (ZOSYN)  IV  2.25 g Intravenous 3 times per day  . sevelamer carbonate  1,600 mg Oral BID WC  . sodium chloride  3 mL Intravenous Q12H    have reviewed scheduled and prn medications.  Physical Exam: General: sleepy but appropriate- Heart: tachy Lungs: mostly clear Abdomen: obese, soft Extremities: mild edema Dialysis Access: right upper arm AVF- positive thrill and bruit     09/21/2014,7:54 AM  LOS: 1 day

## 2014-09-22 ENCOUNTER — Inpatient Hospital Stay (HOSPITAL_COMMUNITY): Payer: Medicare Other | Admitting: Anesthesiology

## 2014-09-22 ENCOUNTER — Encounter (HOSPITAL_COMMUNITY): Payer: Self-pay | Admitting: Certified Registered"

## 2014-09-22 ENCOUNTER — Encounter (HOSPITAL_COMMUNITY)
Admission: EM | Disposition: A | Discharge status: Skilled Nursing Facility | Payer: Self-pay | Source: Home / Self Care | Attending: Pulmonary Disease

## 2014-09-22 HISTORY — PX: I&D EXTREMITY: SHX5045

## 2014-09-22 LAB — POCT I-STAT 4, (NA,K, GLUC, HGB,HCT)
GLUCOSE: 152 mg/dL — AB (ref 65–99)
HEMATOCRIT: 41 % (ref 39.0–52.0)
Hemoglobin: 13.9 g/dL (ref 13.0–17.0)
Potassium: 4.6 mmol/L (ref 3.5–5.1)
Sodium: 135 mmol/L (ref 135–145)

## 2014-09-22 LAB — CBC
HCT: 35.4 % — ABNORMAL LOW (ref 39.0–52.0)
HEMOGLOBIN: 11.8 g/dL — AB (ref 13.0–17.0)
MCH: 29.7 pg (ref 26.0–34.0)
MCHC: 33.3 g/dL (ref 30.0–36.0)
MCV: 89.2 fL (ref 78.0–100.0)
Platelets: 97 10*3/uL — ABNORMAL LOW (ref 150–400)
RBC: 3.97 MIL/uL — AB (ref 4.22–5.81)
RDW: 17.2 % — AB (ref 11.5–15.5)
WBC: 14.4 10*3/uL — ABNORMAL HIGH (ref 4.0–10.5)

## 2014-09-22 LAB — GLUCOSE, CAPILLARY
GLUCOSE-CAPILLARY: 153 mg/dL — AB (ref 65–99)
GLUCOSE-CAPILLARY: 157 mg/dL — AB (ref 65–99)
GLUCOSE-CAPILLARY: 160 mg/dL — AB (ref 65–99)
Glucose-Capillary: 148 mg/dL — ABNORMAL HIGH (ref 65–99)
Glucose-Capillary: 131 mg/dL — ABNORMAL HIGH (ref 65–99)

## 2014-09-22 LAB — RENAL FUNCTION PANEL
ALBUMIN: 2 g/dL — AB (ref 3.5–5.0)
Anion gap: 16 — ABNORMAL HIGH (ref 5–15)
BUN: 56 mg/dL — ABNORMAL HIGH (ref 6–20)
CALCIUM: 8.2 mg/dL — AB (ref 8.9–10.3)
CHLORIDE: 95 mmol/L — AB (ref 101–111)
CO2: 22 mmol/L (ref 22–32)
CREATININE: 7.99 mg/dL — AB (ref 0.61–1.24)
GFR calc Af Amer: 7 mL/min — ABNORMAL LOW (ref >60)
GFR calc non Af Amer: 6 mL/min — ABNORMAL LOW (ref >60)
Glucose, Bld: 163 mg/dL — ABNORMAL HIGH (ref 65–99)
Phosphorus: 6.5 mg/dL — ABNORMAL HIGH (ref 2.5–4.6)
Potassium: 4.6 mmol/L (ref 3.5–5.1)
Sodium: 133 mmol/L — ABNORMAL LOW (ref 135–145)

## 2014-09-22 LAB — LACTIC ACID, PLASMA: LACTIC ACID, VENOUS: 3.1 mmol/L — AB (ref 0.5–2.0)

## 2014-09-22 SURGERY — MINOR IRRIGATION AND DEBRIDEMENT EXTREMITY
Anesthesia: General | Laterality: Right

## 2014-09-22 MED ORDER — SODIUM CHLORIDE 0.9 % IR SOLN
Status: DC | PRN
  Administered 2014-09-22: 3000 mL

## 2014-09-22 MED ORDER — BACITRACIN-NEOMYCIN-POLYMYXIN 400-5-5000 EX OINT
TOPICAL_OINTMENT | CUTANEOUS | Status: AC
  Filled 2014-09-22: qty 1

## 2014-09-22 MED ORDER — ONDANSETRON HCL 4 MG/2ML IJ SOLN
INTRAMUSCULAR | Status: DC | PRN
  Administered 2014-09-22: 4 mg via INTRAVENOUS

## 2014-09-22 MED ORDER — SODIUM CHLORIDE 0.9 % IV BOLUS (SEPSIS)
250.0000 mL | Freq: Once | INTRAVENOUS | Status: AC
  Administered 2014-09-22: 250 mL via INTRAVENOUS

## 2014-09-22 MED ORDER — BUPIVACAINE HCL (PF) 0.25 % IJ SOLN
INTRAMUSCULAR | Status: AC
  Filled 2014-09-22: qty 30

## 2014-09-22 MED ORDER — PROMETHAZINE HCL 25 MG/ML IJ SOLN
6.2500 mg | INTRAMUSCULAR | Status: DC | PRN
Start: 2014-09-22 — End: 2014-09-24

## 2014-09-22 MED ORDER — SODIUM CHLORIDE 0.9 % IV BOLUS (SEPSIS)
500.0000 mL | Freq: Once | INTRAVENOUS | Status: AC
  Administered 2014-09-22: 500 mL via INTRAVENOUS

## 2014-09-22 MED ORDER — HYDROMORPHONE HCL 1 MG/ML IJ SOLN: 0.2500 mg | INTRAMUSCULAR | Status: DC | PRN

## 2014-09-22 MED ORDER — PROPOFOL 10 MG/ML IV BOLUS
INTRAVENOUS | Status: DC | PRN
  Administered 2014-09-22: 100 mg via INTRAVENOUS

## 2014-09-22 MED ORDER — PHENYLEPHRINE HCL 10 MG/ML IJ SOLN
10.0000 mg | INTRAVENOUS | Status: DC | PRN
  Administered 2014-09-22: 60 ug/min via INTRAVENOUS

## 2014-09-22 MED ORDER — PHENYLEPHRINE HCL 10 MG/ML IJ SOLN
0.0000 ug/min | INTRAVENOUS | Status: DC
  Administered 2014-09-22: 35 ug/min via INTRAVENOUS
  Filled 2014-09-22 (×2): qty 1

## 2014-09-22 MED ORDER — MEPERIDINE HCL 25 MG/ML IJ SOLN: 6.2500 mg | INTRAMUSCULAR | Status: DC | PRN

## 2014-09-22 MED ORDER — LIDOCAINE HCL (CARDIAC) 20 MG/ML IV SOLN
INTRAVENOUS | Status: DC | PRN
  Administered 2014-09-22: 100 mg via INTRAVENOUS

## 2014-09-22 MED ORDER — PROPOFOL 10 MG/ML IV BOLUS
INTRAVENOUS | Status: AC
  Filled 2014-09-22: qty 20

## 2014-09-22 MED ORDER — SODIUM CHLORIDE 0.9 % IV SOLN
INTRAVENOUS | Status: DC | PRN
  Administered 2014-09-22: 09:00:00 via INTRAVENOUS

## 2014-09-22 MED ORDER — FENTANYL CITRATE (PF) 250 MCG/5ML IJ SOLN
INTRAMUSCULAR | Status: AC
  Filled 2014-09-22: qty 5

## 2014-09-22 MED ORDER — PHENYLEPHRINE HCL 10 MG/ML IJ SOLN
0.0000 ug/min | INTRAVENOUS | Status: DC
  Administered 2014-09-23: 60 ug/min via INTRAVENOUS
  Administered 2014-09-23: 40 ug/min via INTRAVENOUS
  Filled 2014-09-22 (×4): qty 4

## 2014-09-22 SURGICAL SUPPLY — 52 items
BANDAGE COBAN STERILE 2 (GAUZE/BANDAGES/DRESSINGS) IMPLANT
BANDAGE ELASTIC 3 VELCRO ST LF (GAUZE/BANDAGES/DRESSINGS) ×3 IMPLANT
BANDAGE ELASTIC 4 VELCRO ST LF (GAUZE/BANDAGES/DRESSINGS) ×3 IMPLANT
BNDG COHESIVE 1X5 TAN STRL LF (GAUZE/BANDAGES/DRESSINGS) IMPLANT
BNDG CONFORM 2 STRL LF (GAUZE/BANDAGES/DRESSINGS) IMPLANT
BNDG ESMARK 4X9 LF (GAUZE/BANDAGES/DRESSINGS) ×3 IMPLANT
BNDG GAUZE ELAST 4 BULKY (GAUZE/BANDAGES/DRESSINGS) ×3 IMPLANT
CORDS BIPOLAR (ELECTRODE) ×3 IMPLANT
COVER SURGICAL LIGHT HANDLE (MISCELLANEOUS) ×3 IMPLANT
DECANTER SPIKE VIAL GLASS SM (MISCELLANEOUS) ×3 IMPLANT
DRAIN PENROSE 1/4X12 LTX STRL (WOUND CARE) IMPLANT
DRSG ADAPTIC 3X8 NADH LF (GAUZE/BANDAGES/DRESSINGS) IMPLANT
DRSG EMULSION OIL 3X3 NADH (GAUZE/BANDAGES/DRESSINGS) ×3 IMPLANT
DRSG PAD ABDOMINAL 8X10 ST (GAUZE/BANDAGES/DRESSINGS) ×3 IMPLANT
GAUZE PACKING IODOFORM 2 (PACKING) ×3 IMPLANT
GAUZE SPONGE 4X4 12PLY STRL (GAUZE/BANDAGES/DRESSINGS) ×3 IMPLANT
GAUZE XEROFORM 1X8 LF (GAUZE/BANDAGES/DRESSINGS) ×3 IMPLANT
GLOVE BIO SURGEON STRL SZ7.5 (GLOVE) ×6 IMPLANT
GLOVE BIOGEL PI IND STRL 8 (GLOVE) ×2 IMPLANT
GLOVE BIOGEL PI INDICATOR 8 (GLOVE) ×4
GOWN STRL REUS W/ TWL LRG LVL3 (GOWN DISPOSABLE) ×3 IMPLANT
GOWN STRL REUS W/TWL LRG LVL3 (GOWN DISPOSABLE) ×6
KIT BASIN OR (CUSTOM PROCEDURE TRAY) ×6 IMPLANT
KIT ROOM TURNOVER OR (KITS) ×6 IMPLANT
LOOP VESSEL MAXI BLUE (MISCELLANEOUS) IMPLANT
LOOP VESSEL MINI RED (MISCELLANEOUS) IMPLANT
MANIFOLD NEPTUNE II (INSTRUMENTS) ×3 IMPLANT
NEEDLE HYPO 25X1 1.5 SAFETY (NEEDLE) IMPLANT
NS IRRIG 1000ML POUR BTL (IV SOLUTION) ×3 IMPLANT
PACK ORTHO EXTREMITY (CUSTOM PROCEDURE TRAY) ×6 IMPLANT
PAD ARMBOARD 7.5X6 YLW CONV (MISCELLANEOUS) ×3 IMPLANT
PAD CAST 4YDX4 CTTN HI CHSV (CAST SUPPLIES) ×1 IMPLANT
PADDING CAST COTTON 4X4 STRL (CAST SUPPLIES) ×2
SCRUB BETADINE 4OZ XXX (MISCELLANEOUS) ×6 IMPLANT
SET CYSTO W/LG BORE CLAMP LF (SET/KITS/TRAYS/PACK) ×6 IMPLANT
SOLUTION BETADINE 4OZ (MISCELLANEOUS) ×6 IMPLANT
SPONGE GAUZE 4X4 12PLY STER LF (GAUZE/BANDAGES/DRESSINGS) ×3 IMPLANT
SPONGE LAP 18X18 X RAY DECT (DISPOSABLE) ×3 IMPLANT
SPONGE LAP 4X18 X RAY DECT (DISPOSABLE) ×3 IMPLANT
SUCTION FRAZIER TIP 10 FR DISP (SUCTIONS) ×3 IMPLANT
SUT ETHILON 4 0 PS 2 18 (SUTURE) ×3 IMPLANT
SUT MON AB 5-0 P3 18 (SUTURE) IMPLANT
SYR CONTROL 10ML LL (SYRINGE) IMPLANT
TOWEL OR 17X24 6PK STRL BLUE (TOWEL DISPOSABLE) ×6 IMPLANT
TOWEL OR 17X26 10 PK STRL BLUE (TOWEL DISPOSABLE) ×6 IMPLANT
TUBE ANAEROBIC SPECIMEN COL (MISCELLANEOUS) IMPLANT
TUBE CONNECTING 12'X1/4 (SUCTIONS) ×2
TUBE CONNECTING 12X1/4 (SUCTIONS) ×4 IMPLANT
TUBE FEEDING 5FR 15 INCH (TUBING) IMPLANT
UNDERPAD 30X30 INCONTINENT (UNDERPADS AND DIAPERS) ×6 IMPLANT
WATER STERILE IRR 1000ML POUR (IV SOLUTION) ×3 IMPLANT
YANKAUER SUCT BULB TIP NO VENT (SUCTIONS) ×6 IMPLANT

## 2014-09-22 NOTE — Op Note (Signed)
[id number] 

## 2014-09-22 NOTE — Progress Notes (Signed)
[place] TEAM 1 - Stepdown/ICU TEAM Progress Note  Julian [name] GYF:[id number] DOB: [birth year] DOA: 09/20/2014 PCP: Vic Blackbird, MD  Admit HPI / Brief Narrative: 63 w ESRD on HD, severe PVD, DM. Transferred to [facility] for surgical debridement of R hand wound. He was hypotensive post-op and transferred to ICU on dopamine.   Significant Events: 5/13 - admit w/ R hand cellulitis   5/13 - R hand debridement  HPI/Subjective: Patient is confused this morning.  He has been refusing essentially all medications or interventions. He was able to calm down when his sister arrived and is now willing to cooperate w/ his care.  The pt c/o R flank pain/back pain.  He denies sob, n/v, or abdom pain.    Assessment/Plan:  R hand gas gangrene To return to OR today for repeat I&D - per Hand Surgery   Shock, likely septic shock superimposed on underlying chronic hypotension Lactate elevated again yesterday afternoon, though not surprising in pt w/ chronic hypotension and ongoing hand infection - hemodynamically stable at this time - follow   Severe PVD S/p B LE AKAs  ESRD on HD Nephrology following   Hyperkalemia  K+ now normal - follow   DM2 CBG currently well controlled - follow trend   Anemia of chronic disease  Hgb holding steady - follow   Chronic diarrhea  CAD Denies angina   Ischemic CM Restart ASA, Brilinta, and statin when stabilized from surgical/bleeding standpoint   Heb B Surface Ab+ Suggestive of prior immunization   Obesity - Body mass index is 32.42 kg/(m^2).  MRSA screen +  Code Status: FULL Family Communication: spoke w/ younger sister at bedside  Disposition Plan: SDU  Consultants: Hand Surgery - [facility] P[facility]  Antibiotics: Vanco 5/13 >  Zosyn 5/13 >   DVT prophylaxis: SQ heparin   Objective: Blood pressure 102/90, pulse 60, temperature 97.4 F (36.3 C), temperature source Oral, resp. rate 15, height 5\' 10"  (1.778 m), weight 102.5 kg (225 lb 15.5  oz), SpO2 100 %.  Intake/Output Summary (Last 24 hours) at 09/22/14 0839 Last data filed at 09/22/14 0800  Gross per 24 hour  Intake 987.82 ml  Output      0 ml  Net 987.82 ml   Exam: General: No acute respiratory distress - alert but confused  Lungs: Clear to auscultation bilaterally without wheezes or crackles Cardiovascular: Regular rate and rhythm without murmur gallop or rub  Abdomen: Nontender, nondistended, soft, bowel sounds positive, no rebound, no ascites, no appreciable mass Extremities: No significant cyanosis, or clubbing, B LE AKA stumps intact   Data Reviewed: Basic Metabolic Panel:  Recent Labs Lab 09/20/14 0636 09/20/14 1930 09/21/14 0426  NA 135 136 135  K 5.7* 5.1 4.0  CL 95* 95* 96*  CO2 18* 21* 23  GLUCOSE 226* 144* 101*  BUN 79* 85* 56*  CREATININE 10.04* 10.35*  10.21* 7.54*  CALCIUM 9.7 9.5 8.8*  MG  --   --  2.0  PHOS  --  8.4* 5.9*    CBC:  Recent Labs Lab 09/20/14 0636 09/20/14 1930 09/21/14 0425 09/22/14 0242  WBC 18.3* 15.5* 16.3* 14.4*  NEUTROABS 16.5*  --   --   --   HGB 12.3* 12.7* 13.4 11.8*  HCT 36.8* 37.0* 38.6* 35.4*  MCV 91.5 88.1 88.1 89.2  PLT 98* 89* 82* 97*    Liver Function Tests:  Recent Labs Lab 09/20/14 0636 09/20/14 1930 09/21/14 0426  AST 76*  --  79*  ALT 17  --  16*  ALKPHOS 146*  --  136*  BILITOT 3.3*  --  2.8*  PROT 7.7  --  7.5  ALBUMIN 3.0* 2.4* 2.4*    CBG:  Recent Labs Lab 09/20/14 2135 09/21/14 0826 09/21/14 1241 09/21/14 1607 09/21/14 2211  GLUCAP 100* 99 125* 117* 147*    Recent Results (from the past 240 hour(s))  Urine culture     Status: None   Collection Time: 09/20/14  7:00 AM  Result Value Ref Range Status   Specimen Description URINE, CATHETERIZED  Final   Special Requests NONE  Final   Colony Count NO GROWTH Performed at [facility]   Final   Culture NO GROWTH Performed at [facility]   Final   Report Status 09/21/2014 FINAL  Final  Culture,  blood (routine x 2)     Status: None (Preliminary result)   Collection Time: 09/20/14  7:48 AM  Result Value Ref Range Status   Specimen Description LEFT ANTECUBITAL  Final   Special Requests BOTTLES DRAWN AEROBIC ONLY 6CC  Final   Culture NO GROWTH 2 DAYS  Final   Report Status PENDING  Incomplete  Culture, blood (routine x 2)     Status: None (Preliminary result)   Collection Time: 09/20/14  8:30 AM  Result Value Ref Range Status   Specimen Description BLOOD LEFT HAND  Final   Special Requests BOTTLES DRAWN AEROBIC AND ANAEROBIC 6CC  Final   Culture NO GROWTH 2 DAYS  Final   Report Status PENDING  Incomplete  Anaerobic culture     Status: None (Preliminary result)   Collection Time: 09/20/14  2:46 PM  Result Value Ref Range Status   Specimen Description ABSCESS  Final   Special Requests RIGHT HAND PT ON VACOMYCIN ZOSYN  Final   Gram Stain   Final    FEW WBC PRESENT, PREDOMINANTLY MONONUCLEAR NO SQUAMOUS EPITHELIAL CELLS SEEN ABUNDANT GRAM POSITIVE COCCI IN PAIRS IN CLUSTERS ABUNDANT GRAM NEGATIVE RODS Performed at [facility]    Culture   Final    NO ANAEROBES ISOLATED; CULTURE IN PROGRESS FOR 5 DAYS Performed at [facility]    Report Status PENDING  Incomplete  Culture, routine-abscess     Status: None (Preliminary result)   Collection Time: 09/20/14  2:46 PM  Result Value Ref Range Status   Specimen Description ABSCESS  Final   Special Requests RIGHT HAND PT ON VANCOMYCIN ZOSYN  Final   Gram Stain   Final    FEW WBC PRESENT, PREDOMINANTLY MONONUCLEAR NO SQUAMOUS EPITHELIAL CELLS SEEN ABUNDANT GRAM POSITIVE COCCI IN PAIRS IN CLUSTERS ABUNDANT GRAM NEGATIVE RODS Performed at [facility]    Culture   Final    Culture reincubated for better growth Performed at [facility]    Report Status PENDING  Incomplete  MRSA PCR Screening     Status: Abnormal   Collection Time: 09/20/14  6:45 PM  Result Value Ref Range Status   MRSA by PCR  POSITIVE (A) NEGATIVE Final    Comment:        The GeneXpert MRSA Assay (FDA approved for NASAL specimens only), is one component of a comprehensive MRSA colonization surveillance program. It is not intended to diagnose MRSA infection nor to guide or monitor treatment for MRSA infections. RESULT CALLED TO, READ BACK BY AND VERIFIED WITH: T WOODS RN 2156 09/20/14 A BROWNING     Studies:   Recent x-ray studies have been reviewed in detail by  the Attending Physician  Scheduled Meds:  Scheduled Meds: . antiseptic oral rinse  7 mL Mouth Rinse BID  . atorvastatin  80 mg Oral QPM  . Chlorhexidine Gluconate Cloth  6 each Topical Q0600  . cinacalcet  90 mg Oral Q supper  . dicyclomine  10 mg Oral TID AC & HS  . heparin  5,000 Units Subcutaneous 3 times per day  . insulin aspart  0-5 Units Subcutaneous QHS  . insulin aspart  0-9 Units Subcutaneous TID WC  . midodrine  10 mg Oral TID WC  . multivitamin  1 tablet Oral QHS  . mupirocin ointment  1 application Nasal BID  . piperacillin-tazobactam (ZOSYN)  IV  2.25 g Intravenous 3 times per day  . sevelamer carbonate  1,600 mg Oral BID WC  . sodium chloride  3 mL Intravenous Q12H  . [START ON 09/23/2014] vancomycin  1,000 mg Intravenous Q M,W,F-HD    Time spent on care of this patient: 35 mins   Decorian Schuenemann T , MD   [name] Office  [phone] Pager - Text Page per Shea Evans as per below:  On-Call/Text Page:      [email or web address]      password [email or web address]  If 7PM-7AM, please contact night-coverage [email or web address] Password [facility] 09/22/2014, 8:39 AM   LOS: 2 days

## 2014-09-22 NOTE — Transfer of Care (Signed)
Immediate Anesthesia Transfer of Care Note  Patient: Julian [name]  Procedure(s) Performed: Procedure(s): REPEAT IRRIGATION AND DEBRIDEMENT RIGHT HAND (Right)  Patient Location: PACU  Anesthesia Type:General  Level of Consciousness: sedated and patient cooperative  Airway & Oxygen Therapy: Patient Spontanous Breathing and Patient connected to nasal cannula oxygen  Post-op Assessment: Report given to RN, Post -op Vital signs reviewed and stable and Patient moving all extremities  Post vital signs: Reviewed and stable  Last Vitals:  Filed Vitals:   09/22/14 0800  BP: 102/90  Pulse: 60  Temp:   Resp: 15    Complications: No apparent anesthesia complications

## 2014-09-22 NOTE — Anesthesia Preprocedure Evaluation (Addendum)
Anesthesia Evaluation  Patient identified by MRN, date of birth, ID band Patient awake    Reviewed: Allergy & Precautions, H&P , NPO status , Patient's Chart, lab work & pertinent test results  History of Anesthesia Complications Negative for: history of anesthetic complications  Airway Mallampati: II  TM Distance: >3 FB Neck ROM: Full    Dental  (+) Poor Dentition, Dental Advidsory Given, Loose, Chipped, Missing   Pulmonary neg pulmonary ROS,  breath sounds clear to auscultation        Cardiovascular hypertension, Pt. on medications - angina+ CAD, + Past MI, + Cardiac Stents ('15 NSTEMI) and + Peripheral Vascular Disease + dysrhythmias Rhythm:Regular Rate:Normal  09/2013 TTE: Left ventricle: The cavity size was normal. Wall thickness was increased in a pattern of mild LVH. Systolic function was mildly to moderately reduced. The estimated ejection fraction was in the range of 40% to 45%. Doppler parameters are consistent with a reversible restrictive pattern, indicative of decreased left ventricular diastolic compliance and/or increased left atrial pressure (grade 3 diastolic dysfunction). - Aortic valve: Valve area: 1.76cm^2(VTI). Valve area: 1.8cm^2 (Vmax). - Mitral valve: Calcified annulus. Mildly thickened leaflets . Valve area by continuity equation (using LVOT flow): 1.22cm^2. - Left atrium: The atrium was mildly dilated.    Neuro/Psych negative neurological ROS     GI/Hepatic negative GI ROS, Neg liver ROS,   Endo/Other  diabetes, Type 2Morbid obesity  Renal/GU ESRF and DialysisRenal disease (MWF dialysis, K+ 5.7)     Musculoskeletal   Abdominal (+) + obese,   Peds  Hematology  (+) anemia , Plts 98,000 Hgb 12.3   Anesthesia Other Findings   Reproductive/Obstetrics                             Anesthesia Physical  Anesthesia Plan  ASA: III  Anesthesia  Plan: General   Post-op Pain Management:    Induction: Intravenous  Airway Management Planned: LMA  Additional Equipment:   Intra-op Plan:   Post-operative Plan: Extubation in OR  Informed Consent: I have reviewed the patients History and Physical, chart, labs and discussed the procedure including the risks, benefits and alternatives for the proposed anesthesia with the patient or authorized representative who has indicated his/her understanding and acceptance.   Dental advisory given  Plan Discussed with: CRNA and Surgeon  Anesthesia Plan Comments:         Anesthesia Quick Evaluation

## 2014-09-22 NOTE — Progress Notes (Signed)
Subjective: Day of Surgery Procedure(s) (LRB): MINOR IRRIGATION AND DEBRIDEMENT RIGHT HAND (Right) Patient reports pain as mild.    Objective: Vital signs in last 24 hours: Temp:  [97.4 F (36.3 C)-98.2 F (36.8 C)] 97.4 F (36.3 C) (05/15 0752) Pulse Rate:  [27-136] 60 (05/15 0800) Resp:  [0-34] 15 (05/15 0800) BP: (43-175)/(15-146) 102/90 mmHg (05/15 0800) SpO2:  [82 %-100 %] 100 % (05/15 0800)  Intake/Output from previous day: 05/14 0701 - 05/15 0700 In: 997.6 [I.V.:697.6; IV Piggyback:300] Out: -  Intake/Output this shift: Total I/O In: 10 [I.V.:10] Out: -    Recent Labs  09/20/14 0636 09/20/14 1930 09/21/14 0425 09/22/14 0242  HGB 12.3* 12.7* 13.4 11.8*    Recent Labs  09/21/14 0425 09/22/14 0242  WBC 16.3* 14.4*  RBC 4.38 3.97*  HCT 38.6* 35.4*  PLT 82* 97*    Recent Labs  09/20/14 1930 09/21/14 0426  NA 136 135  K 5.1 4.0  CL 95* 96*  CO2 21* 23  BUN 85* 56*  CREATININE 10.35*  10.21* 7.54*  GLUCOSE 144* 101*  CALCIUM 9.5 8.8*   No results for input(s): LABPT, INR in the last 72 hours.  dressing clean/dry/intact.  malodorous.  long finger dark, ring with slow capillary refill, index and thumb with capillary refill.  no erythema proximal to dressing.  Assessment/Plan: Day of Surgery Procedure(s) (LRB): MINOR IRRIGATION AND DEBRIDEMENT RIGHT HAND (Right) Return to OR for repeat irrigation and debridement.  Risks, benefits, and alternatives of surgery were discussed and the patient agrees with the plan of care.   Jamekia Gannett R 09/22/2014, 9:16 AM

## 2014-09-22 NOTE — Progress Notes (Signed)
Subjective:  Remains intermittently hypotensive requiring pressors- needs to go back to OR tomorrow for further debridement.  Patient has been belligerent- refusing meds- [name] little confused Objective Vital signs in last 24 hours: Filed Vitals:   09/22/14 0615 09/22/14 0630 09/22/14 0645 09/22/14 0700  BP: 82/65 77/41 59/24  84/30  Pulse: 61 63 61 60  Temp:      TempSrc:      Resp: 11 10 9 10   Height:      Weight:      SpO2: 100% 100% 100% 100%   Weight change:   Intake/Output Summary (Last 24 hours) at 09/22/14 0740 Last data filed at 09/22/14 0600  Gross per 24 hour  Intake 987.62 ml  Output      0 ml  Net 987.62 ml    Assessment/Plan: 1. 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 ESRD - HD on MWF @ [facility] via AVF, missed HD 5/11, received Friday night- no apparent issues. Will be due again tomorrow- will write orders - need to coordinate with surgery  3. Hypertension/volume - BP generally low, on Midodrine; wt 108.9 kg, CXR negative yesterday. Will increase midodrine to max dose.  Otherwise supportive care per CCM- now on neo prn- currently off for low BP this AM.   4. Anemia - Hgb adequate on outpatient Epogen, weekly Fe. Interesting it is not lower s/p surgery 5. Metabolic bone disease - Ca 9.7 (10.5 corrected); no Vitamin D, Renvela 3 with meals, also sensipar 6. Nutrition - Alb 2.4, renal diet, multivitamin. Complicating issue 7. DM Type 2 - insulin per primary. 8. Hx PVD - s/p B AKA 9. CAD - s/p PCI with DES to distal LCx 09/2013. 10. Behavior- unfortunately cannot use meds to sedate.  Maybe he is trying to tell us he is upset with his QOL- time to have discussions to stop HD ??   Julian [name]    Labs: Basic Metabolic Panel:  Recent Labs Lab 09/20/14 0636 09/20/14 1930 09/21/14 0426  NA 135 136 135  K 5.7* 5.1 4.0  CL 95* 95* 96*  CO2 18*  21* 23  GLUCOSE 226* 144* 101*  BUN 79* 85* 56*  CREATININE 10.04* 10.35*  10.21* 7.54*  CALCIUM 9.7 9.5 8.8*  PHOS  --  8.4* 5.9*   Liver Function Tests:  Recent Labs Lab 09/20/14 0636 09/20/14 1930 09/21/14 0426  AST 76*  --  79*  ALT 17  --  16*  ALKPHOS 146*  --  136*  BILITOT 3.3*  --  2.8*  PROT 7.7  --  7.5  ALBUMIN 3.0* 2.4* 2.4*   No results for input(s): LIPASE, AMYLASE in the last 168 hours. No results for input(s): AMMONIA in the last 168 hours. CBC:  Recent Labs Lab 09/20/14 0636 09/20/14 1930 09/21/14 0425 09/22/14 0242  WBC 18.3* 15.5* 16.3* 14.4*  NEUTROABS 16.5*  --   --   --   HGB 12.3* 12.7* 13.4 11.8*  HCT 36.8* 37.0* 38.6* 35.4*  MCV 91.5 88.1 88.1 89.2  PLT 98* 89* 82* 97*   Cardiac Enzymes: No results for input(s): CKTOTAL, CKMB, CKMBINDEX, TROPONINI in the last 168 hours. CBG:  Recent Labs Lab 09/20/14 2135 09/21/14 0826 09/21/14 1241 09/21/14 1607 09/21/14 2211  GLUCAP 100* 99 125* 117* 147*    Iron Studies: No results for input(s):  IRON, TIBC, TRANSFERRIN, FERRITIN in the last 72 hours. Studies/Results: No results found. Medications: Infusions: . sodium chloride 10 mL/hr at 09/20/14 2000  . phenylephrine (NEO-SYNEPHRINE) Adult infusion Stopped (09/22/14 0200)    Scheduled Medications: . antiseptic oral rinse  7 mL Mouth Rinse BID  . atorvastatin  80 mg Oral QPM  . Chlorhexidine Gluconate Cloth  6 each Topical Q0600  . cinacalcet  90 mg Oral Q supper  . dicyclomine  10 mg Oral TID AC & HS  . heparin  5,000 Units Subcutaneous 3 times per day  . insulin aspart  0-5 Units Subcutaneous QHS  . insulin aspart  0-9 Units Subcutaneous TID WC  . midodrine  10 mg Oral TID WC  . multivitamin  1 tablet Oral QHS  . mupirocin ointment  1 application Nasal BID  . piperacillin-tazobactam (ZOSYN)  IV  2.25 g Intravenous 3 times per day  . sevelamer carbonate  1,600 mg Oral BID WC  . sodium chloride  3 mL Intravenous Q12H  . [START  ON 09/23/2014] vancomycin  1,000 mg Intravenous Q M,W,F-HD    have reviewed scheduled and prn medications.  Physical Exam: General: angry- not entirely clear Heart: tachy Lungs: mostly clear Abdomen: obese, soft Extremities: mild edema Dialysis Access: right upper arm AVF- positive thrill and bruit     09/22/2014,7:40 AM  LOS: 2 days

## 2014-09-22 NOTE — Anesthesia Postprocedure Evaluation (Signed)
Anesthesia Post Note  Patient: Julian [name]  Procedure(s) Performed: Procedure(s) (LRB): REPEAT IRRIGATION AND DEBRIDEMENT RIGHT HAND (Right)  Anesthesia type: General  Patient location: PACU  Post pain: Pain level controlled  Post assessment: Post-op Vital signs reviewed  Last Vitals: BP 71/47 mmHg  Pulse 64  Temp(Src) 36.6 C (Oral)  Resp 10  Ht 5\' 10"  (1.778 m)  Wt 225 lb 15.5 oz (102.5 kg)  BMI 32.42 kg/m2  SpO2 100%  Post vital signs: Reviewed  Level of consciousness: sedated  Complications: No apparent anesthesia complications

## 2014-09-22 NOTE — Brief Op Note (Signed)
09/20/2014 - 09/22/2014  11:10 AM  PATIENT:  Julian [name]  69 y.o. male  PRE-OPERATIVE DIAGNOSIS:  Right hand gas gangrene  POST-OPERATIVE DIAGNOSIS:  same  PROCEDURE:  Procedure(s): REPEAT IRRIGATION AND DEBRIDEMENT RIGHT HAND (Right)  SURGEON:  Surgeon(s) and Role:    * Leanora Cover, MD - Primary  PHYSICIAN ASSISTANT:   ASSISTANTS: none   ANESTHESIA:   general  EBL:  Total I/O In: 330 [I.V.:330] Out: -   BLOOD ADMINISTERED:none  DRAINS: iodoform packing  LOCAL MEDICATIONS USED:  NONE  SPECIMEN:  Source of Specimen:  right hand  DISPOSITION OF SPECIMEN:  micro  COUNTS:  YES  TOURNIQUET:   Total Tourniquet Time Documented: Forearm (Right) - 52 minutes Total: Forearm (Right) - 52 minutes   DICTATION: .Other Dictation: Dictation Number [id number]  PLAN OF CARE: Return to floor  PATIENT DISPOSITION:  PACU - hemodynamically stable.   Delay start of Pharmacological VTE agent (>24hrs) due to surgical blood loss or risk of bleeding: no

## 2014-09-22 NOTE — Op Note (Signed)
NAMEMARSDEN, ZAINO NO.:  [id number]  MEDICAL RECORD NO.:  [id number]  LOCATION:  2M03C                        FACILITY:  [place]  PHYSICIAN:  Leanora Cover, MD        DATE OF BIRTH:  [birth year]  DATE OF PROCEDURE:  09/22/2014 DATE OF DISCHARGE:                              OPERATIVE REPORT   PREOPERATIVE DIAGNOSIS:  Right hand gas gangrene.  POSTOPERATIVE DIAGNOSIS:  Right hand gas gangrene.  PROCEDURE:  Repeat irrigation and debridement of right hand for a gas gangrene including both volar and dorsal sides.  SURGEON:  Leanora Cover, MD  ASSISTANTS:  None.  ANESTHESIA:  General.  IV FLUIDS:  Per anesthesia flow sheet.  ESTIMATED BLOOD LOSS:  Minimal.  COMPLICATIONS:  None.  SPECIMENS:  Cultures to micro.  TOURNIQUET TIME:  52 minutes.  DISPOSITION:  Stable to PACU.  INDICATIONS:  Mr. Duddy is a 69 year old male who underwent an incision and drainage of both volar and dorsal aspects of his right hand for gas gangrene two days ago.  He returns today for repeat irrigation and debridement.  The risks, benefits, and alternatives of surgery were discussed including risk of blood loss, infection, damage to nerves, vessels, tendons, ligaments, bone, failure of surgery, need for additional surgery, complications with wound healing, continued pain, continued infection, need for repeat irrigation and debridement, possible need for amputation.  He voiced understanding of these risks and elected to proceed.  OPERATIVE COURSE:  After being identified preoperatively by myself, the patient and I agreed upon procedure and site of procedure.  Surgical site was marked.  The risks, benefits, and alternatives of surgery were reviewed and he wished to proceed.  Surgical consent had been signed. He was transferred to the operating room and placed on the operating room table in supine position with the right upper extremity on arm board.  General anesthesia was  induced by the anesthesiologist.  The right upper extremity was prepped and draped in normal sterile orthopedic fashion.  A surgical pause was performed between surgeons, anesthesia, and operating room staff, and all were in agreement as to the patient, procedure, and site of procedure.  The patient is on scheduled antibiotics.  Tourniquet at the proximal aspect of the forearm was inflated to 250 mmHg after gravity exsanguination of the hand and forearm.  The wounds were inspected.  There was some reaccumulation of small amount of purulence in the volar wounds.  There was increased necrotic tissue in the long finger and some necrotic tissue coursing along the neurovascular bundle on the ulnar side of the index finger. The index finger wound was extended distally on the volar side and carried into subcutaneous tissues by spreading technique.  Necrotic tissue was removed using a combination of sharp dissection with scissors and light dissection with rongeurs.  It appeared that the necrosis was tracking along the neurovascular bundle which was protected throughout the case.  Sharp debridement of necrotic tissue was performed in both the index and long finger.  The ring finger did not have a significant re-accumulation of purulence or necrotic tissue.  On the dorsum of the hand, there was some reaccumulation of purulence over the thumb index web  space and in the dorsum of the long finger.  This necrotic tissue was debrided again sharply using the scissors.  The necrotic skin edges were sharply removed using the scissors.  The wound was extended proximally in the forearm.  There was some induration of the subcutaneous tissues but no further purulence.  On the volar side of the hand, the scissors have been sharply used to debride the skin, subcutaneous tissues, and fascia.  The long and ring fingers were dusky with poor turgor.  The index finger and thumb had more appropriate turgor and were  pink at the start of the case.  The wounds were all packed with 0.5 inch Iodoform gauze and dressed with sterile 4x4s, ABDs, and wrapped with a Kerlix bandage.  A volar splint was placed and wrapped with Kerlix and Ace bandage.  The tourniquet was deflated at 52 minutes.  Thumb and index finger had intact capillary refill.  The long and ring finger were dusky.  The operative drapes were broken down, and the patient was awoken from anesthesia safely.  He was transferred back to stretcher and taken to PACU in stable condition.  He will be continued on IV antibiotics.     Leanora Cover, MD     KK/MEDQ  D:  09/22/2014  T:  09/22/2014  Job:  [id number]

## 2014-09-22 NOTE — Progress Notes (Signed)
Utilization review completed.  

## 2014-09-22 NOTE — Anesthesia Procedure Notes (Signed)
Procedure Name: LMA Insertion Date/Time: 09/22/2014 9:57 AM Performed by: Julian Reil Pre-anesthesia Checklist: Patient identified, Emergency Drugs available, Suction available and Patient being monitored Patient Re-evaluated:Patient Re-evaluated prior to inductionOxygen Delivery Method: Circle system utilized Preoxygenation: Pre-oxygenation with 100% oxygen Intubation Type: IV induction LMA: LMA inserted LMA Size: 4.0 Tube type: Oral Number of attempts: 1 Placement Confirmation: positive ETCO2 and breath sounds checked- equal and bilateral Tube secured with: Tape Dental Injury: Teeth and Oropharynx as per pre-operative assessment

## 2014-09-23 ENCOUNTER — Encounter (HOSPITAL_COMMUNITY): Payer: Self-pay | Admitting: Orthopedic Surgery

## 2014-09-23 ENCOUNTER — Inpatient Hospital Stay (HOSPITAL_COMMUNITY): Payer: Medicare Other

## 2014-09-23 DIAGNOSIS — I9589 Other hypotension: Secondary | ICD-10-CM

## 2014-09-23 DIAGNOSIS — I313 Pericardial effusion (noninflammatory): Secondary | ICD-10-CM

## 2014-09-23 LAB — CULTURE, ROUTINE-ABSCESS

## 2014-09-23 LAB — GLUCOSE, CAPILLARY
GLUCOSE-CAPILLARY: 101 mg/dL — AB (ref 65–99)
GLUCOSE-CAPILLARY: 134 mg/dL — AB (ref 65–99)
Glucose-Capillary: 111 mg/dL — ABNORMAL HIGH (ref 65–99)
Glucose-Capillary: 119 mg/dL — ABNORMAL HIGH (ref 65–99)
Glucose-Capillary: 132 mg/dL — ABNORMAL HIGH (ref 65–99)

## 2014-09-23 LAB — CBC
HEMATOCRIT: 35.8 % — AB (ref 39.0–52.0)
Hemoglobin: 12.3 g/dL — ABNORMAL LOW (ref 13.0–17.0)
MCH: 30.4 pg (ref 26.0–34.0)
MCHC: 34.4 g/dL (ref 30.0–36.0)
MCV: 88.4 fL (ref 78.0–100.0)
Platelets: 104 10*3/uL — ABNORMAL LOW (ref 150–400)
RBC: 4.05 MIL/uL — ABNORMAL LOW (ref 4.22–5.81)
RDW: 17 % — AB (ref 11.5–15.5)
WBC: 13.4 10*3/uL — ABNORMAL HIGH (ref 4.0–10.5)

## 2014-09-23 LAB — COMPREHENSIVE METABOLIC PANEL
ALBUMIN: 2 g/dL — AB (ref 3.5–5.0)
ALT: 15 U/L — ABNORMAL LOW (ref 17–63)
AST: 40 U/L (ref 15–41)
Alkaline Phosphatase: 114 U/L (ref 38–126)
Anion gap: 15 (ref 5–15)
BUN: 62 mg/dL — ABNORMAL HIGH (ref 6–20)
CALCIUM: 7.7 mg/dL — AB (ref 8.9–10.3)
CO2: 22 mmol/L (ref 22–32)
CREATININE: 8.53 mg/dL — AB (ref 0.61–1.24)
Chloride: 95 mmol/L — ABNORMAL LOW (ref 101–111)
GFR calc Af Amer: 7 mL/min — ABNORMAL LOW (ref >60)
GFR, EST NON AFRICAN AMERICAN: 6 mL/min — AB (ref >60)
Glucose, Bld: 138 mg/dL — ABNORMAL HIGH (ref 65–99)
Potassium: 5.1 mmol/L (ref 3.5–5.1)
Sodium: 132 mmol/L — ABNORMAL LOW (ref 135–145)
TOTAL PROTEIN: 6.5 g/dL (ref 6.5–8.1)
Total Bilirubin: 3.1 mg/dL — ABNORMAL HIGH (ref 0.3–1.2)

## 2014-09-23 LAB — LACTIC ACID, PLASMA: Lactic Acid, Venous: 2.1 mmol/L (ref 0.5–2.0)

## 2014-09-23 MED ORDER — ALBUMIN HUMAN 25 % IV SOLN
25.0000 g | Freq: Once | INTRAVENOUS | Status: AC
  Administered 2014-09-23: 25 g via INTRAVENOUS
  Filled 2014-09-23: qty 100

## 2014-09-23 MED ORDER — ALBUMIN HUMAN 25 % IV SOLN
25.0000 g | Freq: Once | INTRAVENOUS | Status: DC
  Filled 2014-09-23: qty 100

## 2014-09-23 NOTE — Progress Notes (Signed)
Lab unable to obtain lab draw this am due to poor access. Dr. Elsworth Soho notified and will have labs drawn in dialysis today.

## 2014-09-23 NOTE — Progress Notes (Addendum)
PULMONARY / CRITICAL CARE MEDICINE   Name: Julian [name] MRN: [id number] DOB: 1945/09/19    ADMISSION DATE:  09/20/2014 CONSULTATION DATE:  09/20/14  REFERRING MD :  Dr Wyline Copas  CHIEF COMPLAINT:  Septic Shock  INITIAL PRESENTATION:  26 w ESRD on HD, severe PVD, DM. Transferred to [facility] for surgical debridement of R hand wound. He was hypotensive post-op and transferred to ICU on dopamine. PCCM to assume care until stabilized.   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, no acute findings R hand XR 5/13 >> dorsal swelling and gas, extending into 4 digits L hip XR 5/13 >> no acute fx, osteopenia L spine 5/13 >> no fx or acute abnormality Echo 5/16 >>  SIGNIFICANT EVENTS: R hand surgery debridement 5/13 OR debridement 5/15 Back on Neosynephrine late 5/15  Subjective: Back on Neosynephrine late 5/15, had surgery yesterday   VITAL SIGNS: Temp:  [97.3 F (36.3 C)-98.6 F (37 C)] 97.4 F (36.3 C) (05/16 1100) Pulse Rate:  [52-106] 76 (05/16 1200) Resp:  [8-25] 12 (05/16 1200) BP: (37-135)/(11-90) 101/46 mmHg (05/16 1200) SpO2:  [68 %-100 %] 100 % (05/16 1200)  Room air  HEMODYNAMICS:   VENTILATOR SETTINGS:   INTAKE / OUTPUT:  Intake/Output Summary (Last 24 hours) at 09/23/14 1220 Last data filed at 09/23/14 1200  Gross per 24 hour  Intake    943 ml  Output      0 ml  Net    943 ml    PHYSICAL EXAMINATION: General:  Awake, alert, confused, no distress HENT: NCAT EOMi PULM: CTA B CV: RRR distant GI: BS+, soft, nontender MSK: s/p bilat AKA, R hand dressing in place Neuro Awake, follows commands, confused to place, situation  LABS:  CBC  Recent Labs Lab 09/21/14 0425 09/22/14 0242 09/22/14 0927 09/23/14 0754  WBC 16.3* 14.4*  --  13.4*  HGB 13.4 11.8* 13.9 12.3*  HCT 38.6* 35.4* 41.0 35.8*  PLT 82* 97*  --  104*   Coag's No results for input(s): APTT, INR in the last 168 hours. BMET  Recent Labs Lab 09/21/14 0426 09/22/14 0927  09/22/14 1316 09/23/14 0754  NA 135 135 133* 132*  K 4.0 4.6 4.6 5.1  CL 96*  --  95* 95*  CO2 23  --  22 22  BUN 56*  --  56* 62*  CREATININE 7.54*  --  7.99* 8.53*  GLUCOSE 101* 152* 163* 138*   Electrolytes  Recent Labs Lab 09/20/14 1930 09/21/14 0426 09/22/14 1316 09/23/14 0754  CALCIUM 9.5 8.8* 8.2* 7.7*  MG  --  2.0  --   --   PHOS 8.4* 5.9* 6.5*  --    Sepsis Markers  Recent Labs Lab 09/21/14 1532 09/22/14 1816 09/23/14 0754  LATICACIDVEN 3.1* 3.1* 2.1*   ABG No results for input(s): PHART, PCO2ART, PO2ART in the last 168 hours. Liver Enzymes  Recent Labs Lab 09/20/14 0636  09/21/14 0426 09/22/14 1316 09/23/14 0754  AST 76*  --  79*  --  40  ALT 17  --  16*  --  15*  ALKPHOS 146*  --  136*  --  114  BILITOT 3.3*  --  2.8*  --  3.1*  ALBUMIN 3.0*  < > 2.4* 2.0* 2.0*  < > = values in this interval not displayed. Cardiac Enzymes No results for input(s): TROPONINI, PROBNP in the last 168 hours. Glucose  Recent Labs Lab 09/22/14 1240 09/22/14 1549 09/22/14 2000 09/22/14 2356 09/23/14  0413 09/23/14 0713  GLUCAP 160* 148* 131* 111* 119* 132*    Imaging 5/13 hand films personally reviewed > four digit hand, gas present Op note from 5/15 reviewed> purulent drainage debrided   ASSESSMENT / PLAN:  PULMONARY OETT 5/13 >> 5/13 (surgery only) A:  No acute issues P:   Incentive spirometry Monitor O2 saturation  CARDIOVASCULAR CVL A:  Shock, septic compounded by sedation/pain meds; has chronic hypotension at home (on midodrine)> even though he is back on neo 5/16 favor med related as lactic acid clearing Severe PVD CAD HTN Ischemic CM P:  Continue neo SBP > 90 Tele F/U echo Continue midodrine  RENAL A:  ESRD on HD P:   Renal service following restart sensipar, MVI, sevelamer on 5/14 HD today  GASTROINTESTINAL A:  Chronic diarrhea P:   Continue home doses of bentyl and lomotil  HEMATOLOGIC A:  Leukocytosis  Anemia of  chronic disease P:  Follow CBC  INFECTIOUS A:  R hand gas gangrene P:   BCx2 5/13 >>  UC 5/13 >>  R hand wound 5/13 >> w/ gpc clusters and chains>>> group b strep Hand wound 5/15 > GPC Vanco 5/13 >>  Zosyn 5/13 >>  S/p debridement 5/13, plan narrow abx based on cx data   ENDOCRINE A:  DM   P:   Start SSI protocol  NEUROLOGIC A:  No acute issues P:   Pain control, judicious w narcs   FAMILY  - Updates:   - Inter-disciplinary family meet or Palliative Care meeting due by:  5/20    TODAY'S SUMMARY:  Back on vasopressors but I think this is chronic and not necessarily acute, hypotension worsened by decreased PO intake, pain meds, etc.  Continue pressors for SBP > 80, back to OR today, will place CVL to help manage pressors, f/u echo  Roselie Awkward, MD [place] [facility] Pager: [phone] Cell: [phone] If no response, call [phone]

## 2014-09-23 NOTE — Evaluation (Addendum)
Occupational Therapy Evaluation Patient Details Name: Julian [name] MRN: [id number] DOB: May 02, 1946 Today's Date: 09/23/2014    History of Present Illness 69 y.o. 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 Pt returned to ED on 5/13 with continued falls. During work up, hand noted to be actively draining and purulent. Pt s/p I&D of right hand.   Clinical Impression   Pt admitted with above. Pt had aide assist with ADLs, PTA. Feel pt will benefit from acute OT to increase independence, activity tolerance, strength, and address right upper extremity prior to d/c. Recommending SNF for d/c.     Follow Up Recommendations  SNF;Supervision/Assistance - 24 hour    Equipment Recommendations  Other (comment) (defer to next venue)    Recommendations for Other Services PT consult     Precautions / Restrictions Precautions Precautions: Fall Restrictions Weight Bearing Restrictions: Yes RUE Weight Bearing: Weight bear through elbow only      Mobility Bed Mobility Overal bed mobility: Needs Assistance;+2 for physical assistance Bed Mobility: Rolling;Sit to Supine;Sidelying to Sit Rolling: +2 for physical assistance;Max assist Sidelying to sit: +2 for physical assistance;Max assist   Sit to supine: +2 for physical assistance;Max assist;HOB elevated   General bed mobility comments: cues for technique. Heavy assist.  Transfers                 General transfer comment: unable    Balance Overall balance assessment: Needs assistance Sitting-balance support: Single extremity supported-Pt initially requiring Mod assist for balance sitting EOB and then progressed to supervision level sitting EOB.                                         ADL Overall ADL's : Needs assistance/impaired                 Upper Body Dressing : Maximal assistance;Moderate assistance;Sitting;Bed level   Lower Body Dressing: +2 for physical assistance;Total assistance;Sitting/lateral leans;Bed level   Toilet Transfer:  (not assessed)   Toileting- Clothing Manipulation and Hygiene: Total assistance;Bed level;+2 for physical assistance         General ADL Comments: Pt sat EOB and performed UE exercises.  Elevated right upper extremity at end of session on pillows. Cues for NWB of right hand/forearm in session. Time spent trying to use bedpan and urinal in bed.     Vision     Perception     Praxis      Pertinent Vitals/Pain  No/denies pain (no pain reported)  BP in supine at beginning of session 114/82 and 101/60 sitting EOB. BP fluctuating in session. Notified nurse.     Hand Dominance     Extremity/Trunk Assessment Upper Extremity Assessment Upper Extremity Assessment: Generalized weakness;RUE deficits/detail RUE: Unable to fully assess due to immobilization RUE Sensation: decreased light touch   Lower Extremity Assessment Lower Extremity Assessment: Defer to PT evaluation       Communication Communication Communication: Expressive difficulties (difficult to understand)   Cognition Arousal/Alertness: Lethargic (eyes closed for most of session) Behavior During Therapy: [facility] for tasks assessed/performed Overall Cognitive Status: No family/caregiver present to determine baseline cognitive functioning  General Comments       Exercises Exercises: Other exercises Other Exercises Other Exercises: bilateral shoulder flexion   Shoulder Instructions      Home Living Family/patient expects to be discharged to:: Other (Comment)                                 Additional Comments: per previous evaluation, pt has aide 7 days a week for around 6-8 hours per day       Prior Functioning/Environment Level of Independence: Needs assistance    ADL's / Homemaking Assistance Needed: per previous evaluation, aide assists with bathing, dressing, toileting, meals (pt reported in session that he has assist with bathing/dressing)        OT Diagnosis: Generalized weakness   OT Problem List: Decreased knowledge of use of DME or AE;Decreased knowledge of precautions;Decreased strength;Decreased range of motion;Impaired balance (sitting and/or standing);Decreased cognition;Decreased coordination;Impaired sensation;Impaired UE functional use   OT Treatment/Interventions: Self-care/ADL training;Therapeutic exercise;DME and/or AE instruction;Therapeutic activities;Cognitive remediation/compensation;Patient/family education;Balance training    OT Goals(Current goals can be found in the care plan section) Acute Rehab OT Goals Patient Stated Goal: not stated OT Goal Formulation: Patient unable to participate in goal setting Time For Goal Achievement: 10/07/14 Potential to Achieve Goals: Fair ADL Goals Pt Will Perform Upper Body Bathing: with min assist;sitting Pt Will Perform Upper Body Dressing: with min assist;sitting Additional ADL Goal #1: Pt will perform bed mobility at Mod A  level as precursor for ADLs. Additional ADL Goal #2: Pt will be independent with edema management techniques for right upper extremity. Additional ADL Goal #3: Pt will perform HEP for bilateral upper extremities with supervision.  OT Frequency: Min 2X/week   Barriers to D/C:            Co-evaluation              End of Session Nurse Communication: Mobility status;Other (comment) (BP)  Activity Tolerance: Patient tolerated treatment well Patient left: in bed;with call bell/phone within reach   Time: 1432-1515 OT Time Calculation (min): 43 min Charges:  OT General Charges $OT Visit: 1 Procedure OT Evaluation $Initial OT Evaluation Tier I: 1 Procedure OT  Treatments $Therapeutic Activity: 8-22 mins G-CodesBenito Mccreedy OTR/L [phone] 09/23/2014, 3:57 PM

## 2014-09-23 NOTE — Progress Notes (Signed)
Echocardiogram 2D Echocardiogram has been performed.  Tresa Res 09/23/2014, 10:32 AM

## 2014-09-23 NOTE — Clinical Social Work Note (Signed)
CSW consult acknowledged:  Clinical Social Worker received a consult in reference to possible SNF placement. Patient has had a bilateral amputation and will likely need SNF placement. Patient scheduled for surgery today, 5/16.   Patient active with CAPS program however will likely require a higher level of care after surgery. CSW will continue to follow patient for disposition needs after sugery.   CSW remains available.   Glendon Axe, MSW, LCSWA [phone] 09/23/2014 11:52 AM

## 2014-09-23 NOTE — Procedures (Signed)
Central Venous Catheter Insertion Procedure Note Julian [name] [id number] 07/14/1945  Procedure: Insertion of Central Venous Catheter Indications: Assessment of intravascular volume and Frequent blood sampling  Procedure Details Consent: Risks of procedure as well as the alternatives and risks of each were explained to the (patient/caregiver).  Consent for procedure obtained. Time Out: Verified patient identification, verified procedure, site/side was marked, verified correct patient position, special equipment/implants available, medications/allergies/relevent history reviewed, required imaging and test results available.  Performed  Maximum sterile technique was used including antiseptics, cap, gloves, gown, hand hygiene, mask and sheet. Skin prep: Chlorhexidine; local anesthetic administered A antimicrobial bonded/coated triple lumen catheter was placed in the left internal jugular vein using the Seldinger technique. Ultrasound guidance used.Yes.   Catheter placed to 20 cm. Blood aspirated via all 3 ports and then flushed x 3. Line sutured x 2 and dressing applied.  Evaluation Blood flow good Complications: No apparent complications Patient did tolerate procedure well. Chest X-ray ordered to verify placement.  CXR: pending.  Georgann Housekeeper, AGACNP-BC [facility] Pulmonology/Critical Care Pager [phone] or [phone]  09/23/2014 1:59 PM

## 2014-09-23 NOTE — Procedures (Signed)
I was present at this session.  I have reviewed the session itself and made appropriate changes.  HD via  UA AVF. ? Accuracy of bp, as is mentating with low bp.  tol well.  Access press ok.    Ameri Cahoon L 5/16/201611:58 AM

## 2014-09-23 NOTE — Progress Notes (Signed)
Subjective: Interval History: has no complaint.  Objective: Vital signs in last 24 hours: Temp:  [97.3 F (36.3 C)-98.2 F (36.8 C)] 98.2 F (36.8 C) (05/16 0414) Pulse Rate:  [52-71] 60 (05/16 0550) Resp:  [8-25] 25 (05/16 0550) BP: (37-135)/(11-90) 83/26 mmHg (05/16 0550) SpO2:  [88 %-100 %] 100 % (05/16 0550) Weight change:   Intake/Output from previous day: 05/15 0701 - 05/16 0700 In: 951.5 [I.V.:831.5; IV Piggyback:120] Out: -  Intake/Output this shift: Total I/O In: 225.6 [I.V.:155.6; IV Piggyback:70] Out: -   General appearance: cooperative, no distress and morbidly obese Resp: diminished breath sounds bilaterally Cardio: S1, S2 normal and friction rub heard at apex GI: obese, liver down 6 cm, soft. pos bs Extremities: bilat AKA, AVF RUA  Lab Results:  Recent Labs  09/21/14 0425 09/22/14 0242 09/22/14 0927  WBC 16.3* 14.4*  --   HGB 13.4 11.8* 13.9  HCT 38.6* 35.4* 41.0  PLT 82* 97*  --    BMET:  Recent Labs  09/21/14 0426 09/22/14 0927 09/22/14 1316  NA 135 135 133*  K 4.0 4.6 4.6  CL 96*  --  95*  CO2 23  --  22  GLUCOSE 101* 152* 163*  BUN 56*  --  56*  CREATININE 7.54*  --  7.99*  CALCIUM 8.8*  --  8.2*   No results for input(s): PTH in the last 72 hours. Iron Studies: No results for input(s): IRON, TIBC, TRANSFERRIN, FERRITIN in the last 72 hours.  Studies/Results: No results found.  I have reviewed the patient's current medications.  Assessment/Plan: 1 ESRD for HD.  Vol ok.  Has rub.  No hep 2 Anemai stable 3 Hand gangrene per Hand, on 2 AB 4 DM controlled 5 PVD 6 CAD 7 HPTH on meds. 8  Pericardial rub needs eval 9 Obesity 10 Low bp on PE ? Role of pericardium P 2 D, HD, AB  pressors    LOS: 3 days   Valen Gillison L 09/23/2014,6:47 AM

## 2014-09-23 NOTE — Care Management Note (Signed)
Case Management Note  Patient Details  Name: Julian [name] MRN: [id number] Date of Birth: [birth year]  Subjective/Objective:                    Action/Plan:   Expected Discharge Date:                  Expected Discharge Plan:  Skilled Nursing Facility  In-House Referral:  Clinical Social Work  Discharge planning Services  CM Consult  Post Acute Care Choice:  Resumption of Svcs/PTA Provider Choice offered to:     DME Arranged:    DME Agency:     HH Arranged:  CAPS Program [place] Agency:     Status of Service:     Medicare Important Message Given:  Yes Date Medicare IM Given:  09/23/14 Medicare IM give by:  debbie Brendyn Mclaren rn,bsn Date Additional Medicare IM Given:    Additional Medicare Important Message give by:     If discussed at [place] of Stay Meetings, dates discussed:    Additional Comments:capd case manager jula [phone] called. They are concerned that if has some of hand removed pt lives alone and only has caps and may need snf. sw ref made.  Lacretia Leigh, RN 09/23/2014, 11:44 AM

## 2014-09-24 ENCOUNTER — Encounter (HOSPITAL_COMMUNITY)
Admission: EM | Disposition: A | Discharge status: Skilled Nursing Facility | Payer: Self-pay | Source: Home / Self Care | Attending: Pulmonary Disease

## 2014-09-24 ENCOUNTER — Encounter (HOSPITAL_COMMUNITY): Payer: Self-pay | Admitting: Certified Registered Nurse Anesthetist

## 2014-09-24 ENCOUNTER — Inpatient Hospital Stay (HOSPITAL_COMMUNITY): Payer: Medicare Other | Admitting: Anesthesiology

## 2014-09-24 ENCOUNTER — Inpatient Hospital Stay (HOSPITAL_COMMUNITY): Admission: RE | Admit: 2014-09-24 | Payer: Medicare Other | Source: Ambulatory Visit

## 2014-09-24 ENCOUNTER — Ambulatory Visit: Payer: Medicare Other | Admitting: Vascular Surgery

## 2014-09-24 DIAGNOSIS — R6521 Severe sepsis with septic shock: Secondary | ICD-10-CM

## 2014-09-24 DIAGNOSIS — I429 Cardiomyopathy, unspecified: Secondary | ICD-10-CM

## 2014-09-24 HISTORY — PX: I&D EXTREMITY: SHX5045

## 2014-09-24 HISTORY — PX: AMPUTATION: SHX166

## 2014-09-24 LAB — RENAL FUNCTION PANEL
Albumin: 2.3 g/dL — ABNORMAL LOW (ref 3.5–5.0)
Albumin: 2.3 g/dL — ABNORMAL LOW (ref 3.5–5.0)
Anion gap: 14 (ref 5–15)
Anion gap: 13 (ref 5–15)
BUN: 35 mg/dL — AB (ref 6–20)
BUN: 36 mg/dL — AB (ref 6–20)
CALCIUM: 8.1 mg/dL — AB (ref 8.9–10.3)
CALCIUM: 8.3 mg/dL — AB (ref 8.9–10.3)
CO2: 24 mmol/L (ref 22–32)
CO2: 24 mmol/L (ref 22–32)
CREATININE: 6.02 mg/dL — AB (ref 0.61–1.24)
CREATININE: 6.22 mg/dL — AB (ref 0.61–1.24)
Chloride: 95 mmol/L — ABNORMAL LOW (ref 101–111)
Chloride: 95 mmol/L — ABNORMAL LOW (ref 101–111)
GFR calc Af Amer: 10 mL/min — ABNORMAL LOW (ref >60)
GFR calc Af Amer: 10 mL/min — ABNORMAL LOW (ref >60)
GFR calc non Af Amer: 9 mL/min — ABNORMAL LOW (ref >60)
GFR calc non Af Amer: 8 mL/min — ABNORMAL LOW (ref >60)
GLUCOSE: 191 mg/dL — AB (ref 65–99)
Glucose, Bld: 214 mg/dL — ABNORMAL HIGH (ref 65–99)
PHOSPHORUS: 5.1 mg/dL — AB (ref 2.5–4.6)
Phosphorus: 5 mg/dL — ABNORMAL HIGH (ref 2.5–4.6)
Potassium: 4.3 mmol/L (ref 3.5–5.1)
Potassium: 4.4 mmol/L (ref 3.5–5.1)
Sodium: 133 mmol/L — ABNORMAL LOW (ref 135–145)
Sodium: 132 mmol/L — ABNORMAL LOW (ref 135–145)

## 2014-09-24 LAB — CBC
HCT: 33.3 % — ABNORMAL LOW (ref 39.0–52.0)
HCT: 34.4 % — ABNORMAL LOW (ref 39.0–52.0)
HEMOGLOBIN: 11.6 g/dL — AB (ref 13.0–17.0)
Hemoglobin: 11.2 g/dL — ABNORMAL LOW (ref 13.0–17.0)
MCH: 29.8 pg (ref 26.0–34.0)
MCH: 30.1 pg (ref 26.0–34.0)
MCHC: 33.6 g/dL (ref 30.0–36.0)
MCHC: 33.7 g/dL (ref 30.0–36.0)
MCV: 88.6 fL (ref 78.0–100.0)
MCV: 89.1 fL (ref 78.0–100.0)
PLATELETS: 78 10*3/uL — AB (ref 150–400)
Platelets: 81 10*3/uL — ABNORMAL LOW (ref 150–400)
RBC: 3.76 MIL/uL — ABNORMAL LOW (ref 4.22–5.81)
RBC: 3.86 MIL/uL — ABNORMAL LOW (ref 4.22–5.81)
RDW: 17.5 % — AB (ref 11.5–15.5)
RDW: 17.2 % — ABNORMAL HIGH (ref 11.5–15.5)
WBC: 9.6 10*3/uL (ref 4.0–10.5)
WBC: 10.3 10*3/uL (ref 4.0–10.5)

## 2014-09-24 LAB — GLUCOSE, CAPILLARY
GLUCOSE-CAPILLARY: 154 mg/dL — AB (ref 65–99)
GLUCOSE-CAPILLARY: 177 mg/dL — AB (ref 65–99)
GLUCOSE-CAPILLARY: 178 mg/dL — AB (ref 65–99)
GLUCOSE-CAPILLARY: 142 mg/dL — AB (ref 65–99)
GLUCOSE-CAPILLARY: 142 mg/dL — AB (ref 65–99)
Glucose-Capillary: 162 mg/dL — ABNORMAL HIGH (ref 65–99)

## 2014-09-24 LAB — LACTIC ACID, PLASMA: LACTIC ACID, VENOUS: 2.6 mmol/L — AB (ref 0.5–2.0)

## 2014-09-24 SURGERY — IRRIGATION AND DEBRIDEMENT EXTREMITY
Anesthesia: General | Site: Arm Lower | Laterality: Right

## 2014-09-24 MED ORDER — LIDOCAINE-PRILOCAINE 2.5-2.5 % EX CREA: 1.0000 "application " | TOPICAL_CREAM | CUTANEOUS | Status: DC | PRN

## 2014-09-24 MED ORDER — FENTANYL CITRATE (PF) 100 MCG/2ML IJ SOLN
25.0000 ug | INTRAMUSCULAR | Status: DC | PRN
  Administered 2014-09-24: 25 ug via INTRAVENOUS

## 2014-09-24 MED ORDER — SUCCINYLCHOLINE CHLORIDE 20 MG/ML IJ SOLN
INTRAMUSCULAR | Status: DC | PRN
  Administered 2014-09-24: 160 mg via INTRAVENOUS

## 2014-09-24 MED ORDER — PENTAFLUOROPROP-TETRAFLUOROETH EX AERO: 1.0000 "application " | INHALATION_SPRAY | CUTANEOUS | Status: DC | PRN

## 2014-09-24 MED ORDER — BUPIVACAINE HCL (PF) 0.25 % IJ SOLN
INTRAMUSCULAR | Status: AC
  Filled 2014-09-24: qty 30

## 2014-09-24 MED ORDER — ALTEPLASE 2 MG IJ SOLR
2.0000 mg | Freq: Once | INTRAMUSCULAR | Status: AC | PRN
  Filled 2014-09-24: qty 2

## 2014-09-24 MED ORDER — MIDAZOLAM HCL 2 MG/2ML IJ SOLN
0.5000 mg | Freq: Once | INTRAMUSCULAR | Status: AC | PRN
  Administered 2014-09-24: 0.25 mg via INTRAVENOUS

## 2014-09-24 MED ORDER — BUPIVACAINE HCL (PF) 0.25 % IJ SOLN
INTRAMUSCULAR | Status: DC | PRN
  Administered 2014-09-24: 10 mL

## 2014-09-24 MED ORDER — 0.9 % SODIUM CHLORIDE (POUR BTL) OPTIME
TOPICAL | Status: DC | PRN
  Administered 2014-09-24: 1000 mL

## 2014-09-24 MED ORDER — PHENYLEPHRINE HCL 10 MG/ML IJ SOLN
0.0000 ug/min | INTRAMUSCULAR | Status: DC
  Administered 2014-09-24 – 2014-09-25 (×2): 60 ug/min via INTRAVENOUS
  Administered 2014-09-25: 75 ug/min via INTRAVENOUS
  Administered 2014-09-26: 70 ug/min via INTRAVENOUS
  Filled 2014-09-24 (×10): qty 4

## 2014-09-24 MED ORDER — HEPARIN SODIUM (PORCINE) 1000 UNIT/ML DIALYSIS: 1000.0000 [IU] | INTRAMUSCULAR | Status: DC | PRN

## 2014-09-24 MED ORDER — ONDANSETRON HCL 4 MG/2ML IJ SOLN
INTRAMUSCULAR | Status: DC | PRN
  Administered 2014-09-24: 4 mg via INTRAVENOUS

## 2014-09-24 MED ORDER — ALBUMIN HUMAN 25 % IV SOLN
50.0000 g | Freq: Once | INTRAVENOUS | Status: AC
  Administered 2014-09-24: 50 g via INTRAVENOUS
  Filled 2014-09-24: qty 200

## 2014-09-24 MED ORDER — MIDAZOLAM HCL 2 MG/2ML IJ SOLN
INTRAMUSCULAR | Status: AC
  Filled 2014-09-24: qty 2

## 2014-09-24 MED ORDER — PHENYLEPHRINE HCL 10 MG/ML IJ SOLN
INTRAMUSCULAR | Status: DC | PRN
  Administered 2014-09-24 (×2): 80 ug via INTRAVENOUS

## 2014-09-24 MED ORDER — PROMETHAZINE HCL 25 MG/ML IJ SOLN
6.2500 mg | INTRAMUSCULAR | Status: DC | PRN
Start: 2014-09-24 — End: 2014-09-24

## 2014-09-24 MED ORDER — MEPERIDINE HCL 25 MG/ML IJ SOLN: 6.2500 mg | INTRAMUSCULAR | Status: DC | PRN

## 2014-09-24 MED ORDER — FENTANYL CITRATE (PF) 100 MCG/2ML IJ SOLN
INTRAMUSCULAR | Status: DC | PRN
  Administered 2014-09-24: 25 ug via INTRAVENOUS
  Administered 2014-09-24: 75 ug via INTRAVENOUS
  Administered 2014-09-24 (×2): 50 ug via INTRAVENOUS

## 2014-09-24 MED ORDER — ETOMIDATE 2 MG/ML IV SOLN
INTRAVENOUS | Status: DC | PRN
  Administered 2014-09-24: 16 mg via INTRAVENOUS

## 2014-09-24 MED ORDER — LIDOCAINE HCL (PF) 1 % IJ SOLN: 5.0000 mL | INTRAMUSCULAR | Status: DC | PRN

## 2014-09-24 MED ORDER — FENTANYL CITRATE (PF) 250 MCG/5ML IJ SOLN
INTRAMUSCULAR | Status: AC
  Filled 2014-09-24: qty 5

## 2014-09-24 MED ORDER — PROPOFOL 10 MG/ML IV BOLUS
INTRAVENOUS | Status: AC
  Filled 2014-09-24: qty 20

## 2014-09-24 MED ORDER — SODIUM CHLORIDE 0.9 % IV SOLN
100.0000 mL | INTRAVENOUS | Status: DC | PRN
  Administered 2014-09-24: 17:00:00 via INTRAVENOUS

## 2014-09-24 MED ORDER — FENTANYL CITRATE (PF) 100 MCG/2ML IJ SOLN
INTRAMUSCULAR | Status: AC
  Filled 2014-09-24: qty 2

## 2014-09-24 MED ORDER — HEPARIN SODIUM (PORCINE) 1000 UNIT/ML DIALYSIS
100.0000 [IU]/kg | INTRAMUSCULAR | Status: DC | PRN
  Filled 2014-09-24: qty 11

## 2014-09-24 MED ORDER — SODIUM CHLORIDE 0.9 % IV SOLN: 100.0000 mL | INTRAVENOUS | Status: DC | PRN

## 2014-09-24 MED ORDER — EPHEDRINE SULFATE 50 MG/ML IJ SOLN
INTRAMUSCULAR | Status: DC | PRN
  Administered 2014-09-24 (×2): 10 mg via INTRAVENOUS

## 2014-09-24 MED ORDER — NEPRO/CARBSTEADY PO LIQD: 237.0000 mL | ORAL | Status: DC | PRN

## 2014-09-24 SURGICAL SUPPLY — 67 items
BANDAGE ELASTIC 3 VELCRO ST LF (GAUZE/BANDAGES/DRESSINGS) IMPLANT
BANDAGE ELASTIC 4 VELCRO ST LF (GAUZE/BANDAGES/DRESSINGS) ×9 IMPLANT
BLADE SAW SGTL HD 18.5X60.5X1. (BLADE) ×3 IMPLANT
BLADE SURG 10 STRL SS (BLADE) IMPLANT
BNDG CMPR 9X4 STRL LF SNTH (GAUZE/BANDAGES/DRESSINGS) ×1
BNDG COHESIVE 4X5 TAN STRL (GAUZE/BANDAGES/DRESSINGS) IMPLANT
BNDG CONFORM 2 STRL LF (GAUZE/BANDAGES/DRESSINGS) IMPLANT
BNDG ESMARK 4X9 LF (GAUZE/BANDAGES/DRESSINGS) ×3 IMPLANT
BNDG GAUZE ELAST 4 BULKY (GAUZE/BANDAGES/DRESSINGS) ×6 IMPLANT
CANISTER SUCTION WELLS/JOHNSON (MISCELLANEOUS) ×3 IMPLANT
CLIP TI MEDIUM 24 (CLIP) ×3 IMPLANT
CLIP TI MEDIUM 6 (CLIP) ×3 IMPLANT
CLIP TI WIDE RED SMALL 6 (CLIP) ×3 IMPLANT
CORDS BIPOLAR (ELECTRODE) ×3 IMPLANT
COVER SURGICAL LIGHT HANDLE (MISCELLANEOUS) ×3 IMPLANT
CUFF TOURNIQUET SINGLE 18IN (TOURNIQUET CUFF) ×3 IMPLANT
DRAIN PENROSE 1/4X12 LTX STRL (WOUND CARE) IMPLANT
DRAPE SURG 17X23 STRL (DRAPES) ×3 IMPLANT
DRAPE U-SHAPE 47X51 STRL (DRAPES) IMPLANT
DRSG ADAPTIC 3X8 NADH LF (GAUZE/BANDAGES/DRESSINGS) IMPLANT
DRSG PAD ABDOMINAL 8X10 ST (GAUZE/BANDAGES/DRESSINGS) ×3 IMPLANT
DURAPREP 26ML APPLICATOR (WOUND CARE) IMPLANT
ELECT REM PT RETURN 9FT ADLT (ELECTROSURGICAL) ×3
ELECTRODE REM PT RTRN 9FT ADLT (ELECTROSURGICAL) ×1 IMPLANT
GAUZE PACKING IODOFORM 1/4X5 (PACKING) IMPLANT
GAUZE SPONGE 4X4 12PLY STRL (GAUZE/BANDAGES/DRESSINGS) ×3 IMPLANT
GAUZE SPONGE 4X4 16PLY XRAY LF (GAUZE/BANDAGES/DRESSINGS) ×3 IMPLANT
GAUZE XEROFORM 1X8 LF (GAUZE/BANDAGES/DRESSINGS) IMPLANT
GAUZE XEROFORM 5X9 LF (GAUZE/BANDAGES/DRESSINGS) ×3 IMPLANT
GLOVE BIOGEL PI IND STRL 9 (GLOVE) IMPLANT
GLOVE BIOGEL PI INDICATOR 9 (GLOVE)
GLOVE SURG ORTHO 9.0 STRL STRW (GLOVE) IMPLANT
GLOVE SURG SS PI 6.5 STRL IVOR (GLOVE) ×3 IMPLANT
GLOVE SURG SYN 8.0 (GLOVE) ×3 IMPLANT
GOWN STRL REUS W/ TWL LRG LVL3 (GOWN DISPOSABLE) ×1 IMPLANT
GOWN STRL REUS W/ TWL XL LVL3 (GOWN DISPOSABLE) ×1 IMPLANT
GOWN STRL REUS W/TWL LRG LVL3 (GOWN DISPOSABLE) ×3
GOWN STRL REUS W/TWL XL LVL3 (GOWN DISPOSABLE) ×3
HANDPIECE INTERPULSE COAX TIP (DISPOSABLE)
KIT BASIN OR (CUSTOM PROCEDURE TRAY) ×3 IMPLANT
KIT ROOM TURNOVER OR (KITS) ×3 IMPLANT
MANIFOLD NEPTUNE II (INSTRUMENTS) IMPLANT
NEEDLE HYPO 25GX1X1/2 BEV (NEEDLE) ×3 IMPLANT
NEEDLE HYPO 25X1 1.5 SAFETY (NEEDLE) IMPLANT
NS IRRIG 1000ML POUR BTL (IV SOLUTION) ×3 IMPLANT
PACK ORTHO EXTREMITY (CUSTOM PROCEDURE TRAY) ×3 IMPLANT
PAD ARMBOARD 7.5X6 YLW CONV (MISCELLANEOUS) ×6 IMPLANT
PAD CAST 4YDX4 CTTN HI CHSV (CAST SUPPLIES) ×3 IMPLANT
PADDING CAST COTTON 4X4 STRL (CAST SUPPLIES) ×9
SET HNDPC FAN SPRY TIP SCT (DISPOSABLE) IMPLANT
SPONGE LAP 18X18 X RAY DECT (DISPOSABLE) IMPLANT
STAPLER VISISTAT 35W (STAPLE) ×3 IMPLANT
SUCTION FRAZIER TIP 10 FR DISP (SUCTIONS) ×3 IMPLANT
SUT ETHILON 2 0 PSLX (SUTURE) IMPLANT
SUT VIC AB 2-0 CTB1 (SUTURE) IMPLANT
SUT VIC AB 2-0 SH 27 (SUTURE) ×6
SUT VIC AB 2-0 SH 27XBRD (SUTURE) ×2 IMPLANT
SUT VICRYL RAPIDE 4/0 PS 2 (SUTURE) IMPLANT
SYR 20CC LL (SYRINGE) IMPLANT
SYR CONTROL 10ML LL (SYRINGE) ×3 IMPLANT
TOWEL OR 17X24 6PK STRL BLUE (TOWEL DISPOSABLE) ×3 IMPLANT
TOWEL OR 17X26 10 PK STRL BLUE (TOWEL DISPOSABLE) ×3 IMPLANT
TUBE ANAEROBIC SPECIMEN COL (MISCELLANEOUS) IMPLANT
TUBE CONNECTING 12'X1/4 (SUCTIONS) ×1
TUBE CONNECTING 12X1/4 (SUCTIONS) ×2 IMPLANT
UNDERPAD 30X30 INCONTINENT (UNDERPADS AND DIAPERS) ×3 IMPLANT
YANKAUER SUCT BULB TIP NO VENT (SUCTIONS) ×3 IMPLANT

## 2014-09-24 NOTE — Anesthesia Preprocedure Evaluation (Addendum)
Anesthesia Evaluation  Patient identified by MRN, date of birth, ID band Patient awake    Reviewed: Allergy & Precautions, NPO status , Patient's Chart, lab work & pertinent test results  History of Anesthesia Complications Negative for: history of anesthetic complications  Airway Mallampati: II  TM Distance: >3 FB Neck ROM: Full    Dental  (+) Poor Dentition, Loose, Dental Advisory Given, Missing   Pulmonary neg pulmonary ROS,  breath sounds clear to auscultation        Cardiovascular hypertension, Pt. on medications and Pt. on home beta blockers + CAD, + Cardiac Stents and + Peripheral Vascular Disease + dysrhythmias Rhythm:Regular Rate:Normal  Hypotensive, requiring phenylephrine support 09/23/14 ECHO: EF 25-30%, valves OK    Neuro/Psych negative neurological ROS     GI/Hepatic negative GI ROS, Neg liver ROS,   Endo/Other  diabetes (glu 178), Insulin DependentMorbid obesity  Renal/GU Dialysis and ESRFRenal disease (MWF dialysis, K+ 4.4)     Musculoskeletal Necrotizing fasciitis of arm: phenylephrine   Abdominal (+) + obese,   Peds  Hematology  (+) Blood dyscrasia (Hb 11.6, plt 81K), ,   Anesthesia Other Findings   Reproductive/Obstetrics                          Anesthesia Physical Anesthesia Plan  ASA: IV  Anesthesia Plan: General   Post-op Pain Management:    Induction: Intravenous  Airway Management Planned: Oral ETT and Video Laryngoscope Planned  Additional Equipment:   Intra-op Plan:   Post-operative Plan: Possible Post-op intubation/ventilation  Informed Consent: I have reviewed the patients History and Physical, chart, labs and discussed the procedure including the risks, benefits and alternatives for the proposed anesthesia with the patient or authorized representative who has indicated his/her understanding and acceptance.   Dental advisory given  Plan Discussed  with: CRNA and Surgeon  Anesthesia Plan Comments: (Plan routine monitors, GETA with VideoGlide intubation)        Anesthesia Quick Evaluation

## 2014-09-24 NOTE — Progress Notes (Signed)
Patient underwent right distal forearm level amputation for severe dorsal and volar infection  Will need dressing change in 5-7 days

## 2014-09-24 NOTE — Anesthesia Procedure Notes (Signed)
Procedure Name: Intubation Date/Time: 09/24/2014 5:30 PM Performed by: Shirlyn Goltz Pre-anesthesia Checklist: Patient identified, Emergency Drugs available, Suction available and Patient being monitored Patient Re-evaluated:Patient Re-evaluated prior to inductionOxygen Delivery Method: Circle system utilized Preoxygenation: Pre-oxygenation with 100% oxygen Intubation Type: IV induction Ventilation: Mask ventilation without difficulty Laryngoscope Size: Glidescope Grade View: Grade I Tube type: Oral Tube size: 7.0 mm Number of attempts: 1 Airway Equipment and Method: Stylet and Video-laryngoscopy Placement Confirmation: ETT inserted through vocal cords under direct vision,  positive ETCO2 and breath sounds checked- equal and bilateral Secured at: 22 cm Tube secured with: Tape Dental Injury: Teeth and Oropharynx as per pre-operative assessment

## 2014-09-24 NOTE — Op Note (Signed)
See note [phone]

## 2014-09-24 NOTE — Progress Notes (Signed)
PULMONARY / CRITICAL CARE MEDICINE   Name: Julian [name] MRN: [id number] DOB: 1946/01/12    ADMISSION DATE:  09/20/2014 CONSULTATION DATE:  09/20/14  REFERRING MD :  Dr Wyline Copas  CHIEF COMPLAINT:  Septic Shock  INITIAL PRESENTATION:  36 w ESRD on HD, severe PVD, DM. Transferred to [facility] for surgical debridement of R hand wound. He was hypotensive post-op and transferred to ICU on dopamine. PCCM to assume care until stabilized.   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, no acute findings R hand XR 5/13 >> dorsal swelling and gas, extending into 4 digits L hip XR 5/13 >> no acute fx, osteopenia L spine 5/13 >> no fx or acute abnormality Echo 5/16 >> LVEF 25-30%, RV mod dilated  SIGNIFICANT EVENTS: R hand surgery debridement 5/13 OR debridement 5/15 Back on Neosynephrine late 5/15  Subjective: remains on neosynephrine, did not go to OR 5/16  VITAL SIGNS: Temp:  [97.4 F (36.3 C)-98.7 F (37.1 C)] 98.4 F (36.9 C) (05/17 0755) Pulse Rate:  [65-88] 78 (05/17 1000) Resp:  [9-23] 20 (05/17 1000) BP: (51-150)/(29-119) 62/49 mmHg (05/17 1000) SpO2:  [87 %-100 %] 92 % (05/17 1000) Weight:  [104 kg (229 lb 4.5 oz)] 104 kg (229 lb 4.5 oz) (05/16 1202)  Room air  HEMODYNAMICS: CVP:  [15 mmHg] 15 mmHg VENTILATOR SETTINGS:   INTAKE / OUTPUT:  Intake/Output Summary (Last 24 hours) at 09/24/14 1055 Last data filed at 09/24/14 0800  Gross per 24 hour  Intake 554.34 ml  Output   2000 ml  Net -1445.66 ml    PHYSICAL EXAMINATION: General:  Awake, alert, more oriented today, no distress HENT: NCAT EOMi PULM: CTA B CV: RRR distant heart sounds GI: BS+, soft, nontender MSK: s/p bilat AKA, R hand dressing in place Neuro Awake, follows commands, oriented to situation  LABS:  CBC  Recent Labs Lab 09/23/14 0754 09/24/14 0505 09/24/14 1000  WBC 13.4* 9.6 10.3  HGB 12.3* 11.2* 11.6*  HCT 35.8* 33.3* 34.4*  PLT 104* 78* 81*   Coag's No results for input(s):  APTT, INR in the last 168 hours. BMET  Recent Labs Lab 09/22/14 1316 09/23/14 0754 09/24/14 0505  NA 133* 132* 133*  K 4.6 5.1 4.3  CL 95* 95* 95*  CO2 22 22 24   BUN 56* 62* 35*  CREATININE 7.99* 8.53* 6.02*  GLUCOSE 163* 138* 191*   Electrolytes  Recent Labs Lab 09/21/14 0426 09/22/14 1316 09/23/14 0754 09/24/14 0505  CALCIUM 8.8* 8.2* 7.7* 8.1*  MG 2.0  --   --   --   PHOS 5.9* 6.5*  --  5.0*   Sepsis Markers  Recent Labs Lab 09/21/14 1532 09/22/14 1816 09/23/14 0754  LATICACIDVEN 3.1* 3.1* 2.1*   ABG No results for input(s): PHART, PCO2ART, PO2ART in the last 168 hours. Liver Enzymes  Recent Labs Lab 09/20/14 0636  09/21/14 0426 09/22/14 1316 09/23/14 0754 09/24/14 0505  AST 76*  --  79*  --  40  --   ALT 17  --  16*  --  15*  --   ALKPHOS 146*  --  136*  --  114  --   BILITOT 3.3*  --  2.8*  --  3.1*  --   ALBUMIN 3.0*  < > 2.4* 2.0* 2.0* 2.3*  < > = values in this interval not displayed. Cardiac Enzymes No results for input(s): TROPONINI, PROBNP in the last 168 hours. Glucose  Recent Labs Lab 09/23/14 0413  09/23/14 0713 09/23/14 1112 09/23/14 1741 09/23/14 2321 09/24/14 0840  GLUCAP 119* 132* 101* 134* 162* 154*    Imaging 5/13 hand films personally reviewed > four digit hand, gas present Op note from 5/15 reviewed> purulent drainage debrided   ASSESSMENT / PLAN:  PULMONARY OETT 5/13 >> 5/13 (surgery only) A:  No acute issues P:   Incentive spirometry Monitor O2 saturation  CARDIOVASCULAR CVL A:  Shock, septic > persistent, lactic acid still mildly elevated; BP decreased at  [place], has been on midodrine since early 2016 Severe PVD CAD HTN Ischemic CM > 5/16 echo similar to prior study P:  Continue neo SBP > 90 Tele F/U echo Continue midodrine Repeat lactic acid, procalcitonin Consider florinef  RENAL A:  ESRD on HD P:   Renal service following continue sensipar, MVI, sevelamer on 5/14 when able to take  PO   GASTROINTESTINAL A:  Chronic diarrhea P:   Continue home doses of bentyl and lomotil  HEMATOLOGIC A:  Leukocytosis  Anemia of chronic disease P:  Follow CBC  INFECTIOUS A:  Sepsis cause by R hand gas gangrene P:   BCx2 5/13 >>  UC 5/13 >>  R hand wound 5/13 >> w/ gpc clusters and chains>>> group b strep Hand wound 5/15 > enterococcus Vanco 5/13 >>  Zosyn 5/13 >>  S/p debridement 5/13, plan narrow abx based on cx data; repeat debridement 5/16   ENDOCRINE A:  DM   P:   SSI protocol  NEUROLOGIC A:  No acute issues P:   Pain control, judicious w narcs   FAMILY  - Updates:   - Inter-disciplinary family meet or Palliative Care meeting due by:  5/20    TODAY'S SUMMARY:  Septic shock persists, still has elevated lactic acid, echo results reviewed He will definitely need assistance after leaving the hospital.  I'm hopeful he will wean off of pressors, but he has poor BP at baseline Will update family today Overall prognosis not great considering B AKA, ESRD, cardiomyopathy, now with R hand limitation.  Will discuss with family.  My cc time 40 minutes  Roselie Awkward, MD [place] [facility] Pager: [phone] Cell: [phone] If no response, call [phone]

## 2014-09-24 NOTE — Anesthesia Postprocedure Evaluation (Signed)
Anesthesia Post Note  Patient: Julian [name]  Procedure(s) Performed: Procedure(s) (LRB): REPEAT IRRIGATION AND DEBRIDEMENT RIGHT FOREARM (Right) AMPUTATION RIGHT DISTAL THIRD FOREARM (Right)  Anesthesia type: general  Patient location: PACU  Post pain: Pain level controlled  Post assessment: Patient's Cardiovascular Status Stable  Last Vitals:  Filed Vitals:   09/24/14 2345  BP: 98/56  Pulse: 66  Temp:   Resp: 15    Post vital signs: Reviewed and stable  Level of consciousness: sedated  Complications: No apparent anesthesia complications

## 2014-09-24 NOTE — Interval H&P Note (Signed)
History and Physical Interval Note:  09/24/2014 1:36 PM  Julian [name]  has presented today for surgery, with the diagnosis of infected right forearm  The various methods of treatment have been discussed with the patient and family. After consideration of risks, benefits and other options for treatment, the patient has consented to  Procedure(s): REPEAT IRRIGATION AND DEBRIDEMENT RIGHT FOREARM WITH POSSIBLE DISTAL THIRD FOREARM AMPUTATION (Right) AMPUTATION RIGHT DISTAL THIRD FOREARM (Right) as a surgical intervention .  The patient's history has been reviewed, patient examined, no change in status, stable for surgery.  I have reviewed the patient's chart and labs.  Questions were answered to the patient's satisfaction.     Charlotte Crumb A

## 2014-09-24 NOTE — Progress Notes (Addendum)
ANTIBIOTIC CONSULT NOTE - FOLLOW UP  Pharmacy Consult for vancomycin + zosyn Indication: cellulitis  No Known Allergies  Patient Measurements: Height: 5\' 10"  (177.8 cm) Weight: 229 lb 4.5 oz (104 kg) IBW/kg (Calculated) : 73  Vital Signs: Temp: 98.7 F (37.1 C) (05/17 1145) Temp Source: Oral (05/17 1145) BP: 94/48 mmHg (05/17 1215) Pulse Rate: 71 (05/17 1215) Intake/Output from previous day: 05/16 0701 - 05/17 0700 In: 596.5 [P.O.:120; I.V.:426.5; IV Piggyback:50] Out: 2000  Intake/Output from this shift: Total I/O In: 112.1 [I.V.:112.1] Out: -   Labs:  Recent Labs  09/23/14 0754 09/24/14 0505 09/24/14 1000  WBC 13.4* 9.6 10.3  HGB 12.3* 11.2* 11.6*  PLT 104* 78* 81*  CREATININE 8.53* 6.02* 6.22*   Estimated Creatinine Clearance: 13.9 mL/min (by C-G formula based on Cr of 6.22). No results for input(s): VANCOTROUGH, VANCOPEAK, VANCORANDOM, GENTTROUGH, GENTPEAK, GENTRANDOM, TOBRATROUGH, TOBRAPEAK, TOBRARND, AMIKACINPEAK, AMIKACINTROU, AMIKACIN in the last 72 hours.   Microbiology: Recent Results (from the past 720 hour(s))  Urine culture     Status: None   Collection Time: 09/20/14  7:00 AM  Result Value Ref Range Status   Specimen Description URINE, CATHETERIZED  Final   Special Requests NONE  Final   Colony Count NO GROWTH Performed at [facility]   Final   Culture NO GROWTH Performed at [facility]   Final   Report Status 09/21/2014 FINAL  Final  Culture, blood (routine x 2)     Status: None (Preliminary result)   Collection Time: 09/20/14  7:48 AM  Result Value Ref Range Status   Specimen Description LEFT ANTECUBITAL  Final   Special Requests BOTTLES DRAWN AEROBIC ONLY 6CC  Final   Culture NO GROWTH 4 DAYS  Final   Report Status PENDING  Incomplete  Culture, blood (routine x 2)     Status: None (Preliminary result)   Collection Time: 09/20/14  8:30 AM  Result Value Ref Range Status   Specimen Description BLOOD LEFT HAND  Final    Special Requests BOTTLES DRAWN AEROBIC AND ANAEROBIC 6CC  Final   Culture NO GROWTH 4 DAYS  Final   Report Status PENDING  Incomplete  Anaerobic culture     Status: None (Preliminary result)   Collection Time: 09/20/14  2:46 PM  Result Value Ref Range Status   Specimen Description ABSCESS  Final   Special Requests RIGHT HAND PT ON VACOMYCIN ZOSYN  Final   Gram Stain   Final    FEW WBC PRESENT, PREDOMINANTLY MONONUCLEAR NO SQUAMOUS EPITHELIAL CELLS SEEN ABUNDANT GRAM POSITIVE COCCI IN PAIRS IN CLUSTERS ABUNDANT GRAM NEGATIVE RODS Performed at [facility]    Culture   Final    NO ANAEROBES ISOLATED; CULTURE IN PROGRESS FOR 5 DAYS Performed at [facility]    Report Status PENDING  Incomplete  Culture, routine-abscess     Status: None   Collection Time: 09/20/14  2:46 PM  Result Value Ref Range Status   Specimen Description ABSCESS  Final   Special Requests RIGHT HAND PT ON VANCOMYCIN ZOSYN  Final   Gram Stain   Final    FEW WBC PRESENT, PREDOMINANTLY MONONUCLEAR NO SQUAMOUS EPITHELIAL CELLS SEEN ABUNDANT GRAM POSITIVE COCCI IN PAIRS IN CLUSTERS ABUNDANT GRAM NEGATIVE RODS Performed at [facility]    Culture   Final    MODERATE GROUP B STREP(S.AGALACTIAE)ISOLATED Note: TESTING AGAINST S. AGALACTIAE NOT ROUTINELY PERFORMED DUE TO PREDICTABILITY OF AMP/PEN/VAN SUSCEPTIBILITY. Performed at [facility]  Report Status 09/23/2014 FINAL  Final  MRSA PCR Screening     Status: Abnormal   Collection Time: 09/20/14  6:45 PM  Result Value Ref Range Status   MRSA by PCR POSITIVE (A) NEGATIVE Final    Comment:        The GeneXpert MRSA Assay (FDA approved for NASAL specimens only), is one component of a comprehensive MRSA colonization surveillance program. It is not intended to diagnose MRSA infection nor to guide or monitor treatment for MRSA infections. RESULT CALLED TO, READ BACK BY AND VERIFIED WITH: T WOODS RN [id number] 09/20/14 A BROWNING    Anaerobic culture     Status: None (Preliminary result)   Collection Time: 09/22/14 10:57 AM  Result Value Ref Range Status   Specimen Description ABSCESS  Final   Special Requests RIGHT HAND PT ON VANCOMYCIN ZOSYN  Final   Gram Stain   Final    NO WBC SEEN NO SQUAMOUS EPITHELIAL CELLS SEEN FEW GRAM POSITIVE COCCI IN PAIRS Performed at [facility]    Culture   Final    NO ANAEROBES ISOLATED; CULTURE IN PROGRESS FOR 5 DAYS Performed at [facility]    Report Status PENDING  Incomplete  Culture, routine-abscess     Status: None (Preliminary result)   Collection Time: 09/22/14 10:57 AM  Result Value Ref Range Status   Specimen Description ABSCESS  Final   Special Requests RIGHT HAND PT ON VANCOMYCIN ZOSYN  Final   Gram Stain   Final    NO WBC SEEN NO SQUAMOUS EPITHELIAL CELLS SEEN FEW GRAM POSITIVE COCCI IN PAIRS Performed at [facility]    Culture   Final    MODERATE ENTEROCOCCUS SPECIES Performed at [facility]    Report Status PENDING  Incomplete    Anti-infectives    Start     Dose/Rate Route Frequency Ordered Stop   09/23/14 1200  vancomycin (VANCOCIN) IVPB 1000 mg/200 mL premix     1,000 mg 200 mL/hr over 60 Minutes Intravenous Every M-W-F (Hemodialysis) 09/21/14 0930     09/21/14 1000  vancomycin (VANCOCIN) IVPB 1000 mg/200 mL premix     1,000 mg 200 mL/hr over 60 Minutes Intravenous  Once 09/21/14 0925 09/21/14 1150   09/20/14 2200  piperacillin-tazobactam (ZOSYN) IVPB 2.25 g     2.25 g 100 mL/hr over 30 Minutes Intravenous 3 times per day 09/20/14 1844     09/20/14 1407  vancomycin (VANCOCIN) 1 GM/200ML IVPB    Comments:  Vira Agar, Beth   : cabinet override      09/20/14 1407 09/21/14 0214   09/20/14 0930  vancomycin (VANCOCIN) IVPB 1000 mg/200 mL premix     1,000 mg 200 mL/hr over 60 Minutes Intravenous  Once 09/20/14 0925 09/20/14 1115   09/20/14 0730  vancomycin (VANCOCIN) IVPB 1000 mg/200 mL premix     1,000 mg 200  mL/hr over 60 Minutes Intravenous  Once 09/20/14 0722 09/20/14 1009   09/20/14 0730  piperacillin-tazobactam (ZOSYN) IVPB 3.375 g     3.375 g 100 mL/hr over 30 Minutes Intravenous  Once 09/20/14 4287 09/20/14 0853      Assessment: 69 yo m admitted on 5/13 with non-healing right hand wound. Patient is s/p I&D on 5/13, 5/15, and 5/16. Pharmacy is consulted to dose vancomycin and zosyn for cellulitis of right hand. The right hand abscess is currently growing Enterococcus and group B strep. Patient is ESRD on HD MWF. Wbc 10.3, afebrile, ~CrCl ~14 ml/min.  5/13 BCx: ngtd 5/15 Right hand abscess: enterococcus, GBS  Vancomycin 5/13 >> Zosyn 5/13 >>  Goal of Therapy:  Pre-HD vancomycin level 15-25 mcg/mL  Plan: Continue vancomycin 1 gm IV qMWF-HD Will check a pre-HD vanc random tomorrow AM 5/18 Continue Zosyn 2.25 gm IV q8h Antibiotics can likely be de-escalated soon Monitor cbc, temperature curve, cx sensitivities, HD schedule and tolerance  Aailyah Dunbar L. Nicole Kindred, PharmD Clinical Pharmacy Resident Pager: [phone] 09/24/2014 1:53 PM

## 2014-09-24 NOTE — Progress Notes (Signed)
Rub persists, 3 component, echo with out effusion, ? Related to infx

## 2014-09-24 NOTE — Progress Notes (Signed)
Initial Nutrition Assessment  DOCUMENTATION CODES:  Obesity unspecified  INTERVENTION:  Other (Comment) (Diet advancement after surgery. RD to follow and add PO supplements as needed if intake is inadequate.)  NUTRITION DIAGNOSIS:  Increased nutrient needs related to wound healing as evidenced by estimated needs.   GOAL:  Patient will meet greater than or equal to 90% of their needs   MONITOR:  Diet advancement, PO intake, Labs, Weight trends  REASON FOR ASSESSMENT:  Low Braden    ASSESSMENT:  Patient admitted on 5/13 with a non-healing right hand wound and ongoing falls. Diagnosed with right hand gas gangrene.   S/P I&D of right hand on 5/13 and 5/15. For return to OR today per discussion with RN. Patient is currently NPO for surgery. Ate all of supper last night and ~60% of breakfast this AM. Patient reports that he was eating well PTA. Patient with increased protein needs to support healing.  Nutrition focused physical exam completed.  No muscle or subcutaneous fat depletion noticed.  Labs reviewed. Sodium low, phosphorus elevated.  

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
## 2014-09-24 NOTE — Transfer of Care (Signed)
Immediate Anesthesia Transfer of Care Note  Patient: Julian [name]  Procedure(s) Performed: Procedure(s): REPEAT IRRIGATION AND DEBRIDEMENT RIGHT FOREARM (Right) AMPUTATION RIGHT DISTAL THIRD FOREARM (Right)  Patient Location: PACU  Anesthesia Type:General  Level of Consciousness: awake, pateint uncooperative and lethargic  Airway & Oxygen Therapy: Patient Spontanous Breathing and Patient connected to face mask oxygen  Post-op Assessment: Report given to RN, Post -op Vital signs reviewed and stable, Patient moving all extremities and Patient moving all extremities X 4  Post vital signs: Reviewed and stable  Last Vitals:  Filed Vitals:   09/24/14 1930  BP: 118/58  Pulse: 88  Temp: 36.2 C  Resp: 25    Complications: No apparent anesthesia complications

## 2014-09-24 NOTE — Progress Notes (Signed)
Have spoken with Julian [name],s sister Julian [name] today regarding the severity of his right hand infection and the need for possible right distal forearm level amputation. She understands and has instructed me to do whatever is needed to get him to clear this infection

## 2014-09-24 NOTE — Progress Notes (Signed)
Subjective: Interval History: has no complaint.  Objective: Vital signs in last 24 hours: Temp:  [97.4 F (36.3 C)-98.7 F (37.1 C)] 98.7 F (37.1 C) (05/16 1959) Pulse Rate:  [62-106] 68 (05/17 0700) Resp:  [10-23] 10 (05/17 0700) BP: (51-150)/(27-119) 71/40 mmHg (05/17 0700) SpO2:  [68 %-100 %] 95 % (05/17 0700) Weight:  [104 kg (229 lb 4.5 oz)] 104 kg (229 lb 4.5 oz) (05/16 1202) Weight change:   Intake/Output from previous day: 05/16 0701 - 05/17 0700 In: 596.5 [P.O.:120; I.V.:426.5; IV Piggyback:50] Out: 2000  Intake/Output this shift:    General appearance: cooperative, moderately obese and slowed mentation Resp: diminished breath sounds bilaterally and rales bibasilar Cardio: S1, S2 normal and systolic murmur: holosystolic 2/6, blowing at apex GI: obese, pos bs, liver down 5 cm Extremities: edema 1+ and bilat LE AKA, dressing R hand, avf RUA pos B&T  Lab Results:  Recent Labs  09/23/14 0754 09/24/14 0505  WBC 13.4* 9.6  HGB 12.3* 11.2*  HCT 35.8* 33.3*  PLT 104* 78*   BMET:  Recent Labs  09/23/14 0754 09/24/14 0505  NA 132* 133*  K 5.1 4.3  CL 95* 95*  CO2 22 24  GLUCOSE 138* 191*  BUN 62* 35*  CREATININE 8.53* 6.02*  CALCIUM 7.7* 8.1*   No results for input(s): PTH in the last 72 hours. Iron Studies: No results for input(s): IRON, TIBC, TRANSFERRIN, FERRITIN in the last 72 hours.  Studies/Results: Dg Chest Port 1 View  09/23/2014   CLINICAL DATA:  69 year old male with a history of central line placement.  EXAM: PORTABLE CHEST - 1 VIEW  COMPARISON:  09/19/2014, 02/18/2014  FINDINGS: Cardiomediastinal silhouette unchanged in size and contour.  Right rotation of the patient.  Low lung volumes accentuates interstitial markings. No pneumothorax.  Interval placement of left IJ approach central venous catheter, which appears to terminate at the confluence of the superior vena cava and the brachiocephalic vein.  Left upper extremity vascular stent,  partially imaged.  IMPRESSION: Interval placement of a left IJ approach central venous catheter, appearing to terminate at the confluence of the brachiocephalic vein and the SVC.  No pneumothorax.  Bilateral atelectasis and/ consolidation.  Signed,  Dulcy Fanny. Earleen Newport, DO  [facility]  [facility]   Electronically Signed   By: Corrie Mckusick D.O.   On: 09/23/2014 14:33    I have reviewed the patient's current medications.  Assessment/Plan: 1 ESRD  2 kg above lowest wgt. bp still dependent on pressors. Will check CVP.  Solute ok.  2 DM controlled 3 PVD 4 Hand infx  On AB, debride sched 5 Anemia stable 6 HPTH meds P HD in am , CVPS,  AB, support bp    LOS: 4 days   Julian [name] L 09/24/2014,7:23 AM

## 2014-09-25 ENCOUNTER — Encounter (HOSPITAL_COMMUNITY): Payer: Self-pay | Admitting: Orthopedic Surgery

## 2014-09-25 DIAGNOSIS — A480 Gas gangrene: Secondary | ICD-10-CM

## 2014-09-25 LAB — CULTURE, ROUTINE-ABSCESS: Gram Stain: NONE SEEN

## 2014-09-25 LAB — CBC
HCT: 34 % — ABNORMAL LOW (ref 39.0–52.0)
Hemoglobin: 11.6 g/dL — ABNORMAL LOW (ref 13.0–17.0)
MCH: 30.2 pg (ref 26.0–34.0)
MCHC: 34.1 g/dL (ref 30.0–36.0)
MCV: 88.5 fL (ref 78.0–100.0)
Platelets: 82 10*3/uL — ABNORMAL LOW (ref 150–400)
RBC: 3.84 MIL/uL — ABNORMAL LOW (ref 4.22–5.81)
RDW: 17.7 % — ABNORMAL HIGH (ref 11.5–15.5)
WBC: 8.8 10*3/uL (ref 4.0–10.5)

## 2014-09-25 LAB — ANAEROBIC CULTURE

## 2014-09-25 LAB — GLUCOSE, CAPILLARY
GLUCOSE-CAPILLARY: 86 mg/dL (ref 65–99)
Glucose-Capillary: 94 mg/dL (ref 65–99)
Glucose-Capillary: 83 mg/dL (ref 65–99)
Glucose-Capillary: 86 mg/dL (ref 65–99)
Glucose-Capillary: 100 mg/dL — ABNORMAL HIGH (ref 65–99)

## 2014-09-25 LAB — VANCOMYCIN, RANDOM: VANCOMYCIN RM: 29 ug/mL

## 2014-09-25 LAB — CULTURE, BLOOD (ROUTINE X 2)
CULTURE: NO GROWTH
Culture: NO GROWTH

## 2014-09-25 MED ORDER — VANCOMYCIN HCL IN DEXTROSE 750-5 MG/150ML-% IV SOLN
750.0000 mg | Freq: Once | INTRAVENOUS | Status: DC
  Filled 2014-09-25: qty 150

## 2014-09-25 MED ORDER — FLUDROCORTISONE ACETATE 0.1 MG PO TABS
0.1000 mg | ORAL_TABLET | Freq: Two times a day (BID) | ORAL | Status: DC
  Administered 2014-09-25: 0.1 mg via ORAL
  Filled 2014-09-25 (×6): qty 1

## 2014-09-25 MED ORDER — DOPAMINE-DEXTROSE 3.2-5 MG/ML-% IV SOLN
INTRAVENOUS | Status: AC
  Filled 2014-09-25: qty 250

## 2014-09-25 MED ORDER — ATROPINE SULFATE 0.1 MG/ML IJ SOLN
INTRAMUSCULAR | Status: AC
  Filled 2014-09-25: qty 10

## 2014-09-25 MED ORDER — SODIUM CHLORIDE 0.9 % IV SOLN
2.0000 g | Freq: Two times a day (BID) | INTRAVENOUS | Status: AC
  Administered 2014-09-25 – 2014-09-29 (×10): 2 g via INTRAVENOUS
  Filled 2014-09-25 (×12): qty 2000

## 2014-09-25 MED ORDER — DOPAMINE-DEXTROSE 3.2-5 MG/ML-% IV SOLN
5.0000 ug/kg/min | INTRAVENOUS | Status: DC
  Administered 2014-09-25: 5 ug/kg/min via INTRAVENOUS

## 2014-09-25 MED ORDER — VANCOMYCIN HCL IN DEXTROSE 1-5 GM/200ML-% IV SOLN: 1000.0000 mg | INTRAVENOUS | Status: DC

## 2014-09-25 NOTE — Progress Notes (Addendum)
ANTIBIOTIC CONSULT NOTE - FOLLOW UP  Pharmacy Consult for Vancomycin Indication: cellulitis  No Known Allergies  Patient Measurements: Height: 5\' 10"  (177.8 cm) Weight: 229 lb 4.5 oz (104 kg) IBW/kg (Calculated) : 73  Vital Signs: Temp: 98.4 F (36.9 C) (05/18 0744) Temp Source: Oral (05/18 0744) BP: 60/46 mmHg (05/18 0645) Pulse Rate: 68 (05/18 0645) Intake/Output from previous day: 05/17 0701 - 05/18 0700 In: 806.8 [I.V.:606.8; IV Piggyback:200] Out: -  Intake/Output from this shift:    Labs:  Recent Labs  09/23/14 0754 09/24/14 0505 09/24/14 1000 09/25/14 0247  WBC 13.4* 9.6 10.3 8.8  HGB 12.3* 11.2* 11.6* 11.6*  PLT 104* 78* 81* 82*  CREATININE 8.53* 6.02* 6.22*  --    Estimated Creatinine Clearance: 13.9 mL/min (by C-G formula based on Cr of 6.22).  Recent Labs  09/25/14 0247  [facility] 29     Microbiology: Recent Results (from the past 720 hour(s))  Urine culture     Status: None   Collection Time: 09/20/14  7:00 AM  Result Value Ref Range Status   Specimen Description URINE, CATHETERIZED  Final   Special Requests NONE  Final   Colony Count NO GROWTH Performed at [facility]   Final   Culture NO GROWTH Performed at [facility]   Final   Report Status 09/21/2014 FINAL  Final  Culture, blood (routine x 2)     Status: None (Preliminary result)   Collection Time: 09/20/14  7:48 AM  Result Value Ref Range Status   Specimen Description LEFT ANTECUBITAL  Final   Special Requests BOTTLES DRAWN AEROBIC ONLY 6CC  Final   Culture NO GROWTH 4 DAYS  Final   Report Status PENDING  Incomplete  Culture, blood (routine x 2)     Status: None (Preliminary result)   Collection Time: 09/20/14  8:30 AM  Result Value Ref Range Status   Specimen Description BLOOD LEFT HAND  Final   Special Requests BOTTLES DRAWN AEROBIC AND ANAEROBIC 6CC  Final   Culture NO GROWTH 4 DAYS  Final   Report Status PENDING  Incomplete  Anaerobic culture      Status: None (Preliminary result)   Collection Time: 09/20/14  2:46 PM  Result Value Ref Range Status   Specimen Description ABSCESS  Final   Special Requests RIGHT HAND PT ON VACOMYCIN ZOSYN  Final   Gram Stain   Final    FEW WBC PRESENT, PREDOMINANTLY MONONUCLEAR NO SQUAMOUS EPITHELIAL CELLS SEEN ABUNDANT GRAM POSITIVE COCCI IN PAIRS IN CLUSTERS ABUNDANT GRAM NEGATIVE RODS Performed at [facility]    Culture   Final    NO ANAEROBES ISOLATED; CULTURE IN PROGRESS FOR 5 DAYS Performed at [facility]    Report Status PENDING  Incomplete  Culture, routine-abscess     Status: None   Collection Time: 09/20/14  2:46 PM  Result Value Ref Range Status   Specimen Description ABSCESS  Final   Special Requests RIGHT HAND PT ON VANCOMYCIN ZOSYN  Final   Gram Stain   Final    FEW WBC PRESENT, PREDOMINANTLY MONONUCLEAR NO SQUAMOUS EPITHELIAL CELLS SEEN ABUNDANT GRAM POSITIVE COCCI IN PAIRS IN CLUSTERS ABUNDANT GRAM NEGATIVE RODS Performed at [facility]    Culture   Final    MODERATE GROUP B STREP(S.AGALACTIAE)ISOLATED Note: TESTING AGAINST S. AGALACTIAE NOT ROUTINELY PERFORMED DUE TO PREDICTABILITY OF AMP/PEN/VAN SUSCEPTIBILITY. Performed at [facility]    Report Status 09/23/2014 FINAL  Final  MRSA PCR Screening  Status: Abnormal   Collection Time: 09/20/14  6:45 PM  Result Value Ref Range Status   MRSA by PCR POSITIVE (A) NEGATIVE Final    Comment:        The GeneXpert MRSA Assay (FDA approved for NASAL specimens only), is one component of a comprehensive MRSA colonization surveillance program. It is not intended to diagnose MRSA infection nor to guide or monitor treatment for MRSA infections. RESULT CALLED TO, READ BACK BY AND VERIFIED WITH: T WOODS RN [id number] 09/20/14 A BROWNING   Anaerobic culture     Status: None (Preliminary result)   Collection Time: 09/22/14 10:57 AM  Result Value Ref Range Status   Specimen Description ABSCESS   Final   Special Requests RIGHT HAND PT ON VANCOMYCIN ZOSYN  Final   Gram Stain   Final    NO WBC SEEN NO SQUAMOUS EPITHELIAL CELLS SEEN FEW GRAM POSITIVE COCCI IN PAIRS Performed at [facility]    Culture   Final    NO ANAEROBES ISOLATED; CULTURE IN PROGRESS FOR 5 DAYS Performed at [facility]    Report Status PENDING  Incomplete  Culture, routine-abscess     Status: None (Preliminary result)   Collection Time: 09/22/14 10:57 AM  Result Value Ref Range Status   Specimen Description ABSCESS  Final   Special Requests RIGHT HAND PT ON VANCOMYCIN ZOSYN  Final   Gram Stain   Final    NO WBC SEEN NO SQUAMOUS EPITHELIAL CELLS SEEN FEW GRAM POSITIVE COCCI IN PAIRS Performed at [facility]    Culture   Final    MODERATE ENTEROCOCCUS SPECIES Performed at [facility]    Report Status PENDING  Incomplete    Anti-infectives    Start     Dose/Rate Route Frequency Ordered Stop   09/27/14 1200  vancomycin (VANCOCIN) IVPB 1000 mg/200 mL premix     1,000 mg 200 mL/hr over 60 Minutes Intravenous Every M-W-F (Hemodialysis) 09/25/14 0754     09/25/14 1200  vancomycin (VANCOCIN) IVPB 750 mg/150 ml premix     750 mg 150 mL/hr over 60 Minutes Intravenous Once in dialysis 09/25/14 0754     09/23/14 1200  vancomycin (VANCOCIN) IVPB 1000 mg/200 mL premix  Status:  Discontinued     1,000 mg 200 mL/hr over 60 Minutes Intravenous Every M-W-F (Hemodialysis) 09/21/14 0930 09/25/14 0754   09/21/14 1000  vancomycin (VANCOCIN) IVPB 1000 mg/200 mL premix     1,000 mg 200 mL/hr over 60 Minutes Intravenous  Once 09/21/14 0925 09/21/14 1150   09/20/14 2200  piperacillin-tazobactam (ZOSYN) IVPB 2.25 g     2.25 g 100 mL/hr over 30 Minutes Intravenous 3 times per day 09/20/14 1844     09/20/14 1407  vancomycin (VANCOCIN) 1 GM/200ML IVPB    Comments:  Vira Agar, Beth   : cabinet override      09/20/14 1407 09/21/14 0214   09/20/14 0930  vancomycin (VANCOCIN) IVPB 1000  mg/200 mL premix     1,000 mg 200 mL/hr over 60 Minutes Intravenous  Once 09/20/14 0925 09/20/14 1115   09/20/14 0730  vancomycin (VANCOCIN) IVPB 1000 mg/200 mL premix     1,000 mg 200 mL/hr over 60 Minutes Intravenous  Once 09/20/14 0722 09/20/14 1009   09/20/14 0730  piperacillin-tazobactam (ZOSYN) IVPB 3.375 g     3.375 g 100 mL/hr over 30 Minutes Intravenous  Once 09/20/14 8527 09/20/14 0853      Assessment: 69 yo m admitted on 5/13 with  non-healing right hand wound.  Pharmacy has been consulted to dose vancomycin and zosyn.  Patient has ESRD on HD MWF. A random vancomycin level was checked this AM and was 29, which is higher than the desired pre-HD level of 15-25 mcg/mL. Will schedule a one time dose today of 750 mg then put him back on his 1,000 mg qMWF-HD schedule starting on Friday 5/20. Wbc 8.8, afebrile, CrCl ~ 14 ml/min, BP 60/46 on phenylephrine 55 mcg/min.  5/13 BCx: ngtd 5/13 right hand abscess: GBS, enterococcus sensitive to ampicillin and vancomycin  Vancomycin 5/13 >> Zosyn 5/13 >>  Goal of Therapy:  Pre-HD vancomycin trough 15-25 mcg/mL  Plan:  Vancomycin 750 mg qHD x 1 today Vancomycin 1,000 mg qMWF-HD restarting on Friday 5/20 Continue Zosyn 2.25 gm IV q8h De-escalate when cx finalize Monitor cbc, cultures, HD schedule, vanc troughs as needed  Cassie L. Nicole Kindred, PharmD Clinical Pharmacy Resident Pager: [phone] 09/25/2014 7:59 AM  Patient has been de-escalated to Ampicillin 2 gm IV q12h for his right hand wound.   Cassie L. Nicole Kindred, PharmD Clinical Pharmacy Resident Pager: [phone] 09/25/2014 11:08 AM

## 2014-09-25 NOTE — Progress Notes (Signed)
Subjective: Interval History: has no complaint wants up in bed.  Objective: Vital signs in last 24 hours: Temp:  [97.2 F (36.2 C)-98.9 F (37.2 C)] 98 F (36.7 C) (05/18 0412) Pulse Rate:  [65-120] 68 (05/18 0645) Resp:  [7-43] 18 (05/18 0645) BP: (60-148)/(26-80) 60/46 mmHg (05/18 0645) SpO2:  [84 %-100 %] 93 % (05/18 0645) Weight change:   Intake/Output from previous day: 05/17 0701 - 05/18 0700 In: 806.8 [I.V.:606.8; IV Piggyback:200] Out: -  Intake/Output this shift: Total I/O In: 512.6 [I.V.:412.6; IV Piggyback:100] Out: -   General appearance: alert, cooperative, no distress and moderately obese Resp: diminished breath sounds bilaterally Cardio: S1, S2 normal and systolic murmur: holosystolic 2/6, blowing at apex GI: obese, pos bs, liver down 5 cm Extremities: LE bilat aKAs, Rarm mid FA amp  Lab Results:  Recent Labs  09/24/14 1000 09/25/14 0247  WBC 10.3 8.8  HGB 11.6* 11.6*  HCT 34.4* 34.0*  PLT 81* 82*   BMET:  Recent Labs  09/24/14 0505 09/24/14 1000  NA 133* 132*  K 4.3 4.4  CL 95* 95*  CO2 24 24  GLUCOSE 191* 214*  BUN 35* 36*  CREATININE 6.02* 6.22*  CALCIUM 8.1* 8.3*   No results for input(s): PTH in the last 72 hours. Iron Studies: No results for input(s): IRON, TIBC, TRANSFERRIN, FERRITIN in the last 72 hours.  Studies/Results: Dg Chest Port 1 View  09/23/2014   CLINICAL DATA:  69 year old male with a history of central line placement.  EXAM: PORTABLE CHEST - 1 VIEW  COMPARISON:  09/19/2014, 02/18/2014  FINDINGS: Cardiomediastinal silhouette unchanged in size and contour.  Right rotation of the patient.  Low lung volumes accentuates interstitial markings. No pneumothorax.  Interval placement of left IJ approach central venous catheter, which appears to terminate at the confluence of the superior vena cava and the brachiocephalic vein.  Left upper extremity vascular stent, partially imaged.  IMPRESSION: Interval placement of a left IJ  approach central venous catheter, appearing to terminate at the confluence of the brachiocephalic vein and the SVC.  No pneumothorax.  Bilateral atelectasis and/ consolidation.  Signed,  Dulcy Fanny. Earleen Newport, DO  [facility]  [facility]   Electronically Signed   By: Corrie Mckusick D.O.   On: 09/23/2014 14:33    I have reviewed the patient's current medications.  Assessment/Plan: 1 ESRD for HD. Has some periph edema and ^ CVP but bp will limit goals.  May need to tol lower bp 2 Low bp on pressors and has low HR , off DA now.  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

## 2014-09-25 NOTE — Progress Notes (Signed)
eLink Physician-Brief Progress Note Patient Name: Julian [name] DOB: 03-18-1946 MRN: [id number]   Date of Service  09/25/2014  HPI/Events of Note  Platelet count = 82K. Patient currently on  Heparin [place]  eICU Interventions  Will send HIT Antibody panel.     Intervention Category Intermediate Interventions: Other:  Lysle Dingwall 09/25/2014, 3:36 PM

## 2014-09-25 NOTE — Progress Notes (Signed)
PULMONARY / CRITICAL CARE MEDICINE   Name: Julian [name] MRN: [id number] DOB: [birth year]    ADMISSION DATE:  09/20/2014 CONSULTATION DATE:  09/20/14  REFERRING MD :  Dr Wyline Copas  CHIEF COMPLAINT:  Septic Shock  INITIAL PRESENTATION:  60 w ESRD on HD, severe PVD, DM. Transferred to [facility] for surgical debridement of R hand wound. He was hypotensive post-op and transferred to ICU on dopamine. PCCM to assume care until stabilized.   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, Bradycardiac overnight 5/18, dopamine added  Subjective: remains on neosynephrine, amputation yesterday of R hand, on dopamine for bradycardia episode  VITAL SIGNS: Temp:  [97.2 F (36.2 C)-98.9 F (37.2 C)] 97.9 F (36.6 C) (05/18 0940) Pulse Rate:  [65-120] 81 (05/18 1030) Resp:  [7-43] 15 (05/18 1030) BP: (60-148)/(26-80) 83/62 mmHg (05/18 1030) SpO2:  [84 %-100 %] 100 % (05/18 1030) Weight:  [108.3 kg (238 lb 12.1 oz)] 108.3 kg (238 lb 12.1 oz) (05/18 0940)  Room air  HEMODYNAMICS: CVP:  [14 mmHg-18 mmHg] 15 mmHg VENTILATOR SETTINGS:   INTAKE / OUTPUT:  Intake/Output Summary (Last 24 hours) at 09/25/14 1042 Last data filed at 09/25/14 0940  Gross per 24 hour  Intake 1015.71 ml  Output      0 ml  Net 1015.71 ml    PHYSICAL EXAMINATION: General:  Awake, alert, good spirits today HENT: NCAT EOMi PULM: CTA B CV: RRR distant heart sounds GI: BS+, soft, nontender MSK: s/p bilat AKA, R forearm dressing in place Neuro Awake, conversant today, oriented to situation  LABS:  CBC  Recent Labs Lab 09/24/14 0505 09/24/14 1000 09/25/14 0247  WBC 9.6  10.3 8.8  HGB 11.2* 11.6* 11.6*  HCT 33.3* 34.4* 34.0*  PLT 78* 81* 82*   Coag's No results for input(s): APTT, INR in the last 168 hours. BMET  Recent Labs Lab 09/23/14 0754 09/24/14 0505 09/24/14 1000  NA 132* 133* 132*  K 5.1 4.3 4.4  CL 95* 95* 95*  CO2 22 24 24   BUN 62* 35* 36*  CREATININE 8.53* 6.02* 6.22*  GLUCOSE 138* 191* 214*   Electrolytes  Recent Labs Lab 09/21/14 0426 09/22/14 1316 09/23/14 0754 09/24/14 0505 09/24/14 1000  CALCIUM 8.8* 8.2* 7.7* 8.1* 8.3*  MG 2.0  --   --   --   --   PHOS 5.9* 6.5*  --  5.0* 5.1*   Sepsis Markers  Recent Labs Lab 09/22/14 1816 09/23/14 0754 09/24/14 1251  LATICACIDVEN 3.1* 2.1* 2.6*   ABG No results for input(s): PHART, PCO2ART, PO2ART in the last 168 hours. Liver Enzymes  Recent Labs Lab 09/20/14 0636  09/21/14 0426  09/23/14 0754 09/24/14 0505 09/24/14 1000  AST 76*  --  79*  --  40  --   --   ALT 17  --  16*  --  15*  --   --   ALKPHOS 146*  --  136*  --  114  --   --   BILITOT 3.3*  --  2.8*  --  3.1*  --   --   ALBUMIN 3.0*  < >  2.4*  < > 2.0* 2.3* 2.3*  < > = values in this interval not displayed. Cardiac Enzymes No results for input(s): TROPONINI, PROBNP in the last 168 hours. Glucose  Recent Labs Lab 09/24/14 0840 09/24/14 1202 09/24/14 1643 09/24/14 1941 09/24/14 2106 09/25/14 0743  GLUCAP 154* 177* 178* 142* 142* 94    Imaging 5/13 hand films personally reviewed > four digit hand, gas present Op note from 5/15 reviewed> purulent drainage debrided   ASSESSMENT / PLAN:  PULMONARY OETT 5/13 >> 5/13 (surgery only) A:  No acute issues P:   Incentive spirometry Monitor O2 saturation  CARDIOVASCULAR CVL A:  Shock, septic > persistent, lactic acid still mildly elevated; BP decreased at  Baseline, has been on midodrine since early 2016 Bradycardia event overnight 5/18 > added dopamine Severe PVD CAD HTN Ischemic CM > 5/16 echo similar to prior study P:  Continue neo  SBP > 80, will tolerate as low as we can with normal mental status May need to switch completely to dopamine given systolic heart failure Not a good milrinone/long term inotrope candidate Tele F/U echo Continue midodrine Add florinef Consider florinef  RENAL A:  ESRD on HD P:   Renal service following, coordinating HD  GASTROINTESTINAL A:  Chronic diarrhea P:   Continue home doses of bentyl and lomotil  HEMATOLOGIC A:  Anemia of chronic disease P:  Follow CBC  INFECTIOUS A:  Sepsis cause by R hand gas gangrene > appears to be improving P:   BCx2 5/13 >> neg UC 5/13 >> neg R hand wound 5/13 >> w/ gpc clusters and chains>>> group b strep Hand wound 5/15 > amp susceptible enterococcus  Vanco 5/13 >> 5/18 Zosyn 5/13 >> 5/18   Plan ampicillin per pharmacy, plan 14 day course total (including time on vanc/zosyn)  S/p debridement 5/13, plan narrow abx based on cx data; repeat debridement 5/16   ENDOCRINE A:  DM   P:   SSI protocol  NEUROLOGIC A:  No acute issues P:   Pain control, judicious w narcs   FAMILY  - Updates: Sister [name] updated bedside, both she and her brother seem to be agreeable to a SNF.  Obviously we will need to work to get him off of pressors first.  - Inter-disciplinary family meet or Palliative Care meeting due by:  5/20    My cc time 35 minutes  Roselie Awkward, MD [place] [facility] Pager: [phone] Cell: [phone] If no response, call [phone]

## 2014-09-25 NOTE — Op Note (Signed)
NAMELLEYTON, BYERS NO.:  [id number]  MEDICAL RECORD NO.:  [id number]  LOCATION:  2M03C                        FACILITY:  [facility]  PHYSICIAN:  Sheral Apley. Verlie Liotta, M.D.DATE OF BIRTH:  November 14, 1945  DATE OF PROCEDURE:  09/24/2014 DATE OF DISCHARGE:                              OPERATIVE REPORT   PREOPERATIVE DIAGNOSES:  Severe infection right hand involving thumb, index, long, and ring fingers; flexor sheath dorsally and volarly; gas gangrene, necrotic skin edges.  POSTOPERATIVE DIAGNOSES:  Severe infection right hand involving thumb, index, long, and ring fingers; flexor sheath dorsally and volarly; gas gangrene, necrotic skin edges.  PROCEDURE:  Incision and drainage above with distal third forearm level amputation.  SURGEON:  Sheral Apley. Burney Gauze, M.D.  ASSISTANT:  None.  ANESTHESIA:  General.  COMPLICATION:  No complication.  DRAINS:  No drains.  SPECIMEN:  One specimen sent.  DESCRIPTION OF PROCEDURE:  The patient was taken to the operating suite. After induction of adequate general anesthetic, right upper extremity was prepped and draped in sterile fashion.  An Esmarch was used to exsanguinate the limb and a proximal forearm tourniquet was placed. Tourniquet was inflated to 250 mmHg.  At this point in time, we inspected the hand from his previous incision and drainage.  He had a previous small finger ray amputation that had healed, but then broke down, spread to the rest of the hand, presented with gas gangrene. Inspection prior to beginning OR procedure revealed significant devitalized tissue of the long and ring fingers with exposed tendon, neurovascular bundles, necrotic skin edges both dorsally and volarly, as well as extending into the mid palmar area.  The thumb index had large areas of necrosis dorsally and there was significant skin loss dorsally as well with necrotic edges along the skin incision that had been placed dorsally.  All  packing was removed.  We then went ahead and decided to perform a distal third forearm level amputation with a large volar flap of skin.  We carefully marked out a volar skin incision paralleling the distal wrist crease and extending along the dorsal aspect along the previous incision, then transversing along the ulnar styloid and median regional incision.  We sharply incised the skin dorsally.  We went across the extensor tendons palmarly.  We identified the radial artery, the median nerve, and the ulnar artery and nerve.  We used hemoclips on the ulnar artery and the radial artery, which pulled on the ulnar and radial nerves, median nerves and rather than retract in the forearm. Then we dissected to the wrist capsule volarly going to the carpal canal, extend this dorsally and performed a wrist disarticulation.  Once we had done this, we carefully did a subperiosteal dissection of the radius and ulna and transected with a sagittal saw, 3 cm from the joint. Once we had done this, we realized we were still deficient and enough skin to cover and dissected back a little bit more proximal on the radius and ulna another cm too, transected with a sagittal saw.  We then thoroughly irrigated.  Any devitalized tissue was removed.  All the tendons both the flexor and extensor were carefully and sharply resected with a 15 blade.  We retraced the ulnar artery and ulnar nerve and we hemoclipped them more proximal and did the same thing with the radial, median, ulnar nerves, which were retracted proximally.  We then closed in layers of 2-0 undyed Vicryl and staples on the skin.  We then dressed with Xeroform, 4x4's, Coban wrap, compression bandage, and dorsal and volar splints.  The patient tolerated the procedure well and went to the recovery room in stable fashion.     Sheral Apley Burney Gauze, M.D.     MAW/MEDQ  D:  09/24/2014  T:  09/25/2014  Job:  [id number]

## 2014-09-25 NOTE — Progress Notes (Signed)
PT Cancellation Note  Patient Details Name: Julian [name] MRN: [id number] DOB: [birth year]   Cancelled Treatment:    Reason Eval/Treat Not Completed: Patient at procedure or test/unavailable. Pt currently undergoing HD in room. Will check back as schedule allows to complete PT eval.    Rolinda Roan 09/25/2014, 11:14 AM   Rolinda Roan, PT, DPT Acute Rehabilitation Services Pager: [phone]

## 2014-09-25 NOTE — Progress Notes (Signed)
eLink Physician-Brief Progress Note Patient Name: Julian [name] DOB: [birth year] MRN: [id number]   Date of Service  09/25/2014  HPI/Events of Note  Loletha Grayer episodes more frequent per RN  eICU Interventions  Add dopamine gtt Atropine at bedside Chk K - HD planned in am     Intervention Category Major Interventions: Arrhythmia - evaluation and management  Manila Rommel V. 09/25/2014, 2:30 AM

## 2014-09-25 NOTE — Progress Notes (Signed)
Patient much more comfortable this am  Dressing dry and intact  Will need dressing change in 5-7 days

## 2014-09-26 ENCOUNTER — Ambulatory Visit (HOSPITAL_COMMUNITY): Payer: Medicare Other | Admitting: Physical Therapy

## 2014-09-26 LAB — COMPREHENSIVE METABOLIC PANEL
ALT: 16 U/L — AB (ref 17–63)
ANION GAP: 17 — AB (ref 5–15)
AST: 49 U/L — ABNORMAL HIGH (ref 15–41)
Albumin: 2.4 g/dL — ABNORMAL LOW (ref 3.5–5.0)
Alkaline Phosphatase: 136 U/L — ABNORMAL HIGH (ref 38–126)
BUN: 24 mg/dL — ABNORMAL HIGH (ref 6–20)
CALCIUM: 9 mg/dL (ref 8.9–10.3)
CO2: 22 mmol/L (ref 22–32)
Chloride: 95 mmol/L — ABNORMAL LOW (ref 101–111)
Creatinine, Ser: 4.94 mg/dL — ABNORMAL HIGH (ref 0.61–1.24)
GFR calc non Af Amer: 11 mL/min — ABNORMAL LOW (ref >60)
GFR, EST AFRICAN AMERICAN: 13 mL/min — AB (ref >60)
Glucose, Bld: 95 mg/dL (ref 65–99)
Potassium: 4.2 mmol/L (ref 3.5–5.1)
Sodium: 134 mmol/L — ABNORMAL LOW (ref 135–145)
TOTAL PROTEIN: 6.7 g/dL (ref 6.5–8.1)
Total Bilirubin: 3.7 mg/dL — ABNORMAL HIGH (ref 0.3–1.2)

## 2014-09-26 LAB — CBC WITH DIFFERENTIAL/PLATELET
BASOS PCT: 0 % (ref 0–1)
Basophils Absolute: 0 10*3/uL (ref 0.0–0.1)
EOS ABS: 0.1 10*3/uL (ref 0.0–0.7)
EOS PCT: 1 % (ref 0–5)
HCT: 35.8 % — ABNORMAL LOW (ref 39.0–52.0)
HEMOGLOBIN: 11.9 g/dL — AB (ref 13.0–17.0)
Lymphocytes Relative: 18 % (ref 12–46)
Lymphs Abs: 1.6 10*3/uL (ref 0.7–4.0)
MCH: 29.6 pg (ref 26.0–34.0)
MCHC: 33.2 g/dL (ref 30.0–36.0)
MCV: 89.1 fL (ref 78.0–100.0)
MONO ABS: 0.8 10*3/uL (ref 0.1–1.0)
Monocytes Relative: 10 % (ref 3–12)
NEUTROS ABS: 6.1 10*3/uL (ref 1.7–7.7)
NEUTROS PCT: 71 % (ref 43–77)
Platelets: 79 10*3/uL — ABNORMAL LOW (ref 150–400)
RBC: 4.02 MIL/uL — ABNORMAL LOW (ref 4.22–5.81)
RDW: 18 % — ABNORMAL HIGH (ref 11.5–15.5)
WBC: 8.6 10*3/uL (ref 4.0–10.5)

## 2014-09-26 LAB — GLUCOSE, CAPILLARY
GLUCOSE-CAPILLARY: 91 mg/dL (ref 65–99)
GLUCOSE-CAPILLARY: 95 mg/dL (ref 65–99)
Glucose-Capillary: 113 mg/dL — ABNORMAL HIGH (ref 65–99)
Glucose-Capillary: 60 mg/dL — ABNORMAL LOW (ref 65–99)
Glucose-Capillary: 74 mg/dL (ref 65–99)

## 2014-09-26 LAB — HEPARIN INDUCED THROMBOCYTOPENIA PNL: HEPARIN INDUCED PLT AB: 0.156 {OD_unit} (ref 0.000–0.400)

## 2014-09-26 LAB — PHOSPHORUS: PHOSPHORUS: 5.5 mg/dL — AB (ref 2.5–4.6)

## 2014-09-26 MED ORDER — SODIUM CHLORIDE 0.9 % IV SOLN
INTRAVENOUS | Status: DC | PRN
  Administered 2014-09-26: 08:00:00 via INTRAVENOUS

## 2014-09-26 MED ORDER — WHITE PETROLATUM GEL
Status: AC
  Administered 2014-09-26: 20:00:00
  Filled 2014-09-26: qty 1

## 2014-09-26 MED ORDER — CHLORHEXIDINE GLUCONATE CLOTH 2 % EX PADS: 6.0000 | MEDICATED_PAD | Freq: Every day | CUTANEOUS | Status: DC

## 2014-09-26 MED ORDER — LIDOCAINE-PRILOCAINE 2.5-2.5 % EX CREA: 1.0000 "application " | TOPICAL_CREAM | CUTANEOUS | Status: DC | PRN

## 2014-09-26 MED ORDER — MUPIROCIN 2 % EX OINT: 1.0000 "application " | TOPICAL_OINTMENT | Freq: Two times a day (BID) | CUTANEOUS | Status: DC

## 2014-09-26 MED ORDER — HEPARIN SODIUM (PORCINE) 1000 UNIT/ML DIALYSIS
40.0000 [IU]/kg | Freq: Once | INTRAMUSCULAR | Status: DC
  Filled 2014-09-26: qty 5

## 2014-09-26 MED ORDER — LIDOCAINE HCL (PF) 1 % IJ SOLN: 5.0000 mL | INTRAMUSCULAR | Status: DC | PRN

## 2014-09-26 MED ORDER — PENTAFLUOROPROP-TETRAFLUOROETH EX AERO: 1.0000 "application " | INHALATION_SPRAY | CUTANEOUS | Status: DC | PRN

## 2014-09-26 MED ORDER — HEPARIN SODIUM (PORCINE) 1000 UNIT/ML DIALYSIS: 1000.0000 [IU] | INTRAMUSCULAR | Status: DC | PRN

## 2014-09-26 MED ORDER — SODIUM CHLORIDE 0.9 % IV SOLN: 100.0000 mL | INTRAVENOUS | Status: DC | PRN

## 2014-09-26 MED ORDER — ALTEPLASE 2 MG IJ SOLR
2.0000 mg | Freq: Once | INTRAMUSCULAR | Status: AC | PRN
  Filled 2014-09-26: qty 2

## 2014-09-26 MED ORDER — NEPRO/CARBSTEADY PO LIQD: 237.0000 mL | ORAL | Status: DC | PRN

## 2014-09-26 NOTE — Clinical Social Work Note (Signed)
Clinical Social Work Assessment  Patient Details  Name: Julian [name] MRN: [id number] Date of Birth: [birth year]  Date of referral:  09/26/14               Reason for consult:  Facility Placement                Permission sought to share information with:  Case Manager, Customer service manager, Family Supports Permission granted to share information::  Yes, Verbal Permission Granted  Name::      Dealer)  Agency::   (YES)  Relationship::   (Sister)  Contact Information:   [phone])  Housing/Transportation Living arrangements for the past 2 months:  Single Family Home Source of Information:  Patient Patient Interpreter Needed:  None Criminal Activity/Legal Involvement Pertinent to Current Situation/Hospitalization:  No - Comment as needed Significant Relationships:  Siblings Lives with:  Self Do you feel safe going back to the place where you live?  Yes Need for family participation in patient care:  Yes (Comment)  Care giving concerns:  No care giving concerns identified at this time.    Social Worker assessment / plan:  Holiday representative had lengthy discussion with patient in reference to post-acute placement for SNF. CSW introduced CSW role and SNF process. Pt reported he has been at [facility] about 3 months ago when he had an infection in his left leg however he stated he will NOT return. Pt shared his negative experience at [facility] and further reported if he has to go to SNF he will NOT return to [facility]. Pt further expressed he is currently not interested in SNF placement however agreeable to new SNF search. Pt further stated he is service connected with [facility] and has 2 CNA's that are with him throughout the day. Pt expressed positive experiences with [facility] program and CNA's and stated he would like to return home with [facility] services. Pt reported he would need to discuss SNF placement with his sister before making a discuss and gave CSW permission to  contact sister, Julian [name] as needed. No further concerns reported at this time. CSW will continue to follow pt and pt's family for continued support and to facilitate discharge needs.   Employment status:  Disabled (Comment on whether or not currently receiving Disability) Insurance information:  Medicare PT Recommendations:  [place] / Referral to community resources:  [place]  Patient/Family's Response to care:  Pt lying in bed alert and oriented X4. Pt pleasant and undecided on SNF placement. Pt upset when speaking about care at [facility]. Pt pleasant and appreciated social work intervention.  Patient/Family's Understanding of and Emotional Response to Diagnosis, Current Treatment, and Prognosis: Pt expressed understanding of bilateral amputation and current treatment. Pt undecided in regards to discharge plans.  Emotional Assessment Appearance:  Appears older than stated age Attitude/Demeanor/Rapport:  Apprehensive Affect (typically observed):  Apprehensive, Calm, Hopeful, Pleasant Orientation:  Oriented to Self, Oriented to Place, Oriented to  Time, Oriented to Situation Alcohol / Substance use:  Never Used Psych involvement (Current and /or in the community):  No (Comment)  Discharge Needs  Concerns to be addressed:  No discharge needs identified Readmission within the last 30 days:  No Current discharge risk:  Physical Impairment Barriers to Discharge:  No Barriers Identified   Glendon Axe, MSW, LCSWA [phone] 09/26/2014

## 2014-09-26 NOTE — Progress Notes (Signed)
PULMONARY / CRITICAL CARE MEDICINE   Name: Julian [name] MRN: [id number] DOB: December 22, 1945    ADMISSION DATE:  09/20/2014 CONSULTATION DATE:  09/20/14  REFERRING MD :  Dr Wyline Copas  CHIEF COMPLAINT:  Septic Shock  INITIAL PRESENTATION:  53 w ESRD on HD, severe PVD, DM. Transferred to [facility] for surgical debridement of R hand wound. He was hypotensive post-op and transferred to ICU on dopamine. PCCM to assume care until stabilized.   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   VITAL SIGNS: Temp:  [97.3 F (36.3 C)-99.4 F (37.4 C)] 97.3 F (36.3 C) (05/19 1146) Pulse Rate:  [39-112] 83 (05/19 1015) Resp:  [0-25] 12 (05/19 1015) BP: (48-144)/(13-121) 92/27 mmHg (05/19 1015) SpO2:  [85 %-100 %] 90 % (05/19 1015) Weight:  [236 lb 15.9 oz (107.5 kg)] 236 lb 15.9 oz (107.5 kg) (05/18 1341)  Room air  HEMODYNAMICS: CVP:  [11 mmHg-15 mmHg] 15 mmHg VENTILATOR SETTINGS:   INTAKE / OUTPUT:  Intake/Output Summary (Last 24 hours) at 09/26/14 1335 Last data filed at 09/26/14 1000  Gross per 24 hour  Intake  556.6 ml  Output   1000 ml  Net -443.4 ml    PHYSICAL EXAMINATION: General:  Awake, alert, NAD  HENT: NCAT EOMi PULM: resps even non labored on RA, CTA B CV: RRR distant heart sounds GI: BS+, soft, nontender MSK: bilat AKA, R forearm dressing in place, c/d Neuro: Awake, MAE, difficult at times, oriented x3  LABS:  CBC  Recent Labs Lab 09/24/14 1000 09/25/14 0247 09/26/14 0532  WBC 10.3 8.8 8.6  HGB 11.6*  11.6* 11.9*  HCT 34.4* 34.0* 35.8*  PLT 81* 82* 79*   Coag's No results for input(s): APTT, INR in the last 168 hours. BMET  Recent Labs Lab 09/24/14 0505 09/24/14 1000 09/26/14 0532  NA 133* 132* 134*  K 4.3 4.4 4.2  CL 95* 95* 95*  CO2 24 24 22   BUN 35* 36* 24*  CREATININE 6.02* 6.22* 4.94*  GLUCOSE 191* 214* 95   Electrolytes  Recent Labs Lab 09/21/14 0426  09/24/14 0505 09/24/14 1000 09/26/14 0532  CALCIUM 8.8*  < > 8.1* 8.3* 9.0  MG 2.0  --   --   --   --   PHOS 5.9*  < > 5.0* 5.1* 5.5*  < > = values in this interval not displayed. Sepsis Markers  Recent Labs Lab 09/22/14 1816 09/23/14 0754 09/24/14 1251  LATICACIDVEN 3.1* 2.1* 2.6*   ABG No results for input(s): PHART, PCO2ART, PO2ART in the last 168 hours. Liver Enzymes  Recent Labs Lab 09/21/14 0426  09/23/14 0754 09/24/14 0505 09/24/14 1000 09/26/14 0532  AST 79*  --  40  --   --  49*  ALT 16*  --  15*  --   --  16*  ALKPHOS 136*  --  114  --   --  136*  BILITOT 2.8*  --  3.1*  --   --  3.7*  ALBUMIN 2.4*  < > 2.0* 2.3* 2.3*  2.4*  < > = values in this interval not displayed. Cardiac Enzymes No results for input(s): TROPONINI, PROBNP in the last 168 hours. Glucose  Recent Labs Lab 09/25/14 1508 09/25/14 1713 09/25/14 2136 09/26/14 0829 09/26/14 0830 09/26/14 1145  GLUCAP 83 86 100* 25* 91 95    Imaging No new films 5/19   ASSESSMENT / PLAN:  PULMONARY OETT 5/13 >> 5/13 (surgery only) A:  No acute issues P:   Incentive spirometry Monitor O2 saturation  CARDIOVASCULAR CVL A:  Shock, septic > persistent, lactic acid still mildly elevated; BP decreased at  Baseline, has been on midodrine since early 2016 Persistent hypotension  Severe PVD CAD HTN Ischemic CM > 5/16 echo similar to prior study P:  Continue to aggressively wean neo as able to keep SBP > 80, will tolerate as low as we can with normal mental status Not a good milrinone/long term inotrope  candidate Tele Continue midodrine Continue florinef  RENAL A:  ESRD on HD P:   Renal service following, coordinating HD  GASTROINTESTINAL A:  Chronic diarrhea P:   Continue home doses of bentyl and lomotil  HEMATOLOGIC A:  Anemia of chronic disease P:  Follow CBC  INFECTIOUS A:  Sepsis cause by R hand gas gangrene > appears to be improving P:   BCx2 5/13 >> neg UC 5/13 >> neg R hand wound 5/13 >> w/ gpc clusters and chains>>> group b strep Hand wound 5/15 > amp susceptible enterococcus  Vanco 5/13 >> 5/18 Zosyn 5/13 >> 5/18   Plan ampicillin per pharmacy, plan 14 day course total (including time on vanc/zosyn)   ENDOCRINE A:  DM   P:   SSI protocol  NEUROLOGIC A:  No acute issues P:   Pain control, judicious w narcs   FAMILY  - Updates: No family available 5/19.  Plan for SNF at d/c.   - Inter-disciplinary family meet or Palliative Care meeting due by:  5/20   Nickolas Madrid, NP 09/26/2014  1:35 PM Pager: [phone] or [phone]

## 2014-09-26 NOTE — Progress Notes (Signed)
Pt refusing all 0800 and 1000 oral meds. Attempted to give pt crushed meds in applesauce and he refused despite education. Will continue to attempt throughout the day.

## 2014-09-26 NOTE — Progress Notes (Signed)
eLink Physician-Brief Progress Note Patient Name: Julian [name] DOB: 1945/12/27 MRN: [id number]   Date of Service  09/26/2014  HPI/Events of Note   Recent Labs Lab 09/23/14 0754 09/24/14 0505 09/24/14 1000 09/25/14 0247 09/26/14 0532  PLT 104* 78* 81* 82* 79*   HIT screen - negative  eICU Interventions  D/w pharm : ok to continue sq heparin     Intervention Category Intermediate Interventions: Diagnostic test evaluation;Other:  Mandee Pluta 09/26/2014, 8:02 PM

## 2014-09-26 NOTE — Progress Notes (Signed)
Occupational Therapy Treatment Patient Details Name: Julian [name] MRN: [id number] DOB: 02/28/1946 Today's Date: 09/26/2014    History of present illness 69 y.o. 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 Pt returned to ED on 5/13 with continued falls. During work up, hand noted to be actively draining and purulent. Pt s/p I&D of right hand.   OT comments  Pt tolerated EOB sitting x ~12 mins with min - mod A.  He is very fearful of falling.   BP supine 77/49; sitting 114/95 and 95/67; supine 95/67.    He will need use of lift equipment to transfer safely OOB.    Follow Up Recommendations  SNF;Supervision/Assistance - 24 hour    Equipment Recommendations  None recommended by OT    Recommendations for Other Services      Precautions / Restrictions Precautions Precautions: Fall Restrictions Weight Bearing Restrictions: Yes RUE Weight Bearing: Weight bear through elbow only       Mobility Bed Mobility Overal bed mobility: Needs Assistance;+2 for physical assistance Bed Mobility: Rolling;Supine to Sit;Sit to Supine Rolling: Max assist;+2 for physical assistance   Supine to sit: Max assist;+2 for physical assistance Sit to supine: Max assist;+2 for physical assistance   General bed mobility comments: Pt requires assist with all aspects.  He is able to assist minimally using bedrail   Transfers                 General transfer comment: unable     Balance Overall balance assessment: Needs assistance Sitting-balance support: Feet unsupported;Single extremity supported Sitting balance-Leahy Scale: Poor Sitting balance - Comments: Requires mod A for sitting balance.  Pt is very fearful of falling                             ADL Overall ADL's : Needs assistance/impaired     Grooming: Wash/dry face;Moderate assistance;Sitting Grooming Details (indicate cue type and reason): requires mod A to maintain balance EOB                                       Vision                     Perception     Praxis      Cognition   Behavior During Therapy: Anxious Overall Cognitive Status: No family/caregiver present to determine baseline cognitive functioning                       Extremity/Trunk Assessment               Exercises     Shoulder Instructions       General Comments      Pertinent Vitals/ Pain       Pain Assessment: Faces Faces Pain Scale: Hurts even more Pain Location: buttocks   Home Living  Prior Functioning/Environment              Frequency Min 2X/week     Progress Toward Goals  OT Goals(current goals can now be found in the care plan section)  Progress towards OT goals: Progressing toward goals  ADL Goals Pt Will Perform Upper Body Bathing: with min assist;sitting Pt Will Perform Upper Body Dressing: with min assist;sitting Additional ADL Goal #1: Pt will perform bed mobility at Mod A  level as precursor for ADLs. Additional ADL Goal #2: Pt will be independent with edema management techniques for right upper extremity. Additional ADL Goal #3: Pt will perform HEP for bilateral upper extremities with supervision.  Plan Discharge plan remains appropriate    Co-evaluation                 End of Session     Activity Tolerance Patient limited by pain   Patient Left in bed;with call bell/phone within reach   Nurse Communication Mobility status        Time: 9935-7017 OT Time Calculation (min): 27 min  Charges: OT General Charges $OT Visit: 1 Procedure OT Treatments $Therapeutic Activity: 8-22 mins  Cabell Lazenby M 09/26/2014, 1:10 PM

## 2014-09-26 NOTE — Progress Notes (Signed)
Subjective: Interval History: has no complaint, sleepy this am.  Objective: Vital signs in last 24 hours: Temp:  [97.9 F (36.6 C)-99.4 F (37.4 C)] 98.6 F (37 C) (05/19 0436) Pulse Rate:  [39-112] 88 (05/19 0600) Resp:  [7-30] 7 (05/19 0600) BP: (48-196)/(13-169) 109/17 mmHg (05/19 0600) SpO2:  [85 %-100 %] 100 % (05/19 0600) Weight:  [107.5 kg (236 lb 15.9 oz)-108.3 kg (238 lb 12.1 oz)] 107.5 kg (236 lb 15.9 oz) (05/18 1341) Weight change:   Intake/Output from previous day: 05/18 0701 - 05/19 0700 In: 828.2 [P.O.:240; I.V.:488.2; IV Piggyback:100] Out: 1000  Intake/Output this shift: Total I/O In: 326.2 [I.V.:276.2; IV Piggyback:50] Out: 0   General appearance: cooperative, morbidly obese and slowed mentation Resp: diminished breath sounds bilaterally Cardio: S1, S2 normal and systolic murmur: holosystolic 2/6, blowing at apex GI: obese, pos bs, liver down 6 cm Extremities: bilat AKAs,  RUA avf b&t  Lab Results:  Recent Labs  09/25/14 0247 09/26/14 0532  WBC 8.8 8.6  HGB 11.6* 11.9*  HCT 34.0* 35.8*  PLT 82* PENDING   BMET:  Recent Labs  09/24/14 1000 09/26/14 0532  NA 132* 134*  K 4.4 4.2  CL 95* 95*  CO2 24 22  GLUCOSE 214* 95  BUN 36* 24*  CREATININE 6.22* 4.94*  CALCIUM 8.3* 9.0   No results for input(s): PTH in the last 72 hours. Iron Studies: No results for input(s): IRON, TIBC, TRANSFERRIN, FERRITIN in the last 72 hours.  Studies/Results: No results found.  I have reviewed the patient's current medications.  Assessment/Plan: 1  ESRD  HD MWF.  bp still up and down.  wgt rising 2 Anemia stable 3 low bp pressor dependent , may need slow wean,independent of bp 4 PVD AKAs 5 Hand amp with gangrene on AB, s/p debridement 6 Obesity 7DM controlled 8 CAD  P HD,slowly decrease pressores.  AB    LOS: 6 days   Julian [name] L 09/26/2014,6:52 AM

## 2014-09-26 NOTE — Evaluation (Signed)
Physical Therapy Evaluation Patient Details Name: Julian [name] MRN: [id number] DOB: 01-23-1946 Today's Date: 09/26/2014   History of Present Illness  69 y.o. 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 Pt returned to ED on 5/13 with continued falls. During work up, hand noted to be actively draining and purulent. Pt s/p I&D of right hand.  Clinical Impression  Pt admitted with above diagnosis. Pt currently with functional limitations due to the deficits listed below (see PT Problem List). At the time of PT eval pt was able to perform transfers with +2 max assist. Pt is very fearful of falling, even when surrounded by therapists to assist him with static sitting. Discussed the benefits of an amputee sling with RN who was agreeable - OT to inquire about getting one on the unit. Pt will benefit from skilled PT to increase their independence and safety with mobility to allow discharge to the venue listed below.       Follow Up Recommendations SNF;Supervision/Assistance - 24 hour    Equipment Recommendations  Other (comment) (Possibly a lift with amputee sling)    Recommendations for Other Services       Precautions / Restrictions Precautions Precautions: Fall Restrictions Weight Bearing Restrictions: Yes RUE Weight Bearing: Weight bear through elbow only      Mobility  Bed Mobility Overal bed mobility: Needs Assistance;+2 for physical assistance Bed Mobility: Rolling;Supine to Sit;Sit to Supine Rolling: Max assist;+2 for physical assistance   Supine to sit: Max assist;+2 for physical assistance Sit to supine: Max assist;+2 for physical assistance   General bed mobility comments: Pt requires assist with all aspects.  He is able to  assist minimally using bedrail. Hand-over-hand assist to reach for bed rail.   Transfers                 General transfer comment: unable   Ambulation/Gait                Stairs            Wheelchair Mobility    Modified Rankin (Stroke Patients Only)       Balance Overall balance assessment: Needs assistance Sitting-balance support: Feet unsupported;Single extremity supported Sitting balance-Leahy Scale: Poor Sitting balance - Comments: Requires mod A for sitting balance.  Pt is very fearful of falling                                      Pertinent Vitals/Pain Pain Assessment: Faces Faces Pain Scale: Hurts even more Pain Location: Buttocks Pain Intervention(s): Limited activity within patient's tolerance;Monitored during session;Repositioned    Home Living Family/patient expects to be discharged to:: Private residence Living Arrangements: Alone Available Help at Discharge: Personal care attendant Type of Home: Apartment Home Access: Level entry     Home Layout: One level Home Equipment: Wheelchair - power;Wheelchair - manual Additional Comments: per previous evaluation, pt has aide 7 days/week, 6-8 hours/day    Prior Function Level of Independence: Needs assistance   Gait / Transfers Assistance Needed: Pt with B AKA and was using a slide board for transfers  ADL's / [place] Needed: per previous evaluation,  aide assists with bathing, dressing, toileting, meals        Hand Dominance   Dominant Hand: Right    Extremity/Trunk Assessment   Upper Extremity Assessment: Defer to OT evaluation           Lower Extremity Assessment: Generalized weakness (B AKA's)      Cervical / Trunk Assessment: Normal;Other exceptions  Communication   Communication: Expressive difficulties (difficult to understand)  Cognition Arousal/Alertness: Awake/alert;Lethargic Behavior During Therapy: Anxious Overall Cognitive  Status: No family/caregiver present to determine baseline cognitive functioning                      General Comments      Exercises        Assessment/Plan    PT Assessment Patient needs continued PT services  PT Diagnosis Generalized weakness;Acute pain   PT Problem List Decreased strength;Decreased range of motion;Decreased activity tolerance;Decreased balance;Decreased mobility;Decreased knowledge of use of DME;Decreased safety awareness;Decreased knowledge of precautions;Pain  PT Treatment Interventions DME instruction;Functional mobility training;Therapeutic activities;Therapeutic exercise;Neuromuscular re-education;Patient/family education;Wheelchair mobility training   PT Goals (Current goals can be found in the Care Plan section) Acute Rehab PT Goals Patient Stated Goal: not stated PT Goal Formulation: With patient Time For Goal Achievement: 10/10/14 Potential to Achieve Goals: Fair    Frequency Min 2X/week   Barriers to discharge   Unsure if pt has 24 hour assistance    Co-evaluation PT/OT/SLP Co-Evaluation/Treatment: Yes Reason for Co-Treatment: Complexity of the patient's impairments (multi-system involvement);For patient/therapist safety PT goals addressed during session: Mobility/safety with mobility;Balance         End of Session   Activity Tolerance: Patient limited by fatigue;Patient limited by pain Patient left: in bed;with call bell/phone within reach Nurse Communication: Mobility status         Time: 6387-5643 PT Time Calculation (min) (ACUTE ONLY): 26 min   Charges:   PT Evaluation $Initial PT Evaluation Tier I: 1 Procedure     PT G Codes:        Rolinda Roan 10-12-14, 2:22 PM   Rolinda Roan, PT, DPT Acute Rehabilitation Services Pager: [phone]

## 2014-09-26 NOTE — Care Management Note (Signed)
Case Management Note  Patient Details  Name: Julian [name] MRN: [id number] Date of Birth: [birth year]  Subjective/Objective:                    Action/Plan:   Expected Discharge Date:                  Expected Discharge Plan:  Skilled Nursing Facility  In-House Referral:  Clinical Social Work  Discharge planning Services  CM Consult  Post Acute Care Choice:  Resumption of Svcs/PTA Provider Choice offered to:     DME Arranged:    DME Agency:     HH Arranged:  CAPS Program [place] Agency:     Status of Service:  In process, will continue to follow  Medicare Important Message Given:  Yes Date Medicare IM Given:  09/23/14 Medicare IM give by:  debbie dowell rn,bsn Date Additional Medicare IM Given:  09/26/14 Additional Medicare Important Message give by:  Luz Lex, RNBSN   If discussed at Long Length of Stay Meetings, dates discussed:    Additional Comments: 09-26-2014 Patient has now had R hand amputation.  Plan for SNF for ST rehab   capd case manager jula [phone] called. They are concerned that if has some of hand removed pt lives alone and only has caps and may need snf. sw ref made.  Vergie Living, RN 09/26/2014, 2:28 PM

## 2014-09-27 LAB — BASIC METABOLIC PANEL
Anion gap: 12 (ref 5–15)
BUN: 32 mg/dL — AB (ref 6–20)
CHLORIDE: 97 mmol/L — AB (ref 101–111)
CO2: 25 mmol/L (ref 22–32)
Calcium: 8.8 mg/dL — ABNORMAL LOW (ref 8.9–10.3)
Creatinine, Ser: 5.94 mg/dL — ABNORMAL HIGH (ref 0.61–1.24)
GFR calc non Af Amer: 9 mL/min — ABNORMAL LOW (ref >60)
GFR, EST AFRICAN AMERICAN: 10 mL/min — AB (ref >60)
Glucose, Bld: 108 mg/dL — ABNORMAL HIGH (ref 65–99)
POTASSIUM: 4.3 mmol/L (ref 3.5–5.1)
Sodium: 134 mmol/L — ABNORMAL LOW (ref 135–145)

## 2014-09-27 LAB — GLUCOSE, CAPILLARY
GLUCOSE-CAPILLARY: 109 mg/dL — AB (ref 65–99)
GLUCOSE-CAPILLARY: 84 mg/dL (ref 65–99)
Glucose-Capillary: 119 mg/dL — ABNORMAL HIGH (ref 65–99)
Glucose-Capillary: 108 mg/dL — ABNORMAL HIGH (ref 65–99)
Glucose-Capillary: 133 mg/dL — ABNORMAL HIGH (ref 65–99)

## 2014-09-27 LAB — ALBUMIN: ALBUMIN: 2 g/dL — AB (ref 3.5–5.0)

## 2014-09-27 LAB — CBC
HCT: 30.7 % — ABNORMAL LOW (ref 39.0–52.0)
HEMOGLOBIN: 10.4 g/dL — AB (ref 13.0–17.0)
MCH: 30.1 pg (ref 26.0–34.0)
MCHC: 33.9 g/dL (ref 30.0–36.0)
MCV: 88.7 fL (ref 78.0–100.0)
PLATELETS: 75 10*3/uL — AB (ref 150–400)
RBC: 3.46 MIL/uL — AB (ref 4.22–5.81)
RDW: 18 % — ABNORMAL HIGH (ref 11.5–15.5)
WBC: 7.2 10*3/uL (ref 4.0–10.5)

## 2014-09-27 LAB — ANAEROBIC CULTURE: GRAM STAIN: NONE SEEN

## 2014-09-27 LAB — PHOSPHORUS: Phosphorus: 6.3 mg/dL — ABNORMAL HIGH (ref 2.5–4.6)

## 2014-09-27 MED ORDER — SEVELAMER CARBONATE 800 MG PO TABS
2400.0000 mg | ORAL_TABLET | Freq: Two times a day (BID) | ORAL | Status: DC
  Administered 2014-09-28 – 2014-09-30 (×4): 2400 mg via ORAL
  Filled 2014-09-27 (×9): qty 3

## 2014-09-27 NOTE — Clinical Social Work Note (Signed)
Clinical Social Worker contacted pt's sister, [name] in reference to post-acute placement for SNF. Pt's sister stated: I love my brother but I do not want to go back down that road again with him. Pt's sister reported when pt was a resident at [facility] about 3 months ago pt cursed her out about being in facility. Pt's sister agreed that pt can benefit from rehab however she does not feel comfortable making that decision for him. Pt's sister requested [facility] however would like pt to make final decision about going to SNF.   CSW has completed FL-2 and faxed to SNF's. Pt transferring to 6N. CSW to provide handoff report to assigned CSW. This CSW signing off.   Glendon Axe, MSW, LCSWA [phone] 09/27/2014 10:38 AM

## 2014-09-27 NOTE — Clinical Social Work Placement (Signed)
   CLINICAL SOCIAL WORK PLACEMENT  NOTE  Date:  09/27/2014  Patient Details  Name: Julian [name] MRN: [id number] Date of Birth: 1945/11/01  Clinical Social Work is seeking post-discharge placement for this patient at the [place] level of care (*CSW will initial, date and re-position this form in  chart as items are completed):  Yes   Patient/family provided with [place] Work Department's list of facilities offering this level of care within the geographic area requested by the patient (or if unable, by the patient's family).  Yes   Patient/family informed of their freedom to choose among providers that offer the needed level of care, that participate in Medicare, Medicaid or managed care program needed by the patient, have an available bed and are willing to accept the patient.  Yes   Patient/family informed of [place]'s ownership interest in [facility] and [facility], as well as of the fact that they are under no obligation to receive care at these facilities.  PASRR submitted to EDS on       PASRR number received on       Existing PASRR number confirmed on 09/27/14     FL2 transmitted to all facilities in geographic area requested by pt/family on 09/27/14     FL2 transmitted to all facilities within larger geographic area on       Patient informed that his/her managed care company has contracts with or will negotiate with certain facilities, including the following:            Patient/family informed of bed offers received.  Patient chooses bed at       Physician recommends and patient chooses bed at      Patient to be transferred to   on  .  Patient to be transferred to facility by       Patient family notified on   of transfer.  Name of family member notified:        PHYSICIAN Please sign FL2     Additional Comment:    _______________________________________________ Rozell Searing, LCSW 09/27/2014, 11:27  AM

## 2014-09-27 NOTE — Progress Notes (Signed)
Pt right arm cast fell off, exposing staples and incision on stump. I called Dr. Bertis Ruddy office and spoke with a nurse. She instructed me to wrap the stump in gauze and kerlix until the physician can assess. Will pass these instructions on to 5W-03 nurse as patient is being transferred.

## 2014-09-27 NOTE — Progress Notes (Signed)
PULMONARY / CRITICAL CARE MEDICINE   Name: Julian [name] MRN: [id number] DOB: [birth year]    ADMISSION DATE:  09/20/2014 CONSULTATION DATE:  09/20/14  REFERRING MD :  Dr Wyline Copas  CHIEF COMPLAINT:  Septic Shock  INITIAL PRESENTATION:  40 w ESRD on HD, severe PVD, DM. Transferred to [facility] for surgical debridement of R hand wound. He was hypotensive post-op and transferred to ICU on dopamine. PCCM to assume care until stabilized.   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, Bradycardiac overnight 5/18, dopamine added Stopped neo 5/19, BP ran low on cuff measurements but no change in mental status  Subjective: Stopped neo 5/19, BP ran low on cuff measurements but no change in mental status  VITAL SIGNS: Temp:  [97.3 F (36.3 C)-98.7 F (37.1 C)] 97.9 F (36.6 C) (05/20 0308) Pulse Rate:  [52-104] 66 (05/20 0000) Resp:  [0-23] 8 (05/20 0500) BP: (52-139)/(11-105) 74/30 mmHg (05/20 0500) SpO2:  [82 %-100 %] 100 % (05/20 0000)  Room air  HEMODYNAMICS: CVP:  [14 mmHg-15 mmHg] 15 mmHg VENTILATOR SETTINGS:   INTAKE / OUTPUT:  Intake/Output Summary (Last 24 hours) at 09/27/14 0603 Last data filed at 09/27/14 0500  Gross per 24 hour  Intake  425.2 ml  Output      0 ml  Net  425.2 ml    PHYSICAL EXAMINATION: General:  Awake, alert, no distress HENT: NCAT EOMi PULM: CTA B, normal effort CV: RRR distant heart sounds GI: BS+, soft, nontender MSK: bilat AKA, R forearm dressing in place, c/d/i Neuro: Awake alert, intermittently oriented, somewhat angry  LABS:  CBC  Recent Labs Lab 09/25/14 0247 09/26/14 0532 09/27/14 0447   WBC 8.8 8.6 7.2  HGB 11.6* 11.9* 10.4*  HCT 34.0* 35.8* 30.7*  PLT 82* 79* 75*   Coag's No results for input(s): APTT, INR in the last 168 hours. BMET  Recent Labs Lab 09/24/14 1000 09/26/14 0532 09/27/14 0447  NA 132* 134* 134*  K 4.4 4.2 4.3  CL 95* 95* 97*  CO2 24 22 25   BUN 36* 24* 32*  CREATININE 6.22* 4.94* 5.94*  GLUCOSE 214* 95 108*   Electrolytes  Recent Labs Lab 09/21/14 0426  09/24/14 1000 09/26/14 0532 09/27/14 0447  CALCIUM 8.8*  < > 8.3* 9.0 8.8*  MG 2.0  --   --   --   --   PHOS 5.9*  < > 5.1* 5.5* 6.3*  < > = values in this interval not displayed. Sepsis Markers  Recent Labs Lab 09/22/14 1816 09/23/14 0754 09/24/14 1251  LATICACIDVEN 3.1* 2.1* 2.6*   ABG No results for input(s): PHART, PCO2ART, PO2ART in the last 168 hours. Liver Enzymes  Recent Labs Lab 09/21/14 0426  09/23/14 0754  09/24/14 1000 09/26/14 0532 09/27/14 0447  AST 79*  --  40  --   --  49*  --   ALT 16*  --  15*  --   --  16*  --   ALKPHOS 136*  --  114  --   --  136*  --   BILITOT 2.8*  --  3.1*  --   --  3.7*  --   ALBUMIN 2.4*  < > 2.0*  < > 2.3* 2.4* 2.0*  < > = values in this interval not displayed. Cardiac Enzymes No results for input(s): TROPONINI, PROBNP in the last 168 hours. Glucose  Recent Labs Lab 09/26/14 0830 09/26/14 1145 09/26/14 1712 09/26/14 1713 09/26/14 1728 09/26/14 2325  GLUCAP 91 95 74 60* 113* 109*    Imaging No new films 5/19   ASSESSMENT / PLAN:  PULMONARY OETT 5/13 >> 5/13 (surgery only) A:  No acute issues P:   Incentive spirometry Monitor O2 saturation  CARDIOVASCULAR CVL A:  Shock, septic > resolved Persistent hypotension  > cuff pressures are low but mental status at baseline Severe PVD CAD HTN Ischemic CM > 5/16 echo similar to prior study P:  Maintain off of pressors Tele Continue midodrine Continue florinef  RENAL A:  ESRD on HD P:   Renal service following, coordinating  HD  GASTROINTESTINAL A:  Chronic diarrhea P:   Continue home doses of bentyl and lomotil  HEMATOLOGIC A:  Anemia of chronic disease P:  Follow CBC  INFECTIOUS A:  Sepsis cause by R hand gas gangrene > appears to be improving P:   BCx2 5/13 >> neg UC 5/13 >> neg R hand wound 5/13 >> w/ gpc clusters and chains>>> group b strep Hand wound 5/15 > amp susceptible enterococcus  Vanco 5/13 >> 5/18 Zosyn 5/13 >> 5/18   Plan ampicillin per pharmacy, plan 14 day course total (including time on vanc/zosyn)   ENDOCRINE A:  DM   P:   SSI protocol  NEUROLOGIC A:  No acute issues P:   Pain control, judicious w narcs   FAMILY  - Updates: No family available 5/19.  Plan for SNF at d/c.   - Inter-disciplinary family meet or Palliative Care meeting due by:  5/20   Transfer to [facility] service, PCCM off  Roselie Awkward, MD [place] [facility] Pager: [phone] Cell: [phone] If no response, call [phone]

## 2014-09-27 NOTE — Progress Notes (Signed)
09/27/2014 10:36 AM  Report received. Taken by [employer] Nurse on [facility]. Room is ready for the patient. Lauren RN from [facility] stated she would bring patient once he finishes his Hemo treatment.   [employer], RN-BC, RN3 [facility] 6 [employer] [phone]

## 2014-09-27 NOTE — Progress Notes (Signed)
Attempted to call report to [place], nurse busy. Will call back in 15 minutes.

## 2014-09-27 NOTE — Progress Notes (Addendum)
ANTIBIOTIC CONSULT NOTE - FOLLOW UP  Pharmacy Consult for Ampicillin Indication: cellulitis  No Known Allergies  Patient Measurements: Height: 5\' 10"  (177.8 cm) Weight: 233 lb 11 oz (106 kg) IBW/kg (Calculated) : 73  Vital Signs: Temp: 97.8 F (36.6 C) (05/20 1124) Temp Source: Oral (05/20 1124) BP: 83/36 mmHg (05/20 1145) Pulse Rate: 71 (05/20 1145) Intake/Output from previous day: 05/19 0701 - 05/20 0700 In: 408.9 [I.V.:308.9; IV Piggyback:100] Out: -  Intake/Output from this shift: Total I/O In: 10 [I.V.:10] Out: 1000 [Other:1000]  Labs:  Recent Labs  09/25/14 0247 09/26/14 0532 09/27/14 0447  WBC 8.8 8.6 7.2  HGB 11.6* 11.9* 10.4*  PLT 82* 79* 75*  CREATININE  --  4.94* 5.94*   Estimated Creatinine Clearance: 14.7 mL/min (by C-G formula based on Cr of 5.94).  Recent Labs  09/25/14 0247  [facility] 29     Microbiology: Recent Results (from the past 720 hour(s))  Urine culture     Status: None   Collection Time: 09/20/14  7:00 AM  Result Value Ref Range Status   Specimen Description URINE, CATHETERIZED  Final   Special Requests NONE  Final   Colony Count NO GROWTH Performed at [facility]   Final   Culture NO GROWTH Performed at [facility]   Final   Report Status 09/21/2014 FINAL  Final  Culture, blood (routine x 2)     Status: None   Collection Time: 09/20/14  7:48 AM  Result Value Ref Range Status   Specimen Description LEFT ANTECUBITAL  Final   Special Requests BOTTLES DRAWN AEROBIC ONLY 6CC  Final   Culture NO GROWTH 5 DAYS  Final   Report Status 09/25/2014 FINAL  Final  Culture, blood (routine x 2)     Status: None   Collection Time: 09/20/14  8:30 AM  Result Value Ref Range Status   Specimen Description BLOOD LEFT HAND  Final   Special Requests BOTTLES DRAWN AEROBIC AND ANAEROBIC 6CC  Final   Culture NO GROWTH 5 DAYS  Final   Report Status 09/25/2014 FINAL  Final  Anaerobic culture     Status: None   Collection  Time: 09/20/14  2:46 PM  Result Value Ref Range Status   Specimen Description ABSCESS  Final   Special Requests RIGHT HAND PT ON VACOMYCIN ZOSYN  Final   Gram Stain   Final    FEW WBC PRESENT, PREDOMINANTLY MONONUCLEAR NO SQUAMOUS EPITHELIAL CELLS SEEN ABUNDANT GRAM POSITIVE COCCI IN PAIRS IN CLUSTERS ABUNDANT GRAM NEGATIVE RODS Performed at [facility]    Culture   Final    NO ANAEROBES ISOLATED Performed at [facility]    Report Status 09/25/2014 FINAL  Final  Culture, routine-abscess     Status: None   Collection Time: 09/20/14  2:46 PM  Result Value Ref Range Status   Specimen Description ABSCESS  Final   Special Requests RIGHT HAND PT ON VANCOMYCIN ZOSYN  Final   Gram Stain   Final    FEW WBC PRESENT, PREDOMINANTLY MONONUCLEAR NO SQUAMOUS EPITHELIAL CELLS SEEN ABUNDANT GRAM POSITIVE COCCI IN PAIRS IN CLUSTERS ABUNDANT GRAM NEGATIVE RODS Performed at [facility]    Culture   Final    MODERATE GROUP B STREP(S.AGALACTIAE)ISOLATED Note: TESTING AGAINST S. AGALACTIAE NOT ROUTINELY PERFORMED DUE TO PREDICTABILITY OF AMP/PEN/VAN SUSCEPTIBILITY. Performed at [facility]    Report Status 09/23/2014 FINAL  Final  MRSA PCR Screening     Status: Abnormal   Collection  Time: 09/20/14  6:45 PM  Result Value Ref Range Status   MRSA by PCR POSITIVE (A) NEGATIVE Final    Comment:        The GeneXpert MRSA Assay (FDA approved for NASAL specimens only), is one component of a comprehensive MRSA colonization surveillance program. It is not intended to diagnose MRSA infection nor to guide or monitor treatment for MRSA infections. RESULT CALLED TO, READ BACK BY AND VERIFIED WITH: T WOODS RN 2156 09/20/14 A BROWNING   Anaerobic culture     Status: None   Collection Time: 09/22/14 10:57 AM  Result Value Ref Range Status   Specimen Description ABSCESS  Final   Special Requests RIGHT HAND PT ON VANCOMYCIN ZOSYN  Final   Gram Stain   Final    NO WBC  SEEN NO SQUAMOUS EPITHELIAL CELLS SEEN FEW GRAM POSITIVE COCCI IN PAIRS Performed at [facility]    Culture   Final    NO ANAEROBES ISOLATED Performed at [facility]    Report Status 09/27/2014 FINAL  Final  Culture, routine-abscess     Status: None   Collection Time: 09/22/14 10:57 AM  Result Value Ref Range Status   Specimen Description ABSCESS  Final   Special Requests RIGHT HAND PT ON VANCOMYCIN ZOSYN  Final   Gram Stain   Final    NO WBC SEEN NO SQUAMOUS EPITHELIAL CELLS SEEN FEW GRAM POSITIVE COCCI IN PAIRS Performed at [facility]    Culture   Final    MODERATE ENTEROCOCCUS SPECIES Performed at [facility]    Report Status 09/25/2014 FINAL  Final   Organism ID, Bacteria ENTEROCOCCUS SPECIES  Final      Susceptibility   Enterococcus species - MIC*    VANCOMYCIN 1 SENSITIVE Sensitive     AMPICILLIN <=2 SENSITIVE Sensitive     * MODERATE ENTEROCOCCUS SPECIES    Anti-infectives    Start     Dose/Rate Route Frequency Ordered Stop   09/27/14 1200  vancomycin (VANCOCIN) IVPB 1000 mg/200 mL premix  Status:  Discontinued     1,000 mg 200 mL/hr over 60 Minutes Intravenous Every M-W-F (Hemodialysis) 09/25/14 0754 09/25/14 1023   09/25/14 1200  vancomycin (VANCOCIN) IVPB 750 mg/150 ml premix  Status:  Discontinued     750 mg 150 mL/hr over 60 Minutes Intravenous Once in dialysis 09/25/14 0754 09/25/14 1023   09/25/14 1200  ampicillin (OMNIPEN) 2 g in sodium chloride 0.9 % 50 mL IVPB     2 g 150 mL/hr over 20 Minutes Intravenous Every 12 hours 09/25/14 1051 10/03/14 2359   09/23/14 1200  vancomycin (VANCOCIN) IVPB 1000 mg/200 mL premix  Status:  Discontinued     1,000 mg 200 mL/hr over 60 Minutes Intravenous Every M-W-F (Hemodialysis) 09/21/14 0930 09/25/14 0754   09/21/14 1000  vancomycin (VANCOCIN) IVPB 1000 mg/200 mL premix     1,000 mg 200 mL/hr over 60 Minutes Intravenous  Once 09/21/14 0925 09/21/14 1150   09/20/14 2200   piperacillin-tazobactam (ZOSYN) IVPB 2.25 g  Status:  Discontinued     2.25 g 100 mL/hr over 30 Minutes Intravenous 3 times per day 09/20/14 1844 09/25/14 1023   09/20/14 1407  vancomycin (VANCOCIN) 1 GM/200ML IVPB    Comments:  Vira Agar, Beth   : cabinet override      09/20/14 1407 09/21/14 0214   09/20/14 0930  vancomycin (VANCOCIN) IVPB 1000 mg/200 mL premix     1,000 mg 200 mL/hr over 60  Minutes Intravenous  Once 09/20/14 0925 09/20/14 1115   09/20/14 0730  vancomycin (VANCOCIN) IVPB 1000 mg/200 mL premix     1,000 mg 200 mL/hr over 60 Minutes Intravenous  Once 09/20/14 0722 09/20/14 1009   09/20/14 0730  piperacillin-tazobactam (ZOSYN) IVPB 3.375 g     3.375 g 100 mL/hr over 30 Minutes Intravenous  Once 09/20/14 1840 09/20/14 0853      Assessment: 69 yo m admitted on 5/13 with non-healing right hand wound.  Pharmacy has been consulted to dose vancomycin and zosyn.  Patient has ESRD on HD MWF- currently undergoing a session in his ICU bed.  WBC wnl, afebrile. Off pressors and is transferring off the unit today  5/13 BCx: ngtd 5/13 right hand abscess: GBS, enterococcus sensitive to ampicillin and vancomycin  Vancomycin 5/13 >>5/18 Zosyn 5/13 >>5/18. Ampicillin 5/18>>  Goal of Therapy:  Pre-HD vancomycin trough 15-25 mcg/mL  Plan:  - Ampicillin 2 gm IV q12h with stop date of 5/26 entered - Continue to follow HD tolerance and other needs- if patient stays stable, pharmacy will sign off soon.   Dezyre Hoefer D. Lorren Splawn, PharmD, BCPS Clinical Pharmacist Pager: [phone] 09/27/2014 12:24 PM

## 2014-09-27 NOTE — Progress Notes (Signed)
Subjective: Interval History: has complaints uncomfortable turning.  Objective: Vital signs in last 24 hours: Temp:  [97.3 F (36.3 C)-98.7 F (37.1 C)] 97.9 F (36.6 C) (05/20 0308) Pulse Rate:  [52-104] 66 (05/20 0000) Resp:  [0-23] 8 (05/20 0500) BP: (52-139)/(11-105) 74/30 mmHg (05/20 0500) SpO2:  [82 %-100 %] 100 % (05/20 0000) Weight change:   Intake/Output from previous day: 05/19 0701 - 05/20 0700 In: 398.9 [I.V.:298.9; IV Piggyback:100] Out: -  Intake/Output this shift: Total I/O In: 150 [I.V.:100; IV Piggyback:50] Out: -   General appearance: cooperative, no distress and morbidly obese Resp: diminished breath sounds bilaterally Cardio: S1, S2 normal and systolic murmur: holsys M , do not hear rub 2/6, blowing at apex GI: obese, pos bs,liver down 5 cm Extremities: 2+ presacral edema, AVF RUA B&T, LE bilat AKA, FA amp on R with dressing  Lab Results:  Recent Labs  09/26/14 0532 09/27/14 0447  WBC 8.6 7.2  HGB 11.9* 10.4*  HCT 35.8* 30.7*  PLT 79* 75*   BMET:  Recent Labs  09/26/14 0532 09/27/14 0447  NA 134* 134*  K 4.2 4.3  CL 95* 97*  CO2 22 25  GLUCOSE 95 108*  BUN 24* 32*  CREATININE 4.94* 5.94*  CALCIUM 9.0 8.8*   No results for input(s): PTH in the last 72 hours. Iron Studies: No results for input(s): IRON, TIBC, TRANSFERRIN, FERRITIN in the last 72 hours.  Studies/Results: No results found.  I have reviewed the patient's current medications.  Assessment/Plan: 1 ESRD for HD will put limits on bp, risk.   2 Infx enterococcus, on Amp 3 DM controlled 4 Anemia follow closely, add epo 5 HPTH meds 6 PVD 3 amp now 7 low bp off pressors, no role for fluorinef with no renal function 8 CAD P HD, epo, stop fluoroinef, follow bp closely    LOS: 7 days   Jerell Demery L 09/27/2014,6:47 AM

## 2014-09-27 NOTE — Procedures (Signed)
I was present at this session.  I have reviewed the session itself and made appropriate changes.  HD starting via RUA avf. bp 70s  Julian [name] Evenson L 5/20/20166:47 AM

## 2014-09-27 NOTE — Progress Notes (Signed)
Attempted to call report to [facility]. Nurse not available. Will call back in 15 minutes

## 2014-09-28 DIAGNOSIS — K529 Noninfective gastroenteritis and colitis, unspecified: Secondary | ICD-10-CM

## 2014-09-28 DIAGNOSIS — D638 Anemia in other chronic diseases classified elsewhere: Secondary | ICD-10-CM | POA: Diagnosis present

## 2014-09-28 DIAGNOSIS — A419 Sepsis, unspecified organism: Secondary | ICD-10-CM | POA: Diagnosis present

## 2014-09-28 DIAGNOSIS — I9589 Other hypotension: Secondary | ICD-10-CM | POA: Diagnosis present

## 2014-09-28 DIAGNOSIS — I739 Peripheral vascular disease, unspecified: Secondary | ICD-10-CM | POA: Diagnosis present

## 2014-09-28 DIAGNOSIS — R6521 Severe sepsis with septic shock: Secondary | ICD-10-CM

## 2014-09-28 DIAGNOSIS — E119 Type 2 diabetes mellitus without complications: Secondary | ICD-10-CM

## 2014-09-28 DIAGNOSIS — A480 Gas gangrene: Secondary | ICD-10-CM | POA: Diagnosis present

## 2014-09-28 DIAGNOSIS — I5042 Chronic combined systolic (congestive) and diastolic (congestive) heart failure: Secondary | ICD-10-CM | POA: Diagnosis present

## 2014-09-28 LAB — GLUCOSE, CAPILLARY
GLUCOSE-CAPILLARY: 84 mg/dL (ref 65–99)
GLUCOSE-CAPILLARY: 134 mg/dL — AB (ref 65–99)
Glucose-Capillary: 86 mg/dL (ref 65–99)
Glucose-Capillary: 139 mg/dL — ABNORMAL HIGH (ref 65–99)

## 2014-09-28 LAB — RENAL FUNCTION PANEL
Albumin: 2 g/dL — ABNORMAL LOW (ref 3.5–5.0)
Anion gap: 13 (ref 5–15)
BUN: 21 mg/dL — AB (ref 6–20)
CALCIUM: 8.1 mg/dL — AB (ref 8.9–10.3)
CO2: 26 mmol/L (ref 22–32)
Chloride: 96 mmol/L — ABNORMAL LOW (ref 101–111)
Creatinine, Ser: 4.38 mg/dL — ABNORMAL HIGH (ref 0.61–1.24)
GFR calc non Af Amer: 13 mL/min — ABNORMAL LOW (ref >60)
GFR, EST AFRICAN AMERICAN: 15 mL/min — AB (ref >60)
Glucose, Bld: 85 mg/dL (ref 65–99)
Phosphorus: 4.4 mg/dL (ref 2.5–4.6)
Potassium: 3.8 mmol/L (ref 3.5–5.1)
SODIUM: 135 mmol/L (ref 135–145)

## 2014-09-28 NOTE — Progress Notes (Signed)
[facility] TEAM 1 - Stepdown/ICU TEAM Progress Note  Julian [name] ZTI:[id number] DOB: 06/01/1945 DOA: 09/20/2014 PCP: Vic Blackbird, MD  Admit HPI / Brief Narrative: Julian [name] is a 69 y.o. 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 Pt returned to ED on 5/13 with continued falls. During work up, hand noted to be actively draining and purulent. Pt's hand surgeon, Dr. Burney Gauze contacted through the ED and hospitalist consulted for consideration for admission.   HPI/Subjective: 5/21  A/O 3 (does not know when), NAD. 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 Monitor closely  CAD  Chronic diastolic and systolic CHF  -Patient's hypotension prevents typical CHF medication use.   HTN/Asymptomatic hypotension -Continue midodrine 10 mg TID   ESRD on HD M/W/F -HD per nephrology  Chronic diarrhea -Continue bentyl and lomotil  Anemia of chronic disease -Follow CBC  DMtype 2  -Continue sensitive SSI   Code Status: FULL Family Communication: no family present at time of exam Disposition Plan: SNF?    Consultants: Dr.James Deterding (nephrology) Dr.Douglas B McQuaid [facility] M)    Procedure/Significant Events: 09/18/2013 echocardiogram;- Left ventricle:  mild LVH.-LVEF=40% to 45%. -(grade 3 diastolic dysfunction). -Left atrium: The atrium  was mildly dilated. Head Ct 5/13 >> atrophy, old R occipital CVA, no acute findings R hand XR 5/13 >> dorsal swelling and gas, extending into 4 digits L hip XR 5/13 >> no acute fx, osteopenia L spine 5/13 >> no fx or acute abnormality Echo 5/16 >> LVEF 25-30%, RV mod dilated  R hand surgery debridement 5/13 OR debridement 5/15 Back on Neosynephrine late 5/15 R hand amputation 5/18, Bradycardiac overnight 5/18, dopamine added Stopped neo 5/19, BP ran low on cuff measurements but no change in mental status   Culture BCx2 5/13 >> neg UC 5/13 >> neg R hand wound 5/13 >> w/ gpc clusters and chains>>> group b strep Hand wound 5/15 > amp susceptible enterococcus  Antibiotics: Vanco 5/13 >> 5/18 Zosyn 5/13 >> 5/18  Plan ampicillin per pharmacy, plan 14 day course total (including time on vanc/zosyn)  DVT prophylaxis: Subcutaneous heparin   Devices    LINES / TUBES:      Continuous Infusions: . sodium chloride 10 mL/hr at 09/26/14 0800    Objective: VITAL SIGNS: Temp: 98.1 F (36.7 C) (05/21 2133) Temp Source: Oral (05/21 2133) BP: 101/86 mmHg (05/21 2133) Pulse Rate: 74 (05/21 2133) SPO2; FIO2:   Intake/Output Summary (Last 24 hours) at 09/28/14 2142 Last data filed at 09/28/14 2134  Gross per 24 hour  Intake   1330 ml  Output      0 ml  Net   1330 ml     Exam: General: A/O 3 (does not know when), No acute respiratory distress Eyes: Negative headache, eye pain, double vision, scotomas, floaters, negative retinal hemorrhage ENT: Negative Runny nose,negative tinnitus, negative gingival bleeding,  Neck:  Negative scars, masses, torticollis, lymphadenopathy, JVD Lungs: Clear to auscultation bilaterally without wheezes or crackles Cardiovascular: Regular rate and rhythm without murmur gallop or rub normal S1 and S2 Abdomen: negative abdominal pain, negative dysphagia, Nontender, nondistended, soft, bowel sounds positive, no rebound, no ascites, no appreciable  mass Extremities: No significant cyanosis, clubbing, or edema bilateral lower extremities; bilateral lower extremity AKA, acute right upper extremity hand amputation wrapped negative discharge Psychiatric:  Negative depression, negative anxiety, negative fatigue, negative mania Neurologic:  Cranial nerves II through XII intact, positive dysarthria, negative expressive aphasia, negative receptive aphasia.   Data Reviewed: Basic Metabolic Panel:  Recent Labs Lab 09/24/14 0505 09/24/14 1000 09/26/14 0532 09/27/14 0447 09/28/14 0428  NA 133* 132* 134* 134* 135  K 4.3 4.4 4.2 4.3 3.8  CL 95* 95* 95* 97* 96*  CO2 24 24 22 25 26   GLUCOSE 191* 214* 95 108* 85  BUN 35* 36* 24* 32* 21*  CREATININE 6.02* 6.22* 4.94* 5.94* 4.38*  CALCIUM 8.1* 8.3* 9.0 8.8* 8.1*  PHOS 5.0* 5.1* 5.5* 6.3* 4.4   Liver Function Tests:  Recent Labs Lab 09/23/14 0754 09/24/14 0505 09/24/14 1000 09/26/14 0532 09/27/14 0447 09/28/14 0428  AST 40  --   --  49*  --   --   ALT 15*  --   --  16*  --   --   ALKPHOS 114  --   --  136*  --   --   BILITOT 3.1*  --   --  3.7*  --   --   PROT 6.5  --   --  6.7  --   --   ALBUMIN 2.0* 2.3* 2.3* 2.4* 2.0* 2.0*   No results for input(s): LIPASE, AMYLASE in the last 168 hours. No results for input(s): AMMONIA in the last 168 hours. CBC:  Recent Labs Lab 09/24/14 0505 09/24/14 1000 09/25/14 0247 09/26/14 0532 09/27/14 0447  WBC 9.6 10.3 8.8 8.6 7.2  NEUTROABS  --   --   --  6.1  --   HGB 11.2* 11.6* 11.6* 11.9* 10.4*  HCT 33.3* 34.4* 34.0* 35.8* 30.7*  MCV 88.6 89.1 88.5 89.1 88.7  PLT 78* 81* 82* 79* 75*   Cardiac Enzymes: No results for input(s): CKTOTAL, CKMB, CKMBINDEX, TROPONINI in the last 168 hours. BNP (last 3 results) No results for input(s): BNP in the last 8760 hours.  ProBNP (last 3 results) No results for input(s): PROBNP in the last 8760 hours.  CBG:  Recent Labs Lab 09/27/14 1804 09/27/14 2116 09/28/14 0746 09/28/14 1155  09/28/14 1632  GLUCAP 108* 133* 86 84 134*    Recent Results (from the past 240 hour(s))  Urine culture     Status: None   Collection Time: 09/20/14  7:00 AM  Result Value Ref Range Status   Specimen Description URINE, CATHETERIZED  Final   Special Requests NONE  Final   Colony Count NO GROWTH Performed at [facility]   Final   Culture NO GROWTH Performed at [facility]   Final   Report Status 09/21/2014 FINAL  Final  Culture, blood (routine x 2)     Status: None   Collection Time: 09/20/14  7:48 AM  Result Value Ref Range Status   Specimen Description LEFT ANTECUBITAL  Final   Special Requests BOTTLES DRAWN AEROBIC ONLY [place]  Final   Culture NO GROWTH 5 DAYS  Final   Report Status 09/25/2014 FINAL  Final  Culture, blood (routine x 2)  Status: None   Collection Time: 09/20/14  8:30 AM  Result Value Ref Range Status   Specimen Description BLOOD LEFT HAND  Final   Special Requests BOTTLES DRAWN AEROBIC AND ANAEROBIC 6CC  Final   Culture NO GROWTH 5 DAYS  Final   Report Status 09/25/2014 FINAL  Final  Anaerobic culture     Status: None   Collection Time: 09/20/14  2:46 PM  Result Value Ref Range Status   Specimen Description ABSCESS  Final   Special Requests RIGHT HAND PT ON VACOMYCIN ZOSYN  Final   Gram Stain   Final    FEW WBC PRESENT, PREDOMINANTLY MONONUCLEAR NO SQUAMOUS EPITHELIAL CELLS SEEN ABUNDANT GRAM POSITIVE COCCI IN PAIRS IN CLUSTERS ABUNDANT GRAM NEGATIVE RODS Performed at [facility]    Culture   Final    NO ANAEROBES ISOLATED Performed at [facility]    Report Status 09/25/2014 FINAL  Final  Culture, routine-abscess     Status: None   Collection Time: 09/20/14  2:46 PM  Result Value Ref Range Status   Specimen Description ABSCESS  Final   Special Requests RIGHT HAND PT ON VANCOMYCIN ZOSYN  Final   Gram Stain   Final    FEW WBC PRESENT, PREDOMINANTLY MONONUCLEAR NO SQUAMOUS EPITHELIAL CELLS SEEN ABUNDANT GRAM  POSITIVE COCCI IN PAIRS IN CLUSTERS ABUNDANT GRAM NEGATIVE RODS Performed at [facility]    Culture   Final    MODERATE GROUP B STREP(S.AGALACTIAE)ISOLATED Note: TESTING AGAINST S. AGALACTIAE NOT ROUTINELY PERFORMED DUE TO PREDICTABILITY OF AMP/PEN/VAN SUSCEPTIBILITY. Performed at [facility]    Report Status 09/23/2014 FINAL  Final  MRSA PCR Screening     Status: Abnormal   Collection Time: 09/20/14  6:45 PM  Result Value Ref Range Status   MRSA by PCR POSITIVE (A) NEGATIVE Final    Comment:        The GeneXpert MRSA Assay (FDA approved for NASAL specimens only), is one component of a comprehensive MRSA colonization surveillance program. It is not intended to diagnose MRSA infection nor to guide or monitor treatment for MRSA infections. RESULT CALLED TO, READ BACK BY AND VERIFIED WITH: T Veola Cafaro RN [id number] 09/20/14 A BROWNING   Anaerobic culture     Status: None   Collection Time: 09/22/14 10:57 AM  Result Value Ref Range Status   Specimen Description ABSCESS  Final   Special Requests RIGHT HAND PT ON VANCOMYCIN ZOSYN  Final   Gram Stain   Final    NO WBC SEEN NO SQUAMOUS EPITHELIAL CELLS SEEN FEW GRAM POSITIVE COCCI IN PAIRS Performed at [facility]    Culture   Final    NO ANAEROBES ISOLATED Performed at [facility]    Report Status 09/27/2014 FINAL  Final  Culture, routine-abscess     Status: None   Collection Time: 09/22/14 10:57 AM  Result Value Ref Range Status   Specimen Description ABSCESS  Final   Special Requests RIGHT HAND PT ON VANCOMYCIN ZOSYN  Final   Gram Stain   Final    NO WBC SEEN NO SQUAMOUS EPITHELIAL CELLS SEEN FEW GRAM POSITIVE COCCI IN PAIRS Performed at [facility]    Culture   Final    MODERATE ENTEROCOCCUS SPECIES Performed at [facility]    Report Status 09/25/2014 FINAL  Final   Organism ID, Bacteria ENTEROCOCCUS SPECIES  Final      Susceptibility   Enterococcus species - MIC*  VANCOMYCIN 1 SENSITIVE Sensitive     AMPICILLIN <=2 SENSITIVE Sensitive     * MODERATE ENTEROCOCCUS SPECIES     Studies:  Recent x-ray studies have been reviewed in detail by the Attending Physician  Scheduled Meds:  Scheduled Meds: . ampicillin (OMNIPEN) IV  2 g Intravenous Q12H  . atorvastatin  80 mg Oral QPM  . cinacalcet  90 mg Oral Q supper  . dicyclomine  10 mg Oral TID AC & HS  . heparin  40 Units/kg Dialysis Once in dialysis  . heparin  5,000 Units Subcutaneous 3 times per day  . insulin aspart  0-5 Units Subcutaneous QHS  . insulin aspart  0-9 Units Subcutaneous TID WC  . midodrine  10 mg Oral TID WC  . multivitamin  1 tablet Oral QHS  . sevelamer carbonate  2,400 mg Oral BID WC  . sodium chloride  3 mL Intravenous Q12H    Time spent on care of this patient: 40 mins   Khelani Kops, Geraldo Docker , MD  [name] Office  [phone] Pager - [phone]  On-Call/Text Page:      [email or web address]      password [email or web address]  If 7PM-7AM, please contact night-coverage [email or web address] Password [email or web address] 09/28/2014, 9:42 PM   LOS: 8 days   Care during the described time interval was provided by me .  I have reviewed this patient's available data, including medical history, events of note, physical examination, and all test results as part of my evaluation. I have personally reviewed and interpreted all radiology studies.   Dia Crawford, MD [phone] Pager

## 2014-09-28 NOTE — Progress Notes (Signed)
Subjective: Interval History: has complaints hurting on backside.  Objective: Vital signs in last 24 hours: Temp:  [97.4 F (36.3 C)-98.4 F (36.9 C)] 98.4 F (36.9 C) (05/21 0900) Pulse Rate:  [70-88] 84 (05/21 0900) Resp:  [5-18] 18 (05/21 0900) BP: (74-117)/(31-74) 85/46 mmHg (05/21 0900) SpO2:  [95 %-100 %] 100 % (05/21 0900) Weight:  [104 kg (229 lb 4.5 oz)] 104 kg (229 lb 4.5 oz) (05/20 1147) Weight change: -2 kg (-4 lb 6.6 oz)  Intake/Output from previous day: 05/20 0701 - 05/21 0700 In: 610 [P.O.:600; I.V.:10] Out: 1002 [Urine:1; Stool:1] Intake/Output this shift: Total I/O In: 200 [P.O.:200] Out: -   General appearance: cooperative, morbidly obese and slowed mentation Resp: diminished breath sounds bilaterally Cardio: S1, S2 normal and systolic murmur: holosystolic 2/6, blowing at apex, do not hear rub GI: obese, pos bs,soft Extremities: AVF RUA B&T.  Bilat AKAs, R FA amp  Lab Results:  Recent Labs  09/26/14 0532 09/27/14 0447  WBC 8.6 7.2  HGB 11.9* 10.4*  HCT 35.8* 30.7*  PLT 79* 75*   BMET:  Recent Labs  09/27/14 0447 09/28/14 0428  NA 134* 135  K 4.3 3.8  CL 97* 96*  CO2 25 26  GLUCOSE 108* 85  BUN 32* 21*  CREATININE 5.94* 4.38*  CALCIUM 8.8* 8.1*   No results for input(s): PTH in the last 72 hours. Iron Studies: No results for input(s): IRON, TIBC, TRANSFERRIN, FERRITIN in the last 72 hours.  Studies/Results: No results found.  I have reviewed the patient's current medications.  Assessment/Plan: 1 ESRD HD MWF 2 hypotension stable 3 Anemia stable 4 HPTH meds 5 DM controlled 6 PVD 7 RFA amp per hand.  On Amp for enterococcus P HD MWF, Amp, epo, Vit D    LOS: 8 days   Mete Purdum L 09/28/2014,10:59 AM

## 2014-09-29 DIAGNOSIS — I739 Peripheral vascular disease, unspecified: Secondary | ICD-10-CM

## 2014-09-29 DIAGNOSIS — D638 Anemia in other chronic diseases classified elsewhere: Secondary | ICD-10-CM

## 2014-09-29 LAB — COMPREHENSIVE METABOLIC PANEL
ALK PHOS: 139 U/L — AB (ref 38–126)
ALT: 24 U/L (ref 17–63)
ANION GAP: 13 (ref 5–15)
AST: 65 U/L — AB (ref 15–41)
Albumin: 2.1 g/dL — ABNORMAL LOW (ref 3.5–5.0)
BILIRUBIN TOTAL: 4.9 mg/dL — AB (ref 0.3–1.2)
BUN: 27 mg/dL — AB (ref 6–20)
CALCIUM: 7.6 mg/dL — AB (ref 8.9–10.3)
CHLORIDE: 95 mmol/L — AB (ref 101–111)
CO2: 24 mmol/L (ref 22–32)
CREATININE: 5.66 mg/dL — AB (ref 0.61–1.24)
GFR, EST AFRICAN AMERICAN: 11 mL/min — AB (ref >60)
GFR, EST NON AFRICAN AMERICAN: 9 mL/min — AB (ref >60)
Glucose, Bld: 91 mg/dL (ref 65–99)
Potassium: 3.9 mmol/L (ref 3.5–5.1)
SODIUM: 132 mmol/L — AB (ref 135–145)
Total Protein: 6.8 g/dL (ref 6.5–8.1)

## 2014-09-29 LAB — CBC WITH DIFFERENTIAL/PLATELET
Basophils Absolute: 0 K/uL (ref 0.0–0.1)
Basophils Relative: 0 % (ref 0–1)
Eosinophils Absolute: 0.1 K/uL (ref 0.0–0.7)
Eosinophils Relative: 1 % (ref 0–5)
HCT: 32.6 % — ABNORMAL LOW (ref 39.0–52.0)
Hemoglobin: 11.1 g/dL — ABNORMAL LOW (ref 13.0–17.0)
Lymphocytes Relative: 21 % (ref 12–46)
Lymphs Abs: 1.4 K/uL (ref 0.7–4.0)
MCH: 30.1 pg (ref 26.0–34.0)
MCHC: 34 g/dL (ref 30.0–36.0)
MCV: 88.3 fL (ref 78.0–100.0)
Monocytes Absolute: 0.6 K/uL (ref 0.1–1.0)
Monocytes Relative: 10 % (ref 3–12)
Neutro Abs: 4.5 K/uL (ref 1.7–7.7)
Neutrophils Relative %: 68 % (ref 43–77)
Platelets: 63 K/uL — ABNORMAL LOW (ref 150–400)
RBC: 3.69 MIL/uL — ABNORMAL LOW (ref 4.22–5.81)
RDW: 18.9 % — ABNORMAL HIGH (ref 11.5–15.5)
WBC: 6.5 K/uL (ref 4.0–10.5)

## 2014-09-29 LAB — LIPID PANEL
CHOLESTEROL: 56 mg/dL (ref 0–200)
HDL: <10 mg/dL — ABNORMAL LOW (ref >40)
Triglycerides: 103 mg/dL (ref <150)
VLDL: 21 mg/dL (ref 0–40)

## 2014-09-29 LAB — GLUCOSE, CAPILLARY
GLUCOSE-CAPILLARY: 117 mg/dL — AB (ref 65–99)
GLUCOSE-CAPILLARY: 76 mg/dL (ref 65–99)
Glucose-Capillary: 117 mg/dL — ABNORMAL HIGH (ref 65–99)
Glucose-Capillary: 167 mg/dL — ABNORMAL HIGH (ref 65–99)
Glucose-Capillary: 61 mg/dL — ABNORMAL LOW (ref 65–99)

## 2014-09-29 LAB — MAGNESIUM: Magnesium: 1.8 mg/dL (ref 1.7–2.4)

## 2014-09-29 MED ORDER — VANCOMYCIN HCL IN DEXTROSE 1-5 GM/200ML-% IV SOLN
1000.0000 mg | INTRAVENOUS | Status: DC
  Administered 2014-09-30: 1000 mg via INTRAVENOUS
  Filled 2014-09-29 (×2): qty 200

## 2014-09-29 MED ORDER — TICAGRELOR 90 MG PO TABS
90.0000 mg | ORAL_TABLET | Freq: Two times a day (BID) | ORAL | Status: DC
  Administered 2014-09-29 – 2014-09-30 (×3): 90 mg via ORAL
  Filled 2014-09-29 (×5): qty 1

## 2014-09-29 NOTE — Progress Notes (Signed)
Progress Note  Julian [name] JKK:[id number] DOB: [birth year] DOA: 09/20/2014 PCP: Vic Blackbird, MD  Brief Narrative: Julian [name] is a 69 y.o. 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 Pt returned to ED on 5/13 with continued falls. During work up, hand noted to be actively draining and purulent. Pt's hand surgeon, Dr. Burney Gauze contacted. Patient was admitted to the hospital. Underwent surgery and had to undergo amputation of the right hand.   Subjective: Patient denies any complaints today. He wants to be repositioned.   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 May need to transition to vancomycin for ease of administration with dialysis once he is discharged. Will need to get antibiotics till May 27.  Persistent hypotension  - cuff pressures are low but mental status at baseline; would ignore and less patient's mental status declines acutely  Severe PVD -Monitor closely. Resume patient's antiplatelet agents.  CAD  Chronic diastolic and systolic CHF  -Patient's hypotension prevents typical CHF medication use.   HTN/Asymptomatic hypotension -Continue midodrine 10 mg TID   ESRD on HD M/W/F -HD per nephrology  Chronic diarrhea -Continue bentyl and lomotil  Anemia of chronic disease -Follow CBC  DMtype 2  -Continue sensitive SSI  DVT prophylaxis: Subcutaneous heparin Code Status: FULL Family Communication: no family present at time of exam Disposition Plan: SNF?[facility] Possibly as early as tomorrow   Consultants: Dr.James Deterding (nephrology) Dr.Douglas B McQuaid [facility]  M)  Procedure/Significant Events: 09/18/2013 echocardiogram;- Left ventricle:  mild LVH.-LVEF=40% to 45%. -(grade 3 diastolic dysfunction). -Left atrium: The atrium was mildly dilated. Head Ct 5/13 >> atrophy, old R occipital CVA, no acute findings R hand XR 5/13 >> dorsal swelling and gas, extending into 4 digits L hip XR 5/13 >> no acute fx, osteopenia L spine 5/13 >> no fx or acute abnormality Echo 5/16 >> LVEF 25-30%, RV mod dilated  R hand surgery debridement 5/13 OR debridement 5/15 Back on Neosynephrine late 5/15 R hand amputation 5/18, Bradycardiac overnight 5/18, dopamine added Stopped neo 5/19, BP ran low on cuff measurements but no change in mental status   Culture BCx2 5/13 >> neg UC 5/13 >> neg R hand wound 5/13 >> w/ gpc clusters and chains>>> group b strep Hand wound 5/15 > amp susceptible enterococcus  Antibiotics: Vanco 5/13 >> 5/18 Zosyn 5/13 >> 5/18  Plan ampicillin per pharmacy, plan 14 day course total (including time on vanc/zosyn)  LINES / TUBES:  he has a central line   Objective: VITAL SIGNS: Temp: 97.8 F (36.6 C) (05/22 0459) Temp Source: Oral (05/22 0459) BP: 114/33 mmHg (05/22 0459) Pulse Rate: 75 (05/22 0459) SPO2; FIO2:   Intake/Output Summary (Last 24 hours) at 09/29/14 0852 Last data filed at 09/29/14 0654  Gross per 24 hour  Intake    850 ml  Output      0 ml  Net    850 ml     Exam: General: In no distress. Lungs: Clear to auscultation bilaterally without wheezes or crackles Cardiovascular: Regular rate and rhythm without murmur gallop or rub normal S1 and S2 Abdomen: Soft, nontender, nondistended. Bowel sounds are present. No  masses or organomegaly.  Extremities: Right upper extremity stump and a dressing. He is also status post bilateral lower extremity AKA.  Neurologic:  No facial asymmetry. Moving all his extremities. No focal deficits.   Data Reviewed: Basic Metabolic Panel:  Recent Labs Lab 09/24/14 0505  09/24/14 1000 09/26/14 0532 09/27/14 0447 09/28/14 0428 09/29/14 0651  NA 133* 132* 134* 134* 135 132*  K 4.3 4.4 4.2 4.3 3.8 3.9  CL 95* 95* 95* 97* 96* 95*  CO2 24 24 22 25 26 24   GLUCOSE 191* 214* 95 108* 85 91  BUN 35* 36* 24* 32* 21* 27*  CREATININE 6.02* 6.22* 4.94* 5.94* 4.38* 5.66*  CALCIUM 8.1* 8.3* 9.0 8.8* 8.1* 7.6*  MG  --   --   --   --   --  1.8  PHOS 5.0* 5.1* 5.5* 6.3* 4.4  --    Liver Function Tests:  Recent Labs Lab 09/23/14 0754  09/24/14 1000 09/26/14 0532 09/27/14 0447 09/28/14 0428 09/29/14 0651  AST 40  --   --  49*  --   --  65*  ALT 15*  --   --  16*  --   --  24  ALKPHOS 114  --   --  136*  --   --  139*  BILITOT 3.1*  --   --  3.7*  --   --  4.9*  PROT 6.5  --   --  6.7  --   --  6.8  ALBUMIN 2.0*  < > 2.3* 2.4* 2.0* 2.0* 2.1*  < > = values in this interval not displayed.  CBC:  Recent Labs Lab 09/24/14 1000 09/25/14 0247 09/26/14 0532 09/27/14 0447 09/29/14 0651  WBC 10.3 8.8 8.6 7.2 6.5  NEUTROABS  --   --  6.1  --  4.5  HGB 11.6* 11.6* 11.9* 10.4* 11.1*  HCT 34.4* 34.0* 35.8* 30.7* 32.6*  MCV 89.1 88.5 89.1 88.7 88.3  PLT 81* 82* 79* 75* 63*   CBG:  Recent Labs Lab 09/28/14 0746 09/28/14 1155 09/28/14 1632 09/28/14 2118 09/29/14 0740  GLUCAP 86 84 134* 139* 117*    Recent Results (from the past 240 hour(s))  Urine culture     Status: None   Collection Time: 09/20/14  7:00 AM  Result Value Ref Range Status   Specimen Description URINE, CATHETERIZED  Final   Special Requests NONE  Final   Colony Count NO GROWTH Performed at [facility]   Final   Culture NO GROWTH Performed at [facility]   Final   Report Status 09/21/2014 FINAL  Final  Culture, blood (routine x 2)     Status: None   Collection Time: 09/20/14  7:48 AM  Result Value Ref Range Status   Specimen Description LEFT ANTECUBITAL  Final   Special Requests BOTTLES DRAWN AEROBIC ONLY 6CC  Final   Culture NO GROWTH 5 DAYS  Final    Report Status 09/25/2014 FINAL  Final  Culture, blood (routine x 2)     Status: None   Collection Time: 09/20/14  8:30 AM  Result Value Ref Range Status   Specimen Description BLOOD LEFT HAND  Final   Special Requests BOTTLES DRAWN AEROBIC AND ANAEROBIC 6CC  Final   Culture NO GROWTH 5 DAYS  Final   Report Status 09/25/2014 FINAL  Final  Anaerobic culture     Status: None   Collection Time: 09/20/14  2:46 PM  Result Value Ref Range Status  Specimen Description ABSCESS  Final   Special Requests RIGHT HAND PT ON VACOMYCIN ZOSYN  Final   Gram Stain   Final    FEW WBC PRESENT, PREDOMINANTLY MONONUCLEAR NO SQUAMOUS EPITHELIAL CELLS SEEN ABUNDANT GRAM POSITIVE COCCI IN PAIRS IN CLUSTERS ABUNDANT GRAM NEGATIVE RODS Performed at [facility]    Culture   Final    NO ANAEROBES ISOLATED Performed at [facility]    Report Status 09/25/2014 FINAL  Final  Culture, routine-abscess     Status: None   Collection Time: 09/20/14  2:46 PM  Result Value Ref Range Status   Specimen Description ABSCESS  Final   Special Requests RIGHT HAND PT ON VANCOMYCIN ZOSYN  Final   Gram Stain   Final    FEW WBC PRESENT, PREDOMINANTLY MONONUCLEAR NO SQUAMOUS EPITHELIAL CELLS SEEN ABUNDANT GRAM POSITIVE COCCI IN PAIRS IN CLUSTERS ABUNDANT GRAM NEGATIVE RODS Performed at [facility]    Culture   Final    MODERATE GROUP B STREP(S.AGALACTIAE)ISOLATED Note: TESTING AGAINST S. AGALACTIAE NOT ROUTINELY PERFORMED DUE TO PREDICTABILITY OF AMP/PEN/VAN SUSCEPTIBILITY. Performed at [facility]    Report Status 09/23/2014 FINAL  Final  MRSA PCR Screening     Status: Abnormal   Collection Time: 09/20/14  6:45 PM  Result Value Ref Range Status   MRSA by PCR POSITIVE (A) NEGATIVE Final    Comment:        The GeneXpert MRSA Assay (FDA approved for NASAL specimens only), is one component of a comprehensive MRSA colonization surveillance program. It is not intended to diagnose  MRSA infection nor to guide or monitor treatment for MRSA infections. RESULT CALLED TO, READ BACK BY AND VERIFIED WITH: T WOODS RN [id number] 09/20/14 A BROWNING   Anaerobic culture     Status: None   Collection Time: 09/22/14 10:57 AM  Result Value Ref Range Status   Specimen Description ABSCESS  Final   Special Requests RIGHT HAND PT ON VANCOMYCIN ZOSYN  Final   Gram Stain   Final    NO WBC SEEN NO SQUAMOUS EPITHELIAL CELLS SEEN FEW GRAM POSITIVE COCCI IN PAIRS Performed at [facility]    Culture   Final    NO ANAEROBES ISOLATED Performed at [facility]    Report Status 09/27/2014 FINAL  Final  Culture, routine-abscess     Status: None   Collection Time: 09/22/14 10:57 AM  Result Value Ref Range Status   Specimen Description ABSCESS  Final   Special Requests RIGHT HAND PT ON VANCOMYCIN ZOSYN  Final   Gram Stain   Final    NO WBC SEEN NO SQUAMOUS EPITHELIAL CELLS SEEN FEW GRAM POSITIVE COCCI IN PAIRS Performed at [facility]    Culture   Final    MODERATE ENTEROCOCCUS SPECIES Performed at [facility]    Report Status 09/25/2014 FINAL  Final   Organism ID, Bacteria ENTEROCOCCUS SPECIES  Final      Susceptibility   Enterococcus species - MIC*    VANCOMYCIN 1 SENSITIVE Sensitive     AMPICILLIN <=2 SENSITIVE Sensitive     * MODERATE ENTEROCOCCUS SPECIES     Scheduled Meds:  Scheduled Meds: . ampicillin (OMNIPEN) IV  2 g Intravenous Q12H  . atorvastatin  80 mg Oral QPM  . cinacalcet  90 mg Oral Q supper  . dicyclomine  10 mg Oral TID AC & HS  . heparin  40 Units/kg Dialysis Once in dialysis  . heparin  5,000  Units Subcutaneous 3 times per day  . insulin aspart  0-5 Units Subcutaneous QHS  . insulin aspart  0-9 Units Subcutaneous TID WC  . midodrine  10 mg Oral TID WC  . multivitamin  1 tablet Oral QHS  . sevelamer carbonate  2,400 mg Oral BID WC  . sodium chloride  3 mL Intravenous Shade Flood , MD  [facility] Pager [phone]  On-Call/Text Page:      [email or web address]      password [email or web address]  If 7PM-7AM, please contact night-coverage [email or web address] Password [email or web address] 09/29/2014, 8:52 AM   LOS: 9 days

## 2014-09-29 NOTE — Progress Notes (Signed)
CSW gave patient SNF bed offers- he was open to any of the offers in [place] or [place]; stating, "I don't wanna go to [facility] in [place]". Patient was pleasant; asking how long he would need to stay at SNF- advised it would be until he was able to go home and care for self.  CSW will have weekday CSW follow up for final dc plans/ SNF selection tomorrow- Eduard Clos, MSW, LCSWA Weekend coverage

## 2014-09-29 NOTE — Progress Notes (Signed)
Subjective: Interval History: has no complaint this am.  Objective: Vital signs in last 24 hours: Temp:  [97.8 F (36.6 C)-98.1 F (36.7 C)] 97.8 F (36.6 C) (05/22 0459) Pulse Rate:  [74-77] 75 (05/22 0459) Resp:  [17-18] 18 (05/22 0459) BP: (86-114)/(33-86) 114/33 mmHg (05/22 0459) SpO2:  [98 %-99 %] 98 % (05/22 0459) Weight:  [104.2 kg (229 lb 11.5 oz)] 104.2 kg (229 lb 11.5 oz) (05/22 0459) Weight change: 0.2 kg (7.1 oz)  Intake/Output from previous day: 05/21 0701 - 05/22 0700 In: 850 [P.O.:680; I.V.:120; IV Piggyback:50] Out: 0  Intake/Output this shift:    General appearance: cooperative, no distress and morbidly obese Neck: L IJ cath Resp: diminished breath sounds bilaterally and rales bibasilar Cardio: S1, S2 normal and systolic murmur: holosystolic 2/6, blowing at apex GI: obese, pos bs, soft Extremities: edema 2+ presacral and AVF RUA b&t,  bilat AKA, RFA amp  Lab Results:  Recent Labs  09/27/14 0447 09/29/14 0651  WBC 7.2 6.5  HGB 10.4* 11.1*  HCT 30.7* 32.6*  PLT 75* 63*   BMET:  Recent Labs  09/28/14 0428 09/29/14 0651  NA 135 132*  K 3.8 3.9  CL 96* 95*  CO2 26 24  GLUCOSE 85 91  BUN 21* 27*  CREATININE 4.38* 5.66*  CALCIUM 8.1* 7.6*   No results for input(s): PTH in the last 72 hours. Iron Studies: No results for input(s): IRON, TIBC, TRANSFERRIN, FERRITIN in the last 72 hours.  Studies/Results: No results found.  I have reviewed the patient's current medications.  Assessment/Plan: 1 ESRD for HD vol xs, on exam and low SNa.  HD in am 2 Low bp keep >65 on hd 3 DM controlled 4 PVD 5 R FA amp per Hand surgery on AMP  ? duration 6Anemia stable 7 HPTH 8 Obesity limiting recovery P HD, epo, AB, mobilize,     LOS: 9 days   Rico Massar L 09/29/2014,9:36 AM

## 2014-09-29 NOTE — Progress Notes (Signed)
Hypoglycemic Event  CBG: 61  Treatment: 15 GM carbohydrate snack  Symptoms: None  Follow-up CBG: Time:2230 CBG Result:76  Possible Reasons for Event: Inadequate meal intake      Heaven Meeker, [name]  Remember to initiate Hypoglycemia Order Set & complete

## 2014-09-29 NOTE — Progress Notes (Addendum)
ANTIBIOTIC CONSULT NOTE - FOLLOW UP  Pharmacy Consult for Ampicillin >> Vancomycin Indication: cellulitis  No Known Allergies  Patient Measurements: Height: 5\' 10"  (177.8 cm) Weight: 229 lb 11.5 oz (104.2 kg) IBW/kg (Calculated) : 73  Vital Signs: Temp: 97.8 F (36.6 C) (05/22 0459) Temp Source: Oral (05/22 0459) BP: 114/33 mmHg (05/22 0459) Pulse Rate: 75 (05/22 0459) Intake/Output from previous day: 05/21 0701 - 05/22 0700 In: 850 [P.O.:680; I.V.:120; IV Piggyback:50] Out: 0  Intake/Output from this shift:    Labs:  Recent Labs  09/27/14 0447 09/28/14 0428 09/29/14 0651  WBC 7.2  --  6.5  HGB 10.4*  --  11.1*  PLT 75*  --  63*  CREATININE 5.94* 4.38* 5.66*   Estimated Creatinine Clearance: 15.3 mL/min (by C-G formula based on Cr of 5.66). No results for input(s): VANCOTROUGH, VANCOPEAK, VANCORANDOM, GENTTROUGH, GENTPEAK, GENTRANDOM, TOBRATROUGH, TOBRAPEAK, TOBRARND, AMIKACINPEAK, AMIKACINTROU, AMIKACIN in the last 72 hours.   Microbiology: Recent Results (from the past 720 hour(s))  Urine culture     Status: None   Collection Time: 09/20/14  7:00 AM  Result Value Ref Range Status   Specimen Description URINE, CATHETERIZED  Final   Special Requests NONE  Final   Colony Count NO GROWTH Performed at [facility]   Final   Culture NO GROWTH Performed at [facility]   Final   Report Status 09/21/2014 FINAL  Final  Culture, blood (routine x 2)     Status: None   Collection Time: 09/20/14  7:48 AM  Result Value Ref Range Status   Specimen Description LEFT ANTECUBITAL  Final   Special Requests BOTTLES DRAWN AEROBIC ONLY 6CC  Final   Culture NO GROWTH 5 DAYS  Final   Report Status 09/25/2014 FINAL  Final  Culture, blood (routine x 2)     Status: None   Collection Time: 09/20/14  8:30 AM  Result Value Ref Range Status   Specimen Description BLOOD LEFT HAND  Final   Special Requests BOTTLES DRAWN AEROBIC AND ANAEROBIC 6CC  Final   Culture NO  GROWTH 5 DAYS  Final   Report Status 09/25/2014 FINAL  Final  Anaerobic culture     Status: None   Collection Time: 09/20/14  2:46 PM  Result Value Ref Range Status   Specimen Description ABSCESS  Final   Special Requests RIGHT HAND PT ON VACOMYCIN ZOSYN  Final   Gram Stain   Final    FEW WBC PRESENT, PREDOMINANTLY MONONUCLEAR NO SQUAMOUS EPITHELIAL CELLS SEEN ABUNDANT GRAM POSITIVE COCCI IN PAIRS IN CLUSTERS ABUNDANT GRAM NEGATIVE RODS Performed at [facility]    Culture   Final    NO ANAEROBES ISOLATED Performed at [facility]    Report Status 09/25/2014 FINAL  Final  Culture, routine-abscess     Status: None   Collection Time: 09/20/14  2:46 PM  Result Value Ref Range Status   Specimen Description ABSCESS  Final   Special Requests RIGHT HAND PT ON VANCOMYCIN ZOSYN  Final   Gram Stain   Final    FEW WBC PRESENT, PREDOMINANTLY MONONUCLEAR NO SQUAMOUS EPITHELIAL CELLS SEEN ABUNDANT GRAM POSITIVE COCCI IN PAIRS IN CLUSTERS ABUNDANT GRAM NEGATIVE RODS Performed at [facility]    Culture   Final    MODERATE GROUP B STREP(S.AGALACTIAE)ISOLATED Note: TESTING AGAINST S. AGALACTIAE NOT ROUTINELY PERFORMED DUE TO PREDICTABILITY OF AMP/PEN/VAN SUSCEPTIBILITY. Performed at [facility]    Report Status 09/23/2014 FINAL  Final  MRSA  PCR Screening     Status: Abnormal   Collection Time: 09/20/14  6:45 PM  Result Value Ref Range Status   MRSA by PCR POSITIVE (A) NEGATIVE Final    Comment:        The GeneXpert MRSA Assay (FDA approved for NASAL specimens only), is one component of a comprehensive MRSA colonization surveillance program. It is not intended to diagnose MRSA infection nor to guide or monitor treatment for MRSA infections. RESULT CALLED TO, READ BACK BY AND VERIFIED WITH: T WOODS RN [id number] 09/20/14 A BROWNING   Anaerobic culture     Status: None   Collection Time: 09/22/14 10:57 AM  Result Value Ref Range Status   Specimen  Description ABSCESS  Final   Special Requests RIGHT HAND PT ON VANCOMYCIN ZOSYN  Final   Gram Stain   Final    NO WBC SEEN NO SQUAMOUS EPITHELIAL CELLS SEEN FEW GRAM POSITIVE COCCI IN PAIRS Performed at [facility]    Culture   Final    NO ANAEROBES ISOLATED Performed at [facility]    Report Status 09/27/2014 FINAL  Final  Culture, routine-abscess     Status: None   Collection Time: 09/22/14 10:57 AM  Result Value Ref Range Status   Specimen Description ABSCESS  Final   Special Requests RIGHT HAND PT ON VANCOMYCIN ZOSYN  Final   Gram Stain   Final    NO WBC SEEN NO SQUAMOUS EPITHELIAL CELLS SEEN FEW GRAM POSITIVE COCCI IN PAIRS Performed at [facility]    Culture   Final    MODERATE ENTEROCOCCUS SPECIES Performed at [facility]    Report Status 09/25/2014 FINAL  Final   Organism ID, Bacteria ENTEROCOCCUS SPECIES  Final      Susceptibility   Enterococcus species - MIC*    VANCOMYCIN 1 SENSITIVE Sensitive     AMPICILLIN <=2 SENSITIVE Sensitive     * MODERATE ENTEROCOCCUS SPECIES    Anti-infectives    Start     Dose/Rate Route Frequency Ordered Stop   09/27/14 1200  vancomycin (VANCOCIN) IVPB 1000 mg/200 mL premix  Status:  Discontinued     1,000 mg 200 mL/hr over 60 Minutes Intravenous Every M-W-F (Hemodialysis) 09/25/14 0754 09/25/14 1023   09/25/14 1200  vancomycin (VANCOCIN) IVPB 750 mg/150 ml premix  Status:  Discontinued     750 mg 150 mL/hr over 60 Minutes Intravenous Once in dialysis 09/25/14 0754 09/25/14 1023   09/25/14 1200  ampicillin (OMNIPEN) 2 g in sodium chloride 0.9 % 50 mL IVPB     2 g 150 mL/hr over 20 Minutes Intravenous Every 12 hours 09/25/14 1051 09/30/14 0959   09/23/14 1200  vancomycin (VANCOCIN) IVPB 1000 mg/200 mL premix  Status:  Discontinued     1,000 mg 200 mL/hr over 60 Minutes Intravenous Every M-W-F (Hemodialysis) 09/21/14 0930 09/25/14 0754   09/21/14 1000  vancomycin (VANCOCIN) IVPB 1000 mg/200  mL premix     1,000 mg 200 mL/hr over 60 Minutes Intravenous  Once 09/21/14 0925 09/21/14 1150   09/20/14 2200  piperacillin-tazobactam (ZOSYN) IVPB 2.25 g  Status:  Discontinued     2.25 g 100 mL/hr over 30 Minutes Intravenous 3 times per day 09/20/14 1844 09/25/14 1023   09/20/14 1407  vancomycin (VANCOCIN) 1 GM/200ML IVPB    Comments:  Vira Agar, Beth   : cabinet override      09/20/14 1407 09/21/14 0214   09/20/14 0930  vancomycin (VANCOCIN) IVPB 1000 mg/200 mL  premix     1,000 mg 200 mL/hr over 60 Minutes Intravenous  Once 09/20/14 0925 09/20/14 1115   09/20/14 0730  vancomycin (VANCOCIN) IVPB 1000 mg/200 mL premix     1,000 mg 200 mL/hr over 60 Minutes Intravenous  Once 09/20/14 0722 09/20/14 1009   09/20/14 0730  piperacillin-tazobactam (ZOSYN) IVPB 3.375 g     3.375 g 100 mL/hr over 30 Minutes Intravenous  Once 09/20/14 0539 09/20/14 0853      Assessment: 69yo male w/ ESRD on HD-MWF admitted on 5/13 with non-healing right hand wound.  Pt initially treated w/ vanc >> de-escalated to ampicillin based on sensitivies >> now transitioning back to vanc for ease of dosing w/ HD (only to complete short course).  Pharmacy has been consulted to dose vancomycin.  Pt remains afebrile, WBC wnl.  5/13 BCx: negative 5/13 right hand abscess: GBS, enterococcus sensitive to ampicillin and vancomycin  Zosyn 5/13 >> 5/18 Vancomycin 5/13 >> 5/18 Ampicillin 5/18 >> 5/22 Vancomycin 5/23 >> 5/25  Goal of Therapy:  Pre-HD vancomycin trough 15-25 mcg/mL  Plan:  - Discontinue ampicillin after 2200 dose tonight 5/22 - Vancomycin 1000mg  IV qHD x2 (5/23 and 5/25) - Last dose of vanc should be post-HD on 5/25  Drucie Opitz, PharmD Clinical Pharmacy Resident Pager: [phone] 09/29/2014 9:45 AM

## 2014-09-30 DIAGNOSIS — N185 Chronic kidney disease, stage 5: Secondary | ICD-10-CM | POA: Diagnosis not present

## 2014-09-30 DIAGNOSIS — I951 Orthostatic hypotension: Secondary | ICD-10-CM | POA: Diagnosis not present

## 2014-09-30 DIAGNOSIS — N186 End stage renal disease: Secondary | ICD-10-CM | POA: Diagnosis not present

## 2014-09-30 DIAGNOSIS — I2510 Atherosclerotic heart disease of native coronary artery without angina pectoris: Secondary | ICD-10-CM | POA: Diagnosis not present

## 2014-09-30 DIAGNOSIS — I739 Peripheral vascular disease, unspecified: Secondary | ICD-10-CM | POA: Diagnosis not present

## 2014-09-30 DIAGNOSIS — Z89111 Acquired absence of right hand: Secondary | ICD-10-CM | POA: Diagnosis not present

## 2014-09-30 DIAGNOSIS — Z89611 Acquired absence of right leg above knee: Secondary | ICD-10-CM | POA: Diagnosis not present

## 2014-09-30 DIAGNOSIS — S58011A Complete traumatic amputation at elbow level, right arm, initial encounter: Secondary | ICD-10-CM | POA: Diagnosis not present

## 2014-09-30 DIAGNOSIS — N19 Unspecified kidney failure: Secondary | ICD-10-CM | POA: Diagnosis not present

## 2014-09-30 DIAGNOSIS — E875 Hyperkalemia: Secondary | ICD-10-CM | POA: Diagnosis not present

## 2014-09-30 DIAGNOSIS — L0390 Cellulitis, unspecified: Secondary | ICD-10-CM | POA: Diagnosis not present

## 2014-09-30 DIAGNOSIS — D509 Iron deficiency anemia, unspecified: Secondary | ICD-10-CM | POA: Diagnosis not present

## 2014-09-30 DIAGNOSIS — E785 Hyperlipidemia, unspecified: Secondary | ICD-10-CM | POA: Diagnosis not present

## 2014-09-30 DIAGNOSIS — D649 Anemia, unspecified: Secondary | ICD-10-CM | POA: Diagnosis not present

## 2014-09-30 DIAGNOSIS — Z89612 Acquired absence of left leg above knee: Secondary | ICD-10-CM | POA: Diagnosis not present

## 2014-09-30 DIAGNOSIS — M6281 Muscle weakness (generalized): Secondary | ICD-10-CM | POA: Diagnosis not present

## 2014-09-30 DIAGNOSIS — I509 Heart failure, unspecified: Secondary | ICD-10-CM | POA: Diagnosis not present

## 2014-09-30 DIAGNOSIS — E119 Type 2 diabetes mellitus without complications: Secondary | ICD-10-CM | POA: Diagnosis not present

## 2014-09-30 DIAGNOSIS — I10 Essential (primary) hypertension: Secondary | ICD-10-CM | POA: Diagnosis not present

## 2014-09-30 DIAGNOSIS — Z794 Long term (current) use of insulin: Secondary | ICD-10-CM | POA: Diagnosis not present

## 2014-09-30 DIAGNOSIS — K529 Noninfective gastroenteritis and colitis, unspecified: Secondary | ICD-10-CM | POA: Diagnosis not present

## 2014-09-30 DIAGNOSIS — A419 Sepsis, unspecified organism: Secondary | ICD-10-CM | POA: Diagnosis not present

## 2014-09-30 DIAGNOSIS — N2581 Secondary hyperparathyroidism of renal origin: Secondary | ICD-10-CM | POA: Diagnosis not present

## 2014-09-30 DIAGNOSIS — D631 Anemia in chronic kidney disease: Secondary | ICD-10-CM | POA: Diagnosis not present

## 2014-09-30 DIAGNOSIS — R259 Unspecified abnormal involuntary movements: Secondary | ICD-10-CM | POA: Diagnosis not present

## 2014-09-30 DIAGNOSIS — L03119 Cellulitis of unspecified part of limb: Secondary | ICD-10-CM | POA: Diagnosis not present

## 2014-09-30 DIAGNOSIS — Z992 Dependence on renal dialysis: Secondary | ICD-10-CM | POA: Diagnosis not present

## 2014-09-30 DIAGNOSIS — Z4781 Encounter for orthopedic aftercare following surgical amputation: Secondary | ICD-10-CM | POA: Diagnosis not present

## 2014-09-30 DIAGNOSIS — L03113 Cellulitis of right upper limb: Secondary | ICD-10-CM | POA: Diagnosis not present

## 2014-09-30 LAB — COMPREHENSIVE METABOLIC PANEL
ALBUMIN: 2 g/dL — AB (ref 3.5–5.0)
ALK PHOS: 115 U/L (ref 38–126)
ALT: 20 U/L (ref 17–63)
AST: 51 U/L — ABNORMAL HIGH (ref 15–41)
Anion gap: 12 (ref 5–15)
BUN: 26 mg/dL — AB (ref 6–20)
CHLORIDE: 98 mmol/L — AB (ref 101–111)
CO2: 26 mmol/L (ref 22–32)
CREATININE: 5.31 mg/dL — AB (ref 0.61–1.24)
Calcium: 7.1 mg/dL — ABNORMAL LOW (ref 8.9–10.3)
GFR calc Af Amer: 12 mL/min — ABNORMAL LOW (ref >60)
GFR, EST NON AFRICAN AMERICAN: 10 mL/min — AB (ref >60)
GLUCOSE: 77 mg/dL (ref 65–99)
Potassium: 3.6 mmol/L (ref 3.5–5.1)
SODIUM: 136 mmol/L (ref 135–145)
Total Bilirubin: 4 mg/dL — ABNORMAL HIGH (ref 0.3–1.2)
Total Protein: 5.9 g/dL — ABNORMAL LOW (ref 6.5–8.1)

## 2014-09-30 LAB — CBC
HCT: 30.3 % — ABNORMAL LOW (ref 39.0–52.0)
Hemoglobin: 10.6 g/dL — ABNORMAL LOW (ref 13.0–17.0)
MCH: 30.5 pg (ref 26.0–34.0)
MCHC: 35 g/dL (ref 30.0–36.0)
MCV: 87.1 fL (ref 78.0–100.0)
Platelets: 60 10*3/uL — ABNORMAL LOW (ref 150–400)
RBC: 3.48 MIL/uL — ABNORMAL LOW (ref 4.22–5.81)
RDW: 19.1 % — ABNORMAL HIGH (ref 11.5–15.5)
WBC: 6.7 10*3/uL (ref 4.0–10.5)

## 2014-09-30 LAB — HEMOGLOBIN A1C
Hgb A1c MFr Bld: 6.8 % — ABNORMAL HIGH (ref 4.8–5.6)
Mean Plasma Glucose: 148 mg/dL

## 2014-09-30 LAB — GLUCOSE, CAPILLARY
GLUCOSE-CAPILLARY: 94 mg/dL (ref 65–99)
GLUCOSE-CAPILLARY: 109 mg/dL — AB (ref 65–99)
Glucose-Capillary: 63 mg/dL — ABNORMAL LOW (ref 65–99)
Glucose-Capillary: 74 mg/dL (ref 65–99)
Glucose-Capillary: 68 mg/dL (ref 65–99)
Glucose-Capillary: 112 mg/dL — ABNORMAL HIGH (ref 65–99)
Glucose-Capillary: 84 mg/dL (ref 65–99)

## 2014-09-30 MED ORDER — NEPRO/CARBSTEADY PO LIQD: 237.0000 mL | ORAL | Status: AC | PRN

## 2014-09-30 MED ORDER — MIDODRINE HCL 10 MG PO TABS: 10.0000 mg | ORAL_TABLET | Freq: Three times a day (TID) | ORAL | Status: AC

## 2014-09-30 MED ORDER — ACETAMINOPHEN 325 MG PO TABS: 650.0000 mg | ORAL_TABLET | Freq: Four times a day (QID) | ORAL | Status: AC | PRN

## 2014-09-30 MED ORDER — MIDODRINE HCL 5 MG PO TABS
ORAL_TABLET | ORAL | Status: AC
  Filled 2014-09-30: qty 2

## 2014-09-30 MED ORDER — DEXTROSE 50 % IV SOLN
INTRAVENOUS | Status: AC
  Administered 2014-09-30: 25 mL
  Filled 2014-09-30: qty 50

## 2014-09-30 MED ORDER — SEVELAMER CARBONATE 800 MG PO TABS: 2400.0000 mg | ORAL_TABLET | Freq: Two times a day (BID) | ORAL | Status: AC

## 2014-09-30 MED ORDER — OXYCODONE-ACETAMINOPHEN 10-325 MG PO TABS: 1.0000 | ORAL_TABLET | Freq: Two times a day (BID) | ORAL | Status: AC | PRN

## 2014-09-30 MED ORDER — VANCOMYCIN HCL IN DEXTROSE 1-5 GM/200ML-% IV SOLN: 1000.0000 mg | INTRAVENOUS | Status: AC

## 2014-09-30 NOTE — Clinical Social Work Placement (Signed)
   CLINICAL SOCIAL WORK PLACEMENT  NOTE  Date:  09/30/2014  Patient Details  Name: Julian [name] MRN: [id number] Date of Birth: 1945-12-16  Clinical Social Work is seeking post-discharge placement for this patient at the [place] level of care (*CSW will initial, date and re-position this form in  chart as items are completed):  Yes   Patient/family provided with [facility] Work Department's list of facilities offering this level of care within the geographic area requested by the patient (or if unable, by the patient's family).  Yes   Patient/family informed of their freedom to choose among providers that offer the needed level of care, that participate in Medicare, Medicaid or managed care program needed by the patient, have an available bed and are willing to accept the patient.  Yes   Patient/family informed of [facility]'s ownership interest in [facility] and [facility], as well as of the fact that they are under no obligation to receive care at these facilities.  PASRR submitted to EDS on       PASRR number received on       Existing PASRR number confirmed on 09/27/14     FL2 transmitted to all facilities in geographic area requested by pt/family on 09/27/14     FL2 transmitted to all facilities within larger geographic area on 09/27/14     Patient informed that his/her managed care company has contracts with or will negotiate with certain facilities, including the following:        Yes   Patient/family informed of bed offers received.  Patient chooses bed at [facility]     Physician recommends and patient chooses bed at      Patient to be transferred to [facility] on 09/30/14.  Patient to be transferred to facility by Ambulance [facility])     Patient family notified on 09/30/14 of transfer.  Name of family member notified:  Sister- Blanton Kardell     PHYSICIAN Please sign FL2, Please prepare  priority discharge summary, including medications, Please prepare prescriptions     Additional Comment:  Patient provided bed choices in [facility] and he requested [facility]. He states he has been placed in the past at [facility] and is aware of the placement process.  He wants to sign himself into the facility.  Per patient's request- notified his sister [name] of his d/c and message left for his home caregiver Doroteo Bradford of d/c today. Nursing notified to call report.  No further CSW needs identified.  CSW signing off.     _______________________________________________ Williemae Area, LCSW 09/30/2014, 3:47 PM

## 2014-09-30 NOTE — Discharge Instructions (Signed)
Dressing change to right arm stump in 2-3 days. Please call Dr. Burney Gauze for further instructions.

## 2014-09-30 NOTE — Progress Notes (Signed)
Pt prepared for d/c to SNF. IV d/c'd. Skin intact except as most recently charted. Vitals are stable. Report called to [facility] (receiving facility). Pt to be transported by ambulance service.  Jillyn Ledger, MBA, BS, RN

## 2014-09-30 NOTE — Procedures (Signed)
I have seen and examined this patient and agree with the plan of care. Appears to be doing well and plan for discharge today.  Gerald Kuehl W 09/30/2014, 10:27 AM

## 2014-09-30 NOTE — Discharge Summary (Signed)
[name]  Physician Discharge Summary   Patient ID: Julian [name] MRN: [id number] DOB/AGE: May 08, 1946 69 y.o.  Admit date: 09/20/2014 Discharge date: 09/30/2014  PCP: Vic Blackbird, MD  DISCHARGE DIAGNOSES:  Principal Problem:   Sepsis due to cellulitis Active Problems:   End-stage renal disease on hemodialysis   Type 2 diabetes with complication   Obesity   S/P AKA (above knee amputation) bilateral   Cellulitis of right hand   Cellulitis of hand, right   Septic shock   Gas gangrene   Other specified hypotension   Peripheral vascular disease   Systolic and diastolic CHF, chronic   Chronic diarrhea   Anemia of chronic disease   Diabetes type 2, controlled   RECOMMENDATIONS FOR OUTPATIENT FOLLOW UP: 1. We will need to get vancomycin with dialysis, last dose on May 27 2. Wound care and dressing changes per Dr. Burney Gauze 3. Monitor blood sugars 3 times daily before meals. Did not initiate insulin coverage unless blood sugars stayed greater than 180 consistently.   DISCHARGE CONDITION: fair  Diet recommendation: Heart healthy  Filed Weights   09/29/14 2143 09/30/14 0420 09/30/14 0754  Weight: 105 kg (231 lb 7.7 oz) 104.9 kg (231 lb 4.2 oz) 108.6 kg (239 lb 6.7 oz)    INITIAL HISTORY: Julian [name] is a 69 y.o. male with a PMHx ESRD on HD M/W/F ,S/P right AKA/left AKA secondary to PVD, ischemic cardiomyopathy, complete heart block, DM type 2, obesitywho presented to ED on 5/12 with complaints of increased falls and concerns of possible CVA. 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 Pt returned to ED on 5/13 with continued falls. During work up, hand noted to be actively draining and purulent. Pt's hand surgeon, Dr. Burney Gauze contacted. Patient was admitted to the hospital. Underwent surgery and had to undergo amputation of the right hand.  Consultants: Dr.James Deterding  (nephrology) Dr.Douglas B McQuaid [facility] M)  Procedure/Significant Events: Echo 5/16 Study Conclusions - Left ventricle: The cavity size was normal. Wall thickness wasincreased in a pattern of mild LVH. Systolic function wasseverely reduced. The estimated ejection fraction was in therange of 25% to 30%. Diffuse hypokinesis. Features are consistentwith a pseudonormal left ventricular filling pattern, withconcomitant abnormal relaxation and increased filling pressure(grade 2 diastolic dysfunction). - Left atrium: The atrium was mildly dilated. - Right ventricle: The cavity size was moderately dilated. Systolicfunction was moderately reduced. - Right atrium: The atrium was mildly dilated. - Pulmonary arteries: PA peak pressure: 31 mm Hg (S).  R hand surgery debridement 5/13  OR debridement 5/15  Back on Neosynephrine late 5/15 R hand amputation 5/18, Bradycardiac overnight 5/18, dopamine added Stopped neo 5/19, BP ran low on cuff measurements but no change in mental status  Culture BCx2 5/13 >> neg UC 5/13 >> neg R hand wound 5/13 >> w/ gpc clusters and chains>>> group b strep Hand wound 5/15 > vanc/amp susceptible enterococcus  Antibiotics: Vanco 5/13 >> 5/18 Zosyn 5/13 >> 5/18  Ampicillin 5/18-5/22 Changed back to Vanc for ease of administration till 5/27  LINES / TUBES:  he has a central line which will be removed prior to [place]:   Septic shock cause by Rt hand gas gangrene/enterococcus Patient was admitted to the hospital. He was started on broad-spectrum antibiotics. He was seen by Dr. Karie Soda with the orthopedics/hand surgery. He initially when the debridement for the patient's sepsis persisted. Subsequently, he  underwent amputation of his right hand. He required pressors briefly. Has been stable for the last 3-4 days. Has tolerated dialysis without any difficulty. His wound cultures grew enterococcus sensitive to vancomycin and ampicillin. He was  initially changed over to ampicillin. Plan is for 14 days of treatment with last dose to be given weekly. 7. Since he is on dialysis. It'll be easier to administer vancomycin with his dialysis. We will switch back to vancomycin.   Persistent hypotension  Cuff pressures are low but mental status at baseline. Patient has been stable for the last many days. Would discontinue his labetalol.  Severe PVD Resume patient's antiplatelet agents.  CAD Stable  Chronic diastolic and systolic CHF with EF of 09-73% Patient is well compensated. His volume status being addressed through dialysis. Patient's hypotension prevents typical CHF medication use.   HTN/Asymptomatic hypotension Continue midodrine 10 mg TID  ESRD on HD M/W/F HD per nephrology. Will resume outpatient dialysis starting Wednesday.  Chronic diarrhea Continue bentyl and lomotil  Anemia of chronic disease Hemoglobin has been stable.   DMtype 2  Not noted to be on any medications for same at home. Patient has had a few episodes of hypoGlycemia in the hospital. We'll stop all of his sliding scale insulin coverage. His last HbA1c that we have is 6.6 in February. Recommend checking his blood glucose 3 times a day before meals, but not covering him with any sliding scale insulin coverage unless blood glucose level is consistently greater than 180.   Patient remains stable. We will need to go to skilled nursing facility. Social worker is on board. Hopeful for bed availability today. Okay for discharge.  PERTINENT LABS:  The results of significant diagnostics from this hospitalization (including imaging, microbiology, ancillary and laboratory) are listed below for reference.    Microbiology: Recent Results (from the past 240 hour(s))  Anaerobic culture     Status: None   Collection Time: 09/20/14  2:46 PM  Result Value Ref Range Status   Specimen Description ABSCESS  Final   Special Requests RIGHT HAND PT ON VACOMYCIN ZOSYN   Final   Gram Stain   Final    FEW WBC PRESENT, PREDOMINANTLY MONONUCLEAR NO SQUAMOUS EPITHELIAL CELLS SEEN ABUNDANT GRAM POSITIVE COCCI IN PAIRS IN CLUSTERS ABUNDANT GRAM NEGATIVE RODS Performed at [facility]    Culture   Final    NO ANAEROBES ISOLATED Performed at [facility]    Report Status 09/25/2014 FINAL  Final  Culture, routine-abscess     Status: None   Collection Time: 09/20/14  2:46 PM  Result Value Ref Range Status   Specimen Description ABSCESS  Final   Special Requests RIGHT HAND PT ON VANCOMYCIN ZOSYN  Final   Gram Stain   Final    FEW WBC PRESENT, PREDOMINANTLY MONONUCLEAR NO SQUAMOUS EPITHELIAL CELLS SEEN ABUNDANT GRAM POSITIVE COCCI IN PAIRS IN CLUSTERS ABUNDANT GRAM NEGATIVE RODS Performed at [facility]    Culture   Final    MODERATE GROUP B STREP(S.AGALACTIAE)ISOLATED Note: TESTING AGAINST S. AGALACTIAE NOT ROUTINELY PERFORMED DUE TO PREDICTABILITY OF AMP/PEN/VAN SUSCEPTIBILITY. Performed at [facility]    Report Status 09/23/2014 FINAL  Final  MRSA PCR Screening     Status: Abnormal   Collection Time: 09/20/14  6:45 PM  Result Value Ref Range Status   MRSA by PCR POSITIVE (A) NEGATIVE Final    Comment:        The GeneXpert MRSA Assay (FDA approved for NASAL specimens only),  is one component of a comprehensive MRSA colonization surveillance program. It is not intended to diagnose MRSA infection nor to guide or monitor treatment for MRSA infections. RESULT CALLED TO, READ BACK BY AND VERIFIED WITH: T WOODS RN 2156 09/20/14 A BROWNING   Anaerobic culture     Status: None   Collection Time: 09/22/14 10:57 AM  Result Value Ref Range Status   Specimen Description ABSCESS  Final   Special Requests RIGHT HAND PT ON VANCOMYCIN ZOSYN  Final   Gram Stain   Final    NO WBC SEEN NO SQUAMOUS EPITHELIAL CELLS SEEN FEW GRAM POSITIVE COCCI IN PAIRS Performed at [facility]    Culture   Final    NO ANAEROBES  ISOLATED Performed at [facility]    Report Status 09/27/2014 FINAL  Final  Culture, routine-abscess     Status: None   Collection Time: 09/22/14 10:57 AM  Result Value Ref Range Status   Specimen Description ABSCESS  Final   Special Requests RIGHT HAND PT ON VANCOMYCIN ZOSYN  Final   Gram Stain   Final    NO WBC SEEN NO SQUAMOUS EPITHELIAL CELLS SEEN FEW GRAM POSITIVE COCCI IN PAIRS Performed at [facility]    Culture   Final    MODERATE ENTEROCOCCUS SPECIES Performed at [facility]    Report Status 09/25/2014 FINAL  Final   Organism ID, Bacteria ENTEROCOCCUS SPECIES  Final      Susceptibility   Enterococcus species - MIC*    VANCOMYCIN 1 SENSITIVE Sensitive     AMPICILLIN <=2 SENSITIVE Sensitive     * MODERATE ENTEROCOCCUS SPECIES     Labs: Basic Metabolic Panel:  Recent Labs Lab 09/24/14 0505 09/24/14 1000 09/26/14 0532 09/27/14 0447 09/28/14 0428 09/29/14 0651 09/30/14 0830  NA 133* 132* 134* 134* 135 132* 136  K 4.3 4.4 4.2 4.3 3.8 3.9 3.6  CL 95* 95* 95* 97* 96* 95* 98*  CO2 '24 24 22 25 26 24 26  ' GLUCOSE 191* 214* 95 108* 85 91 77  BUN 35* 36* 24* 32* 21* 27* 26*  CREATININE 6.02* 6.22* 4.94* 5.94* 4.38* 5.66* 5.31*  CALCIUM 8.1* 8.3* 9.0 8.8* 8.1* 7.6* 7.1*  MG  --   --   --   --   --  1.8  --   PHOS 5.0* 5.1* 5.5* 6.3* 4.4  --   --    Liver Function Tests:  Recent Labs Lab 09/26/14 0532 09/27/14 0447 09/28/14 0428 09/29/14 0651 09/30/14 0830  AST 49*  --   --  65* 51*  ALT 16*  --   --  24 20  ALKPHOS 136*  --   --  139* 115  BILITOT 3.7*  --   --  4.9* 4.0*  PROT 6.7  --   --  6.8 5.9*  ALBUMIN 2.4* 2.0* 2.0* 2.1* 2.0*   CBC:  Recent Labs Lab 09/24/14 1000 09/25/14 0247 09/26/14 0532 09/27/14 0447 09/29/14 0651  WBC 10.3 8.8 8.6 7.2 6.5  NEUTROABS  --   --  6.1  --  4.5  HGB 11.6* 11.6* 11.9* 10.4* 11.1*  HCT 34.4* 34.0* 35.8* 30.7* 32.6*  MCV 89.1 88.5 89.1 88.7 88.3  PLT 81* 82* 79* 75* 63*    CBG:  Recent Labs Lab 09/29/14 2135 09/29/14 2230 09/30/14 0052 09/30/14 0120 09/30/14 0737  GLUCAP 61* 76 63* 94 74     IMAGING STUDIES Dg Chest 1 View  09/19/2014  CLINICAL DATA:  Patient fell out of wheelchair this morning. Altered level of consciousness.  EXAM: CHEST  1 VIEW  COMPARISON:  02/18/2014  FINDINGS: Shallow inspiration. Cardiac enlargement. Pulmonary vascularity is normal. No focal airspace disease or consolidation in the lungs. No blunting of costophrenic angles. No pneumothorax. Calcified and tortuous aorta. Mediastinal contours appear intact. Degenerative changes in the shoulders. Vascular graft in the left arm.  IMPRESSION: Cardiac enlargement.  No evidence of active pulmonary disease.   Electronically Signed   By: Lucienne Capers M.D.   On: 09/19/2014 05:31   Dg Lumbar Spine Complete  09/20/2014   CLINICAL DATA:  Fall.  Low back pain.  Right hand swelling.  EXAM: LUMBAR SPINE - COMPLETE 4+ VIEW  COMPARISON:  Lumbar spine radiographs 02/24/2013  FINDINGS: 5 non rib-bearing lumbar type vertebral bodies are present. Moderate osteopenia is noted. Vertebral body heights and alignment are maintained. Extensive atherosclerotic calcifications are present in the aorta and branch vessels without aneurysm. The bowel gas pattern is unremarkable. The pelvis is intact.  IMPRESSION: 1. No acute or focal abnormality of the lumbar spine. 2. Extensive atherosclerotic disease without evidence for aneurysm.   Electronically Signed   By: San Morelle M.D.   On: 09/20/2014 07:44   Dg Pelvis 1-2 Views  09/19/2014   CLINICAL DATA:  Patient fell out of wheelchair. Altered level of consciousness.  EXAM: PELVIS - 1-2 VIEW  COMPARISON:  None.  FINDINGS: Diffuse bone demineralization. Extensive vascular calcifications. Pelvis and hips appear intact. No acute displaced fractures are identified. SI joints and symphysis pubis are not displaced.  IMPRESSION: No acute bony abnormalities.    Electronically Signed   By: Lucienne Capers M.D.   On: 09/19/2014 05:33   Ct Head Wo Contrast  09/20/2014   CLINICAL DATA:  Fall earlier today  EXAM: CT HEAD WITHOUT CONTRAST  TECHNIQUE: Contiguous axial images were obtained from the base of the skull through the vertex without intravenous contrast.  COMPARISON:  Sep 19, 2014  FINDINGS: Mild generalized atrophy is stable. There is slightly more cerebellar atrophy than elsewhere. There is no intracranial mass, hemorrhage, extra-axial fluid collection, or midline shift. There is evidence of a prior infarct in the medial right occipital lobe, stable. There is slight periventricular small vessel disease. No new gray-white compartment lesion is identified. No acute infarct evident. Basal ganglia calcification may be physiologic in this age group. The bony calvarium appears intact. The mastoid air cells are clear. There is mucosal thickening in both maxillary antra with a retention cyst in the inferior left maxillary antrum. There is mucosal thickening of multiple ethmoid air cells. Mild mucosal thickening in both posterior sphenoid sinuses also noted.  IMPRESSION: Atrophy. Prior infarct medial right occipital lobe. Slight periventricular small vessel disease. No acute infarct apparent. No intracranial hemorrhage, mass, or extra-axial fluid. Paranasal sinus disease.   Electronically Signed   By: Lowella Grip III M.D.   On: 09/20/2014 07:18   Ct Head Wo Contrast  09/19/2014   CLINICAL DATA:  Initial evaluation for acute trauma, fall.  EXAM: CT HEAD WITHOUT CONTRAST  TECHNIQUE: Contiguous axial images were obtained from the base of the skull through the vertex without intravenous contrast.  COMPARISON:  Prior study from 01/21/2012  FINDINGS: Diffuse prominence of the CSF containing spaces is compatible with generalized cerebral atrophy. Encephalomalacia within the right occipital lobe consistent with remote right PCA territory infarct, stable. Chronic small  vessel ischemic changes noted. Prominent vascular calcifications noted.  No acute large  vessel territory infarct. No intracranial hemorrhage. No extra-axial fluid collection.  No mass lesion or midline shift.  No hydrocephalus.  Scalp soft tissues within normal limits. No acute abnormality about the orbits.  Calvarium intact.  Probable retention cyst present within the left maxillary sinus. Scattered mucosal thickening present throughout the visualized paranasal sinuses. No air-fluid levels to suggest active sinus infection. Mastoid air cells are clear.  IMPRESSION: 1. No acute intracranial process. 2. Remote right PCA territory infarct. 3. Generalized cerebral atrophy with chronic microvascular ischemic disease.   Electronically Signed   By: Jeannine Boga M.D.   On: 09/19/2014 05:24   Dg Chest Port 1 View  09/23/2014   CLINICAL DATA:  69 year old male with a history of central line placement.  EXAM: PORTABLE CHEST - 1 VIEW  COMPARISON:  09/19/2014, 02/18/2014  FINDINGS: Cardiomediastinal silhouette unchanged in size and contour.  Right rotation of the patient.  Low lung volumes accentuates interstitial markings. No pneumothorax.  Interval placement of left IJ approach central venous catheter, which appears to terminate at the confluence of the superior vena cava and the brachiocephalic vein.  Left upper extremity vascular stent, partially imaged.  IMPRESSION: Interval placement of a left IJ approach central venous catheter, appearing to terminate at the confluence of the brachiocephalic vein and the SVC.  No pneumothorax.  Bilateral atelectasis and/ consolidation.  Signed,  Dulcy Fanny. Earleen Newport, DO  [facility]  [facility]   Electronically Signed   By: Corrie Mckusick D.O.   On: 09/23/2014 14:33   Dg Hand Complete Right  09/20/2014   CLINICAL DATA:  Fall. Swelling of the right hand. Amputation of the fifth digit 2 months ago.  EXAM: Three views of the right  hand.  COMPARISON:  Right hand radiographs 07/07/2014  FINDINGS: Amputation is noted at the proximal aspect of the fifth metacarpal. There is marked soft tissue swelling with gas in the long finger and ring finger. Extensive dorsal soft tissue swelling is noted over the hand diffuse subcutaneous gas centered over the first and second metacarpals. No definite bone erosion is evident. There is moderate osteopenia. Extensive small vessel calcifications are noted.  IMPRESSION: 1. Extensive soft tissue swelling with gas over the dorsal aspect of the hand extending into the digits compatible with cellulitis. This does not appear to be related to the acute event. 2. Extensive soft tissue swelling extends into all 4 residual digits. 3. Amputation of the fifth ray at the level of the metacarpal. 4. Diffuse microvascular calcifications compatible with diabetes.   Electronically Signed   By: San Morelle M.D.   On: 09/20/2014 07:51   Dg Hip Unilat With Pelvis 1v Left  09/20/2014   CLINICAL DATA:  Fall. Left hip pain. The patient fell at home trying to get to the commode.  EXAM: LEFT HIP (WITH PELVIS) 1 VIEW  COMPARISON:  None.  FINDINGS: Moderate osteopenia is present. The left hip is located. No acute fracture or dislocation is evident. Extensive atherosclerotic calcifications are present. The pelvis is intact.  IMPRESSION: 1. No acute fracture of the left hip. 2. Moderate osteopenia. 3. Extensive atherosclerotic disease is again noted.   Electronically Signed   By: San Morelle M.D.   On: 09/20/2014 07:46    DISCHARGE EXAMINATION: Filed Vitals:   09/30/14 0830 09/30/14 0930 09/30/14 1000 09/30/14 1030  BP: 122/47 119/56 96/75 110/45  Pulse: 69 70 73 75  Temp:      TempSrc:      Resp:  '12 13 13 13  ' Height:      Weight:      SpO2:       General appearance: alert, cooperative, appears stated age and no distress Resp: clear to auscultation bilaterally Cardio: regular rate and rhythm, S1, S2  normal, no murmur, click, rub or gallop GI: soft, non-tender; bowel sounds normal; no masses,  no organomegaly  DISPOSITION: Skilled Nursing facility  Discharge Instructions    Call MD for:  difficulty breathing, headache or visual disturbances    Complete by:  As directed      Call MD for:  extreme fatigue    Complete by:  As directed      Call MD for:  persistant dizziness or light-headedness    Complete by:  As directed      Call MD for:  persistant nausea and vomiting    Complete by:  As directed      Call MD for:  severe uncontrolled pain    Complete by:  As directed      Call MD for:  temperature >100.4    Complete by:  As directed      Diet - low sodium heart healthy    Complete by:  As directed      Discharge instructions    Complete by:  As directed   You were cared for by a hospitalist during your hospital stay. If you have any questions about your discharge medications or the care you received while you were in the hospital after you are discharged, you can call the unit and asked to speak with the hospitalist on call if the hospitalist that took care of you is not available. Once you are discharged, your primary care physician will handle any further medical issues. Please note that NO REFILLS for any discharge medications will be authorized once you are discharged, as it is imperative that you return to your primary care physician (or establish a relationship with a primary care physician if you do not have one) for your aftercare needs so that they can reassess your need for medications and monitor your lab values. If you do not have a primary care physician, you can call [phone] for a physician referral.     Increase activity slowly    Complete by:  As directed            ALLERGIES: No Known Allergies   Current Discharge Medication List    START taking these medications   Details  acetaminophen (TYLENOL) 325 MG tablet Take 2 tablets (650 mg total) by mouth every 6  (six) hours as needed for mild pain (or Fever >/= 101).    Nutritional Supplements (FEEDING SUPPLEMENT, NEPRO CARB STEADY,) LIQD Take 237 mLs by mouth as needed (missed meal during dialysis.). Refills: 0    vancomycin (VANCOCIN) 1 GM/200ML SOLN Inject 200 mLs (1,000 mg total) into the vein every Monday, Wednesday, and Friday with hemodialysis. LAST DOSE ON 10/04/14. Qty: 4000 mL      CONTINUE these medications which have CHANGED   Details  midodrine (PROAMATINE) 10 MG tablet Take 1 tablet (10 mg total) by mouth 3 (three) times daily with meals.    oxyCODONE-acetaminophen (PERCOCET) 10-325 MG per tablet Take 1 tablet by mouth 2 (two) times daily as needed for pain. Qty: 30 tablet, Refills: 0    sevelamer carbonate (RENVELA) 800 MG tablet Take 3 tablets (2,400 mg total) by mouth 2 (two) times daily with a meal. Ta Qty: 480 tablet,  Refills: 3      CONTINUE these medications which have NOT CHANGED   Details  aspirin EC 81 MG EC tablet Take 1 tablet (81 mg total) by mouth daily.    atorvastatin (LIPITOR) 80 MG tablet Take 1 tablet (80 mg total) by mouth every evening. Qty: 90 tablet, Refills: 3    cinacalcet (SENSIPAR) 90 MG tablet Take 1 tablet (90 mg total) by mouth daily. Qty: 30 tablet, Refills: 3    dicyclomine (BENTYL) 10 MG capsule Take 1 capsule (10 mg total) by mouth 4 (four) times daily -  before meals and at bedtime. Qty: 120 capsule, Refills: 3    diphenoxylate-atropine (LOMOTIL) 2.5-0.025 MG per tablet TAKE ONE TABLET BY MOUTH 4 TIMES DAILY AS NEEDED FOR LOOSE STOOLS. Qty: 30 tablet, Refills: 0    meclizine (ANTIVERT) 25 MG tablet Take 1 tablet (25 mg total) by mouth 3 (three) times daily as needed for dizziness. Qty: 30 tablet, Refills: 3    methocarbamol (ROBAXIN) 500 MG tablet Take 500 mg by mouth every 8 (eight) hours as needed for muscle spasms.     multivitamin (RENA-VIT) TABS tablet Take 1 tablet by mouth at bedtime. Rena-Vit. Qty: 30 tablet, Refills: 0      nitroGLYCERIN (NITROSTAT) 0.4 MG SL tablet Place 1 tablet (0.4 mg total) under the tongue every 5 (five) minutes as needed for chest pain (up to 3 doses). Qty: 25 tablet, Refills: 3    ticagrelor (BRILINTA) 90 MG TABS tablet Take 1 tablet (90 mg total) by mouth 2 (two) times daily. Qty: 60 tablet, Refills: 3      STOP taking these medications     labetalol (NORMODYNE) 200 MG tablet      blood glucose meter kit and supplies KIT      Blood Glucose Monitoring Suppl (BLOOD GLUCOSE METER KIT AND SUPPLIES)        Follow-up Information    Follow up with Vic Blackbird, MD. Schedule an appointment as soon as possible for a visit in 1 week.   Specialty:  Family Medicine   Why:  post hospitalization follow up   Contact information:   [address] [place] [address] [phone]       Follow up with Charlotte Crumb A, MD. Schedule an appointment as soon as possible for a visit in 1 week.   Specialty:  Orthopedic Surgery   Why:  surgical follow up.   Contact information:   [place] [address] [phone]       TOTAL DISCHARGE TIME: 35 mins  [facility] Pager [phone]  09/30/2014, 11:41 AM

## 2014-09-30 NOTE — Care Management Note (Signed)
Case Management Note  Patient Details  Name: Julian [name] MRN: [id number] Date of Birth: May 14, 1945  Subjective/Objective:           CM following for progression and d/c planning         Action/Plan: CSW working with this pt and facilitating d/c to SNF for rehab.  Expected Discharge Date:          09/30/2014        Expected Discharge Plan:  Skilled Nursing Facility  In-House Referral:  Clinical Social Work  Discharge planning Services  CM Consult  Post Acute Care Choice:  Resumption of Svcs/PTA Provider Choice offered to:     DME Arranged:    DME Agency:     HH Arranged:  CAPS Program [place] Agency:     Status of Service:  Completed, signed off  Medicare Important Message Given:  Yes Date Medicare IM Given:  09/23/14 Medicare IM give by:  debbie dowell rn,bsn Date Additional Medicare IM Given:  09/26/14 Additional Medicare Important Message give by:  Luz Lex, RNBSN  Additions IM given 09/30/2014 by Adron Bene RN MPH, case manager If discussed at [place] of Stay Meetings, dates discussed:    Additional Comments:  Adron Bene, RN 09/30/2014, 4:10 PM

## 2014-09-30 NOTE — Progress Notes (Signed)
Hypoglycemic Event  CBG: 63  Treatment: D50 IV 25 mL  Symptoms: Sweaty  Follow-up CBG: [id number] CBG Result:94  Possible Reasons for Event: Unknown    Julian [name], [name]  Remember to initiate Hypoglycemia Order Set & complete

## 2014-10-01 LAB — SEROTONIN RELEASE ASSAY (SRA)
SRA 100IU/mL UFH Ser-aCnc: 1 % (ref 0–20)
SRA, LOW DOSE HEPARIN: 1 % (ref 0–20)

## 2014-10-03 DIAGNOSIS — N186 End stage renal disease: Secondary | ICD-10-CM | POA: Diagnosis not present

## 2014-10-03 DIAGNOSIS — D509 Iron deficiency anemia, unspecified: Secondary | ICD-10-CM | POA: Diagnosis not present

## 2014-10-03 DIAGNOSIS — Z992 Dependence on renal dialysis: Secondary | ICD-10-CM | POA: Diagnosis not present

## 2014-10-03 LAB — GLUCOSE, CAPILLARY: Glucose-Capillary: 25 mg/dL — CL (ref 65–99)

## 2014-10-05 DIAGNOSIS — Z992 Dependence on renal dialysis: Secondary | ICD-10-CM | POA: Diagnosis not present

## 2014-10-05 DIAGNOSIS — N186 End stage renal disease: Secondary | ICD-10-CM | POA: Diagnosis not present

## 2014-10-05 DIAGNOSIS — D509 Iron deficiency anemia, unspecified: Secondary | ICD-10-CM | POA: Diagnosis not present

## 2014-10-08 DIAGNOSIS — D509 Iron deficiency anemia, unspecified: Secondary | ICD-10-CM | POA: Diagnosis not present

## 2014-10-08 DIAGNOSIS — N186 End stage renal disease: Secondary | ICD-10-CM | POA: Diagnosis not present

## 2014-10-08 DIAGNOSIS — Z992 Dependence on renal dialysis: Secondary | ICD-10-CM | POA: Diagnosis not present

## 2014-10-11 DIAGNOSIS — N186 End stage renal disease: Secondary | ICD-10-CM | POA: Diagnosis not present

## 2014-10-11 DIAGNOSIS — N2581 Secondary hyperparathyroidism of renal origin: Secondary | ICD-10-CM | POA: Diagnosis not present

## 2014-10-11 DIAGNOSIS — Z992 Dependence on renal dialysis: Secondary | ICD-10-CM | POA: Diagnosis not present

## 2014-10-11 DIAGNOSIS — D631 Anemia in chronic kidney disease: Secondary | ICD-10-CM | POA: Diagnosis not present

## 2014-10-12 DIAGNOSIS — D631 Anemia in chronic kidney disease: Secondary | ICD-10-CM | POA: Diagnosis not present

## 2014-10-12 DIAGNOSIS — N186 End stage renal disease: Secondary | ICD-10-CM | POA: Diagnosis not present

## 2014-10-12 DIAGNOSIS — N2581 Secondary hyperparathyroidism of renal origin: Secondary | ICD-10-CM | POA: Diagnosis not present

## 2014-10-12 DIAGNOSIS — Z992 Dependence on renal dialysis: Secondary | ICD-10-CM | POA: Diagnosis not present

## 2014-10-15 DIAGNOSIS — N186 End stage renal disease: Secondary | ICD-10-CM | POA: Diagnosis not present

## 2014-10-15 DIAGNOSIS — N2581 Secondary hyperparathyroidism of renal origin: Secondary | ICD-10-CM | POA: Diagnosis not present

## 2014-10-15 DIAGNOSIS — D631 Anemia in chronic kidney disease: Secondary | ICD-10-CM | POA: Diagnosis not present

## 2014-10-15 DIAGNOSIS — Z992 Dependence on renal dialysis: Secondary | ICD-10-CM | POA: Diagnosis not present

## 2014-10-18 DIAGNOSIS — N186 End stage renal disease: Secondary | ICD-10-CM | POA: Diagnosis not present

## 2014-10-18 DIAGNOSIS — D631 Anemia in chronic kidney disease: Secondary | ICD-10-CM | POA: Diagnosis not present

## 2014-10-18 DIAGNOSIS — N2581 Secondary hyperparathyroidism of renal origin: Secondary | ICD-10-CM | POA: Diagnosis not present

## 2014-10-18 DIAGNOSIS — Z992 Dependence on renal dialysis: Secondary | ICD-10-CM | POA: Diagnosis not present

## 2014-10-19 DIAGNOSIS — D631 Anemia in chronic kidney disease: Secondary | ICD-10-CM | POA: Diagnosis not present

## 2014-10-19 DIAGNOSIS — N2581 Secondary hyperparathyroidism of renal origin: Secondary | ICD-10-CM | POA: Diagnosis not present

## 2014-10-19 DIAGNOSIS — N186 End stage renal disease: Secondary | ICD-10-CM | POA: Diagnosis not present

## 2014-10-19 DIAGNOSIS — Z992 Dependence on renal dialysis: Secondary | ICD-10-CM | POA: Diagnosis not present

## 2014-10-22 DIAGNOSIS — Z992 Dependence on renal dialysis: Secondary | ICD-10-CM | POA: Diagnosis not present

## 2014-10-22 DIAGNOSIS — N186 End stage renal disease: Secondary | ICD-10-CM | POA: Diagnosis not present

## 2014-10-22 DIAGNOSIS — N2581 Secondary hyperparathyroidism of renal origin: Secondary | ICD-10-CM | POA: Diagnosis not present

## 2014-10-22 DIAGNOSIS — D631 Anemia in chronic kidney disease: Secondary | ICD-10-CM | POA: Diagnosis not present

## 2014-10-24 DIAGNOSIS — N2581 Secondary hyperparathyroidism of renal origin: Secondary | ICD-10-CM | POA: Diagnosis not present

## 2014-10-24 DIAGNOSIS — N186 End stage renal disease: Secondary | ICD-10-CM | POA: Diagnosis not present

## 2014-10-24 DIAGNOSIS — D631 Anemia in chronic kidney disease: Secondary | ICD-10-CM | POA: Diagnosis not present

## 2014-10-24 DIAGNOSIS — Z992 Dependence on renal dialysis: Secondary | ICD-10-CM | POA: Diagnosis not present

## 2014-10-26 DIAGNOSIS — Z992 Dependence on renal dialysis: Secondary | ICD-10-CM | POA: Diagnosis not present

## 2014-10-26 DIAGNOSIS — N186 End stage renal disease: Secondary | ICD-10-CM | POA: Diagnosis not present

## 2014-10-26 DIAGNOSIS — N2581 Secondary hyperparathyroidism of renal origin: Secondary | ICD-10-CM | POA: Diagnosis not present

## 2014-10-26 DIAGNOSIS — D631 Anemia in chronic kidney disease: Secondary | ICD-10-CM | POA: Diagnosis not present

## 2014-10-29 DIAGNOSIS — Z992 Dependence on renal dialysis: Secondary | ICD-10-CM | POA: Diagnosis not present

## 2014-10-29 DIAGNOSIS — N2581 Secondary hyperparathyroidism of renal origin: Secondary | ICD-10-CM | POA: Diagnosis not present

## 2014-10-29 DIAGNOSIS — N186 End stage renal disease: Secondary | ICD-10-CM | POA: Diagnosis not present

## 2014-10-29 DIAGNOSIS — D631 Anemia in chronic kidney disease: Secondary | ICD-10-CM | POA: Diagnosis not present

## 2014-10-31 DIAGNOSIS — D631 Anemia in chronic kidney disease: Secondary | ICD-10-CM | POA: Diagnosis not present

## 2014-10-31 DIAGNOSIS — Z992 Dependence on renal dialysis: Secondary | ICD-10-CM | POA: Diagnosis not present

## 2014-10-31 DIAGNOSIS — N186 End stage renal disease: Secondary | ICD-10-CM | POA: Diagnosis not present

## 2014-10-31 DIAGNOSIS — N2581 Secondary hyperparathyroidism of renal origin: Secondary | ICD-10-CM | POA: Diagnosis not present

## 2014-11-02 DIAGNOSIS — D631 Anemia in chronic kidney disease: Secondary | ICD-10-CM | POA: Diagnosis not present

## 2014-11-02 DIAGNOSIS — N2581 Secondary hyperparathyroidism of renal origin: Secondary | ICD-10-CM | POA: Diagnosis not present

## 2014-11-02 DIAGNOSIS — N186 End stage renal disease: Secondary | ICD-10-CM | POA: Diagnosis not present

## 2014-11-02 DIAGNOSIS — Z992 Dependence on renal dialysis: Secondary | ICD-10-CM | POA: Diagnosis not present

## 2014-11-05 DIAGNOSIS — N2581 Secondary hyperparathyroidism of renal origin: Secondary | ICD-10-CM | POA: Diagnosis not present

## 2014-11-05 DIAGNOSIS — Z992 Dependence on renal dialysis: Secondary | ICD-10-CM | POA: Diagnosis not present

## 2014-11-05 DIAGNOSIS — D631 Anemia in chronic kidney disease: Secondary | ICD-10-CM | POA: Diagnosis not present

## 2014-11-05 DIAGNOSIS — N186 End stage renal disease: Secondary | ICD-10-CM | POA: Diagnosis not present

## 2014-11-06 ENCOUNTER — Telehealth: Payer: Self-pay | Admitting: Family Medicine

## 2014-11-06 NOTE — Telephone Encounter (Signed)
[phone] Pt called and was wanting to speak to someone about what he would have to do to get another power wheel chair (there was a lot of background noise and could not hear pt that great)

## 2014-11-06 NOTE — Telephone Encounter (Signed)
Appointment cancelled.   Call placed to patient. [place].

## 2014-11-06 NOTE — Telephone Encounter (Signed)
Patient returned call.   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 States that he would like new chair to accommodate.   Advised that face to face required by insurance.   Appointment scheduled. [facility] made aware of upcoming appointment.

## 2014-11-06 NOTE — Telephone Encounter (Signed)
Call placed to patient. LMTRC.  

## 2014-11-06 NOTE — Telephone Encounter (Signed)
Pt is in a SNF, they can fill out paperwork there- either [facility] or [facility], you can cancel appt

## 2014-11-07 DIAGNOSIS — D631 Anemia in chronic kidney disease: Secondary | ICD-10-CM | POA: Diagnosis not present

## 2014-11-07 DIAGNOSIS — N186 End stage renal disease: Secondary | ICD-10-CM | POA: Diagnosis not present

## 2014-11-07 DIAGNOSIS — Z992 Dependence on renal dialysis: Secondary | ICD-10-CM | POA: Diagnosis not present

## 2014-11-07 DIAGNOSIS — N2581 Secondary hyperparathyroidism of renal origin: Secondary | ICD-10-CM | POA: Diagnosis not present

## 2014-11-07 NOTE — Telephone Encounter (Signed)
Call placed to patient.   Patient is currently residing in [facility] and [facility].   He is receiving PT/OT/ST services. Advised to discuss need with PT.

## 2014-11-09 DIAGNOSIS — D631 Anemia in chronic kidney disease: Secondary | ICD-10-CM | POA: Diagnosis not present

## 2014-11-09 DIAGNOSIS — Z992 Dependence on renal dialysis: Secondary | ICD-10-CM | POA: Diagnosis not present

## 2014-11-09 DIAGNOSIS — N2581 Secondary hyperparathyroidism of renal origin: Secondary | ICD-10-CM | POA: Diagnosis not present

## 2014-11-09 DIAGNOSIS — N186 End stage renal disease: Secondary | ICD-10-CM | POA: Diagnosis not present

## 2014-11-12 DIAGNOSIS — Z992 Dependence on renal dialysis: Secondary | ICD-10-CM | POA: Diagnosis not present

## 2014-11-12 DIAGNOSIS — N186 End stage renal disease: Secondary | ICD-10-CM | POA: Diagnosis not present

## 2014-11-12 DIAGNOSIS — D631 Anemia in chronic kidney disease: Secondary | ICD-10-CM | POA: Diagnosis not present

## 2014-11-12 DIAGNOSIS — N2581 Secondary hyperparathyroidism of renal origin: Secondary | ICD-10-CM | POA: Diagnosis not present

## 2014-11-13 ENCOUNTER — Ambulatory Visit: Payer: Self-pay | Admitting: Family Medicine

## 2014-11-14 DIAGNOSIS — D631 Anemia in chronic kidney disease: Secondary | ICD-10-CM | POA: Diagnosis not present

## 2014-11-14 DIAGNOSIS — Z992 Dependence on renal dialysis: Secondary | ICD-10-CM | POA: Diagnosis not present

## 2014-11-14 DIAGNOSIS — N2581 Secondary hyperparathyroidism of renal origin: Secondary | ICD-10-CM | POA: Diagnosis not present

## 2014-11-14 DIAGNOSIS — N186 End stage renal disease: Secondary | ICD-10-CM | POA: Diagnosis not present

## 2014-11-16 DIAGNOSIS — N186 End stage renal disease: Secondary | ICD-10-CM | POA: Diagnosis not present

## 2014-11-16 DIAGNOSIS — Z992 Dependence on renal dialysis: Secondary | ICD-10-CM | POA: Diagnosis not present

## 2014-11-16 DIAGNOSIS — D631 Anemia in chronic kidney disease: Secondary | ICD-10-CM | POA: Diagnosis not present

## 2014-11-16 DIAGNOSIS — N2581 Secondary hyperparathyroidism of renal origin: Secondary | ICD-10-CM | POA: Diagnosis not present

## 2014-11-18 DIAGNOSIS — I10 Essential (primary) hypertension: Secondary | ICD-10-CM | POA: Diagnosis not present

## 2014-11-18 DIAGNOSIS — N185 Chronic kidney disease, stage 5: Secondary | ICD-10-CM | POA: Diagnosis not present

## 2014-11-18 DIAGNOSIS — I739 Peripheral vascular disease, unspecified: Secondary | ICD-10-CM | POA: Diagnosis not present

## 2014-11-18 DIAGNOSIS — S58011A Complete traumatic amputation at elbow level, right arm, initial encounter: Secondary | ICD-10-CM | POA: Diagnosis not present

## 2014-11-19 ENCOUNTER — Telehealth: Payer: Self-pay | Admitting: Family Medicine

## 2014-11-19 DIAGNOSIS — R601 Generalized edema: Secondary | ICD-10-CM | POA: Diagnosis not present

## 2014-11-19 DIAGNOSIS — N19 Unspecified kidney failure: Secondary | ICD-10-CM | POA: Diagnosis not present

## 2014-11-19 DIAGNOSIS — I120 Hypertensive chronic kidney disease with stage 5 chronic kidney disease or end stage renal disease: Secondary | ICD-10-CM | POA: Diagnosis not present

## 2014-11-19 DIAGNOSIS — N2581 Secondary hyperparathyroidism of renal origin: Secondary | ICD-10-CM | POA: Diagnosis not present

## 2014-11-19 DIAGNOSIS — I8511 Secondary esophageal varices with bleeding: Secondary | ICD-10-CM | POA: Diagnosis not present

## 2014-11-19 DIAGNOSIS — M62838 Other muscle spasm: Secondary | ICD-10-CM | POA: Diagnosis not present

## 2014-11-19 DIAGNOSIS — K529 Noninfective gastroenteritis and colitis, unspecified: Secondary | ICD-10-CM | POA: Diagnosis not present

## 2014-11-19 DIAGNOSIS — L03119 Cellulitis of unspecified part of limb: Secondary | ICD-10-CM | POA: Diagnosis not present

## 2014-11-19 DIAGNOSIS — E119 Type 2 diabetes mellitus without complications: Secondary | ICD-10-CM | POA: Diagnosis not present

## 2014-11-19 DIAGNOSIS — I509 Heart failure, unspecified: Secondary | ICD-10-CM | POA: Diagnosis not present

## 2014-11-19 DIAGNOSIS — I2510 Atherosclerotic heart disease of native coronary artery without angina pectoris: Secondary | ICD-10-CM | POA: Diagnosis not present

## 2014-11-19 DIAGNOSIS — F339 Major depressive disorder, recurrent, unspecified: Secondary | ICD-10-CM | POA: Diagnosis not present

## 2014-11-19 DIAGNOSIS — Z992 Dependence on renal dialysis: Secondary | ICD-10-CM | POA: Diagnosis not present

## 2014-11-19 DIAGNOSIS — I10 Essential (primary) hypertension: Secondary | ICD-10-CM | POA: Diagnosis not present

## 2014-11-19 DIAGNOSIS — Z89611 Acquired absence of right leg above knee: Secondary | ICD-10-CM | POA: Diagnosis not present

## 2014-11-19 DIAGNOSIS — T8741 Infection of amputation stump, right upper extremity: Secondary | ICD-10-CM | POA: Diagnosis not present

## 2014-11-19 DIAGNOSIS — R0602 Shortness of breath: Secondary | ICD-10-CM | POA: Diagnosis not present

## 2014-11-19 DIAGNOSIS — K7460 Unspecified cirrhosis of liver: Secondary | ICD-10-CM | POA: Diagnosis not present

## 2014-11-19 DIAGNOSIS — E785 Hyperlipidemia, unspecified: Secondary | ICD-10-CM | POA: Diagnosis not present

## 2014-11-19 DIAGNOSIS — D631 Anemia in chronic kidney disease: Secondary | ICD-10-CM | POA: Diagnosis not present

## 2014-11-19 DIAGNOSIS — R05 Cough: Secondary | ICD-10-CM | POA: Diagnosis not present

## 2014-11-19 DIAGNOSIS — I739 Peripheral vascular disease, unspecified: Secondary | ICD-10-CM | POA: Diagnosis not present

## 2014-11-19 DIAGNOSIS — R188 Other ascites: Secondary | ICD-10-CM | POA: Diagnosis not present

## 2014-11-19 DIAGNOSIS — K2970 Gastritis, unspecified, without bleeding: Secondary | ICD-10-CM | POA: Diagnosis not present

## 2014-11-19 DIAGNOSIS — K580 Irritable bowel syndrome with diarrhea: Secondary | ICD-10-CM | POA: Diagnosis not present

## 2014-11-19 DIAGNOSIS — L89322 Pressure ulcer of left buttock, stage 2: Secondary | ICD-10-CM | POA: Diagnosis not present

## 2014-11-19 DIAGNOSIS — D649 Anemia, unspecified: Secondary | ICD-10-CM | POA: Diagnosis not present

## 2014-11-19 DIAGNOSIS — K922 Gastrointestinal hemorrhage, unspecified: Secondary | ICD-10-CM | POA: Diagnosis not present

## 2014-11-19 DIAGNOSIS — Z794 Long term (current) use of insulin: Secondary | ICD-10-CM | POA: Diagnosis not present

## 2014-11-19 DIAGNOSIS — F418 Other specified anxiety disorders: Secondary | ICD-10-CM | POA: Diagnosis not present

## 2014-11-19 DIAGNOSIS — S58911A Complete traumatic amputation of right forearm, level unspecified, initial encounter: Secondary | ICD-10-CM | POA: Diagnosis not present

## 2014-11-19 DIAGNOSIS — Z89612 Acquired absence of left leg above knee: Secondary | ICD-10-CM | POA: Diagnosis not present

## 2014-11-19 DIAGNOSIS — R1312 Dysphagia, oropharyngeal phase: Secondary | ICD-10-CM | POA: Diagnosis not present

## 2014-11-19 DIAGNOSIS — R52 Pain, unspecified: Secondary | ICD-10-CM | POA: Diagnosis not present

## 2014-11-19 DIAGNOSIS — R112 Nausea with vomiting, unspecified: Secondary | ICD-10-CM | POA: Diagnosis not present

## 2014-11-19 DIAGNOSIS — N186 End stage renal disease: Secondary | ICD-10-CM | POA: Diagnosis not present

## 2014-11-19 NOTE — Telephone Encounter (Signed)
A nurse with [facility] called to give you discharge info about this patient He was released from [facility] hospital on 11-15-14 for gangrene

## 2014-11-20 NOTE — Telephone Encounter (Signed)
I have faxed over requesting the records.

## 2014-11-20 NOTE — Telephone Encounter (Signed)
Please try to obtain records from [facility].

## 2014-11-21 DIAGNOSIS — N186 End stage renal disease: Secondary | ICD-10-CM | POA: Diagnosis not present

## 2014-11-21 DIAGNOSIS — Z992 Dependence on renal dialysis: Secondary | ICD-10-CM | POA: Diagnosis not present

## 2014-11-21 DIAGNOSIS — N2581 Secondary hyperparathyroidism of renal origin: Secondary | ICD-10-CM | POA: Diagnosis not present

## 2014-11-21 DIAGNOSIS — D631 Anemia in chronic kidney disease: Secondary | ICD-10-CM | POA: Diagnosis not present

## 2014-11-23 DIAGNOSIS — N2581 Secondary hyperparathyroidism of renal origin: Secondary | ICD-10-CM | POA: Diagnosis not present

## 2014-11-23 DIAGNOSIS — N186 End stage renal disease: Secondary | ICD-10-CM | POA: Diagnosis not present

## 2014-11-23 DIAGNOSIS — D631 Anemia in chronic kidney disease: Secondary | ICD-10-CM | POA: Diagnosis not present

## 2014-11-23 DIAGNOSIS — Z992 Dependence on renal dialysis: Secondary | ICD-10-CM | POA: Diagnosis not present

## 2014-11-26 DIAGNOSIS — N2581 Secondary hyperparathyroidism of renal origin: Secondary | ICD-10-CM | POA: Diagnosis not present

## 2014-11-26 DIAGNOSIS — N186 End stage renal disease: Secondary | ICD-10-CM | POA: Diagnosis not present

## 2014-11-26 DIAGNOSIS — Z992 Dependence on renal dialysis: Secondary | ICD-10-CM | POA: Diagnosis not present

## 2014-11-26 DIAGNOSIS — D631 Anemia in chronic kidney disease: Secondary | ICD-10-CM | POA: Diagnosis not present

## 2014-11-28 DIAGNOSIS — Z992 Dependence on renal dialysis: Secondary | ICD-10-CM | POA: Diagnosis not present

## 2014-11-28 DIAGNOSIS — D631 Anemia in chronic kidney disease: Secondary | ICD-10-CM | POA: Diagnosis not present

## 2014-11-28 DIAGNOSIS — N2581 Secondary hyperparathyroidism of renal origin: Secondary | ICD-10-CM | POA: Diagnosis not present

## 2014-11-28 DIAGNOSIS — N186 End stage renal disease: Secondary | ICD-10-CM | POA: Diagnosis not present

## 2014-11-30 DIAGNOSIS — D631 Anemia in chronic kidney disease: Secondary | ICD-10-CM | POA: Diagnosis not present

## 2014-11-30 DIAGNOSIS — N2581 Secondary hyperparathyroidism of renal origin: Secondary | ICD-10-CM | POA: Diagnosis not present

## 2014-11-30 DIAGNOSIS — Z992 Dependence on renal dialysis: Secondary | ICD-10-CM | POA: Diagnosis not present

## 2014-11-30 DIAGNOSIS — N186 End stage renal disease: Secondary | ICD-10-CM | POA: Diagnosis not present

## 2014-12-01 DIAGNOSIS — K7460 Unspecified cirrhosis of liver: Secondary | ICD-10-CM | POA: Diagnosis not present

## 2014-12-01 DIAGNOSIS — T8741 Infection of amputation stump, right upper extremity: Secondary | ICD-10-CM | POA: Diagnosis not present

## 2014-12-01 DIAGNOSIS — I8511 Secondary esophageal varices with bleeding: Secondary | ICD-10-CM | POA: Diagnosis not present

## 2014-12-01 DIAGNOSIS — N186 End stage renal disease: Secondary | ICD-10-CM | POA: Diagnosis not present

## 2014-12-01 DIAGNOSIS — I120 Hypertensive chronic kidney disease with stage 5 chronic kidney disease or end stage renal disease: Secondary | ICD-10-CM | POA: Diagnosis not present

## 2014-12-02 DIAGNOSIS — E785 Hyperlipidemia, unspecified: Secondary | ICD-10-CM | POA: Diagnosis not present

## 2014-12-02 DIAGNOSIS — R0602 Shortness of breath: Secondary | ICD-10-CM | POA: Diagnosis not present

## 2014-12-02 DIAGNOSIS — D631 Anemia in chronic kidney disease: Secondary | ICD-10-CM | POA: Diagnosis not present

## 2014-12-02 DIAGNOSIS — R279 Unspecified lack of coordination: Secondary | ICD-10-CM | POA: Diagnosis not present

## 2014-12-02 DIAGNOSIS — R05 Cough: Secondary | ICD-10-CM | POA: Diagnosis not present

## 2014-12-02 DIAGNOSIS — N186 End stage renal disease: Secondary | ICD-10-CM | POA: Diagnosis not present

## 2014-12-02 DIAGNOSIS — K580 Irritable bowel syndrome with diarrhea: Secondary | ICD-10-CM | POA: Diagnosis not present

## 2014-12-02 DIAGNOSIS — R52 Pain, unspecified: Secondary | ICD-10-CM | POA: Diagnosis not present

## 2014-12-02 DIAGNOSIS — L98499 Non-pressure chronic ulcer of skin of other sites with unspecified severity: Secondary | ICD-10-CM | POA: Diagnosis not present

## 2014-12-02 DIAGNOSIS — Z8249 Family history of ischemic heart disease and other diseases of the circulatory system: Secondary | ICD-10-CM | POA: Diagnosis not present

## 2014-12-02 DIAGNOSIS — I10 Essential (primary) hypertension: Secondary | ICD-10-CM | POA: Diagnosis not present

## 2014-12-02 DIAGNOSIS — E119 Type 2 diabetes mellitus without complications: Secondary | ICD-10-CM | POA: Diagnosis not present

## 2014-12-02 DIAGNOSIS — K7460 Unspecified cirrhosis of liver: Secondary | ICD-10-CM | POA: Diagnosis not present

## 2014-12-02 DIAGNOSIS — Z89611 Acquired absence of right leg above knee: Secondary | ICD-10-CM | POA: Diagnosis not present

## 2014-12-02 DIAGNOSIS — M62838 Other muscle spasm: Secondary | ICD-10-CM | POA: Diagnosis not present

## 2014-12-02 DIAGNOSIS — K922 Gastrointestinal hemorrhage, unspecified: Secondary | ICD-10-CM | POA: Diagnosis not present

## 2014-12-02 DIAGNOSIS — L89322 Pressure ulcer of left buttock, stage 2: Secondary | ICD-10-CM | POA: Diagnosis not present

## 2014-12-02 DIAGNOSIS — F418 Other specified anxiety disorders: Secondary | ICD-10-CM | POA: Diagnosis not present

## 2014-12-02 DIAGNOSIS — R601 Generalized edema: Secondary | ICD-10-CM | POA: Diagnosis not present

## 2014-12-02 DIAGNOSIS — Z992 Dependence on renal dialysis: Secondary | ICD-10-CM | POA: Diagnosis not present

## 2014-12-02 DIAGNOSIS — I120 Hypertensive chronic kidney disease with stage 5 chronic kidney disease or end stage renal disease: Secondary | ICD-10-CM | POA: Diagnosis not present

## 2014-12-02 DIAGNOSIS — R40242 Glasgow coma scale score 9-12: Secondary | ICD-10-CM | POA: Diagnosis not present

## 2014-12-02 DIAGNOSIS — Z89201 Acquired absence of right upper limb, unspecified level: Secondary | ICD-10-CM | POA: Diagnosis not present

## 2014-12-02 DIAGNOSIS — I8511 Secondary esophageal varices with bleeding: Secondary | ICD-10-CM | POA: Diagnosis not present

## 2014-12-02 DIAGNOSIS — R7989 Other specified abnormal findings of blood chemistry: Secondary | ICD-10-CM | POA: Diagnosis not present

## 2014-12-02 DIAGNOSIS — R404 Transient alteration of awareness: Secondary | ICD-10-CM | POA: Diagnosis not present

## 2014-12-02 DIAGNOSIS — E1129 Type 2 diabetes mellitus with other diabetic kidney complication: Secondary | ICD-10-CM | POA: Diagnosis not present

## 2014-12-02 DIAGNOSIS — F0390 Unspecified dementia without behavioral disturbance: Secondary | ICD-10-CM | POA: Diagnosis not present

## 2014-12-02 DIAGNOSIS — Z6839 Body mass index (BMI) 39.0-39.9, adult: Secondary | ICD-10-CM | POA: Diagnosis not present

## 2014-12-02 DIAGNOSIS — S58911A Complete traumatic amputation of right forearm, level unspecified, initial encounter: Secondary | ICD-10-CM | POA: Diagnosis not present

## 2014-12-02 DIAGNOSIS — R188 Other ascites: Secondary | ICD-10-CM | POA: Diagnosis not present

## 2014-12-02 DIAGNOSIS — Z89612 Acquired absence of left leg above knee: Secondary | ICD-10-CM | POA: Diagnosis not present

## 2014-12-02 DIAGNOSIS — K529 Noninfective gastroenteritis and colitis, unspecified: Secondary | ICD-10-CM | POA: Diagnosis not present

## 2014-12-02 DIAGNOSIS — D649 Anemia, unspecified: Secondary | ICD-10-CM | POA: Diagnosis not present

## 2014-12-02 DIAGNOSIS — I2510 Atherosclerotic heart disease of native coronary artery without angina pectoris: Secondary | ICD-10-CM | POA: Diagnosis not present

## 2014-12-02 DIAGNOSIS — Z7401 Bed confinement status: Secondary | ICD-10-CM | POA: Diagnosis not present

## 2014-12-02 DIAGNOSIS — Z79899 Other long term (current) drug therapy: Secondary | ICD-10-CM | POA: Diagnosis not present

## 2014-12-02 DIAGNOSIS — Z66 Do not resuscitate: Secondary | ICD-10-CM | POA: Diagnosis not present

## 2014-12-02 DIAGNOSIS — K589 Irritable bowel syndrome without diarrhea: Secondary | ICD-10-CM | POA: Diagnosis not present

## 2014-12-02 DIAGNOSIS — K920 Hematemesis: Secondary | ICD-10-CM | POA: Diagnosis not present

## 2014-12-02 DIAGNOSIS — T8741 Infection of amputation stump, right upper extremity: Secondary | ICD-10-CM | POA: Diagnosis not present

## 2014-12-02 DIAGNOSIS — R748 Abnormal levels of other serum enzymes: Secondary | ICD-10-CM | POA: Diagnosis not present

## 2014-12-02 DIAGNOSIS — R0689 Other abnormalities of breathing: Secondary | ICD-10-CM | POA: Diagnosis not present

## 2014-12-02 DIAGNOSIS — F339 Major depressive disorder, recurrent, unspecified: Secondary | ICD-10-CM | POA: Diagnosis not present

## 2014-12-02 DIAGNOSIS — I509 Heart failure, unspecified: Secondary | ICD-10-CM | POA: Diagnosis not present

## 2014-12-02 DIAGNOSIS — M898X9 Other specified disorders of bone, unspecified site: Secondary | ICD-10-CM | POA: Diagnosis not present

## 2014-12-02 DIAGNOSIS — Z7982 Long term (current) use of aspirin: Secondary | ICD-10-CM | POA: Diagnosis not present

## 2014-12-02 DIAGNOSIS — I739 Peripheral vascular disease, unspecified: Secondary | ICD-10-CM | POA: Diagnosis not present

## 2014-12-02 DIAGNOSIS — R1312 Dysphagia, oropharyngeal phase: Secondary | ICD-10-CM | POA: Diagnosis not present

## 2014-12-04 DIAGNOSIS — L89322 Pressure ulcer of left buttock, stage 2: Secondary | ICD-10-CM | POA: Diagnosis not present

## 2014-12-04 DIAGNOSIS — Z992 Dependence on renal dialysis: Secondary | ICD-10-CM | POA: Diagnosis not present

## 2014-12-04 DIAGNOSIS — I10 Essential (primary) hypertension: Secondary | ICD-10-CM | POA: Diagnosis not present

## 2014-12-04 DIAGNOSIS — I509 Heart failure, unspecified: Secondary | ICD-10-CM | POA: Diagnosis not present

## 2014-12-04 DIAGNOSIS — R52 Pain, unspecified: Secondary | ICD-10-CM | POA: Diagnosis not present

## 2014-12-04 DIAGNOSIS — K922 Gastrointestinal hemorrhage, unspecified: Secondary | ICD-10-CM | POA: Diagnosis not present

## 2014-12-04 DIAGNOSIS — R404 Transient alteration of awareness: Secondary | ICD-10-CM | POA: Diagnosis not present

## 2014-12-04 DIAGNOSIS — I120 Hypertensive chronic kidney disease with stage 5 chronic kidney disease or end stage renal disease: Secondary | ICD-10-CM | POA: Diagnosis not present

## 2014-12-04 DIAGNOSIS — R748 Abnormal levels of other serum enzymes: Secondary | ICD-10-CM | POA: Diagnosis not present

## 2014-12-04 DIAGNOSIS — E119 Type 2 diabetes mellitus without complications: Secondary | ICD-10-CM | POA: Diagnosis not present

## 2014-12-04 DIAGNOSIS — Z89612 Acquired absence of left leg above knee: Secondary | ICD-10-CM | POA: Diagnosis not present

## 2014-12-04 DIAGNOSIS — I739 Peripheral vascular disease, unspecified: Secondary | ICD-10-CM | POA: Diagnosis not present

## 2014-12-04 DIAGNOSIS — K529 Noninfective gastroenteritis and colitis, unspecified: Secondary | ICD-10-CM | POA: Diagnosis not present

## 2014-12-04 DIAGNOSIS — Z66 Do not resuscitate: Secondary | ICD-10-CM | POA: Diagnosis not present

## 2014-12-04 DIAGNOSIS — M62838 Other muscle spasm: Secondary | ICD-10-CM | POA: Diagnosis not present

## 2014-12-04 DIAGNOSIS — N186 End stage renal disease: Secondary | ICD-10-CM | POA: Diagnosis not present

## 2014-12-04 DIAGNOSIS — D631 Anemia in chronic kidney disease: Secondary | ICD-10-CM | POA: Diagnosis not present

## 2014-12-04 DIAGNOSIS — E785 Hyperlipidemia, unspecified: Secondary | ICD-10-CM | POA: Diagnosis not present

## 2014-12-04 DIAGNOSIS — Z89611 Acquired absence of right leg above knee: Secondary | ICD-10-CM | POA: Diagnosis not present

## 2014-12-04 DIAGNOSIS — R40242 Glasgow coma scale score 9-12: Secondary | ICD-10-CM | POA: Diagnosis not present

## 2014-12-04 DIAGNOSIS — N2581 Secondary hyperparathyroidism of renal origin: Secondary | ICD-10-CM | POA: Diagnosis not present

## 2014-12-04 DIAGNOSIS — K580 Irritable bowel syndrome with diarrhea: Secondary | ICD-10-CM | POA: Diagnosis not present

## 2014-12-04 DIAGNOSIS — T8741 Infection of amputation stump, right upper extremity: Secondary | ICD-10-CM | POA: Diagnosis not present

## 2014-12-04 DIAGNOSIS — F339 Major depressive disorder, recurrent, unspecified: Secondary | ICD-10-CM | POA: Diagnosis not present

## 2014-12-04 DIAGNOSIS — Z7401 Bed confinement status: Secondary | ICD-10-CM | POA: Diagnosis not present

## 2014-12-04 DIAGNOSIS — S58911A Complete traumatic amputation of right forearm, level unspecified, initial encounter: Secondary | ICD-10-CM | POA: Diagnosis not present

## 2014-12-04 DIAGNOSIS — Z79899 Other long term (current) drug therapy: Secondary | ICD-10-CM | POA: Diagnosis not present

## 2014-12-04 DIAGNOSIS — Z89201 Acquired absence of right upper limb, unspecified level: Secondary | ICD-10-CM | POA: Diagnosis not present

## 2014-12-04 DIAGNOSIS — Z7982 Long term (current) use of aspirin: Secondary | ICD-10-CM | POA: Diagnosis not present

## 2014-12-04 DIAGNOSIS — I8511 Secondary esophageal varices with bleeding: Secondary | ICD-10-CM | POA: Diagnosis not present

## 2014-12-04 DIAGNOSIS — F418 Other specified anxiety disorders: Secondary | ICD-10-CM | POA: Diagnosis not present

## 2014-12-04 DIAGNOSIS — R0689 Other abnormalities of breathing: Secondary | ICD-10-CM | POA: Diagnosis not present

## 2014-12-04 DIAGNOSIS — R279 Unspecified lack of coordination: Secondary | ICD-10-CM | POA: Diagnosis not present

## 2014-12-04 DIAGNOSIS — R7989 Other specified abnormal findings of blood chemistry: Secondary | ICD-10-CM | POA: Diagnosis not present

## 2014-12-04 DIAGNOSIS — I2510 Atherosclerotic heart disease of native coronary artery without angina pectoris: Secondary | ICD-10-CM | POA: Diagnosis not present

## 2014-12-04 DIAGNOSIS — F0390 Unspecified dementia without behavioral disturbance: Secondary | ICD-10-CM | POA: Diagnosis not present

## 2014-12-04 DIAGNOSIS — D649 Anemia, unspecified: Secondary | ICD-10-CM | POA: Diagnosis not present

## 2014-12-04 DIAGNOSIS — K7460 Unspecified cirrhosis of liver: Secondary | ICD-10-CM | POA: Diagnosis not present

## 2014-12-04 DIAGNOSIS — R1312 Dysphagia, oropharyngeal phase: Secondary | ICD-10-CM | POA: Diagnosis not present

## 2014-12-07 DIAGNOSIS — Z992 Dependence on renal dialysis: Secondary | ICD-10-CM | POA: Diagnosis not present

## 2014-12-07 DIAGNOSIS — N186 End stage renal disease: Secondary | ICD-10-CM | POA: Diagnosis not present

## 2014-12-07 DIAGNOSIS — D631 Anemia in chronic kidney disease: Secondary | ICD-10-CM | POA: Diagnosis not present

## 2014-12-07 DIAGNOSIS — N2581 Secondary hyperparathyroidism of renal origin: Secondary | ICD-10-CM | POA: Diagnosis not present

## 2014-12-10 DIAGNOSIS — N186 End stage renal disease: Secondary | ICD-10-CM | POA: Diagnosis not present

## 2014-12-10 DIAGNOSIS — Z992 Dependence on renal dialysis: Secondary | ICD-10-CM | POA: Diagnosis not present

## 2014-12-10 DIAGNOSIS — N2581 Secondary hyperparathyroidism of renal origin: Secondary | ICD-10-CM | POA: Diagnosis not present

## 2014-12-10 DIAGNOSIS — D631 Anemia in chronic kidney disease: Secondary | ICD-10-CM | POA: Diagnosis not present

## 2014-12-27 ENCOUNTER — Ambulatory Visit: Payer: Medicare Other | Admitting: Family Medicine

## 2015-01-09 DEATH — deceased

## 2016-05-11 IMAGING — CR DG ANKLE COMPLETE 3+V*L*
3 series · 3 of 3 positions shown · non-contrast
Comparison: None.

CLINICAL DATA: Diabetic wound

EXAM:
LEFT ANKLE COMPLETE - 3+ VIEW

[x ankle ap left]
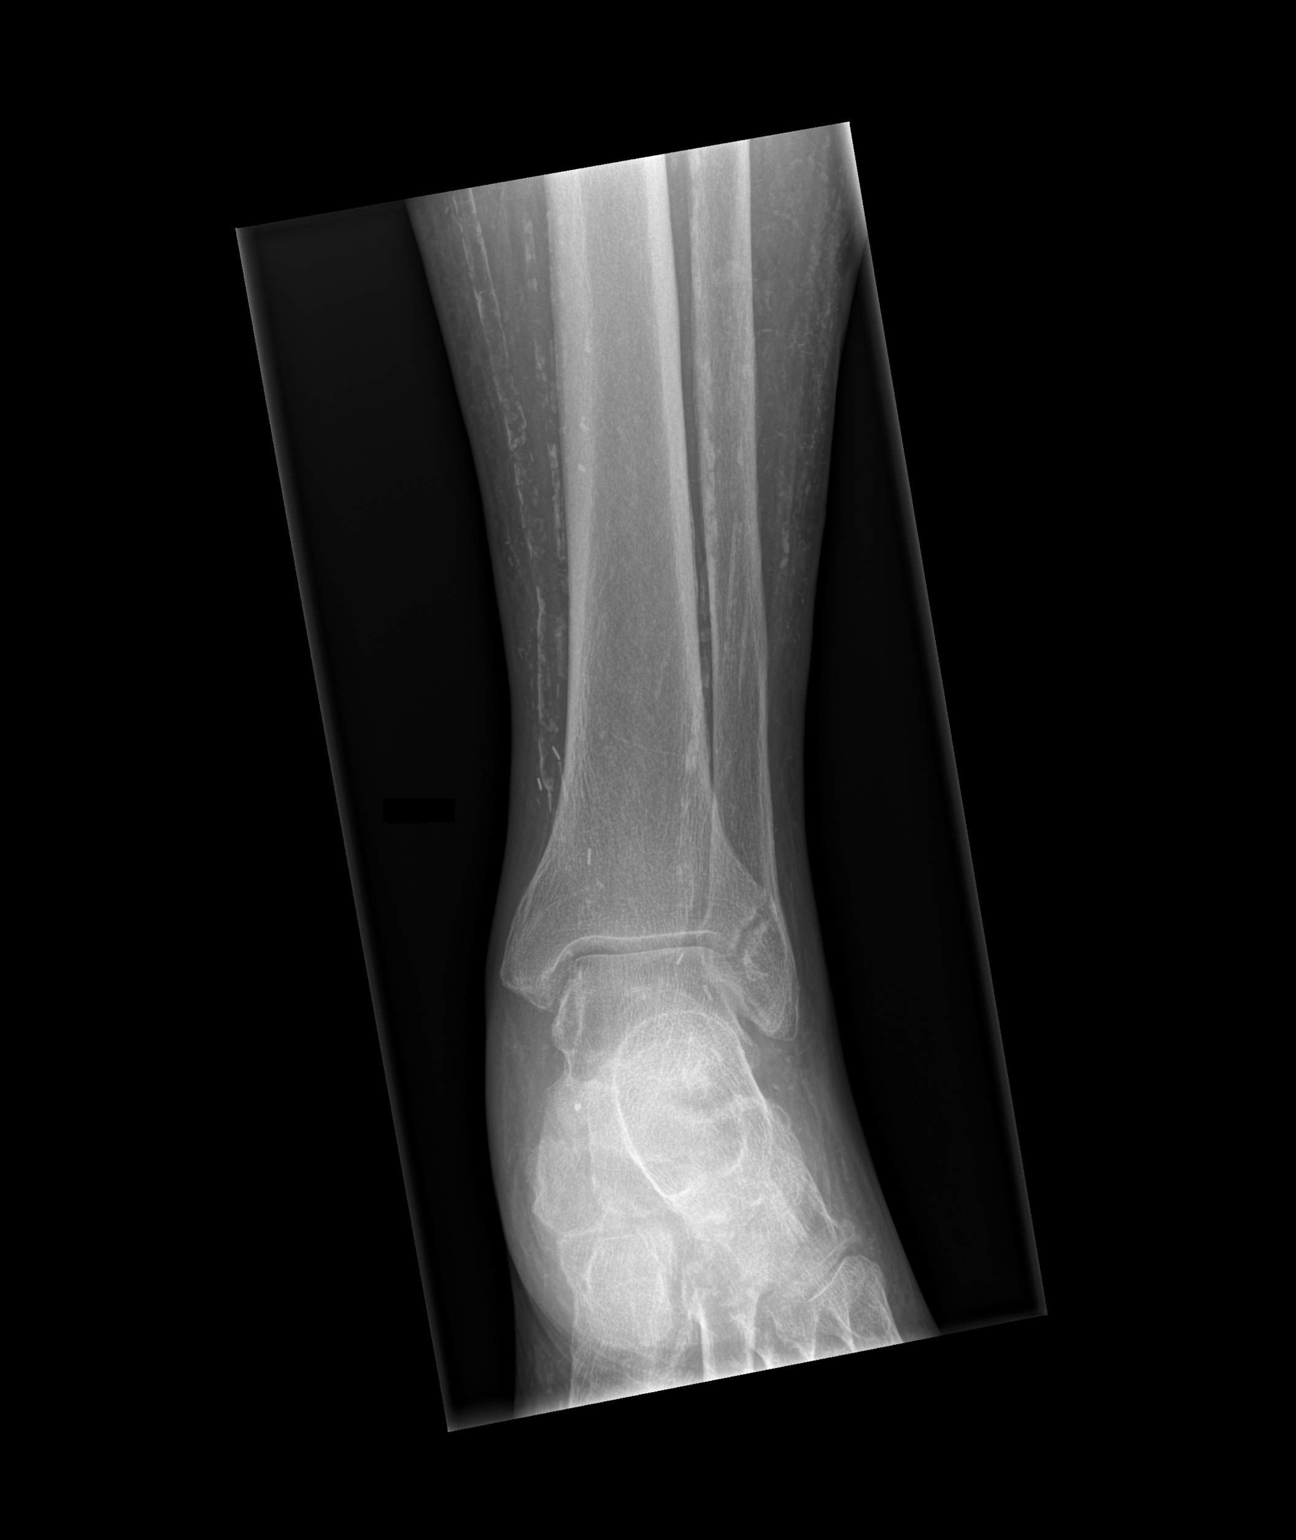

[x ankle obl left]
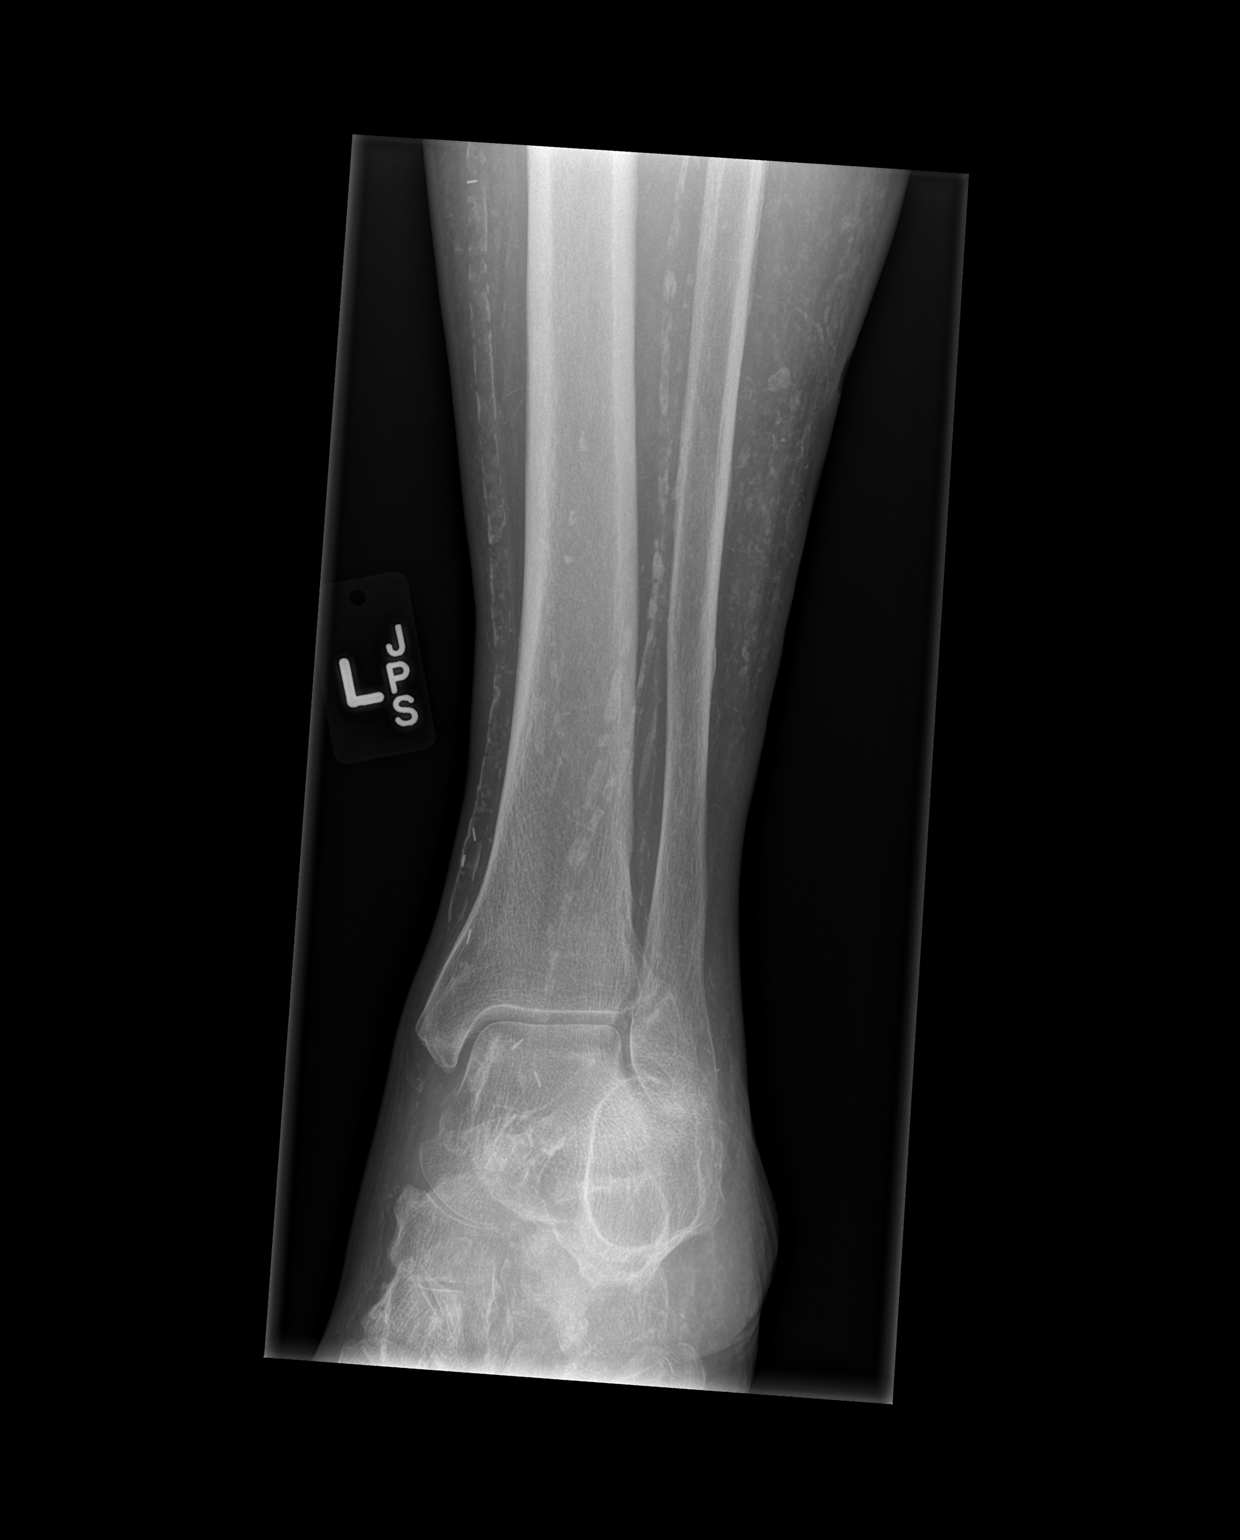

[x ankle lat left]
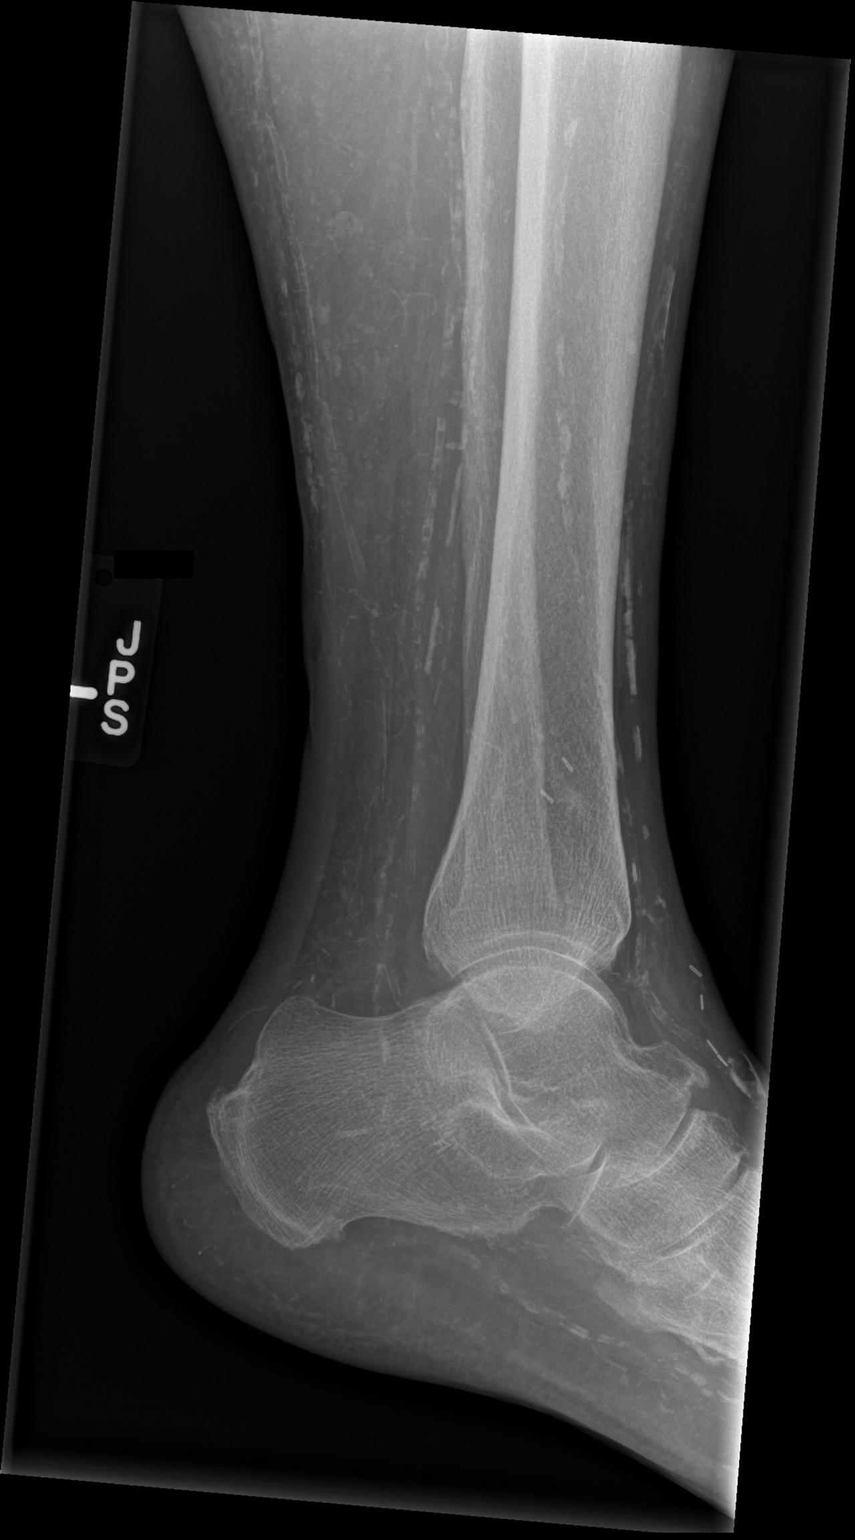

[3 of 3 positions shown; findings below may reference images not displayed]

FINDINGS: Ankle joint intact. Negative for fracture. No evidence of
osteomyelitis.

Ossification in the interosseous ligament distally.

Advanced arterial calcification.
IMPRESSION: Negative for fracture or osteomyelitis.

## 2017-01-19 IMAGING — DX DG LUMBAR SPINE COMPLETE 4+V
5 series · 5 of 5 positions shown · non-contrast
Comparison: Lumbar spine radiographs 02/24/2013

CLINICAL DATA: Fall.  Low back pain.  Right hand swelling.

EXAM:
LUMBAR SPINE - COMPLETE 4+ VIEW

[l-spine ap]
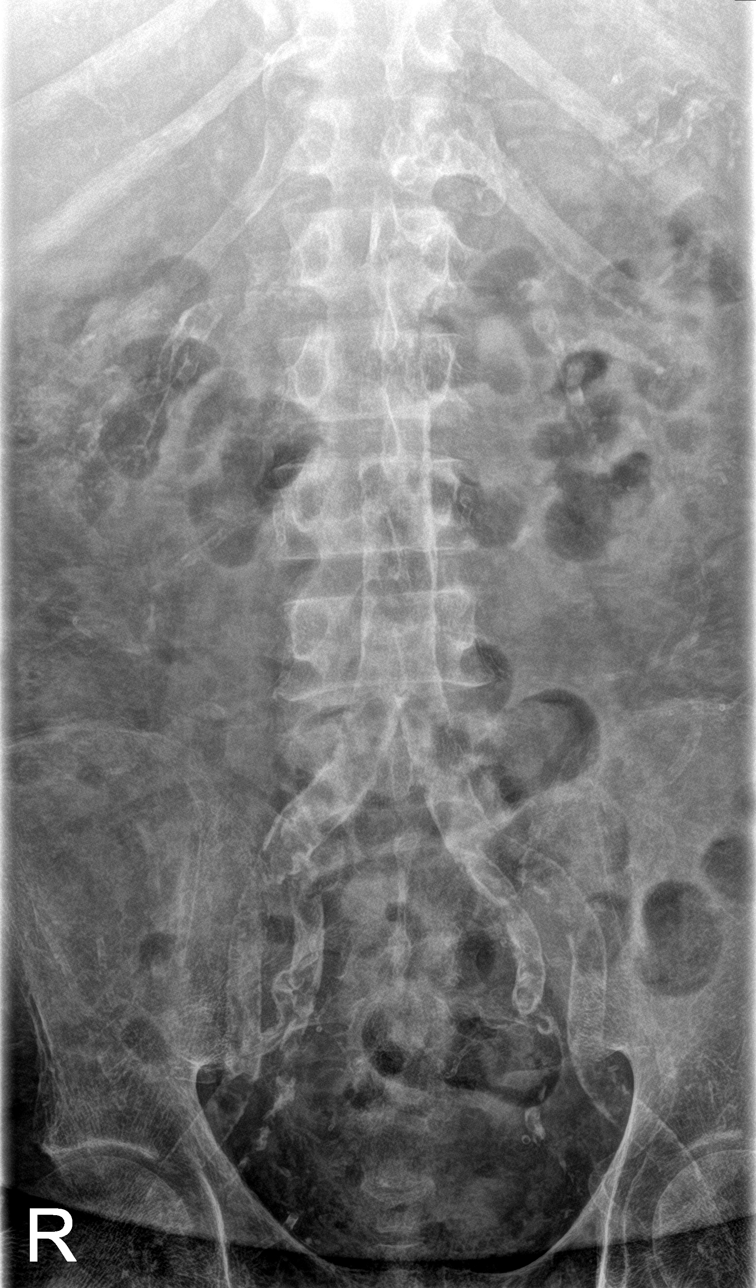

[l-spine obl (1 of 2)]
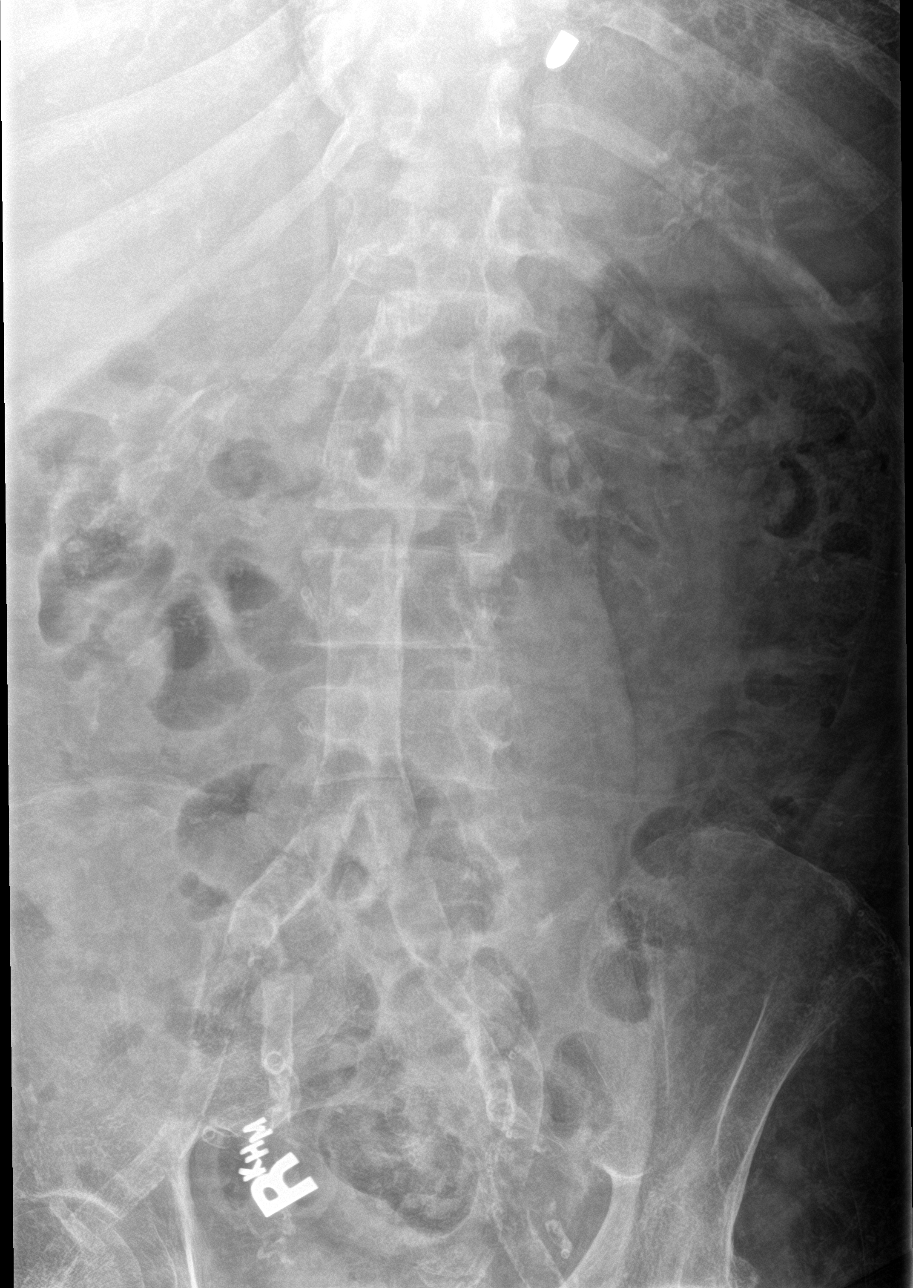

[l-spine lat]
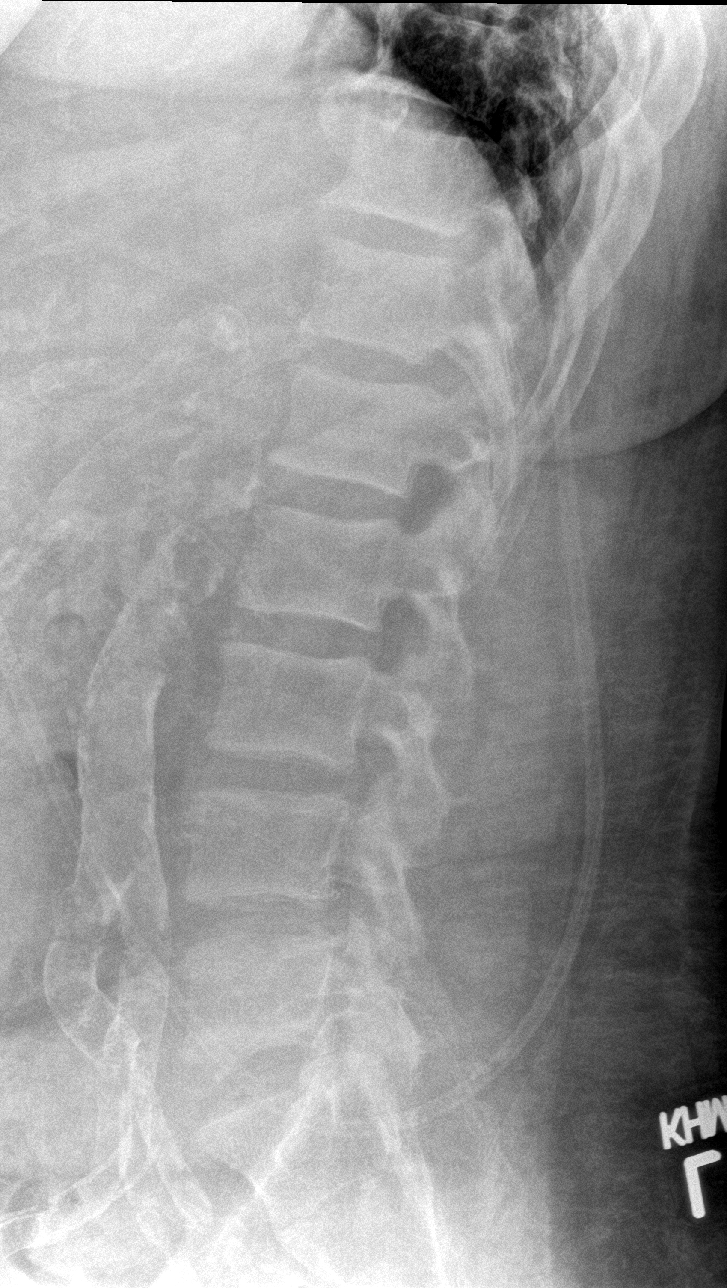

[l-spine spot]
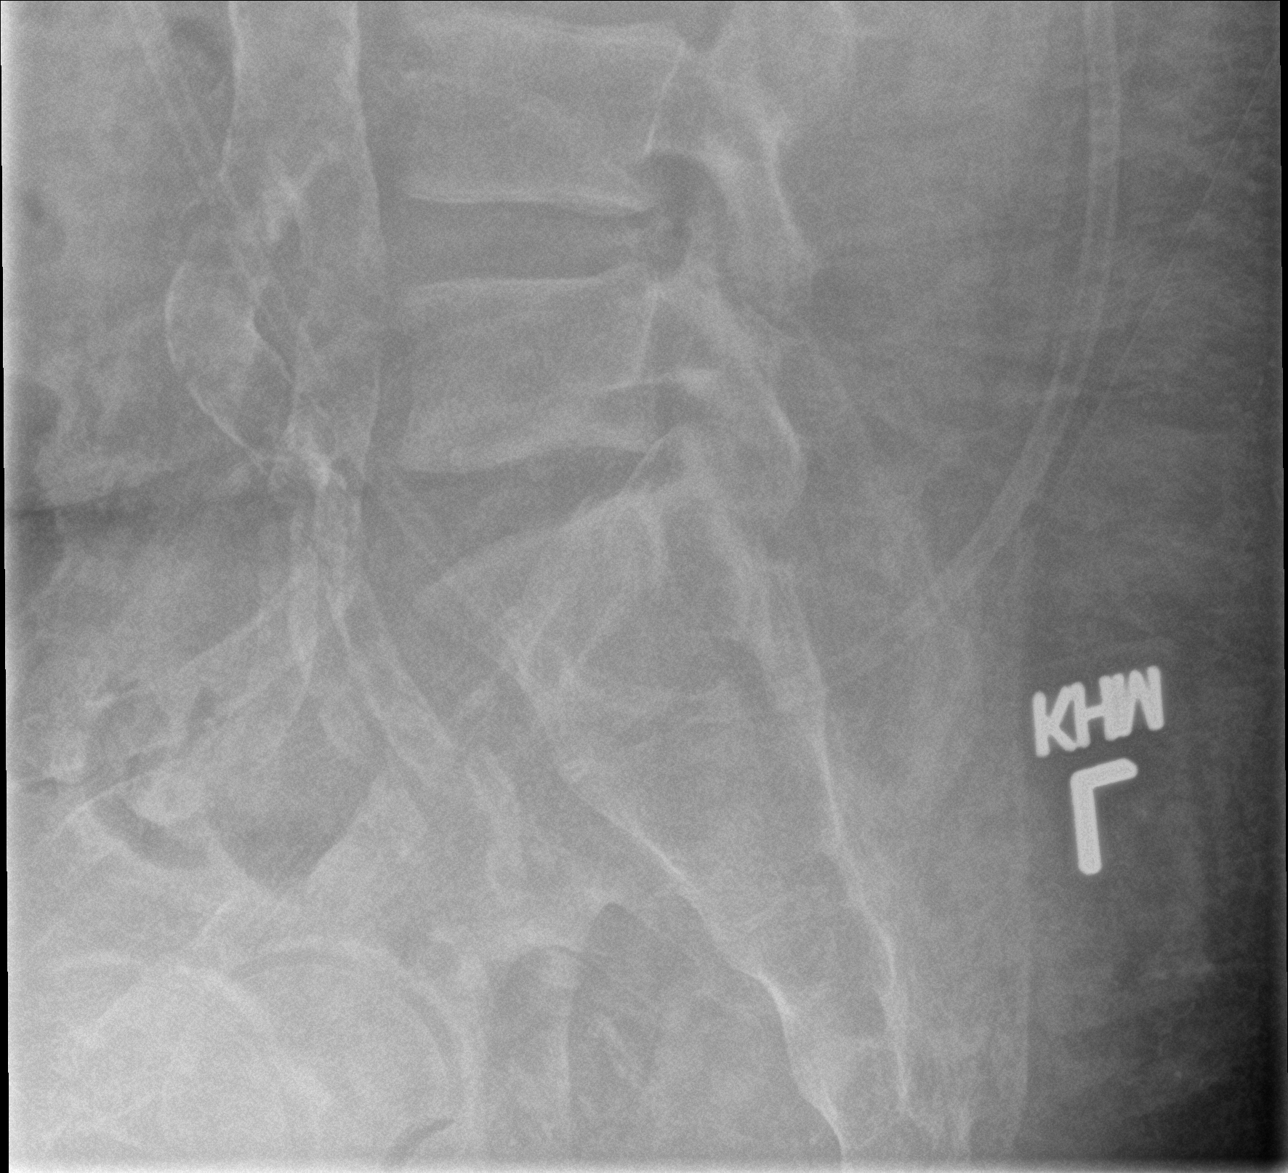

[l-spine obl (2 of 2)]
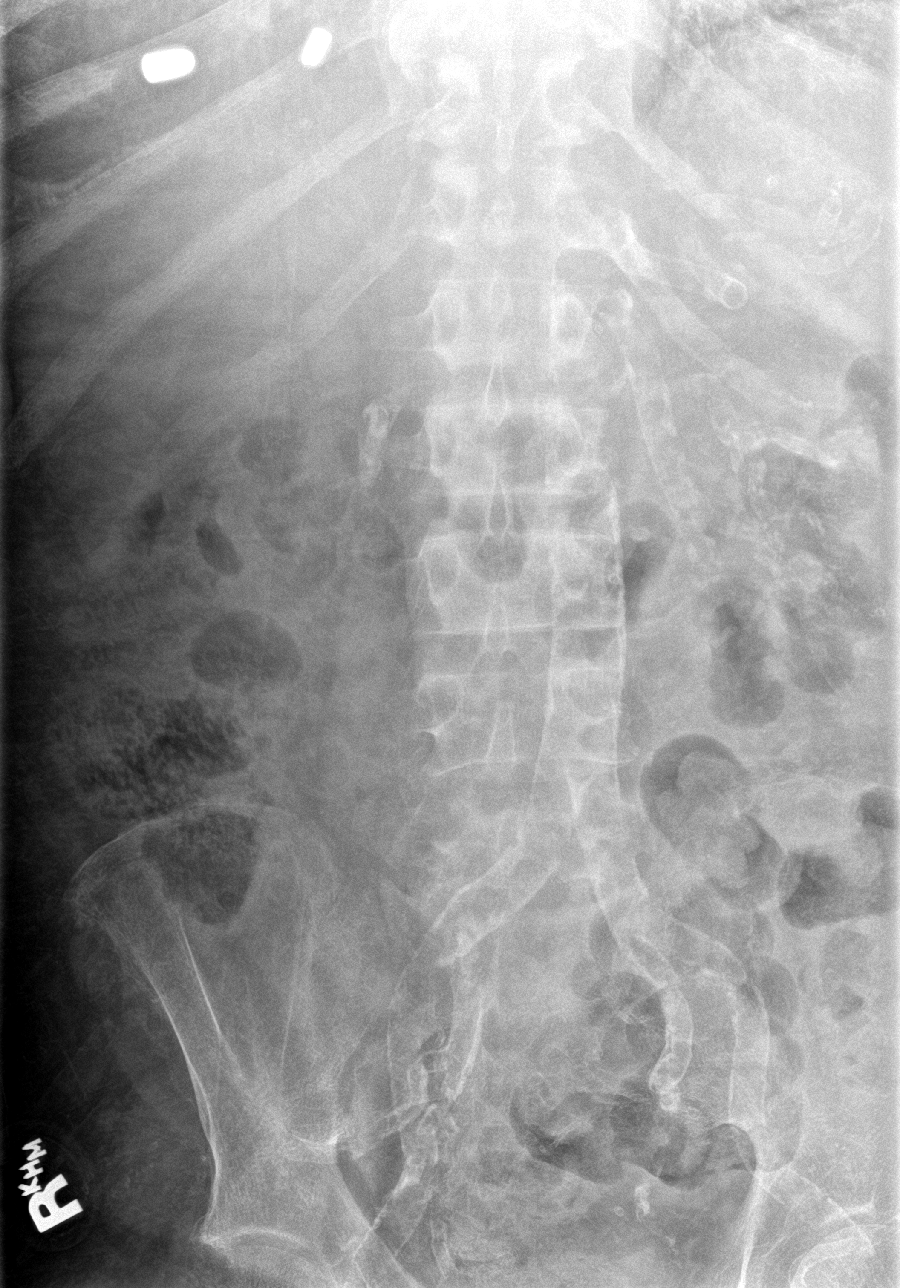

[5 of 5 positions shown; findings below may reference images not displayed]

FINDINGS: 5 non rib-bearing lumbar type vertebral bodies are present. Moderate
osteopenia is noted. Vertebral body heights and alignment are
maintained. Extensive atherosclerotic calcifications are present in
the aorta and branch vessels without aneurysm. The bowel gas pattern
is unremarkable. The pelvis is intact.
IMPRESSION: 1. No acute or focal abnormality of the lumbar spine.
2. Extensive atherosclerotic disease without evidence for aneurysm.

## 2017-06-07 ENCOUNTER — Encounter: Payer: Self-pay | Admitting: Internal Medicine

## 2021-08-31 ENCOUNTER — Other Ambulatory Visit (HOSPITAL_COMMUNITY): Payer: Self-pay
